# Patient Record
Sex: Male | Born: 1950
Health system: Southern US, Community
[De-identification: ages and names within clinical notes are randomized; demographics above are authoritative.]

## PROBLEM LIST (undated history)

## (undated) DIAGNOSIS — R918 Other nonspecific abnormal finding of lung field: Secondary | ICD-10-CM

## (undated) DIAGNOSIS — I10 Essential (primary) hypertension: Secondary | ICD-10-CM

## (undated) DIAGNOSIS — I5022 Chronic systolic (congestive) heart failure: Secondary | ICD-10-CM

## (undated) DIAGNOSIS — E8881 Metabolic syndrome: Secondary | ICD-10-CM

## (undated) DIAGNOSIS — I480 Paroxysmal atrial fibrillation: Secondary | ICD-10-CM

## (undated) DIAGNOSIS — C61 Malignant neoplasm of prostate: Secondary | ICD-10-CM

## (undated) DIAGNOSIS — Z9989 Dependence on other enabling machines and devices: Secondary | ICD-10-CM

## (undated) DIAGNOSIS — G4733 Obstructive sleep apnea (adult) (pediatric): Secondary | ICD-10-CM

## (undated) DIAGNOSIS — D696 Thrombocytopenia, unspecified: Secondary | ICD-10-CM

## (undated) DIAGNOSIS — I509 Heart failure, unspecified: Secondary | ICD-10-CM

## (undated) HISTORY — DX: Malignant neoplasm of prostate: C61

## (undated) HISTORY — DX: Metabolic syndrome: E88.81

## (undated) HISTORY — DX: Other nonspecific abnormal finding of lung field: R91.8

## (undated) HISTORY — DX: Thrombocytopenia, unspecified: D69.6

## (undated) HISTORY — DX: Chronic systolic (congestive) heart failure: I50.22

## (undated) HISTORY — DX: Paroxysmal atrial fibrillation: I48.0

## (undated) HISTORY — PX: PROSTATE BIOPSY: SHX241

---

## 2000-04-24 ENCOUNTER — Ambulatory Visit (HOSPITAL_BASED_OUTPATIENT_CLINIC_OR_DEPARTMENT_OTHER): Admission: RE | Admit: 2000-04-24 | Discharge: 2000-04-24 | Payer: Self-pay | Admitting: Nephrology

## 2002-06-20 ENCOUNTER — Ambulatory Visit (HOSPITAL_BASED_OUTPATIENT_CLINIC_OR_DEPARTMENT_OTHER): Admission: RE | Admit: 2002-06-20 | Discharge: 2002-06-20 | Payer: Self-pay | Admitting: Plastic Surgery

## 2015-04-15 ENCOUNTER — Ambulatory Visit (INDEPENDENT_AMBULATORY_CARE_PROVIDER_SITE_OTHER): Payer: 59 | Admitting: Interventional Cardiology

## 2015-04-15 ENCOUNTER — Encounter: Payer: Self-pay | Admitting: Interventional Cardiology

## 2015-04-15 VITALS — BP 100/68 | HR 66 | Ht 70.0 in | Wt 241.0 lb

## 2015-04-15 DIAGNOSIS — C61 Malignant neoplasm of prostate: Secondary | ICD-10-CM

## 2015-04-15 DIAGNOSIS — R918 Other nonspecific abnormal finding of lung field: Secondary | ICD-10-CM

## 2015-04-15 DIAGNOSIS — Z8249 Family history of ischemic heart disease and other diseases of the circulatory system: Secondary | ICD-10-CM | POA: Insufficient documentation

## 2015-04-15 DIAGNOSIS — I5042 Chronic combined systolic (congestive) and diastolic (congestive) heart failure: Secondary | ICD-10-CM | POA: Diagnosis not present

## 2015-04-15 DIAGNOSIS — I340 Nonrheumatic mitral (valve) insufficiency: Secondary | ICD-10-CM

## 2015-04-15 DIAGNOSIS — I5022 Chronic systolic (congestive) heart failure: Secondary | ICD-10-CM | POA: Diagnosis not present

## 2015-04-15 DIAGNOSIS — I509 Heart failure, unspecified: Secondary | ICD-10-CM | POA: Insufficient documentation

## 2015-04-15 DIAGNOSIS — I480 Paroxysmal atrial fibrillation: Secondary | ICD-10-CM | POA: Diagnosis not present

## 2015-04-15 DIAGNOSIS — E8881 Metabolic syndrome: Secondary | ICD-10-CM

## 2015-04-15 DIAGNOSIS — D696 Thrombocytopenia, unspecified: Secondary | ICD-10-CM

## 2015-04-15 DIAGNOSIS — G4733 Obstructive sleep apnea (adult) (pediatric): Secondary | ICD-10-CM

## 2015-04-15 HISTORY — DX: Chronic systolic (congestive) heart failure: I50.22

## 2015-04-15 HISTORY — DX: Metabolic syndrome: E88.81

## 2015-04-15 HISTORY — DX: Paroxysmal atrial fibrillation: I48.0

## 2015-04-15 HISTORY — DX: Other nonspecific abnormal finding of lung field: R91.8

## 2015-04-15 HISTORY — DX: Malignant neoplasm of prostate: C61

## 2015-04-15 HISTORY — DX: Thrombocytopenia, unspecified: D69.6

## 2015-04-15 NOTE — Patient Instructions (Addendum)
Medication Instructions:  Your physician recommends that you continue on your current medications as directed. Please refer to the Current Medication list given to you today.   Labwork: Your physician recommends that you return for lab work on 04/20/14 (Bmet, Cbc,Pt/Inr)  Testing/Procedures: Your physician has requested that you have a cardiac catheterization. Cardiac catheterization is used to diagnose and/or treat various heart conditions. Doctors may recommend this procedure for a number of different reasons. The most common reason is to evaluate chest pain. Chest pain can be a symptom of coronary artery disease (CAD), and cardiac catheterization can show whether plaque is narrowing or blocking your heart's arteries. This procedure is also used to evaluate the valves, as well as measure the blood flow and oxygen levels in different parts of your heart. For further information please visit [email or web address]. Please follow instruction sheet, as given.    Follow-Up: You have a follow up appointment scheduled on 05/22/15 @ 10am  Any Other Special Instructions Will Be Listed Below (If Applicable).     If you need a refill on your cardiac medications before your next appointment, please call your pharmacy.

## 2015-04-15 NOTE — Progress Notes (Signed)
Cardiology Office Note   Date:  04/15/2015   ID:  Logan [name], DOB Oct 10, 1950, MRN [id number]  PCP:  [name], MD  Cardiologist:  Sinclair Grooms, MD   Chief Complaint  Patient presents with  . Congestive Heart Failure      History of Present Illness: Logan [name] is a 65 y.o. male who presents for  Longitudinal follow-up of congestive heart failure and switch over from cardiologist at [facility]  In [place].   The patient gives a history of congestive heart failure since 2014 when he had his first episode of abdominal fullness and shortness of breath. Evaluation was done at that time and there was concern about prior unrecognized myocardial infarction. Medical therapy was started in the patient did well for a year and half until November 2016 when he again  Developed abdominal fullness and dyspnea on exertion. He was hospitalized at [facility].  Medical therapy was adjusted and he was placed on a diuretic , furosemide, and now feels back to normal. He is switching his follow-up because he had  Difficulties communicating with his prior cardiologist. He has never had a heart attack that he is aware of. Reviewing records from [place] demonstrated EF 25-30% with evidence by echo of inferior akinesis. A myocardial perfusion study in 2014 suggested that the anterior wall was akinetic an EF 25%.   He denies orthopnea, PND, exertional chest pain, syncope, palpitations, and transient neurological symptoms.   He is on Eliquis therapy and has a history of paroxysmal atrial fibrillation. He never recalls any episodes of tachycardia or palpitations. He has never had a cardioversion.  The available records do not clearly specify when atrial fibrillation recurred.    No past medical history on file.  No past surgical history on file.   Current Outpatient Prescriptions  Medication Sig Dispense Refill  . carvedilol (COREG) 12.5 MG tablet Take 12.5 mg by mouth 2  (two) times daily.    . Cholecalciferol ([employer]S VIT D 5000 HIGH-POTENCY) 5000 units capsule Take 5,000 Units by mouth daily.    . furosemide (LASIX) 40 MG tablet Take 40 mg by mouth daily.    . Multiple Vitamin (MULTIVITAMIN) tablet Take 1 tablet by mouth daily.    . rivaroxaban (XARELTO) 20 MG TABS tablet Take 20 mg by mouth daily.    . sacubitril-valsartan (ENTRESTO) 24-26 MG Take 1 tablet by mouth 2 (two) times daily.    Marland Kitchen spironolactone (ALDACTONE) 25 MG tablet Take 12.5 mg by mouth daily.     No current facility-administered medications for this visit.    Allergies:   Review of patient's allergies indicates no known allergies.    Social History:  The patient  reports that he quit smoking about 41 years ago. His smoking use included Cigarettes, Cigars, and Pipe. He has never used smokeless tobacco.   Family History:  The patient's family history includes Hypertension in his father; Parkinson's disease in his mother.    ROS:  Please see the history of present illness.   Otherwise, review of systems are positive for no major complaints other than distressed of his medical regimen. Frightened her have coronary angiogram performed. Also get off of Xarelto. Does not know the circumstances under which atrial fibrillation was diagnosed..   All other systems are reviewed and negative.    PHYSICAL EXAM: VS:  BP 100/68 mmHg  Pulse 66  Ht '5\' 10"'$  (1.778 m)  Wt 241 lb (109.317 kg)  BMI 34.58 kg/m2 , BMI  Body mass index is 34.58 kg/(m^2). GEN: Well nourished, well developed, in no acute distress HEENT: normal Neck: no JVD, carotid bruits, or masses Cardiac: RRR.  There is a 1-2 of 6 apical systolic murmur and an S4 gallop. There is no edema. Respiratory:  clear to auscultation bilaterally, normal work of breathing. GI: soft, nontender, nondistended, + BS MS: no deformity or atrophy Skin: warm and dry, no rash Neuro:  Strength and sensation are intact Psych: euthymic mood, full  affect   EKG:  EKG is ordered today. The ekg reveals NSR with of and V6. No old tracings are available for review.   Recent Labs: No results found for requested labs within last 365 days.    Lipid Panel No results found for: CHOL, TRIG, HDL, CHOLHDL, VLDL, LDLCALC, LDLDIRECT    Wt Readings from Last 3 Encounters:  04/15/15 241 lb (109.317 kg)      Other studies Reviewed: Additional studies/ records that were reviewed today include[place] The findings include confirmation of chronic systolic heart failure, initial presentation 2015, acute on chronic exacerbation in November 2016. At that time atrial fibrillation was also diagnosed. He did not have cardioversion. Anticoagulation therapy with Xarelto was started. Interest and was also started.. Catheterizations been recommended but always refused by the patient. Strong suggestion from records that he may have an ischemic cardiomyopathy as a nuclear study in 2015 demonstrated anterior wall scar. More recent echo suggested inferior wall akinesis.    ASSESSMENT AND PLAN:  1. Chronic systolic heart failure ([place]) Data from outlying hospital suggests the possibility of large anterolateral apical infarction/scar by nuclear scintigraphy in 2015 and a more recent echocardiogram suggesting inferior akinesis by echo. All studies demonstrate EF between 30 and 35%.  2. Paroxysmal atrial fibrillation ([place]) Diagnosed in November 2016 and anticoagulation therapy started. No strips to confirm.  3. Prostate cancer ([place]) Watchful waiting  4. Thrombocytopenia ([place]) Followed by oncology  5. Mitral regurgitation, myxomatous Diagnosed on echo  6. Lung mass Watchful waiting  7. Obstructive sleep apnea C Pap  8. Metabolic syndrome Being treated with diet and exercise  9. CHF (congestive heart failure), NYHA class I, chronic, combined ([place])  - EKG 64-QIHK - Basic metabolic panel; Future - CBC  w/Diff; Future - INR/PT; Future    Current medicines are reviewed at length with the patient today.  The patient has the following concerns regarding medicines: we discussed the patient medications and what the specific indications are for each cardioactive therapy. Entresto, carvedilol, , Xarelto, spironolactone,and furosemide were discussed.  The following changes/actions have been instituted:    Left and right heart catheterization with coronary angiography was recommended.The patient was counseled to undergo left heart catheterization, coronary angiography, and possible percutaneous coronary intervention with stent implantation. The procedural risks and benefits were discussed in detail. The risks discussed included death, stroke, myocardial infarction, life-threatening bleeding, limb ischemia, kidney injury, allergy, and possible emergency cardiac surgery. The risk of these significant complications were estimated to occur less than 1% of the time. After discussion, the patient has agreed to proceed.  If LVEF less than 35%, consideration of resynchronization therapy/AICD. Functional status is quite good as were probably not yet a candidate for device therapy.  Discuss prognosis after results of Her known.  Labs/ tests ordered today include:  No orders of the defined types were placed in this encounter.     Disposition:   FU with HS in 1 month  Signed, Sinclair Grooms, MD  04/15/2015  10:14 AM    [address], [place], [place]  [address] Phone: [phone]; Fax: [phone]

## 2015-04-16 ENCOUNTER — Other Ambulatory Visit: Payer: Self-pay | Admitting: Interventional Cardiology

## 2015-04-16 DIAGNOSIS — I5022 Chronic systolic (congestive) heart failure: Secondary | ICD-10-CM

## 2015-04-21 ENCOUNTER — Other Ambulatory Visit (INDEPENDENT_AMBULATORY_CARE_PROVIDER_SITE_OTHER): Payer: 59

## 2015-04-21 DIAGNOSIS — I5042 Chronic combined systolic (congestive) and diastolic (congestive) heart failure: Secondary | ICD-10-CM

## 2015-04-21 LAB — BASIC METABOLIC PANEL
BUN: 18 mg/dL (ref 7–25)
CALCIUM: 9.4 mg/dL (ref 8.6–10.3)
CO2: 24 mmol/L (ref 20–31)
CREATININE: 1.04 mg/dL (ref 0.70–1.25)
Chloride: 104 mmol/L (ref 98–110)
Glucose, Bld: 97 mg/dL (ref 65–99)
Potassium: 4.1 mmol/L (ref 3.5–5.3)
Sodium: 139 mmol/L (ref 135–146)

## 2015-04-21 LAB — CBC WITH DIFFERENTIAL/PLATELET
BASOS ABS: 0 10*3/uL (ref 0.0–0.1)
Basophils Relative: 0 % (ref 0–1)
EOS PCT: 0 % (ref 0–5)
Eosinophils Absolute: 0 10*3/uL (ref 0.0–0.7)
HEMATOCRIT: 39 % (ref 39.0–52.0)
Hemoglobin: 13.4 g/dL (ref 13.0–17.0)
Lymphocytes Relative: 55 % — ABNORMAL HIGH (ref 12–46)
Lymphs Abs: 2.9 10*3/uL (ref 0.7–4.0)
MCH: 30.3 pg (ref 26.0–34.0)
MCHC: 34.4 g/dL (ref 30.0–36.0)
MCV: 88.2 fL (ref 78.0–100.0)
MPV: 10.1 fL (ref 8.6–12.4)
Monocytes Absolute: 0.4 10*3/uL (ref 0.1–1.0)
Monocytes Relative: 7 % (ref 3–12)
NEUTROS PCT: 38 % — AB (ref 43–77)
Neutro Abs: 2 10*3/uL (ref 1.7–7.7)
PLATELETS: 137 10*3/uL — AB (ref 150–400)
RBC: 4.42 MIL/uL (ref 4.22–5.81)
RDW: 15.1 % (ref 11.5–15.5)
WBC: 5.3 10*3/uL (ref 4.0–10.5)

## 2015-04-22 LAB — PROTIME-INR
INR: 1.51 — ABNORMAL HIGH (ref <1.50)
Prothrombin Time: 18.4 seconds — ABNORMAL HIGH (ref 11.6–15.2)

## 2015-04-27 ENCOUNTER — Encounter (HOSPITAL_COMMUNITY)
Admission: RE | Disposition: A | Discharge status: Home / Self Care | Payer: Self-pay | Source: Ambulatory Visit | Attending: Interventional Cardiology

## 2015-04-27 ENCOUNTER — Ambulatory Visit (HOSPITAL_COMMUNITY)
Admission: RE | Admit: 2015-04-27 | Discharge: 2015-04-27 | Disposition: A | Discharge status: Home / Self Care | Payer: 59 | Source: Ambulatory Visit | Attending: Interventional Cardiology | Admitting: Interventional Cardiology

## 2015-04-27 ENCOUNTER — Encounter (HOSPITAL_COMMUNITY): Payer: Self-pay | Admitting: Interventional Cardiology

## 2015-04-27 DIAGNOSIS — I5022 Chronic systolic (congestive) heart failure: Secondary | ICD-10-CM | POA: Diagnosis present

## 2015-04-27 DIAGNOSIS — I255 Ischemic cardiomyopathy: Secondary | ICD-10-CM | POA: Insufficient documentation

## 2015-04-27 DIAGNOSIS — Z87891 Personal history of nicotine dependence: Secondary | ICD-10-CM | POA: Insufficient documentation

## 2015-04-27 DIAGNOSIS — R9439 Abnormal result of other cardiovascular function study: Secondary | ICD-10-CM

## 2015-04-27 DIAGNOSIS — I2510 Atherosclerotic heart disease of native coronary artery without angina pectoris: Secondary | ICD-10-CM | POA: Diagnosis not present

## 2015-04-27 DIAGNOSIS — E8881 Metabolic syndrome: Secondary | ICD-10-CM | POA: Diagnosis not present

## 2015-04-27 DIAGNOSIS — C61 Malignant neoplasm of prostate: Secondary | ICD-10-CM | POA: Insufficient documentation

## 2015-04-27 DIAGNOSIS — R918 Other nonspecific abnormal finding of lung field: Secondary | ICD-10-CM | POA: Insufficient documentation

## 2015-04-27 DIAGNOSIS — Z8249 Family history of ischemic heart disease and other diseases of the circulatory system: Secondary | ICD-10-CM | POA: Diagnosis not present

## 2015-04-27 DIAGNOSIS — I480 Paroxysmal atrial fibrillation: Secondary | ICD-10-CM | POA: Diagnosis not present

## 2015-04-27 DIAGNOSIS — Z7901 Long term (current) use of anticoagulants: Secondary | ICD-10-CM | POA: Insufficient documentation

## 2015-04-27 DIAGNOSIS — I252 Old myocardial infarction: Secondary | ICD-10-CM | POA: Diagnosis not present

## 2015-04-27 DIAGNOSIS — D696 Thrombocytopenia, unspecified: Secondary | ICD-10-CM | POA: Insufficient documentation

## 2015-04-27 DIAGNOSIS — I340 Nonrheumatic mitral (valve) insufficiency: Secondary | ICD-10-CM | POA: Diagnosis present

## 2015-04-27 DIAGNOSIS — R931 Abnormal findings on diagnostic imaging of heart and coronary circulation: Secondary | ICD-10-CM | POA: Diagnosis not present

## 2015-04-27 DIAGNOSIS — G4733 Obstructive sleep apnea (adult) (pediatric): Secondary | ICD-10-CM | POA: Insufficient documentation

## 2015-04-27 HISTORY — PX: CARDIAC CATHETERIZATION: SHX172

## 2015-04-27 LAB — POCT I-STAT 3, ART BLOOD GAS (G3+)
Acid-base deficit: 3 mmol/L — ABNORMAL HIGH (ref 0.0–2.0)
BICARBONATE: 22.8 meq/L (ref 20.0–24.0)
O2 Saturation: 98 %
PCO2 ART: 42.9 mmHg (ref 35.0–45.0)
PH ART: 7.334 — AB (ref 7.350–7.450)
TCO2: 24 mmol/L (ref 0–100)
pO2, Arterial: 119 mmHg — ABNORMAL HIGH (ref 80.0–100.0)

## 2015-04-27 LAB — POCT I-STAT 3, VENOUS BLOOD GAS (G3P V)
ACID-BASE DEFICIT: 2 mmol/L (ref 0.0–2.0)
BICARBONATE: 23.7 meq/L (ref 20.0–24.0)
O2 SAT: 74 %
PO2 VEN: 41 mmHg (ref 30.0–45.0)
TCO2: 25 mmol/L (ref 0–100)
pCO2, Ven: 43.3 mmHg — ABNORMAL LOW (ref 45.0–50.0)
pH, Ven: 7.347 — ABNORMAL HIGH (ref 7.250–7.300)

## 2015-04-27 SURGERY — RIGHT/LEFT HEART CATH AND CORONARY ANGIOGRAPHY
Anesthesia: LOCAL

## 2015-04-27 MED ORDER — ASPIRIN 81 MG PO CHEW: 81.0000 mg | CHEWABLE_TABLET | Freq: Every day | ORAL | Status: DC

## 2015-04-27 MED ORDER — FENTANYL CITRATE (PF) 100 MCG/2ML IJ SOLN
INTRAMUSCULAR | Status: AC
Start: 2015-04-27 — End: 2015-04-27
  Filled 2015-04-27: qty 2

## 2015-04-27 MED ORDER — MIDAZOLAM HCL 2 MG/2ML IJ SOLN
INTRAMUSCULAR | Status: AC
  Filled 2015-04-27: qty 2

## 2015-04-27 MED ORDER — OXYCODONE-ACETAMINOPHEN 5-325 MG PO TABS: 1.0000 | ORAL_TABLET | ORAL | Status: DC | PRN

## 2015-04-27 MED ORDER — SODIUM CHLORIDE 0.9 % IV SOLN
INTRAVENOUS | Status: DC
  Administered 2015-04-27: 07:00:00 via INTRAVENOUS

## 2015-04-27 MED ORDER — SODIUM CHLORIDE 0.9 % IV SOLN: 250.0000 mL | INTRAVENOUS | Status: DC | PRN

## 2015-04-27 MED ORDER — ACETAMINOPHEN 325 MG PO TABS: 650.0000 mg | ORAL_TABLET | ORAL | Status: DC | PRN

## 2015-04-27 MED ORDER — FENTANYL CITRATE (PF) 100 MCG/2ML IJ SOLN
INTRAMUSCULAR | Status: DC | PRN
  Administered 2015-04-27 (×2): 25 ug via INTRAVENOUS

## 2015-04-27 MED ORDER — HEPARIN SODIUM (PORCINE) 1000 UNIT/ML IJ SOLN
INTRAMUSCULAR | Status: DC | PRN
  Administered 2015-04-27: 5000 [IU] via INTRAVENOUS

## 2015-04-27 MED ORDER — SODIUM CHLORIDE 0.9% FLUSH: 3.0000 mL | INTRAVENOUS | Status: DC | PRN

## 2015-04-27 MED ORDER — LIDOCAINE HCL (PF) 1 % IJ SOLN
INTRAMUSCULAR | Status: DC | PRN
  Administered 2015-04-27: 08:00:00

## 2015-04-27 MED ORDER — HEPARIN (PORCINE) IN NACL 2-0.9 UNIT/ML-% IJ SOLN
INTRAMUSCULAR | Status: AC
  Filled 2015-04-27: qty 1000

## 2015-04-27 MED ORDER — MIDAZOLAM HCL 2 MG/2ML IJ SOLN
INTRAMUSCULAR | Status: DC | PRN
  Administered 2015-04-27 (×2): 1 mg via INTRAVENOUS

## 2015-04-27 MED ORDER — ONDANSETRON HCL 4 MG/2ML IJ SOLN: 4.0000 mg | Freq: Four times a day (QID) | INTRAMUSCULAR | Status: DC | PRN

## 2015-04-27 MED ORDER — SODIUM CHLORIDE 0.9% FLUSH: 3.0000 mL | Freq: Two times a day (BID) | INTRAVENOUS | Status: DC

## 2015-04-27 MED ORDER — IOHEXOL 350 MG/ML SOLN
INTRAVENOUS | Status: DC | PRN
  Administered 2015-04-27: 80 mL via INTRA_ARTERIAL

## 2015-04-27 MED ORDER — ASPIRIN 81 MG PO CHEW
CHEWABLE_TABLET | ORAL | Status: AC
  Filled 2015-04-27: qty 1

## 2015-04-27 MED ORDER — VERAPAMIL HCL 2.5 MG/ML IV SOLN: INTRAVENOUS | Status: DC | PRN

## 2015-04-27 MED ORDER — VERAPAMIL HCL 2.5 MG/ML IV SOLN
INTRAVENOUS | Status: AC
  Filled 2015-04-27: qty 2

## 2015-04-27 MED ORDER — ASPIRIN 81 MG PO CHEW
81.0000 mg | CHEWABLE_TABLET | ORAL | Status: AC
  Administered 2015-04-27: 81 mg via ORAL

## 2015-04-27 MED ORDER — LIDOCAINE HCL (PF) 1 % IJ SOLN
INTRAMUSCULAR | Status: AC
  Filled 2015-04-27: qty 30

## 2015-04-27 MED ORDER — SODIUM CHLORIDE 0.9 % WEIGHT BASED INFUSION: 1.0000 mL/kg/h | INTRAVENOUS | Status: AC

## 2015-04-27 SURGICAL SUPPLY — 13 items
CATH BALLN WEDGE 5F 110CM (CATHETERS) ×2 IMPLANT
CATH INFINITI 5 FR JL3.5 (CATHETERS) ×2 IMPLANT
CATH INFINITI JR4 5F (CATHETERS) ×2 IMPLANT
DEVICE RAD COMP TR BAND LRG (VASCULAR PRODUCTS) ×2 IMPLANT
GLIDESHEATH SLEND A-KIT 6F 22G (SHEATH) ×2 IMPLANT
GUIDEWIRE .025 260CM (WIRE) ×2 IMPLANT
KIT HEART LEFT (KITS) ×2 IMPLANT
KIT HEART RIGHT NAMIC (KITS) ×2 IMPLANT
PACK CARDIAC CATHETERIZATION (CUSTOM PROCEDURE TRAY) ×2 IMPLANT
SHEATH FAST CATH BRACH 5F 5CM (SHEATH) ×2 IMPLANT
TRANSDUCER W/STOPCOCK (MISCELLANEOUS) ×2 IMPLANT
TUBING CIL FLEX 10 FLL-RA (TUBING) ×2 IMPLANT
WIRE SAFE-T 1.5MM-J .035X260CM (WIRE) ×2 IMPLANT

## 2015-04-27 NOTE — Discharge Instructions (Signed)
Radial Site Care °Refer to this sheet in the next few weeks. These instructions provide you with information about caring for yourself after your procedure. Your health care provider may also give you more specific instructions. Your treatment has been planned according to current medical practices, but problems sometimes occur. Call your health care provider if you have any problems or questions after your procedure. °WHAT TO EXPECT AFTER THE PROCEDURE °After your procedure, it is typical to have the following: °· Bruising at the radial site that usually fades within 1-2 weeks. °· Blood collecting in the tissue (hematoma) that may be painful to the touch. It should usually decrease in size and tenderness within 1-2 weeks. °HOME CARE INSTRUCTIONS °· Take medicines only as directed by your health care provider. °· You may shower 24-48 hours after the procedure or as directed by your health care provider. Remove the bandage (dressing) and gently wash the site with plain soap and water. Pat the area dry with a clean towel. Do not rub the site, because this may cause bleeding. °· Do not take baths, swim, or use a hot tub until your health care provider approves. °· Check your insertion site every day for redness, swelling, or drainage. °· Do not apply powder or lotion to the site. °· Do not flex or bend the affected arm for 24 hours or as directed by your health care provider. °· Do not push or pull heavy objects with the affected arm for 24 hours or as directed by your health care provider. °· Do not lift over 10 lb (4.5 kg) for 5 days after your procedure or as directed by your health care provider. °· Ask your health care provider when it is okay to: °¨ Return to work or school. °¨ Resume usual physical activities or sports. °¨ Resume sexual activity. °· Do not drive home if you are discharged the same day as the procedure. Have someone else drive you. °· You may drive 24 hours after the procedure unless otherwise  instructed by your health care provider. °· Do not operate machinery or power tools for 24 hours after the procedure. °· If your procedure was done as an outpatient procedure, which means that you went home the same day as your procedure, a responsible adult should be with you for the first 24 hours after you arrive home. °· Keep all follow-up visits as directed by your health care provider. This is important. °SEEK MEDICAL CARE IF: °· You have a fever. °· You have chills. °· You have increased bleeding from the radial site. Hold pressure on the site. CALL 911 °SEEK IMMEDIATE MEDICAL CARE IF: °· You have unusual pain at the radial site. °· You have redness, warmth, or swelling at the radial site. °· You have drainage (other than a small amount of blood on the dressing) from the radial site. °· The radial site is bleeding, and the bleeding does not stop after 30 minutes of holding steady pressure on the site. °· Your arm or hand becomes pale, cool, tingly, or numb. °  °This information is not intended to replace advice given to you by your health care provider. Make sure you discuss any questions you have with your health care provider. °  °Document Released: 04/02/2010 Document Revised: 03/21/2014 Document Reviewed: 09/16/2013 °Elsevier Interactive Patient Education ©2016 Elsevier Inc. ° °

## 2015-04-27 NOTE — H&P (View-Only) (Signed)
Cardiology Office Note   Date:  04/15/2015   ID:  Logan [name], DOB [birth year], MRN [id number]  PCP:  [name], MD  Cardiologist:  Sinclair Grooms, MD   Chief Complaint  Patient presents with  . Congestive Heart Failure      History of Present Illness: Logan [name] is a 65 y.o. male who presents for  Longitudinal follow-up of congestive heart failure and switch over from cardiologist at [facility]  In [place].   The patient gives a history of congestive heart failure since 2014 when he had his first episode of abdominal fullness and shortness of breath. Evaluation was done at that time and there was concern about prior unrecognized myocardial infarction. Medical therapy was started in the patient did well for a year and half until November 2016 when he again  Developed abdominal fullness and dyspnea on exertion. He was hospitalized at [facility].  Medical therapy was adjusted and he was placed on a diuretic , furosemide, and now feels back to normal. He is switching his follow-up because he had  Difficulties communicating with his prior cardiologist. He has never had a heart attack that he is aware of. Reviewing records from [place] demonstrated EF 25-30% with evidence by echo of inferior akinesis. A myocardial perfusion study in 2014 suggested that the anterior wall was akinetic an EF 25%.   He denies orthopnea, PND, exertional chest pain, syncope, palpitations, and transient neurological symptoms.   He is on Eliquis therapy and has a history of paroxysmal atrial fibrillation. He never recalls any episodes of tachycardia or palpitations. He has never had a cardioversion.  The available records do not clearly specify when atrial fibrillation recurred.    No past medical history on file.  No past surgical history on file.   Current Outpatient Prescriptions  Medication Sig Dispense Refill  . carvedilol (COREG) 12.5 MG tablet Take 12.5 mg by mouth 2  (two) times daily.    . Cholecalciferol (CVS VIT D 5000 HIGH-POTENCY) 5000 units capsule Take 5,000 Units by mouth daily.    . furosemide (LASIX) 40 MG tablet Take 40 mg by mouth daily.    . Multiple Vitamin (MULTIVITAMIN) tablet Take 1 tablet by mouth daily.    . rivaroxaban (XARELTO) 20 MG TABS tablet Take 20 mg by mouth daily.    . sacubitril-valsartan (ENTRESTO) 24-26 MG Take 1 tablet by mouth 2 (two) times daily.    Marland Kitchen spironolactone (ALDACTONE) 25 MG tablet Take 12.5 mg by mouth daily.     No current facility-administered medications for this visit.    Allergies:   Review of patient's allergies indicates no known allergies.    Social History:  The patient  reports that he quit smoking about 41 years ago. His smoking use included Cigarettes, Cigars, and Pipe. He has never used smokeless tobacco.   Family History:  The patient's family history includes Hypertension in his father; Parkinson's disease in his mother.    ROS:  Please see the history of present illness.   Otherwise, review of systems are positive for no major complaints other than distressed of his medical regimen. Frightened her have coronary angiogram performed. Also get off of Xarelto. Does not know the circumstances under which atrial fibrillation was diagnosed..   All other systems are reviewed and negative.    PHYSICAL EXAM: VS:  BP 100/68 mmHg  Pulse 66  Ht '5\' 10"'$  (1.778 m)  Wt 241 lb (109.317 kg)  BMI 34.58 kg/m2 , BMI  Body mass index is 34.58 kg/(m^2). GEN: Well nourished, well developed, in no acute distress HEENT: normal Neck: no JVD, carotid bruits, or masses Cardiac: RRR.  There is a 1-2 of 6 apical systolic murmur and an S4 gallop. There is no edema. Respiratory:  clear to auscultation bilaterally, normal work of breathing. GI: soft, nontender, nondistended, + BS MS: no deformity or atrophy Skin: warm and dry, no rash Neuro:  Strength and sensation are intact Psych: euthymic mood, full  affect   EKG:  EKG is ordered today. The ekg reveals NSR with of and V6. No old tracings are available for review.   Recent Labs: No results found for requested labs within last 365 days.    Lipid Panel No results found for: CHOL, TRIG, HDL, CHOLHDL, VLDL, LDLCALC, LDLDIRECT    Wt Readings from Last 3 Encounters:  04/15/15 241 lb (109.317 kg)      Other studies Reviewed: Additional studies/ records that were reviewed today include[place] The findings include confirmation of chronic systolic heart failure, initial presentation 2015, acute on chronic exacerbation in November 2016. At that time atrial fibrillation was also diagnosed. He did not have cardioversion. Anticoagulation therapy with Xarelto was started. Interest and was also started.. Catheterizations been recommended but always refused by the patient. Strong suggestion from records that he may have an ischemic cardiomyopathy as a nuclear study in 2015 demonstrated anterior wall scar. More recent echo suggested inferior wall akinesis.    ASSESSMENT AND PLAN:  1. Chronic systolic heart failure ([place]) Data from outlying hospital suggests the possibility of large anterolateral apical infarction/scar by nuclear scintigraphy in 2015 and a more recent echocardiogram suggesting inferior akinesis by echo. All studies demonstrate EF between 30 and 35%.  2. Paroxysmal atrial fibrillation ([place]) Diagnosed in November 2016 and anticoagulation therapy started. No strips to confirm.  3. Prostate cancer ([place]) Watchful waiting  4. Thrombocytopenia ([place]) Followed by oncology  5. Mitral regurgitation, myxomatous Diagnosed on echo  6. Lung mass Watchful waiting  7. Obstructive sleep apnea C Pap  8. Metabolic syndrome Being treated with diet and exercise  9. CHF (congestive heart failure), NYHA class I, chronic, combined ([place])  - EKG 88-CZYS - Basic metabolic panel; Future - CBC  w/Diff; Future - INR/PT; Future    Current medicines are reviewed at length with the patient today.  The patient has the following concerns regarding medicines: we discussed the patient medications and what the specific indications are for each cardioactive therapy. Entresto, carvedilol, , Xarelto, spironolactone,and furosemide were discussed.  The following changes/actions have been instituted:    Left and right heart catheterization with coronary angiography was recommended.The patient was counseled to undergo left heart catheterization, coronary angiography, and possible percutaneous coronary intervention with stent implantation. The procedural risks and benefits were discussed in detail. The risks discussed included death, stroke, myocardial infarction, life-threatening bleeding, limb ischemia, kidney injury, allergy, and possible emergency cardiac surgery. The risk of these significant complications were estimated to occur less than 1% of the time. After discussion, the patient has agreed to proceed.  If LVEF less than 35%, consideration of resynchronization therapy/AICD. Functional status is quite good as were probably not yet a candidate for device therapy.  Discuss prognosis after results of Her known.  Labs/ tests ordered today include:  No orders of the defined types were placed in this encounter.     Disposition:   FU with HS in 1 month  Signed, Sinclair Grooms, MD  04/15/2015  10:14 AM    [address] [place], [place],   [address] Phone: [phone]; Fax: [phone]

## 2015-04-27 NOTE — Interval H&P Note (Signed)
Cath Lab Visit (complete for each Cath Lab visit)  Clinical Evaluation Leading to the Procedure:   ACS: No.  Non-ACS:    Anginal Classification: CCS III  Anti-ischemic medical therapy: Maximal Therapy (2 or more classes of medications)  Non-Invasive Test Results: High-risk stress test findings: cardiac mortality >3%/year  Prior CABG: No previous CABG      History and Physical Interval Note:  04/27/2015 7:16 AM  Logan [name]  has presented today for surgery, with the diagnosis of ischemic cardiomyopathy  The various methods of treatment have been discussed with the patient and family. After consideration of risks, benefits and other options for treatment, the patient has consented to  Procedure(s): Right/Left Heart Cath and Coronary Angiography (N/A) as a surgical intervention .  The patient's history has been reviewed, patient examined, no change in status, stable for surgery.  I have reviewed the patient's chart and labs.  Questions were answered to the patient's satisfaction.     Sinclair Grooms

## 2015-04-28 MED FILL — Verapamil HCl IV Soln 2.5 MG/ML: INTRAVENOUS | Qty: 2 | Status: AC

## 2015-05-04 ENCOUNTER — Telehealth: Payer: Self-pay | Admitting: Interventional Cardiology

## 2015-05-04 MED ORDER — SACUBITRIL-VALSARTAN 24-26 MG PO TABS: 1.0000 | ORAL_TABLET | Freq: Two times a day (BID) | ORAL | Status: DC

## 2015-05-04 NOTE — Telephone Encounter (Signed)
New message     Pt wife calling for RN but she educated to me that she needs samples: he also needs prior Authorization for medication  Patient calling the office for samples of medication:   1.  What medication and dosage are you requesting samples for? Entresto 24-'26mg'$    2.  Are you currently out of this medication? yes

## 2015-05-04 NOTE — Telephone Encounter (Signed)
Rx sent to pt pharmacy for [facility]

## 2015-05-06 ENCOUNTER — Telehealth: Payer: Self-pay

## 2015-05-06 NOTE — Telephone Encounter (Signed)
After speaking with patient's wife, I found out it is not a PA he needs. They are already enrolled in [facility], but can't get the medication. Spoke with a rep at [employer], who told me they were having a problem processing the cards. It is an IT problem, and they are working to resolve it. Provided patient with 2 boxes of samples of Entresto 24-26.

## 2015-05-15 ENCOUNTER — Telehealth: Payer: Self-pay | Admitting: Interventional Cardiology

## 2015-05-15 NOTE — Telephone Encounter (Signed)
New message      Calling to get prior authorization for his enstresto

## 2015-05-18 NOTE — Telephone Encounter (Signed)
Not needed. He is enrolled in [facility].

## 2015-05-22 ENCOUNTER — Encounter: Payer: Self-pay | Admitting: Interventional Cardiology

## 2015-05-22 ENCOUNTER — Ambulatory Visit (INDEPENDENT_AMBULATORY_CARE_PROVIDER_SITE_OTHER): Payer: 59 | Admitting: Interventional Cardiology

## 2015-05-22 VITALS — BP 90/60 | HR 66 | Ht 70.0 in | Wt 240.0 lb

## 2015-05-22 DIAGNOSIS — I5022 Chronic systolic (congestive) heart failure: Secondary | ICD-10-CM | POA: Diagnosis not present

## 2015-05-22 DIAGNOSIS — C61 Malignant neoplasm of prostate: Secondary | ICD-10-CM

## 2015-05-22 DIAGNOSIS — I429 Cardiomyopathy, unspecified: Secondary | ICD-10-CM

## 2015-05-22 DIAGNOSIS — I480 Paroxysmal atrial fibrillation: Secondary | ICD-10-CM | POA: Diagnosis not present

## 2015-05-22 DIAGNOSIS — I428 Other cardiomyopathies: Secondary | ICD-10-CM

## 2015-05-22 DIAGNOSIS — Z7901 Long term (current) use of anticoagulants: Secondary | ICD-10-CM | POA: Insufficient documentation

## 2015-05-22 NOTE — Progress Notes (Signed)
Cardiology Office Note   Date:  05/22/2015   ID:  Logan [name], DOB 07-01-1950, MRN [id number]  PCP:  [name], MD  Cardiologist:  Sinclair Grooms, MD   Chief Complaint  Patient presents with  . Congestive Heart Failure      History of Present Illness: Logan [name] is a 65 y.o. male who presents for Nonischemic cardiomyopathy, LVEF 25-30%, elevated LVEDP, elevated troponin, paroxysmal atrial fibrillation, and chronic anticoagulation therapy.  Rider had coronary angiography and has widely patent vessels. There been the question of whether cardiomyopathy was related to ischemic heart disease but it is not. Since his decompensation in November, he has done well on the current medical regimen. He denies orthopnea. There has been some weight gain. He has no peripheral edema. He denies chest pain. No palpitations or syncope. No bleeding on Eliquis.  Past Medical History  Diagnosis Date  . Chronic systolic heart failure ([place]) 04/15/2015     EF 30-35% by echo 2016 , [facility]   . Lung mass 04/15/2015     Never biopsied. History of PET scan that raised concern.   . Obstructive sleep apnea 04/15/2015  . Paroxysmal atrial fibrillation ([place]) 04/15/2015    On apixaban   . Prostate cancer ([place]) 04/15/2015  . Thrombocytopenia ([place]) 04/15/2015  . Metabolic syndrome 10/12/173    Past Surgical History  Procedure Laterality Date  . Prostate biopsy    . Cardiac catheterization N/A 04/27/2015    Procedure: Right/Left Heart Cath and Coronary Angiography;  Surgeon: [name], MD;  Location: [facility];  Service: Cardiovascular;  Laterality: N/A;     Current Outpatient Prescriptions  Medication Sig Dispense Refill  . aspirin 81 MG tablet Take 81 mg by mouth daily.    . carvedilol (COREG) 12.5 MG tablet Take 12.5 mg by mouth 2 (two) times daily.    . Cholecalciferol (CVS VIT D 5000 HIGH-POTENCY) 5000 units capsule Take 5,000 Units by mouth daily.    . furosemide (LASIX)  40 MG tablet Take 40 mg by mouth daily.    . Multiple Vitamin (MULTIVITAMIN) tablet Take 1 tablet by mouth daily.    . rivaroxaban (XARELTO) 20 MG TABS tablet Take 20 mg by mouth daily.    . sacubitril-valsartan (ENTRESTO) 24-26 MG Take 1 tablet by mouth 2 (two) times daily. 60 tablet 0  . spironolactone (ALDACTONE) 12.5 mg TABS tablet Take 12.5 mg by mouth daily.     No current facility-administered medications for this visit.    Allergies:   Review of patient's allergies indicates no known allergies.    Social History:  The patient  reports that he quit smoking about 41 years ago. His smoking use included Cigarettes, Cigars, and Pipe. He has never used smokeless tobacco.   Family History:  The patient's family history includes Hypertension in his father; Parkinson's disease in his mother.    ROS:  Please see the history of present illness.   Otherwise, review of systems are positive for erectile dysfunction.   All other systems are reviewed and negative.    PHYSICAL EXAM: VS:  BP 90/60 mmHg  Pulse 66  Ht '5\' 10"'$  (1.778 m)  Wt 240 lb (108.863 kg)  BMI 34.44 kg/m2 , BMI Body mass index is 34.44 kg/(m^2). GEN: Well nourished, well developed, in no acute distress HEENT: normal Neck: no JVD, carotid bruits, or masses Cardiac: RRR.  There is no murmur, rub, or gallop. There is no edema. Respiratory:  clear  to auscultation bilaterally, normal work of breathing. GI: soft, nontender, nondistended, + BS MS: no deformity or atrophy Skin: warm and dry, no rash Neuro:  Strength and sensation are intact Psych: euthymic mood, full affect   EKG:  EKG is not ordered today.   Recent Labs: 04/21/2015: BUN 18; Creat 1.04; Hemoglobin 13.4; Platelets 137*; Potassium 4.1; Sodium 139    Lipid Panel No results found for: CHOL, TRIG, HDL, CHOLHDL, VLDL, LDLCALC, LDLDIRECT    Wt Readings from Last 3 Encounters:  05/22/15 240 lb (108.863 kg)  04/27/15 232 lb (105.235 kg)  04/15/15 241 lb  (109.317 kg)      Other studies Reviewed: Additional studies/ records that were reviewed today include: Discussed the implications of recent cardiac catheterization with the patient.. The findings include nonischemic cardiomyopathy and final conclusions noted below:. Left and right heart cath with coronary angiography: Conclusion    1. Mid LAD to Dist LAD lesion, 20% stenosed. 2. Mid RCA to Dist RCA lesion, 20% stenosed. 3. There is moderate to severe left ventricular systolic dysfunction.   Widely patent coronary arteries  Severe global hypokinesis with EF 25%. Elevated left ventricular end-diastolic pressure  Normal pulmonary artery pressures.  RECOMMENDATIONS:   No evidence of coronary artery disease  Aggressive medical therapy for left ventricular systolic dysfunction.      ASSESSMENT AND PLAN:  1. Chronic systolic congestive heart failure (HCC) Stable without evidence of volume excess.  2. Paroxysmal atrial fibrillation (HCC) Asymptomatic.  3. Chronic anticoagulation On therapy with Eliquis  4. Nonischemic cardiomyopathy Recent cath with widely patent coronaries  5. Prostate cancer ([place]) Elevated PSA which he is choosing to follow clinically rather than have active therapy.    Current medicines are reviewed at length with the patient today.  The patient has the following concerns regarding medicines: None other than the desire to decrease the number of agents that he is required to take.  The following changes/actions have been instituted:    EP evaluation to consider AICD  Advanced heart failure clinic evaluation to widen the scope of his management for future considerations.  Advised the patient to limit sodium intake  Advised the patient to protest patent moderate aerobic activity 3-4 times per week for at least 20 minutes perception  Advised the patient to wait daily under the same circumstances and track data with a call if sudden 3-5 pound  increase in weight.  Remove aspirin from medication list.  Labs/ tests ordered today include:  No orders of the defined types were placed in this encounter.     Disposition:   FU with HS in 6 months  Signed, Sinclair Grooms, MD  05/22/2015 10:30 AM    [facility], [place], [place]  [address] Phone: [phone]; Fax: [phone]

## 2015-05-22 NOTE — Patient Instructions (Signed)
Medication Instructions:  Your physician recommends that you continue on your current medications as directed. Please refer to the Current Medication list given to you today.   Labwork: NONE ORDERED  Testing/Procedures: NONE ORDERED  Follow-Up:  Your physician recommends that you schedule a follow-up appointment in: 1-2 MONTHS WITH EP TO DISCUSS POSSIBLE DIFIBILATOR  Your physician recommends that you schedule a follow-up appointment in: 2-3 MONTHS TO Jane Your physician wants you to follow-up in: [place] DR. Gaspar Bidding will receive a reminder letter in the mail two months in advance. If you don't receive a letter, please call our office to schedule the follow-up appointment.     Any Other Special Instructions Will Be Listed Below (If Applicable).     If you need a refill on your cardiac medications before your next appointment, please call your pharmacy.

## 2015-06-08 ENCOUNTER — Telehealth: Payer: Self-pay | Admitting: Interventional Cardiology

## 2015-06-08 NOTE — Telephone Encounter (Signed)
New Message:    She said to let you know he is having problems with the Entresto.

## 2015-06-09 NOTE — Telephone Encounter (Signed)
Patient is still having a problem getting Entresto at local pharmacy through [facility]. 3 bottles of Entresto 24-26 provided to him. I will call [facility] to see why cards are not processing.

## 2015-12-08 ENCOUNTER — Encounter: Payer: Self-pay | Admitting: Interventional Cardiology

## 2015-12-09 ENCOUNTER — Encounter (INDEPENDENT_AMBULATORY_CARE_PROVIDER_SITE_OTHER): Payer: Self-pay

## 2015-12-09 ENCOUNTER — Encounter: Payer: Self-pay | Admitting: Interventional Cardiology

## 2015-12-09 ENCOUNTER — Ambulatory Visit (INDEPENDENT_AMBULATORY_CARE_PROVIDER_SITE_OTHER): Payer: 59 | Admitting: Interventional Cardiology

## 2015-12-09 VITALS — BP 94/56 | HR 78 | Ht 71.0 in | Wt 235.6 lb

## 2015-12-09 DIAGNOSIS — I5022 Chronic systolic (congestive) heart failure: Secondary | ICD-10-CM

## 2015-12-09 DIAGNOSIS — I428 Other cardiomyopathies: Secondary | ICD-10-CM

## 2015-12-09 DIAGNOSIS — G4733 Obstructive sleep apnea (adult) (pediatric): Secondary | ICD-10-CM | POA: Diagnosis not present

## 2015-12-09 DIAGNOSIS — I429 Cardiomyopathy, unspecified: Secondary | ICD-10-CM | POA: Diagnosis not present

## 2015-12-09 DIAGNOSIS — Z7901 Long term (current) use of anticoagulants: Secondary | ICD-10-CM

## 2015-12-09 DIAGNOSIS — I480 Paroxysmal atrial fibrillation: Secondary | ICD-10-CM | POA: Diagnosis not present

## 2015-12-09 MED ORDER — SACUBITRIL-VALSARTAN 24-26 MG PO TABS: 1.0000 | ORAL_TABLET | Freq: Two times a day (BID) | ORAL | 3 refills | Status: DC

## 2015-12-09 MED ORDER — CARVEDILOL 12.5 MG PO TABS: 12.5000 mg | ORAL_TABLET | Freq: Two times a day (BID) | ORAL | 3 refills | Status: DC

## 2015-12-09 MED ORDER — FUROSEMIDE 40 MG PO TABS: 40.0000 mg | ORAL_TABLET | Freq: Every day | ORAL | 3 refills | Status: DC

## 2015-12-09 NOTE — Progress Notes (Signed)
Cardiology Office Note    Date:  12/09/2015   ID:  Logan [name], DOB 1950/10/28, MRN [id number]  PCP:  [name], MD  Cardiologist: Sinclair Grooms, MD   Chief Complaint  Patient presents with  . Congestive Heart Failure    History of Present Illness:  Logan [name] is a 65 y.o. male male who presents for Nonischemic cardiomyopathy, LVEF 25-30%, elevated LVEDP, elevated troponin, paroxysmal atrial fibrillation, chronic anticoagulation therapy, lung mass, and elevated PSA.  Logan [name] seems to be doing well. His appetite is stable. He has not had syncope, back pain, chest pain, orthopnea, or PND. On Xarelto he had blood-tinged sputum and discontinue the medication. He did not inform me of this.  He has history of a lung mass on chest x-ray that he has not allowed workup. He is also known to have an elevated PSA that he has not allowed to be evaluated.    Past Medical History:  Diagnosis Date  . Chronic systolic heart failure ([place]) 04/15/2015    EF 30-35% by echo 2016 , [facility]   . Lung mass 04/15/2015    Never biopsied. History of PET scan that raised concern.   . Metabolic syndrome 10/14/5051  . Obstructive sleep apnea 04/15/2015  . Paroxysmal atrial fibrillation ([place]) 04/15/2015   On apixaban   . Prostate cancer ([place]) 04/15/2015  . Thrombocytopenia ([place]) 04/15/2015    Past Surgical History:  Procedure Laterality Date  . CARDIAC CATHETERIZATION N/A 04/27/2015   Procedure: Right/Left Heart Cath and Coronary Angiography;  Surgeon: [name], MD;  Location: [facility];  Service: Cardiovascular;  Laterality: N/A;  . PROSTATE BIOPSY      Current Medications: Outpatient Medications Prior to Visit  Medication Sig Dispense Refill  . aspirin 81 MG tablet Take 81 mg by mouth daily.    . Cholecalciferol (CVS VIT D 5000 HIGH-POTENCY) 5000 units capsule Take 5,000 Units by mouth daily.    . Multiple Vitamin (MULTIVITAMIN) tablet Take 1 tablet by mouth daily.    .  carvedilol (COREG) 12.5 MG tablet Take 12.5 mg by mouth 2 (two) times daily.    . furosemide (LASIX) 40 MG tablet Take 40 mg by mouth daily.    . rivaroxaban (XARELTO) 20 MG TABS tablet Take 20 mg by mouth daily.    . sacubitril-valsartan (ENTRESTO) 24-26 MG Take 1 tablet by mouth 2 (two) times daily. 60 tablet 0  . spironolactone (ALDACTONE) 12.5 mg TABS tablet Take 12.5 mg by mouth daily.     No facility-administered medications prior to visit.      Allergies:   Review of patient's allergies indicates no known allergies.   Social History   Social History  . Marital status: Married    Spouse name: N/A  . Number of children: N/A  . Years of education: N/A   Social History Main Topics  . Smoking status: Former Smoker    Types: Cigarettes, 65, Pipe    Quit date: 03/14/1974  . Smokeless tobacco: Never Used  . Alcohol use No  . Drug use: No  . Sexual activity: Not Asked   Other Topics Concern  . None   Social History Narrative  . None     Family History:  The patient's family history includes Hypertension in his father; Parkinson's disease in his mother.   ROS:   Please see the history of present illness.    Blood-tinged sputum leading to discontinuation of Xarelto. Known elevated PSA. No actual  complaints I ever. Watchful waiting on both the lung and prostate issues is what he prefers. He also is not in favor of AICD.  All other systems reviewed and are negative.   PHYSICAL EXAM:   VS:  BP (!) 94/56   Pulse 78   Ht '5\' 11"'$  (1.803 m)   Wt 235 lb 9.6 oz (106.9 kg)   BMI 32.86 kg/m    GEN: Well nourished, well developed, in no acute distress  HEENT: normal  Neck: no JVD, carotid bruits, or masses Cardiac: RRR; no murmurs, rubs, or gallops,no edema  Respiratory:  clear to auscultation bilaterally, normal work of breathing GI: soft, nontender, nondistended, + BS MS: no deformity or atrophy  Skin: warm and dry, no rash Neuro:  Alert and Oriented x 3, Strength and  sensation are intact Psych: euthymic mood, full affect  Wt Readings from Last 3 Encounters:  12/09/15 235 lb 9.6 oz (106.9 kg)  05/22/15 240 lb (108.9 kg)  04/27/15 232 lb (105.2 kg)      Studies/Labs Reviewed:   EKG:  EKG  Not repeated  Recent Labs: 04/21/2015: BUN 18; Creat 1.04; Hemoglobin 13.4; Platelets 137; Potassium 4.1; Sodium 139   Lipid Panel No results found for: CHOL, TRIG, HDL, CHOLHDL, VLDL, LDLCALC, LDLDIRECT  Additional studies/ records that were reviewed today include:  No new data    ASSESSMENT:    1. Chronic systolic heart failure (HCC)   2. Paroxysmal atrial fibrillation ([place])   3. Non-ischemic cardiomyopathy ([place])   4. Obstructive sleep apnea   5. Chronic anticoagulation      PLAN:  In order of problems listed above:  1. No evidence of volume overload. EF is known to be less than 35%. He refuses consideration of defibrillator. 2. Paroxysmal atrial fibrillation documented during hospitalizations in the past. Was on Xarelto therapy but discontinued this on his own. He refuses to resume the therapy at this time. 3. Known nonischemic cardiomyopathy. Widely patent coronary arteries when he underwent coronary angiography within the past year. 4. He uses CPAP 5. Discussed the importance of anticoagulation and its superior artery over aspirin for stroke prevention. We spent considerable time comparing in contrasting. He feels he should remain off of Xarelto because of blood-tinged sputum and his desire to keep things simple. He does have an on diagnosed/evaluated lung mass.    Medication Adjustments/Labs and Tests Ordered: Current medicines are reviewed at length with the patient today.  Concerns regarding medicines are outlined above.  Medication changes, Labs and Tests ordered today are listed in the Patient Instructions below. Patient Instructions  Medication Instructions:  Your physician recommends that you continue on your current medications as  directed. Please refer to the Current Medication list given to you today.   Your cardiac medications have been refilled today  Labwork: None ordered  Testing/Procedures: None ordered  Follow-Up: Your physician wants you to follow-up in: 6-8 months with Dr.Tomaz Janis You will receive a reminder letter in the mail two months in advance. If you don't receive a letter, please call our office to schedule the follow-up appointment.   Any Other Special Instructions Will Be Listed Below (If Applicable).     If you need a refill on your cardiac medications before your next appointment, please call your pharmacy.      Signed, Sinclair Grooms, MD  12/09/2015 2:32 PM    [facility] [address], [place], [place]  [address] Phone: [phone]; Fax: [phone]

## 2015-12-09 NOTE — Patient Instructions (Signed)
Medication Instructions:  Your physician recommends that you continue on your current medications as directed. Please refer to the Current Medication list given to you today.   Your cardiac medications have been refilled today  Labwork: None ordered  Testing/Procedures: None ordered  Follow-Up: Your physician wants you to follow-up in: 6-8 months with Dr.Smith You will receive a reminder letter in the mail two months in advance. If you don't receive a letter, please call our office to schedule the follow-up appointment.   Any Other Special Instructions Will Be Listed Below (If Applicable).     If you need a refill on your cardiac medications before your next appointment, please call your pharmacy.

## 2016-03-09 ENCOUNTER — Other Ambulatory Visit: Payer: Self-pay

## 2016-03-09 MED ORDER — CARVEDILOL 12.5 MG PO TABS: 12.5000 mg | ORAL_TABLET | Freq: Two times a day (BID) | ORAL | 3 refills | Status: DC

## 2016-03-09 MED ORDER — FUROSEMIDE 40 MG PO TABS: 40.0000 mg | ORAL_TABLET | Freq: Every day | ORAL | 3 refills | Status: DC

## 2016-03-11 ENCOUNTER — Telehealth: Payer: Self-pay | Admitting: Interventional Cardiology

## 2016-03-11 NOTE — Telephone Encounter (Signed)
Pt's wife calling to check on form faxed to [place] by [facility], also has a few other questions-pls call

## 2016-03-11 NOTE — Telephone Encounter (Signed)
Spoke with patient's wife today and informed her we did have the application. I did let her know it needs Dr. Thompson Caul signature before sending. She also requested some samples. 2 bottles of Entresto 24-26 provided.

## 2016-03-15 ENCOUNTER — Telehealth: Payer: Self-pay

## 2016-03-15 NOTE — Telephone Encounter (Signed)
Re-enrollment for Entresto 24-26 with [facility] faxed to them today.

## 2016-04-08 ENCOUNTER — Encounter: Payer: Self-pay | Admitting: *Deleted

## 2016-04-11 NOTE — Telephone Encounter (Signed)
Entered in error

## 2017-01-10 ENCOUNTER — Other Ambulatory Visit: Payer: Self-pay | Admitting: Interventional Cardiology

## 2017-02-07 ENCOUNTER — Encounter (INDEPENDENT_AMBULATORY_CARE_PROVIDER_SITE_OTHER): Payer: Self-pay

## 2017-02-07 ENCOUNTER — Encounter: Payer: Self-pay | Admitting: Nurse Practitioner

## 2017-02-07 ENCOUNTER — Ambulatory Visit (INDEPENDENT_AMBULATORY_CARE_PROVIDER_SITE_OTHER): Payer: 59 | Admitting: Nurse Practitioner

## 2017-02-07 VITALS — BP 118/60 | HR 70 | Ht 71.0 in | Wt 237.1 lb

## 2017-02-07 DIAGNOSIS — E785 Hyperlipidemia, unspecified: Secondary | ICD-10-CM

## 2017-02-07 DIAGNOSIS — I428 Other cardiomyopathies: Secondary | ICD-10-CM

## 2017-02-07 LAB — LIPID PANEL
Chol/HDL Ratio: 4.3 ratio (ref 0.0–5.0)
Cholesterol, Total: 155 mg/dL (ref 100–199)
HDL: 36 mg/dL — ABNORMAL LOW (ref >39)
LDL Calculated: 94 mg/dL (ref 0–99)
Triglycerides: 125 mg/dL (ref 0–149)
VLDL Cholesterol Cal: 25 mg/dL (ref 5–40)

## 2017-02-07 LAB — BASIC METABOLIC PANEL
BUN/Creatinine Ratio: 16 (ref 10–24)
BUN: 16 mg/dL (ref 8–27)
CO2: 24 mmol/L (ref 20–29)
Calcium: 9.2 mg/dL (ref 8.6–10.2)
Chloride: 103 mmol/L (ref 96–106)
Creatinine, Ser: 1.02 mg/dL (ref 0.76–1.27)
GFR calc Af Amer: 88 mL/min/{1.73_m2} (ref >59)
GFR calc non Af Amer: 76 mL/min/{1.73_m2} (ref >59)
Glucose: 108 mg/dL — ABNORMAL HIGH (ref 65–99)
Potassium: 3.6 mmol/L (ref 3.5–5.2)
Sodium: 138 mmol/L (ref 134–144)

## 2017-02-07 LAB — CBC
Hematocrit: 44.4 % (ref 37.5–51.0)
Hemoglobin: 14.5 g/dL (ref 13.0–17.7)
MCH: 30.1 pg (ref 26.6–33.0)
MCHC: 32.7 g/dL (ref 31.5–35.7)
MCV: 92 fL (ref 79–97)
Platelets: 146 10*3/uL — ABNORMAL LOW (ref 150–379)
RBC: 4.81 x10E6/uL (ref 4.14–5.80)
RDW: 14.8 % (ref 12.3–15.4)
WBC: 5 10*3/uL (ref 3.4–10.8)

## 2017-02-07 LAB — HEPATIC FUNCTION PANEL
ALT: 33 IU/L (ref 0–44)
AST: 27 IU/L (ref 0–40)
Albumin: 4.5 g/dL (ref 3.6–4.8)
Alkaline Phosphatase: 63 IU/L (ref 39–117)
Bilirubin Total: 0.5 mg/dL (ref 0.0–1.2)
Bilirubin, Direct: 0.13 mg/dL (ref 0.00–0.40)
Total Protein: 7.1 g/dL (ref 6.0–8.5)

## 2017-02-07 MED ORDER — SACUBITRIL-VALSARTAN 24-26 MG PO TABS: 1.0000 | ORAL_TABLET | Freq: Two times a day (BID) | ORAL | 3 refills | Status: DC

## 2017-02-07 MED ORDER — CARVEDILOL 12.5 MG PO TABS
12.5000 mg | ORAL_TABLET | Freq: Two times a day (BID) | ORAL | 3 refills | Status: DC
Start: 2017-02-07 — End: 2017-05-12

## 2017-02-07 MED ORDER — FUROSEMIDE 40 MG PO TABS: 40.0000 mg | ORAL_TABLET | Freq: Every day | ORAL | 3 refills | Status: DC

## 2017-02-07 NOTE — Patient Instructions (Addendum)
We will be checking the following labs today - BMET, HPF, lipids and CBC   Medication Instructions:    Continue with your current medicines.   I sent in your medicine refills today    Testing/Procedures To Be Arranged:  N/A  Follow-Up:   See Dr. Tamala Julian in one year     Other Special Instructions:   N/A    If you need a refill on your cardiac medications before your next appointment, please call your pharmacy.   Call the [place] office at [phone] if you have any questions, problems or concerns.

## 2017-02-07 NOTE — Progress Notes (Signed)
CARDIOLOGY OFFICE NOTE  Date:  02/07/2017    Logan [name] Date of Birth: Jul 16, 1950 Medical Record #[id number]  PCP:  [name], MD  Cardiologist:  Jennings Books  Chief Complaint  Patient presents with  . Cardiomyopathy    Follow up visit - seen for Dr. Tamala Julian    History of Present Illness: Logan [name] is a 66 y.o. male who presents today for a follow up visit. Seen for Dr. Tamala Julian.  He has a history of NICM, EF of 25 to 30%, PAF, chronic anticoagulation, known lung mass (declined work up) and elevated PSA (declined workup).   Has not been seen since September of 2017. Noted that he had stopped his Xarelto due to blood tinged sputum. He was otherwise doing ok.   Comes in today. Here with his wife. Basically here to get his medicines refilled. He is on Entresto/beta blocker and lasix. No recent labs. Says he feels good. No chest pain. Not short of breath. No swelling. Not bloated. Still with blood tinged sputum - wondering if it is from [place]. He expresses his desire to NOT have any work up on the lung mass - this is hard for his wife but she understands his wishes.   Past Medical History:  Diagnosis Date  . Chronic systolic heart failure ([place]) 04/15/2015    EF 30-35% by echo 2016 , [facility]   . Lung mass 04/15/2015    Never biopsied. History of PET scan that raised concern.   . Metabolic syndrome 06/14/3293  . Obstructive sleep apnea 04/15/2015  . Paroxysmal atrial fibrillation ([place]) 04/15/2015   On apixaban   . Prostate cancer ([place]) 04/15/2015  . Thrombocytopenia ([place]) 04/15/2015    Medications: Current Meds  Medication Sig  . aspirin 81 MG tablet Take 81 mg by mouth daily.  . carvedilol (COREG) 12.5 MG tablet Take 1 tablet (12.5 mg total) by mouth 2 (two) times daily.  . Cholecalciferol (CVS VIT D 5000 HIGH-POTENCY) 5000 units capsule Take 5,000 Units by mouth daily.  . furosemide (LASIX) 40 MG tablet Take 1 tablet (40 mg total) by mouth daily.  .  Multiple Vitamin (MULTIVITAMIN) tablet Take 1 tablet by mouth daily.  . sacubitril-valsartan (ENTRESTO) 24-26 MG Take 1 tablet by mouth 2 (two) times daily. Please keep 02/08/17 appointment for further refills  . [DISCONTINUED] carvedilol (COREG) 12.5 MG tablet Take 1 tablet (12.5 mg total) by mouth 2 (two) times daily.  . [DISCONTINUED] furosemide (LASIX) 40 MG tablet Take 1 tablet (40 mg total) by mouth daily.  . [DISCONTINUED] sacubitril-valsartan (ENTRESTO) 24-26 MG Take 1 tablet by mouth 2 (two) times daily. Please keep 02/08/17 appointment for further refills     Allergies: No Known Allergies  Social History: The patient  reports that he quit smoking about 42 years ago. His smoking use included cigarettes, cigars, and pipe. he has never used smokeless tobacco. He reports that he does not drink alcohol or use drugs.   Family History: The patient's family history includes Hypertension in his father; Parkinson's disease in his mother.   Review of Systems: Please see the history of present illness.   Otherwise, the review of systems is positive for none.   All other systems are reviewed and negative.   Physical Exam: VS:  BP 118/60   Pulse 70   Ht 5\' 11"  (1.803 m)   Wt 237 lb 1.9 oz (107.6 kg)   BMI 33.07 kg/m  .  BMI Body mass index  is 33.07 kg/m.  Wt Readings from Last 3 Encounters:  02/07/17 237 lb 1.9 oz (107.6 kg)  12/09/15 235 lb 9.6 oz (106.9 kg)  05/22/15 240 lb (108.9 kg)    General: Pleasant. Well developed, well nourished and in no acute distress.   HEENT: Normal.  Neck: Supple, no JVD, carotid bruits, or masses noted.  Cardiac: Heart tones are distant. He has no edema.  Respiratory:  Lungs are fairly clear to auscultation bilaterally with normal work of breathing.  GI: Soft and nontender.  MS: No deformity or atrophy. Gait and ROM intact.  Skin: Warm and dry. Color is normal.  Neuro:  Strength and sensation are intact and no gross focal deficits noted.    Psych: Alert, appropriate and with normal affect.   LABORATORY DATA:  EKG:  EKG is ordered today. This demonstrates NSR - lateral Q's.  Lab Results  Component Value Date   WBC 5.3 04/21/2015   HGB 13.4 04/21/2015   HCT 39.0 04/21/2015   PLT 137 (L) 04/21/2015   GLUCOSE 97 04/21/2015   NA 139 04/21/2015   K 4.1 04/21/2015   CL 104 04/21/2015   CREATININE 1.04 04/21/2015   BUN 18 04/21/2015   CO2 24 04/21/2015   INR 1.51 (H) 04/21/2015     BNP (last 3 results) No results for input(s): BNP in the last 8760 hours.  ProBNP (last 3 results) No results for input(s): PROBNP in the last 8760 hours.   Other Studies Reviewed Today:  Right/Left Heart Cath and Coronary Angiography 04/2015  Conclusion   1. Mid LAD to Dist LAD lesion, 20% stenosed. 2. Mid RCA to Dist RCA lesion, 20% stenosed. 3. There is moderate to severe left ventricular systolic dysfunction.    Widely patent coronary arteries  Severe global hypokinesis with EF 25%. Elevated left ventricular end-diastolic pressure  Normal pulmonary artery pressures.  RECOMMENDATIONS:   No evidence of coronary artery disease  Aggressive medical therapy for left ventricular systolic dysfunction.    Assessment/Plan: 1. NICM - on Entresto, beta blocker and diuretic - looks very well compensated. Needs labs today. Medicines refilled today.   2. PAF - in sinus - no longer on Xarelto due to hemoptysis  3. Lung mass - has declined work up - noted on CT scan from 2016 and even in 2015 - 7.7 cm right infrahilar mass - he has expressed his decline to me today as well. His hemoptysis is most certainly from the lung mass - not the Entresto - I have explained to them both that this will be a persistent issue going forward. I am very surprised at how well he is doing overall.   4. Elevated PSA - has declined work up  Current medicines are reviewed with the patient today.  The patient does not have concerns regarding medicines  other than what has been noted above.  The following changes have been made:  See above.  Labs/ tests ordered today include:    Orders Placed This Encounter  Procedures  . Basic metabolic panel  . CBC  . Hepatic function panel  . Lipid panel  . EKG 12-Lead     Disposition:   FU with Dr. Tamala Julian in one year.   Patient is agreeable to this plan and will call if any problems develop in the interim.   SignedTruitt Merle, NP  02/07/2017 8:52 AM  [facility] [address] [place], [place]  [address] Phone: [phone] Fax: [phone]        

## 2017-02-08 ENCOUNTER — Ambulatory Visit: Payer: 59 | Admitting: Cardiology

## 2017-02-08 ENCOUNTER — Telehealth: Payer: Self-pay | Admitting: Nurse Practitioner

## 2017-02-08 NOTE — Telephone Encounter (Signed)
Patient wife [name]) calling, would like to know if she can have patient lab results in writing.

## 2017-02-08 NOTE — Telephone Encounter (Signed)
Will send to medical records for proper release of labs.

## 2017-02-08 NOTE — Telephone Encounter (Signed)
Spoke with Patient made him aware he will need to come in office sign release and he can pick up copy of his medical records.

## 2017-02-16 ENCOUNTER — Telehealth: Payer: Self-pay | Admitting: Nurse Practitioner

## 2017-02-16 MED ORDER — SACUBITRIL-VALSARTAN 24-26 MG PO TABS: 1.0000 | ORAL_TABLET | Freq: Two times a day (BID) | ORAL | 3 refills | Status: DC

## 2017-02-16 NOTE — Telephone Encounter (Signed)
New message     *STAT* If patient is at the pharmacy, call can be transferred to refill team.   1. Which medications need to be refilled? (please list name of each medication and dose if known) sacubitril-valsartan (ENTRESTO) 24-26 MG  2. Which pharmacy/location (including street and city if local pharmacy) is medication to be sent to? [facility] mail order  3. Do they need a 30 day or 90 day supply? [place]

## 2017-02-16 NOTE — Telephone Encounter (Signed)
Pt's medication sent to pt's pharmacy as requested. Confirmation received.

## 2017-02-16 NOTE — Telephone Encounter (Signed)
New message    Patient calling the office for samples of medication:   1.  What medication and dosage are you requesting samples for?sacubitril-valsartan (ENTRESTO) 24-26 MG  2.  Are you currently out of this medication? yes

## 2017-02-17 ENCOUNTER — Telehealth: Payer: Self-pay | Admitting: *Deleted

## 2017-02-17 NOTE — Telephone Encounter (Signed)
FYI: Patient called and requested a refill on his entresto. I made him aware that the refill was sent to [facility] yesterday as requested via phone call message routed to refills by scheduling. His wife started speaking and he handed the phone to her. She mentioned that the medication was sent in for the wrong dose at his office visit. I verified the dose with her and it was actually sent in for the correct dose. She then stated that it was actually sent to the wrong pharmacy, it should not have been sent to [facility]. She then mentioned that the medication needs a prior authorization. I made her aware that this was submitted today. She went on to say that the patient has been without the medication and she would like for Dr Tamala Julian and Truitt Merle to be made aware of this as they would not be happy about it. She stated that she had also requested samples when she called yesterday to request the refill but the message was routed incorrectly to Juanda Crumble, then Jeanann Lewandowsky took the message and routed it to [place] [facility] who was not in the office yesterday. She requested the name of the person that she spoke with yesterday that sent the msg about the refill, but I informed her that I would not disclose this. We do not have any samples in the office and I have made her aware of this. She wanted to know what they should do. I told her that they could pick up a short term supply at [facility] as the refills were initially sent there, I made her aware that she should be sure that HT is aware that they are waiting for the mail order to be processed so that [facility] can request an insurance override if necessary.

## 2017-02-17 NOTE — Telephone Encounter (Signed)
Received fax from [facility] for prior authorization for ENTRESTO, I have completed paperwork and will fax back to [facility]. Reviewing chart it appears that this patient has been on a program called ENTRESTO CENTRAL which helps him financially with this medications. Will continue prior authorization process for now.

## 2017-02-17 NOTE — Telephone Encounter (Signed)
Thank you for your help on this Logan [name].  I agree with your recommendations.

## 2017-02-22 NOTE — Telephone Encounter (Signed)
**Note De-Identified Azaylea Maves Obfuscation** See phone note from 02/17/17.

## 2017-05-12 ENCOUNTER — Other Ambulatory Visit: Payer: Self-pay | Admitting: Interventional Cardiology

## 2017-05-15 ENCOUNTER — Other Ambulatory Visit: Payer: Self-pay | Admitting: Nurse Practitioner

## 2017-05-15 MED ORDER — FUROSEMIDE 40 MG PO TABS: 40.0000 mg | ORAL_TABLET | Freq: Every day | ORAL | 0 refills | Status: DC

## 2017-05-15 MED ORDER — CARVEDILOL 12.5 MG PO TABS: 12.5000 mg | ORAL_TABLET | Freq: Two times a day (BID) | ORAL | 0 refills | Status: DC

## 2017-05-15 NOTE — Telephone Encounter (Signed)
New message   *STAT* If patient is at the pharmacy, call can be transferred to refill team.   1. Which medications need to be refilled? (please list name of each medication and dose if known) furosemide (LASIX) 40 MG tablet and carvedilol (COREG) 12.5 MG tablet  2. Which pharmacy/location (including street and city if local pharmacy) is medication to be sent to? [place], [place]  3. Do they need a 30 day or 90 day supply? 10 days

## 2017-05-15 NOTE — Telephone Encounter (Signed)
Pt's medication was sent in for a 10 day supply to [facility] until mail order arrives. Confirmation received.

## 2017-09-19 ENCOUNTER — Telehealth: Payer: Self-pay | Admitting: Interventional Cardiology

## 2017-09-19 NOTE — Telephone Encounter (Signed)
Called patient's wife back about message. Patient's wife complaining of patient's feet swelling. Patient's BP 130/79 HR 90. Patient has been traveling and came back last Wednesday. Encouraged patient to eat a low salt diet, which was probably unlikely while patient was traveling. Encouraged patient wife to have patient elevate his feet while sitting. Patient's wife also complained about patient having a cough and not feeling so well. Informed patient's wife to call patient's PCP for his cough and not feeling well. Patient's wife stated patient has been taking Lasix 40 mg daily, Coreg 12.5 mg BID, and Entresto 24/26 mg BID. Will forward to Dr. Tamala Julian for further advisement.

## 2017-09-19 NOTE — Telephone Encounter (Signed)
Pt's wife calling   Pt c/o swelling: STAT is pt has developed SOB within 24 hours  1) How much weight have you gained and in what time span? No weight  2) If swelling, where is the swelling located? feet  3) Are you currently taking a fluid pill? Yes  4) Are you currently SOB? No  5) Do you have a log of your daily weights (if so, list)?No  6) Have you gained 3 pounds in a day or 5 pounds in a week? No  7) Have you traveled recently? Yes went to [place]

## 2017-09-20 NOTE — Telephone Encounter (Signed)
Increase furosemide to 40 mg twice daily for 3 days.  After 3 days go back to usual dose of furosemide.  Needs to weigh every day.  Record weight under similar circumstances each morning.  Report weights to [place].  If swelling does not improve, let us know.  Probably needs an office visit before previously scheduled visit which is sometime later this year.

## 2017-09-21 NOTE — Telephone Encounter (Signed)
Spoke with pt's wife, DPR on file.  Advised her of recommendations per Dr. Tamala Julian.  Scheduled pt to see Dr. Tamala Julian 7/24.  Advised wife to call next week with wts and how pt's swelling it doing.  Wife verbalized understanding and was in agreement with this plan.

## 2017-09-25 ENCOUNTER — Telehealth: Payer: Self-pay | Admitting: *Deleted

## 2017-09-25 ENCOUNTER — Telehealth: Payer: Self-pay | Admitting: Interventional Cardiology

## 2017-09-25 NOTE — Telephone Encounter (Signed)
New Message     Patients wife is calling because she was told to call first thing Monday because they had increase the patients lasix. She states that he did go to the ER Friday as well. Please call

## 2017-09-25 NOTE — Telephone Encounter (Addendum)
Spoke with wife, DPR on file. Pt went to [facility] in [place] on Friday d/t worsening in swelling in lower extremities, lethargy and SOB.  Weight went from 241.4 yesterday to 243 today.  BP today was 124/83.  Pt needed sooner appt for cardiology.  Scheduled pt to see Truitt Merle, NP tomorrow at [facility] we will cancel appt on 7/24 with Dr. Tamala Julian if Logan [name] feels pt does not need to come back so soon.  Advised wife if any changes before tomorrow to take pt back to hospital.  Wife verbalized understanding and was in agreement with this plan.

## 2017-09-25 NOTE — Telephone Encounter (Signed)
Disregard opened in error °

## 2017-09-25 NOTE — Telephone Encounter (Signed)
Spoke with wife and she states she is unsure if pt can wait until tomorrow to be seen.  She's concerned and thinks she may want to go ahead and take him to [facility].  Wife states pt is just sitting in chair, not moving around and he is SOB, gasping at times.  Advised wife if breathing is that bad to go ahead and take him to [place] or bring him to [facility].  Wife will proceed to [facility] with pt.  She will call me with an update and we will cancel appt tomorrow if [facility] plans to keep pt.  Wife appreciative for call.

## 2017-09-25 NOTE — Telephone Encounter (Signed)
Follow up   Spouse calling back to ask additional questions regarding the need to go to ED.

## 2017-09-26 ENCOUNTER — Encounter: Payer: Self-pay | Admitting: Nurse Practitioner

## 2017-09-26 ENCOUNTER — Inpatient Hospital Stay (HOSPITAL_COMMUNITY)
Admission: EM | Admit: 2017-09-26 | Discharge: 2017-10-12 | DRG: 291 | Disposition: A | Discharge status: Home Health | Payer: 59 | Attending: Internal Medicine | Admitting: Internal Medicine

## 2017-09-26 ENCOUNTER — Inpatient Hospital Stay (HOSPITAL_COMMUNITY): Payer: 59

## 2017-09-26 ENCOUNTER — Encounter (INDEPENDENT_AMBULATORY_CARE_PROVIDER_SITE_OTHER): Payer: Self-pay

## 2017-09-26 ENCOUNTER — Other Ambulatory Visit: Payer: Self-pay

## 2017-09-26 ENCOUNTER — Encounter (HOSPITAL_COMMUNITY): Payer: Self-pay | Admitting: Internal Medicine

## 2017-09-26 ENCOUNTER — Ambulatory Visit (INDEPENDENT_AMBULATORY_CARE_PROVIDER_SITE_OTHER): Payer: 59 | Admitting: Nurse Practitioner

## 2017-09-26 VITALS — BP 118/60 | HR 141 | Ht 70.0 in | Wt 245.8 lb

## 2017-09-26 DIAGNOSIS — J9601 Acute respiratory failure with hypoxia: Secondary | ICD-10-CM | POA: Diagnosis present

## 2017-09-26 DIAGNOSIS — I340 Nonrheumatic mitral (valve) insufficiency: Secondary | ICD-10-CM | POA: Diagnosis not present

## 2017-09-26 DIAGNOSIS — E876 Hypokalemia: Secondary | ICD-10-CM | POA: Diagnosis not present

## 2017-09-26 DIAGNOSIS — Z79899 Other long term (current) drug therapy: Secondary | ICD-10-CM

## 2017-09-26 DIAGNOSIS — Z87891 Personal history of nicotine dependence: Secondary | ICD-10-CM | POA: Diagnosis not present

## 2017-09-26 DIAGNOSIS — E1165 Type 2 diabetes mellitus with hyperglycemia: Secondary | ICD-10-CM | POA: Diagnosis present

## 2017-09-26 DIAGNOSIS — R918 Other nonspecific abnormal finding of lung field: Secondary | ICD-10-CM

## 2017-09-26 DIAGNOSIS — I420 Dilated cardiomyopathy: Secondary | ICD-10-CM | POA: Diagnosis present

## 2017-09-26 DIAGNOSIS — I5082 Biventricular heart failure: Secondary | ICD-10-CM | POA: Diagnosis present

## 2017-09-26 DIAGNOSIS — R739 Hyperglycemia, unspecified: Secondary | ICD-10-CM | POA: Diagnosis not present

## 2017-09-26 DIAGNOSIS — J9621 Acute and chronic respiratory failure with hypoxia: Secondary | ICD-10-CM | POA: Diagnosis not present

## 2017-09-26 DIAGNOSIS — J9602 Acute respiratory failure with hypercapnia: Secondary | ICD-10-CM | POA: Diagnosis present

## 2017-09-26 DIAGNOSIS — J9600 Acute respiratory failure, unspecified whether with hypoxia or hypercapnia: Secondary | ICD-10-CM | POA: Diagnosis not present

## 2017-09-26 DIAGNOSIS — D649 Anemia, unspecified: Secondary | ICD-10-CM | POA: Diagnosis present

## 2017-09-26 DIAGNOSIS — I63442 Cerebral infarction due to embolism of left cerebellar artery: Secondary | ICD-10-CM | POA: Diagnosis not present

## 2017-09-26 DIAGNOSIS — J918 Pleural effusion in other conditions classified elsewhere: Secondary | ICD-10-CM | POA: Diagnosis present

## 2017-09-26 DIAGNOSIS — C7970 Secondary malignant neoplasm of unspecified adrenal gland: Secondary | ICD-10-CM | POA: Diagnosis present

## 2017-09-26 DIAGNOSIS — J14 Pneumonia due to Hemophilus influenzae: Secondary | ICD-10-CM | POA: Diagnosis present

## 2017-09-26 DIAGNOSIS — C3491 Malignant neoplasm of unspecified part of right bronchus or lung: Secondary | ICD-10-CM | POA: Diagnosis present

## 2017-09-26 DIAGNOSIS — Z9111 Patient's noncompliance with dietary regimen: Secondary | ICD-10-CM

## 2017-09-26 DIAGNOSIS — I428 Other cardiomyopathies: Secondary | ICD-10-CM | POA: Diagnosis not present

## 2017-09-26 DIAGNOSIS — I252 Old myocardial infarction: Secondary | ICD-10-CM

## 2017-09-26 DIAGNOSIS — C61 Malignant neoplasm of prostate: Secondary | ICD-10-CM | POA: Diagnosis present

## 2017-09-26 DIAGNOSIS — G9349 Other encephalopathy: Secondary | ICD-10-CM | POA: Diagnosis present

## 2017-09-26 DIAGNOSIS — I5043 Acute on chronic combined systolic (congestive) and diastolic (congestive) heart failure: Secondary | ICD-10-CM | POA: Diagnosis not present

## 2017-09-26 DIAGNOSIS — G4733 Obstructive sleep apnea (adult) (pediatric): Secondary | ICD-10-CM | POA: Diagnosis present

## 2017-09-26 DIAGNOSIS — Z978 Presence of other specified devices: Secondary | ICD-10-CM

## 2017-09-26 DIAGNOSIS — I959 Hypotension, unspecified: Secondary | ICD-10-CM | POA: Diagnosis present

## 2017-09-26 DIAGNOSIS — I480 Paroxysmal atrial fibrillation: Secondary | ICD-10-CM | POA: Diagnosis present

## 2017-09-26 DIAGNOSIS — C3490 Malignant neoplasm of unspecified part of unspecified bronchus or lung: Secondary | ICD-10-CM

## 2017-09-26 DIAGNOSIS — I110 Hypertensive heart disease with heart failure: Secondary | ICD-10-CM | POA: Diagnosis present

## 2017-09-26 DIAGNOSIS — R0602 Shortness of breath: Secondary | ICD-10-CM | POA: Diagnosis present

## 2017-09-26 DIAGNOSIS — I6330 Cerebral infarction due to thrombosis of unspecified cerebral artery: Secondary | ICD-10-CM

## 2017-09-26 DIAGNOSIS — I4891 Unspecified atrial fibrillation: Secondary | ICD-10-CM | POA: Diagnosis present

## 2017-09-26 DIAGNOSIS — J810 Acute pulmonary edema: Secondary | ICD-10-CM | POA: Diagnosis not present

## 2017-09-26 DIAGNOSIS — E669 Obesity, unspecified: Secondary | ICD-10-CM | POA: Diagnosis present

## 2017-09-26 DIAGNOSIS — N179 Acute kidney failure, unspecified: Secondary | ICD-10-CM | POA: Diagnosis present

## 2017-09-26 DIAGNOSIS — I313 Pericardial effusion (noninflammatory): Secondary | ICD-10-CM | POA: Diagnosis present

## 2017-09-26 DIAGNOSIS — R042 Hemoptysis: Secondary | ICD-10-CM | POA: Diagnosis present

## 2017-09-26 DIAGNOSIS — K769 Liver disease, unspecified: Secondary | ICD-10-CM | POA: Diagnosis not present

## 2017-09-26 DIAGNOSIS — I6340 Cerebral infarction due to embolism of unspecified cerebral artery: Secondary | ICD-10-CM | POA: Diagnosis not present

## 2017-09-26 DIAGNOSIS — R9389 Abnormal findings on diagnostic imaging of other specified body structures: Secondary | ICD-10-CM | POA: Diagnosis not present

## 2017-09-26 DIAGNOSIS — I484 Atypical atrial flutter: Secondary | ICD-10-CM | POA: Diagnosis present

## 2017-09-26 DIAGNOSIS — I481 Persistent atrial fibrillation: Secondary | ICD-10-CM | POA: Diagnosis present

## 2017-09-26 DIAGNOSIS — E871 Hypo-osmolality and hyponatremia: Secondary | ICD-10-CM | POA: Diagnosis not present

## 2017-09-26 DIAGNOSIS — E785 Hyperlipidemia, unspecified: Secondary | ICD-10-CM

## 2017-09-26 DIAGNOSIS — J90 Pleural effusion, not elsewhere classified: Secondary | ICD-10-CM

## 2017-09-26 DIAGNOSIS — Z8249 Family history of ischemic heart disease and other diseases of the circulatory system: Secondary | ICD-10-CM

## 2017-09-26 DIAGNOSIS — C787 Secondary malignant neoplasm of liver and intrahepatic bile duct: Secondary | ICD-10-CM | POA: Diagnosis present

## 2017-09-26 DIAGNOSIS — R160 Hepatomegaly, not elsewhere classified: Secondary | ICD-10-CM | POA: Diagnosis not present

## 2017-09-26 DIAGNOSIS — Z6837 Body mass index (BMI) 37.0-37.9, adult: Secondary | ICD-10-CM

## 2017-09-26 DIAGNOSIS — E8881 Metabolic syndrome: Secondary | ICD-10-CM | POA: Diagnosis present

## 2017-09-26 DIAGNOSIS — J9622 Acute and chronic respiratory failure with hypercapnia: Secondary | ICD-10-CM | POA: Diagnosis not present

## 2017-09-26 DIAGNOSIS — Z7982 Long term (current) use of aspirin: Secondary | ICD-10-CM

## 2017-09-26 DIAGNOSIS — I5021 Acute systolic (congestive) heart failure: Secondary | ICD-10-CM | POA: Diagnosis not present

## 2017-09-26 DIAGNOSIS — G9340 Encephalopathy, unspecified: Secondary | ICD-10-CM | POA: Diagnosis not present

## 2017-09-26 DIAGNOSIS — I639 Cerebral infarction, unspecified: Secondary | ICD-10-CM | POA: Diagnosis not present

## 2017-09-26 HISTORY — DX: Heart failure, unspecified: I50.9

## 2017-09-26 HISTORY — DX: Dependence on other enabling machines and devices: Z99.89

## 2017-09-26 HISTORY — DX: Essential (primary) hypertension: I10

## 2017-09-26 HISTORY — DX: Obstructive sleep apnea (adult) (pediatric): G47.33

## 2017-09-26 LAB — PROTIME-INR
INR: 1.32
INR: 1.35
PROTHROMBIN TIME: 16.3 s — AB (ref 11.4–15.2)
PROTHROMBIN TIME: 16.5 s — AB (ref 11.4–15.2)

## 2017-09-26 LAB — COMPREHENSIVE METABOLIC PANEL
ALT: 45 U/L — ABNORMAL HIGH (ref 0–44)
ALT: 46 U/L — ABNORMAL HIGH (ref 0–44)
ANION GAP: 10 (ref 5–15)
AST: 35 U/L (ref 15–41)
AST: 32 U/L (ref 15–41)
Albumin: 2.6 g/dL — ABNORMAL LOW (ref 3.5–5.0)
Albumin: 2.7 g/dL — ABNORMAL LOW (ref 3.5–5.0)
Alkaline Phosphatase: 94 U/L (ref 38–126)
Alkaline Phosphatase: 89 U/L (ref 38–126)
Anion gap: 8 (ref 5–15)
BILIRUBIN TOTAL: 0.8 mg/dL (ref 0.3–1.2)
BUN: 17 mg/dL (ref 8–23)
BUN: 16 mg/dL (ref 8–23)
CHLORIDE: 98 mmol/L (ref 98–111)
CO2: 32 mmol/L (ref 22–32)
CO2: 30 mmol/L (ref 22–32)
Calcium: 8.8 mg/dL — ABNORMAL LOW (ref 8.9–10.3)
Calcium: 8.5 mg/dL — ABNORMAL LOW (ref 8.9–10.3)
Chloride: 98 mmol/L (ref 98–111)
Creatinine, Ser: 1.22 mg/dL (ref 0.61–1.24)
Creatinine, Ser: 1.1 mg/dL (ref 0.61–1.24)
GFR, EST NON AFRICAN AMERICAN: 60 mL/min — AB (ref >60)
Glucose, Bld: 115 mg/dL — ABNORMAL HIGH (ref 70–99)
Glucose, Bld: 117 mg/dL — ABNORMAL HIGH (ref 70–99)
POTASSIUM: 4.8 mmol/L (ref 3.5–5.1)
Potassium: 4.3 mmol/L (ref 3.5–5.1)
SODIUM: 138 mmol/L (ref 135–145)
Sodium: 138 mmol/L (ref 135–145)
TOTAL PROTEIN: 7.2 g/dL (ref 6.5–8.1)
Total Bilirubin: 0.8 mg/dL (ref 0.3–1.2)
Total Protein: 7.3 g/dL (ref 6.5–8.1)

## 2017-09-26 LAB — CBC WITH DIFFERENTIAL/PLATELET
Abs Immature Granulocytes: 0.1 10*3/uL (ref 0.0–0.1)
Basophils Absolute: 0 10*3/uL (ref 0.0–0.1)
Basophils Relative: 0 %
Eosinophils Absolute: 0 10*3/uL (ref 0.0–0.7)
Eosinophils Relative: 0 %
HCT: 34 % — ABNORMAL LOW (ref 39.0–52.0)
Hemoglobin: 11.4 g/dL — ABNORMAL LOW (ref 13.0–17.0)
Immature Granulocytes: 1 %
Lymphocytes Relative: 22 %
Lymphs Abs: 1.8 10*3/uL (ref 0.7–4.0)
MCH: 30 pg (ref 26.0–34.0)
MCHC: 33.5 g/dL (ref 30.0–36.0)
MCV: 89.5 fL (ref 78.0–100.0)
Monocytes Absolute: 0.7 10*3/uL (ref 0.1–1.0)
Monocytes Relative: 8 %
Neutro Abs: 5.9 10*3/uL (ref 1.7–7.7)
Neutrophils Relative %: 69 %
Platelets: 336 10*3/uL (ref 150–400)
RBC: 3.8 MIL/uL — ABNORMAL LOW (ref 4.22–5.81)
RDW: 13.7 % (ref 11.5–15.5)
WBC: 8.4 10*3/uL (ref 4.0–10.5)

## 2017-09-26 LAB — APTT
APTT: 31 s (ref 24–36)
APTT: 71 s — AB (ref 24–36)

## 2017-09-26 LAB — PSA: Prostatic Specific Antigen: 84.83 ng/mL — ABNORMAL HIGH (ref 0.00–4.00)

## 2017-09-26 LAB — ECHOCARDIOGRAM COMPLETE
Height: 70 in
Weight: 3932.8 oz

## 2017-09-26 LAB — TROPONIN I
TROPONIN I: 0.04 ng/mL — AB (ref <0.03)
TROPONIN I: 0.03 ng/mL — AB (ref <0.03)
Troponin I: <0.03 ng/mL (ref <0.03)

## 2017-09-26 LAB — MAGNESIUM
MAGNESIUM: 2.3 mg/dL (ref 1.7–2.4)
Magnesium: 2.1 mg/dL (ref 1.7–2.4)

## 2017-09-26 LAB — TSH: TSH: 2.168 u[IU]/mL (ref 0.350–4.500)

## 2017-09-26 LAB — BRAIN NATRIURETIC PEPTIDE: B Natriuretic Peptide: 537.4 pg/mL — ABNORMAL HIGH (ref 0.0–100.0)

## 2017-09-26 LAB — HEPARIN LEVEL (UNFRACTIONATED): HEPARIN UNFRACTIONATED: 0.2 [IU]/mL — AB (ref 0.30–0.70)

## 2017-09-26 MED ORDER — DILTIAZEM LOAD VIA INFUSION
20.0000 mg | Freq: Once | INTRAVENOUS | Status: AC
  Administered 2017-09-26: 20 mg via INTRAVENOUS
  Filled 2017-09-26: qty 20

## 2017-09-26 MED ORDER — CARVEDILOL 12.5 MG PO TABS
12.5000 mg | ORAL_TABLET | Freq: Two times a day (BID) | ORAL | Status: DC
  Administered 2017-09-26 – 2017-09-27 (×2): 12.5 mg via ORAL
  Filled 2017-09-26 (×2): qty 1

## 2017-09-26 MED ORDER — HEPARIN (PORCINE) IN NACL 100-0.45 UNIT/ML-% IJ SOLN
1400.0000 [IU]/h | INTRAMUSCULAR | Status: DC
  Administered 2017-09-26: 1400 [IU]/h via INTRAVENOUS
  Filled 2017-09-26: qty 250

## 2017-09-26 MED ORDER — SODIUM CHLORIDE 0.9% FLUSH
3.0000 mL | Freq: Two times a day (BID) | INTRAVENOUS | Status: DC
  Administered 2017-09-27 – 2017-10-06 (×18): 3 mL via INTRAVENOUS
  Administered 2017-10-07: 10 mL via INTRAVENOUS
  Administered 2017-10-07 – 2017-10-10 (×5): 3 mL via INTRAVENOUS

## 2017-09-26 MED ORDER — FUROSEMIDE 10 MG/ML IJ SOLN
60.0000 mg | Freq: Two times a day (BID) | INTRAMUSCULAR | Status: DC
  Administered 2017-09-26 – 2017-09-27 (×3): 60 mg via INTRAVENOUS
  Filled 2017-09-26 (×4): qty 6

## 2017-09-26 MED ORDER — ACETAMINOPHEN 325 MG PO TABS
650.0000 mg | ORAL_TABLET | ORAL | Status: DC | PRN
  Filled 2017-09-26: qty 2

## 2017-09-26 MED ORDER — HEPARIN BOLUS VIA INFUSION
5000.0000 [IU] | Freq: Once | INTRAVENOUS | Status: AC
  Administered 2017-09-26: 5000 [IU] via INTRAVENOUS
  Filled 2017-09-26: qty 5000

## 2017-09-26 MED ORDER — ONDANSETRON HCL 4 MG/2ML IJ SOLN: 4.0000 mg | Freq: Four times a day (QID) | INTRAMUSCULAR | Status: DC | PRN

## 2017-09-26 MED ORDER — SACUBITRIL-VALSARTAN 24-26 MG PO TABS
1.0000 | ORAL_TABLET | Freq: Two times a day (BID) | ORAL | Status: DC
  Administered 2017-09-26 – 2017-10-02 (×11): 1 via ORAL
  Filled 2017-09-26 (×12): qty 1

## 2017-09-26 MED ORDER — SODIUM CHLORIDE 0.9 % IV SOLN
250.0000 mL | INTRAVENOUS | Status: DC | PRN
  Administered 2017-09-29: 11:00:00 via INTRAVENOUS

## 2017-09-26 MED ORDER — ASPIRIN EC 81 MG PO TBEC
81.0000 mg | DELAYED_RELEASE_TABLET | Freq: Every day | ORAL | Status: DC
  Administered 2017-09-27 – 2017-10-03 (×6): 81 mg via ORAL
  Filled 2017-09-26 (×8): qty 1

## 2017-09-26 MED ORDER — SODIUM CHLORIDE 0.9% FLUSH: 3.0000 mL | INTRAVENOUS | Status: DC | PRN

## 2017-09-26 MED ORDER — DILTIAZEM HCL-DEXTROSE 100-5 MG/100ML-% IV SOLN (PREMIX)
5.0000 mg/h | INTRAVENOUS | Status: DC
  Administered 2017-09-26: 7.5 mg/h via INTRAVENOUS
  Administered 2017-09-26: 5 mg/h via INTRAVENOUS
  Administered 2017-09-27: 7.5 mg/h via INTRAVENOUS
  Filled 2017-09-26 (×3): qty 100

## 2017-09-26 MED ORDER — ACETAMINOPHEN 325 MG PO TABS: 650.0000 mg | ORAL_TABLET | ORAL | Status: DC | PRN

## 2017-09-26 MED ORDER — HEPARIN (PORCINE) IN NACL 100-0.45 UNIT/ML-% IJ SOLN
1850.0000 [IU]/h | INTRAMUSCULAR | Status: DC
  Administered 2017-09-27: 1650 [IU]/h via INTRAVENOUS
  Administered 2017-09-27 – 2017-10-01 (×8): 1850 [IU]/h via INTRAVENOUS
  Filled 2017-09-26 (×9): qty 250

## 2017-09-26 NOTE — Progress Notes (Signed)
  Echocardiogram 2D Echocardiogram has been performed.  Cyenna Rebello L Androw 09/26/2017, 3:23 PM

## 2017-09-26 NOTE — Progress Notes (Addendum)
CARDIOLOGY OFFICE NOTE  Date:  09/26/2017    Logan [name] Date of Birth: [birth year] Medical Record #[id number]  PCP:  [name], MD  Cardiologist:  Jennings Books    Chief Complaint  Patient presents with  . Congestive Heart Failure  . Shortness of Breath    Work in visit - seen for Dr. Tamala Julian    History of Present Illness: Logan [name] is a 67 y.o. male who presents today for a work in visit. Seen for Dr. Tamala Julian.  He has a history of NICM, EF of 25 to 30%, PAF, past use of anticoagulation, known lung mass (repeatedly declined work up) and elevated PSA (declined workup).   Last seen here in November of 2018 - basically to get meds refilled. He had stopped his Xarelto previously due to hemoptysis. He again expressed desire to NOT have his lung mass worked up.   Notes in Care Everywhere from 03/2015 also a lesion in the adrenal gland. 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 The mass in the right lung was hypermetabolic.   Multiple phone calls recently - for swelling and shortness of breath. He has had recent travel. He has had his lasix increased. He was in the ER with [facility] this past Friday. He refused admission. CXR with bilateral effusions - worse on the right. He is apparently to be seen at [place]. He signed out AMA.    Phone call yesterday - "Spoke with wife and she states she is unsure if pt can wait until tomorrow to be seen.  She's concerned and thinks she may want to go ahead and take him to [facility].  Wife states pt is just sitting in chair, not moving around and he is SOB, gasping at times.  Advised wife if breathing is that bad to go ahead and take him to [place] or bring him to [facility].  Wife will proceed to [facility] with pt.  She will call me with an update and we will cancel appt tomorrow if [facility] plans to keep pt.  Wife appreciative for call".    Comes in today. Here with his wife. He has not done well here recently. He has been traveling and has had extra salt. He has had recent "flu" like symptoms with cough - coughing up blood about 2 weeks ago. Remains short of breath with activity. No real pain anywhere. Has had some palpitation but not today. He apparently had an appointment at [facility] for this week - sounds like they cancelled "to come here" instead. Still with lots of swelling. Taking total of 80 mg of Lasix daily for the past week - minimal improvement. He remains only on baby aspirin. Notes that he had more hemoptysis when on Xarelto. He has worsening fatigue. He has more cough with lying down and more shortness of breath. He cannot walk more than a few feet without being short of breath.   Past Medical History:  Diagnosis Date  . Chronic systolic heart failure ([place]) 04/15/2015    EF 30-35% by echo 2016 , [facility]   . Lung mass 04/15/2015    Never biopsied. History of PET scan that raised concern.   . Metabolic syndrome 03/22/6220  . Obstructive sleep apnea 04/15/2015  . Paroxysmal atrial fibrillation ([place]) 04/15/2015   On apixaban   . Prostate cancer ([place]) 04/15/2015  . Thrombocytopenia ([place]) 04/15/2015  Past Surgical History:  Procedure Laterality Date  . CARDIAC CATHETERIZATION N/A 04/27/2015   Procedure: Right/Left Heart Cath and Coronary Angiography;  Surgeon: [name], MD;  Location: [place] [facility];  Service: Cardiovascular;  Laterality: N/A;  . PROSTATE BIOPSY       Medications: Current Meds  Medication Sig  . aspirin 81 MG tablet Take 81 mg by mouth daily.  . carvedilol (COREG) 12.5 MG tablet Take 1 tablet (12.5 mg total) by mouth 2 (two) times daily.  . Cholecalciferol ([employer] VIT D 5000 HIGH-POTENCY) 5000 units capsule Take 5,000 Units by mouth daily.  . furosemide (LASIX) 40 MG tablet Take 1 tablet (40 mg total) by mouth daily.  . Multiple Vitamin (MULTIVITAMIN) tablet Take 1 tablet by mouth daily.  .  sacubitril-valsartan (ENTRESTO) 24-26 MG Take 1 tablet by mouth 2 (two) times daily.  . [DISCONTINUED] sacubitril-valsartan (ENTRESTO) 24-26 MG Take 1 tablet by mouth 2 (two) times daily. Please keep 02/08/17 appointment for further refills (Patient taking differently: Take 1 tablet by mouth 2 (two) times daily. )     Allergies: No Known Allergies  Social History: The patient  reports that he quit smoking about 43 years ago. His smoking use included cigarettes, cigars, and pipe. He has never used smokeless tobacco. He reports that he does not drink alcohol or use drugs.   Family History: The patient's family history includes Hypertension in his father; Parkinson's disease in his mother.   Review of Systems: Please see the history of present illness.   Otherwise, the review of systems is positive for none.   All other systems are reviewed and negative.   Physical Exam: VS:  BP 118/60 (BP Location: Left Arm, Patient Position: Sitting, Cuff Size: Normal)   Pulse (!) 141   Ht 5\' 10"  (1.778 m)   Wt 245 lb 12.8 oz (111.5 kg)   BMI 35.27 kg/m  .  BMI Body mass index is 35.27 kg/m.  Wt Readings from Last 3 Encounters:  09/26/17 245 lb 12.8 oz (111.5 kg)  02/07/17 237 lb 1.9 oz (107.6 kg)  12/09/15 235 lb 9.6 oz (106.9 kg)    General: Pleasant. Short of breath with movement. Alert and in no acute distress.  Weight is up 7 pounds.  HEENT: Normal.  Neck: Supple, no JVD, carotid bruits, or masses noted.  Cardiac: Irregular irregular rhythm. Rate is elevated. Heart tones are distant. Over 1+ edema. Looks generally swollen.  Respiratory:  Decreased breath sounds noted.   GI: Obese - distended.   MS: No deformity or atrophy. Gait not tested. Skin: Warm and dry. Color is normal.  Neuro:  Strength and sensation are intact and no gross focal deficits noted.  Psych: Alert, appropriate and with normal affect.   LABORATORY DATA:  EKG:  EKG is ordered today. This demonstrates AF with RVR -  reviewed with Dr. Acie Fredrickson (DOD).  Lab Results  Component Value Date   WBC 5.0 02/07/2017   HGB 14.5 02/07/2017   HCT 44.4 02/07/2017   PLT 146 (L) 02/07/2017   GLUCOSE 108 (H) 02/07/2017   CHOL 155 02/07/2017   TRIG 125 02/07/2017   HDL 36 (L) 02/07/2017   LDLCALC 94 02/07/2017   ALT 33 02/07/2017   AST 27 02/07/2017   NA 138 02/07/2017   K 3.6 02/07/2017   CL 103 02/07/2017   CREATININE 1.02 02/07/2017   BUN 16 02/07/2017   CO2 24 02/07/2017   INR 1.51 (H) 04/21/2015  BNP (last 3 results) No results for input(s): BNP in the last 8760 hours.  ProBNP (last 3 results) No results for input(s): PROBNP in the last 8760 hours.   Other Studies Reviewed Today:  XR CHEST PA AND LATERAL September 22, 2017 Narrative:  INDICATION: Lower extremity edema. COMPARISON: Chest x-ray 08/21/2015. TECHNIQUE: Frontal and lateral chest radiographs performed.   FINDINGS:  Moderate right pleural effusion with extensive consolidation in right lower/right middle lobe.  Patchy opacities in left lower lobe. Cardiomegaly. No pneumothorax.   Impression:  IMPRESSION: 1. Moderate right pleural effusion with right lower/middle lobe consolidation. Minimal patchy opacities in left lower lobe. 2. Cardiomegaly.  Electronically Signed by: Leslie Dales   US VENOUS LOWER EXTREMITY BILATERAL September 23, 2017  TECHNIQUE: The veins of both lower extremities were interrogated from the visible common femoral vein to the distal popliteal vein. The junction of the greater saphenous with the common femoral vein as well as the posterior tibial veins were evaluated.  Gray scale, color, spectral, and doppler sonography was utilized. INDICATION: painful and swollen  COMPARISON: None.   FINDINGS:  - Deep vessels: Flow is present in all interrogated vessels and there are no internal echoes. Normal venous compressibility is demonstrated and there is augmentation of flow with appropriate maneuvers.  - Superficial  vessels: No thrombophlebitis. - Misc: None  Impression:  IMPRESSION:  1. No evidence of deep venous thrombosis. 2. Right popliteal fossa Baker's cyst measuring 3.9 x 1.5 x 3.1 cm.  Electronically Signed by: Leslie Dales    Right/Left Heart Cath and Coronary Angiography 04/2015  Conclusion   1. Mid LAD to Dist LAD lesion, 20% stenosed. 2. Mid RCA to Dist RCA lesion, 20% stenosed. 3. There is moderate to severe left ventricular systolic dysfunction.   Widely patent coronary arteries  Severe global hypokinesis with EF 25%. Elevated left ventricular end-diastolic pressure  Normal pulmonary artery pressures.  RECOMMENDATIONS:   No evidence of coronary artery disease  Aggressive medical therapy for left ventricular systolic dysfunction.    Assessment/Plan: 1. Acute on chronic systolic HF - has known NICM - now with worsening symptoms - NYHA II/III. Has associated AF with RVR as well. He will need echo. Will diurese with IV lasix. He has been seen along with Dr. Acie Fredrickson who is in agreement with this plan.   2. 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 Unfortunately, options now may be limited. This has been noted on CT scan from 2016 and even in 2015 - 7.7 cm right infrahilar mass.  3. Pleural effusion - may need thoracentesis  4. AF with RVR - no longer on Xarelto due to hemoptysis -  Discussed with Dr. Acie Fredrickson - will place on IV heparin - will need to follow closely. CHADSVASC is elevated - he is more than likely hypercoagulable due to oncologic issue - will need to watch for bleeding.   5. Elevated PSA - has declined prior work up  6. Obesity  Current medicines are reviewed with the patient today.  The patient does not have concerns regarding medicines other than what has been noted above.  The following changes have been made:   See above.  Labs/ tests ordered today include:    Orders Placed This Encounter  Procedures  . EKG 12-Lead     Disposition:   Further disposition pending. Transported by EMS  to the [facility] ER to begin plan of care - charge nurse "Mali" made aware.   Patient is agreeable to this plan and will call if any problems develop in the interim.   SignedTruitt Merle, NP  09/26/2017 8:43 AM  [facility] [address] [place], [place]  [address] Phone: [phone] Fax: [phone]

## 2017-09-26 NOTE — Patient Instructions (Addendum)
We are admitting you to the hospital today.   Call the [place] office at [phone] if you have any questions, problems or concerns.

## 2017-09-26 NOTE — Progress Notes (Signed)
RT gave patient sterile water. Wife said she could place pr on CPAP. RT will continue to monitor as needed.

## 2017-09-26 NOTE — Progress Notes (Signed)
Patient arrived to unit from [facility]. He is alert and oriented. Patient has been placed on telemetry and CCMD notified. Patient oriented to room and resting comfortably in bed. Will continue to monitor.

## 2017-09-26 NOTE — ED Notes (Signed)
Patient transported to CT 

## 2017-09-26 NOTE — H&P (Addendum)
Office Visit   09/26/2017 [facility]    [place], Marlane Hatcher, NP    Cardiology   NICM (nonischemic cardiomyopathy) [facility]) +1 more    Dx   Congestive Heart Failure , Shortness of Breath   ; Referred by [name], MD    Reason for Visit     Additional Documentation   Vitals: .  BP 118/60 (BP Location: Left Arm, Patient Position: Sitting, Cuff Size: Normal)    Pulse 141     Ht 5\' 10"  (1.778 m)    Wt 245 lb 12.8 oz (111.5 kg)    BMI 35.27 kg/m    BSA 2.35 m        More Vitals    Flowsheets:   Anthropometrics,    MEWS Score      Encounter Info:   Billing Info,    History,    Allergies,    Detailed Report       All Notes    Progress Notes by Burtis Junes, NP at 09/26/2017 8:00 AM   Author: Burtis Junes, NP Author Type: Nurse Practitioner Filed: 09/26/2017 8:48 AM  Note Status: Signed Cosign: Cosign Not Required Encounter Date: 09/26/2017  Editor: Burtis Junes, NP (Nurse Practitioner)  Expand All Collapse All       CARDIOLOGY OFFICE NOTE  Date:  09/26/2017    Logan [name] Date of Birth: [birth year] Medical Record #[id number]  PCP:  [name], MD     Cardiologist:  Jennings Books        Chief Complaint  Patient presents with  . Congestive Heart Failure  . Shortness of Breath    Work in visit - seen for Dr. Tamala Julian    History of Present Illness: Logan [name] is a 67 y.o. male who presents today for a work in visit. Seen for Dr. Tamala Julian.  He has a history of NICM, EF of 25 to 30%, PAF, past use of anticoagulation, known lung mass (repeatedly declinedwork up) and elevated PSA (declinedworkup).   Last seen here in November of 2018 - basically to get meds refilled. He had stopped his Xarelto previously due to hemoptysis. He again expressed desire to NOT have his lung mass worked up.   Notes in Care Everywhere from 03/2015 also a lesion in the adrenal gland. 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 The mass in the right lung was hypermetabolic.   Multiple phone calls recently - for swelling and shortness of breath. He has had recent travel. He has had his lasix increased. He was in the ER with [facility] this past Friday. He refused admission. CXR with bilateral effusions - worse on the right. He is apparently to be seen at [place]. He signed out AMA.    Phone call yesterday - "Spoke with wife and she states she is unsure if pt can wait until tomorrow to be seen. She's concerned and thinks she may want to go ahead and take him to [facility]. Wife states pt is just sitting in chair, not moving around and he is SOB, gasping at times. Advised wife if breathing is that bad to go ahead and take him to [place] or bring him to [facility]. Wife will proceed to [facility] with pt. She will call me with an update and we will cancel appt tomorrow if [facility] plans to keep pt. Wife  appreciative for call".   Comes in today. Here with his wife. He has not done well here recently. He has been traveling and has had extra salt. He has had recent "flu" like symptoms with cough - coughing up blood about 2 weeks ago. Remains short of breath with activity. No real pain anywhere. Has had some palpitation but not today. He apparently had an appointment at [facility] for this week - sounds like they cancelled "to come here" instead. Still with lots of swelling. Taking total of 80 mg of Lasix daily for the past week - minimal improvement. He remains only on baby aspirin. Notes that he had more hemoptysis when on Xarelto. He has worsening fatigue. He has more cough with lying down and more shortness of breath. He cannot walk more than a few feet without being short of breath.       Past Medical History:  Diagnosis Date  . Chronic systolic heart failure ([place]) 04/15/2015    EF 30-35% by echo 2016 ,  [facility]   . Lung mass 04/15/2015    Never biopsied. History of PET scan that raised concern.   . Metabolic syndrome 0/04/5850  . Obstructive sleep apnea 04/15/2015  . Paroxysmal atrial fibrillation ([place]) 04/15/2015   On apixaban   . Prostate cancer ([place]) 04/15/2015  . Thrombocytopenia ([place]) 04/15/2015         Past Surgical History:  Procedure Laterality Date  . CARDIAC CATHETERIZATION N/A 04/27/2015   Procedure: Right/Left Heart Cath and Coronary Angiography;  Surgeon: [name], MD;  Location: [facility];  Service: Cardiovascular;  Laterality: N/A;  . PROSTATE BIOPSY       Medications: ActiveMedications      Current Meds  Medication Sig  . aspirin 81 MG tablet Take 81 mg by mouth daily.  . carvedilol (COREG) 12.5 MG tablet Take 1 tablet (12.5 mg total) by mouth 2 (two) times daily.  . Cholecalciferol ([employer] VIT D 5000 HIGH-POTENCY) 5000 units capsule Take 5,000 Units by mouth daily.  . furosemide (LASIX) 40 MG tablet Take 1 tablet (40 mg total) by mouth daily.  . Multiple Vitamin (MULTIVITAMIN) tablet Take 1 tablet by mouth daily.  . sacubitril-valsartan (ENTRESTO) 24-26 MG Take 1 tablet by mouth 2 (two) times daily.  . [DISCONTINUED] sacubitril-valsartan (ENTRESTO) 24-26 MG Take 1 tablet by mouth 2 (two) times daily. Please keep 02/08/17 appointment for further refills (Patient taking differently: Take 1 tablet by mouth 2 (two) times daily. )       Allergies: No Known Allergies  Social History: The patient  reports that he quit smoking about 43 years ago. His smoking use included cigarettes, cigars, and pipe. He has never used smokeless tobacco. He reports that he does not drink alcohol or use drugs.   Family History: The patient's family history includes Hypertension in his father; Parkinson's disease in his mother.   Review of Systems: Please see the history of present illness.   Otherwise, the review of systems is positive for none.   All other systems  are reviewed and negative.   Physical Exam: VS:  BP 118/60 (BP Location: Left Arm, Patient Position: Sitting, Cuff Size: Normal)   Pulse (!) 141   Ht 5\' 10"  (1.778 m)   Wt 245 lb 12.8 oz (111.5 kg)   BMI 35.27 kg/m  .  BMI Body mass index is 35.27 kg/m.     Wt Readings from Last 3 Encounters:  09/26/17 245 lb 12.8 oz (111.5 kg)  02/07/17 237 lb 1.9 oz (107.6 kg)  12/09/15 235 lb 9.6 oz (106.9 kg)    General: Pleasant. Short of breath with movement. Alert and in no acute distress.  Weight is up 7 pounds.  HEENT: Normal.  Neck: Supple, no JVD, carotid bruits, or masses noted.  Cardiac: Irregular irregular rhythm. Rate is elevated. Heart tones are distant. Over 1+ edema. Looks generally swollen.  Respiratory:  Decreased breath sounds noted.   GI: Obese - distended.   MS: No deformity or atrophy. Gait not tested. Skin: Warm and dry. Color is normal.  Neuro:  Strength and sensation are intact and no gross focal deficits noted.  Psych: Alert, appropriate and with normal affect.   LABORATORY DATA:  EKG:  EKG is ordered today. This demonstrates AF with RVR - reviewed with Dr. Acie Fredrickson (DOD).  RecentLabs       Lab Results  Component Value Date   WBC 5.0 02/07/2017   HGB 14.5 02/07/2017   HCT 44.4 02/07/2017   PLT 146 (L) 02/07/2017   GLUCOSE 108 (H) 02/07/2017   CHOL 155 02/07/2017   TRIG 125 02/07/2017   HDL 36 (L) 02/07/2017   LDLCALC 94 02/07/2017   ALT 33 02/07/2017   AST 27 02/07/2017   NA 138 02/07/2017   K 3.6 02/07/2017   CL 103 02/07/2017   CREATININE 1.02 02/07/2017   BUN 16 02/07/2017   CO2 24 02/07/2017   INR 1.51 (H) 04/21/2015       BNP (last 3 results) RecentLabs(withinlast365days)  No results for input(s): BNP in the last 8760 hours.    ProBNP (last 3 results) RecentLabs(withinlast365days)  No results for input(s): PROBNP in the last 8760 hours.     Other Studies Reviewed Today:  XR CHEST PA AND  LATERAL September 22, 2017 Narrative:  INDICATION: Lower extremity edema. COMPARISON: Chest x-ray 08/21/2015. TECHNIQUE: Frontal and lateral chest radiographs performed.   FINDINGS:  Moderate right pleural effusion with extensive consolidation in right lower/right middle lobe.  Patchy opacities in left lower lobe. Cardiomegaly. No pneumothorax.   Impression:  IMPRESSION: 1. Moderate right pleural effusion with right lower/middle lobe consolidation. Minimal patchy opacities in left lower lobe. 2. Cardiomegaly.  Electronically Signed by: Leslie Dales   US VENOUS LOWER EXTREMITY BILATERAL September 23, 2017  TECHNIQUE: The veins of both lower extremities were interrogated from the visible common femoral vein to the distal popliteal vein. The junction of the greater saphenous with the common femoral vein as well as the posterior tibial veins were evaluated.  Gray scale, color, spectral, and doppler sonography was utilized. INDICATION: painful and swollen  COMPARISON: None.   FINDINGS:  - Deep vessels: Flow is present in all interrogated vessels and there are no internal echoes. Normal venous compressibility is demonstrated and there is augmentation of flow with appropriate maneuvers.  - Superficial vessels: No thrombophlebitis. - Misc: None  Impression:  IMPRESSION:  1. No evidence of deep venous thrombosis. 2. Right popliteal fossa Baker's cyst measuring 3.9 x 1.5 x 3.1 cm.  Electronically Signed by: Leslie Dales    Right/Left Heart Cath and Coronary Angiography2/2017  Conclusion   1. Mid LAD to Dist LAD lesion, 20% stenosed. 2. Mid RCA to Dist RCA lesion, 20% stenosed. 3. There is moderate to severe left ventricular systolic dysfunction.   Widely patent coronary arteries  Severe global hypokinesis with EF 25%. Elevated left ventricular end-diastolic pressure  Normal pulmonary artery pressures.  RECOMMENDATIONS:   No evidence of coronary artery disease  Aggressive  medical therapy for left ventricular systolic dysfunction.    Assessment/Plan: 1. Acute on chronic systolic HF - has known NICM - now with worsening symptoms - NYHA II/III. Has associated AF with RVR as well. He will need echo.   2. 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 Unfortunately, options now may be limited. This has been noted on CT scan from 2016and even in 2015- 7.7 cm right infrahilar mass.  3. Pleural effusion - may need thoracentesis  4. AF with RVR - no longer on Xarelto due to hemoptysis -  Discussed with Dr. Acie Fredrickson - will place on IV heparin - will need to follow closely.   5. Elevated PSA- has declined prior work up  6. Obesity  Current medicines are reviewed with the patient today.  The patient does not have concerns regarding medicines other than what has been noted above.  The following changes have been made:  See above.  Labs/ tests ordered today include:       Orders Placed This Encounter  Procedures  . EKG 12-Lead     Disposition:   Further disposition pending. Transported by EMS to the [facility] ER to begin plan of care - charge nurse "Mali" made aware.   Patient is agreeable to this plan and will call if any problems develop in the interim.   SignedTruitt Merle, NP  09/26/2017 8:43 AM  [facility] [address] [place],   [address] Phone: [phone] Fax: [phone]             Attending Note:   The patient was seen and examined.  Agree with assessment and plan as noted above.  Changes made to the above note as needed.  Patient seen and independently examined with  Truitt Merle, NP .   We discussed all aspects of the encounter. I agree with the assessment and plan as stated above.  1.  Acute on chronic combined systolic and  diastolic congestive heart failure: The patient presents with worsening symptoms of heart failure, leg edema, severe shortness of breath with exertion.  He has had symptoms that have been progressive for a while and now agrees to be hospitalized.  I agree that he needs to go in for IV Lasix.  He needs to be started on standard heart failure medications.  2.  Atrial fibrillation with rapid ventricular response: The patient has rapid atrial fibrillation.   Was previously on Xarelto but stopped it due to hemoptysis.  He has had lung cancer for the past several years of this is going to be a very difficult situation.  He is at high risk for stroke since he has cancer.  CHADS2VAC is 2   ( age , CHF )   Also has prostate cancer .    Agree with IV dilt - short term to quicky get better control of her HR .  Will transition to metaprolol  Po   3.  Lung cancer: He has a history of hemoptysis in the past.  We have him on heparin at present but will ask pulmonary to go by and evaluate him.  If he is at extremely bleeding, then will likely need to DC the heparin.   I have spent a total of 40 minutes with patient reviewing hospital  notes , telemetry, EKGs,  labs and examining patient as well as establishing an assessment and plan that was discussed with the patient. > 50% of time was spent in direct patient care.   Thayer Headings, Brooke Bonito., MD, [facility] 09/26/2017, 1:37 PM [address]  [place] - [phone] Pager [phone]        Procedures by Burtis Junes, NP at 09/26/2017 8:00 AM   Author: Burtis Junes, NP Author Type: Nurse Practitioner Filed: 09/26/2017 8:45 AM  Note Status: Signed Cosign: Cosign Not Required Encounter Date: 09/26/2017  Editor: Demaris Callander C  Procedure Orders:  1. EKG 12-Lead [[id number]] ordered by Burtis Junes, NP at 09/26/17 0810           Scan on 09/26/2017 8:45 AM by Demaris Callander C: [facility] on 09/26/2017  8:45 AM by Elberta Fortis [facility]EKG    Patient Instructions by Burtis Junes, NP at 09/26/2017 8:00 AM   Author: Burtis Junes, NP Author Type: Nurse Practitioner Filed: 09/26/2017 8:43 AM  Note Status: Addendum Cosign: Cosign Not Required Encounter Date: 09/26/2017  Editor: Burtis Junes, NP (Nurse Practitioner)  Prior Versions: 1. Burtis Junes, NP (Nurse Practitioner) at 09/26/2017 8:04 AM - Signed    We are admitting you to the hospital today.   Call the [place] office at [phone] if you have any questions, problems or concerns.            Instructions   We are admitting you to the hospital today.   Call the [place] office at [phone] if you have any questions, problems or concerns.           Communications      [facility] Provider CC Chart Rep sent to [name], MD      Chart Routed to Javious Hallisey, Wonda Cheng, MD     Media   Electronic signature on 09/26/2017 8:07 AM - Signed    Communication Routing History   Recipient Method Sent by Date Sent  [name], MD Fax Burtis Junes, NP 09/26/2017  Fax: [phone] Phone: [phone]     Orders Placed      EKG 12-Lead     Medication Changes      Sacubitril-Valsartan          (Change in therapy)  Patient taking differently:  Take 1 tablet by mouth 2 (two) times daily.       24-26 MG Tabs, 1 tablet Oral 2 times daily       Medication List    Visit Diagnoses      NICM (nonischemic cardiomyopathy) ([place])      Lung mass     Problem List    Level of Service   Level of Service  LOS - NO CHARGE [NC1]   All Charges for This Encounter   Code  NC1  Description: LOS - NO CHARGE  Service Date: 09/26/2017  Service Provider: Burtis Junes, NP  Qty: 1  [id number]  Description: PR ELECTROCARDIOGRAM, COMPLETE  Service Date: 09/26/2017  Service Provider: Burtis Junes,  NP  Qty: 1

## 2017-09-26 NOTE — Consult Note (Addendum)
Name: Logan [name] MRN: [id number] DOB: 1950-12-15    ADMISSION DATE:  09/26/2017 CONSULTATION DATE:  09/26/2017  REFERRING MD :  Dr. Tamala Julian  CHIEF COMPLAINT:  SOB/ leg swelling  HISTORY OF PRESENT ILLNESS:   67 year old male with PMH significant for NICM- EF 25-30%, PAF on low-dose aspirin, and OSA who presented for ongoing SOB and leg swelling.  Additionally he states quit smoking cigarettes in 1796 (smoked for ~8 years), then smoked cigars- quit 2 years, currently vaps CBD oil.  He reports compliance with CPAP at night and cleans often.  He works currently as a Management consultant.  He denies any prior known exposures of bird, mold, chemicals, or hot tubs.  Has no pets.  He has known history of a hypermetabolic right lower lung mass and left adrenal nodule noted on PET 03/2015 and elevated PSA (28.9 on 03/25/2015) however, has previously denied further workup for such.  Wife states that they have just been in denial but are now ready for further workup.    He denies recent weight loss, if any it has been weight gain.  Reports chronic hemoptysis for last 2.5 years.  Stated when he was taking Xarelto, he would cough up frank blood, therefore quit taking after 3 months.  He can not recall when he quit taking Xarelto.  He most recent hemoptysis was around 2.5 weeks ago and at that time would be streaked with bright red blood, mostly worse in the morning.  He started taking herbal supplement, Emphaplex, and since has no further hemoptysis.  Patient reports recent travel to [place] 7/15-25, to visit his children.  During visit, had URI but stated everyone else was sick as well.  Returned home and noticed progressive lower leg swelling.  States he has not been compliant with his sodium restriction.  Was seen at [facility] in ER on 7/12 for this and refused admission at that time and diuretics increased.  He was seen at cardiologist office this morning with progressive dyspnea and swelling and found to be  in Afib with RVR thus sent for admission to [facility].  He has been placed on Cardizem gtt for rate control and heparin gtt.   PCCM asked to consult for further evaluation of lung mass and ongoing hemoptysis.   PAST MEDICAL HISTORY :   has a past medical history of Chronic systolic heart failure ([place]) (04/15/2015), Lung mass (09/18/4126), Metabolic syndrome (09/18/6765), Obstructive sleep apnea (04/15/2015), Paroxysmal atrial fibrillation ([place]) (04/15/2015), Prostate cancer ([place]) (04/15/2015), and Thrombocytopenia ([place]) (04/15/2015).  has a past surgical history that includes Prostate biopsy and Cardiac catheterization (N/A, 04/27/2015).   Prior to Admission medications   Medication Sig Start Date End Date Taking? Authorizing Provider  aspirin 81 MG tablet Take 81 mg by mouth daily.   Yes [provider]  carvedilol (COREG) 12.5 MG tablet Take 1 tablet (12.5 mg total) by mouth 2 (two) times daily. 05/15/17  Yes Burtis Junes, NP  Cholecalciferol ([employer] VIT D 5000 HIGH-POTENCY) 5000 units capsule Take 5,000 Units by mouth daily.   Yes [provider]  furosemide (LASIX) 40 MG tablet Take 1 tablet (40 mg total) by mouth daily. 05/15/17  Yes Burtis Junes, NP  Multiple Vitamin (MULTIVITAMIN) tablet Take 1 tablet by mouth daily.   Yes [provider]  sacubitril-valsartan (ENTRESTO) 24-26 MG Take 1 tablet by mouth 2 (two) times daily.   Yes [provider]   No Known Allergies  FAMILY HISTORY:  family history includes  Hypertension in his father; Parkinson's disease in his mother. SOCIAL HISTORY:  reports that he quit smoking about 43 years ago. His smoking use included cigarettes, cigars, and pipe. He has never used smokeless tobacco. He reports that he does not drink alcohol or use drugs.  REVIEW OF SYSTEMS:  POSITIVES IN BOLD  Gen: Denies fever, chills, weight change/ gain, fatigue, night sweats HEENT: Denies blurred vision, double vision, hearing loss, tinnitus, sinus  congestion, rhinorrhea, sore throat, neck stiffness, dysphagia PULM: Denies shortness of breath, cough, sputum production, hemoptysis, wheezing CV: Denies chest pain, edema, orthopnea, paroxysmal nocturnal dyspnea, palpitations GI: Denies abdominal pain, nausea, vomiting, diarrhea, change in bowel habits GU: Denies dysuria, hematuria, polyuria, oliguria, urethral discharge Endocrine: Denies hot or cold intolerance, polyuria, polyphagia or appetite change Derm: Denies rash, dry skin or skin change Heme: Denies easy bruising Neuro: Denies headache, numbness, weakness, slurred speech, loss of memory or consciousness  SUBJECTIVE:   VITAL SIGNS: Temp:  [98.5 F (36.9 C)] 98.5 F (36.9 C) (07/16 0941) Pulse Rate:  [85-141] 108 (07/16 1400) Resp:  [23-35] 32 (07/16 1400) BP: (84-123)/(60-90) 108/78 (07/16 1400) SpO2:  [88 %-100 %] 94 % (07/16 1400) Weight:  [245 lb 12.8 oz (111.5 kg)] 245 lb 12.8 oz (111.5 kg) (07/16 0810)  PHYSICAL EXAMINATION: General:  Well nourished adult male sitting upright in bed in NAD HEENT: MM pink/moist, Neuro: Alert, oriented, non-focal CV: IRIR, afib rate 110, distant heart sounds PULM: even/non-labored, lungs bilaterally clear anteriorly, bibasilar rales, diminished in right base GI: obese, soft, non-tender, bs active  Extremities: warm/dry, +2 BLE edema  Skin: no rashes   Recent Labs  Lab 09/26/17 1102  NA 138  K 4.8  CL 98  CO2 32  BUN 17  CREATININE 1.22  GLUCOSE 115*   Recent Labs  Lab 09/26/17 0955  HGB 11.4*  HCT 34.0*  WBC 8.4  PLT 336     SIGNIFICANT EVENTS  7/16 Admit to cardiology   STUDIES:  CTA PE 03/2014 >>  neg for PE; A large right infrahilar soft tissue mass is identified measuring 7.7 x 6.1 cm in transaxial dimensions by 5.8 cm in craniocaudal height. This demonstrates heterogeneous enhancement. Mild diffuse groundglass densities are identified throughout the lungs;  mildly enlarged precarinal lymph node measuring 1.4  cm in short axis.  PET 04/07/2015  >> 1.  Hypermetabolic right lower lobe mass max SUV 13.2 measuring 6.2 x 7.9 cm (image      128). The mass previously measured 6.1 x 7.7 cm 2.  12 mm hypermetabolic left adrenal nodule (image 155). 3.  No other worrisome hypermetabolic activity is identified. 4.  No hypermetabolic 4 mm subpleural right upper lobe nodule (image 24) 2 small            accurately characterize by PET.  09/26/2017 CT chest without contrast >>  1. Large RIGHT lower lobe mass with central and peripheral calcifications occupies the near entirety of the RIGHT lower lobe and concerning for neoplasm. 2. Moderate RIGHT effusion. 3. Mild mediastinal and RIGHT hilar adenopathy. 4. Recommend FDG PET scan for biopsy planning/staging   ASSESSMENT / PLAN:  Right Lower lobe mass Hemoptysis Pleural effusion, right moderate, left mild OSA P:  - Continue supplemental O2 for sats > 92% - CPAP q HS per home settings - Consult IR for R thoracentesis- please send pleural fluid for studies including cytology, first to evaluate for malignancy as this would be the least invasive testing. - consider CT abd/ pelvis with  and without contrast for further evaluation left adrenal mass for possible biopsy as well, as this is likely a secondary site.  Will defer this at this time, given need for contrast will hold to maintain renal function for ongoing diuresis.  Dr. Lamonte Sakai spoke with radiologist, Dr. Toma Aran who states needs with/without contrast to look at adrenal glands. -If thoracentesis does not show any abnormality,  would then proceed with probable bronch for further evaluation and possible biopsy. - Will need out patient PET scan  Remainder per primary.  PCCM will continue to follow.   Kennieth Rad, AGACNP-BC [facility] Pgr: [phone] or if no answer [phone] 09/26/2017, 3:42 PM     Attending Note:  I have examined patient, reviewed labs, studies and notes. I  have discussed the case with B Simpson, and I agree with the data and plans as amended above.   67 year old man with OSA, atrial fibrillation and a nonischemic cardiomyopathy (EF 25 to 30%), elevated PSA (most recently 28.9 in January 2017)..  He has a known right lower lobe mass that was present dating back to 04/01/2013 CT chest (4 cm).  It had increased in size to 6.2 x 7.9 cm on PET scan 04/07/2015, was hypermetabolic (SUV 73.4) as was a 12 mm left adrenal nodule.  He has deferred any diagnostic testing or treatment.  He has formally been on Xarelto and is seen hemoptysis.  Most recent was about 2.5 weeks ago.  He has been admitted for atrial fibrillation, some cardiogenic edema and associated dyspnea.  He has been started on diuretics, rate control with diltiazem and started on a heparin drip for anticoagulation.  Part of his evaluation has included repeat CT chest without contrast done today which I have reviewed.  This confirms his right lower lobe consolidative mass that now measures 8.5 x 10 x 9.0 cm and feels most of the right lower lobe.  Interestingly there are areas of calcification.  Mildly enlarged mediastinal and right hilar lymph nodes present.  There is also a moderate right pleural effusion that appears to be free-flowing.   Vitals:   09/26/17 1430 09/26/17 1500 09/26/17 1515 09/26/17 1601  BP: (!) 125/93 (!) 117/92 119/90 118/85  Pulse: (!) 117 (!) 107 (!) 118   Resp: (!) 26 (!) 24 (!) 23 (!) 24  Temp:    97.8 F (36.6 C)  TempSrc:    Oral  SpO2: 95% 94% 95% 94%  Weight:    111.4 kg (245 lb 9.5 oz)  Height:    5\' 10"  (1.778 m)  well appearing man, NAD. Awake and alert, well-oriented. Irregularly irregular, no M. Decreased BS on R, clear on L. abd benign with + BS. 1+ LE edema.   He tells [place] that he is now willing to undertake an evaluation of his right lower lobe mass.  Recommend: -Diagnostic and therapeutic right thoracentesis under IR guidance when okay to hold his heparin.   Needs to be sent for cytology. -CT scan of his abdomen WITH AND WITHOUT contrast (IV and enteral, discussed with Dr Romelle Starcher) when it is okay for him to have contrast.  This may need to wait until after he has been aggressively diuresed to address his cardiology issues.  CT should give [place] some insight and to whether his left adrenal adenoma has evolved, whether he has a focal prostate abnormality.  Either of these may become targets for potential diagnostic testing. -Check PSA now -Depending on the work-up above, whether there  are any biopsy targets that would potentially establish stage IV disease, he may need to go for bronchoscopy for airway inspection and right lower lobe transbronchial biopsies, brushings, etc.  I discussed these plans and their rationale with him and with his wife.  They understand and agree.  If the thoracentesis is unrevealing then we will determine whether there is another distant biopsy site that would be easy to approach.  If not then I will arrange for bronchoscopy.  Baltazar Apo, MD, PhD 09/26/2017, 3:58 PM [facility] [phone] or if no answer [phone]

## 2017-09-26 NOTE — ED Notes (Signed)
2 grams sodium restriction meal tray ordered.

## 2017-09-26 NOTE — Progress Notes (Signed)
[place] for heparin Indication: atrial fibrillation  No Known Allergies  Patient Measurements: Height: 5\' 10"  (177.8 cm) Weight: 245 lb 9.5 oz (111.4 kg) IBW/kg (Calculated) : 73 Heparin Dosing Weight: 97kg  Vital Signs: Temp: 97.8 F (36.6 C) (07/16 1601) Temp Source: Oral (07/16 1601) BP: 118/85 (07/16 1601) Pulse Rate: 118 (07/16 1515)  Labs: Recent Labs    09/26/17 0955 09/26/17 1102 09/26/17 1559  HGB 11.4*  --   --   HCT 34.0*  --   --   PLT 336  --   --   APTT  --  31 71*  LABPROT  --  16.3* 16.5*  INR  --  1.32 1.35  HEPARINUNFRC  --   --  0.20*  CREATININE  --  1.22 1.10  TROPONINI  --  0.04* 0.03*    Estimated Creatinine Clearance: 81.5 mL/min (by C-G formula based on SCr of 1.1 mg/dL).   Medical History: Past Medical History:  Diagnosis Date  . CHF (congestive heart failure) ([place])   . Chronic systolic heart failure ([place]) 04/15/2015    EF 30-35% by echo 2016 , [facility]   . Hypertension   . Lung mass 04/15/2015    Never biopsied. History of PET scan that raised concern.   . Metabolic syndrome 11/16/1738  . OSA on CPAP   . Paroxysmal atrial fibrillation ([place]) 04/15/2015   On apixaban   . Prostate cancer ([place])   . Thrombocytopenia ([place]) 04/15/2015    Medications:  Infusions:  . sodium chloride    . diltiazem (CARDIZEM) infusion 7.5 mg/hr (09/26/17 1355)  . heparin      Assessment: 84 yom presented to the ED with afib with RVR from cardiologist office. To start IV heparin for anticoagulation. He has been on xarelto in the past but this was stopped. Labs are pending. Pt with mild thrombocytopenia in the past.   Heparin level this evening slightly below goal.  No overt bleeding or complications noted.  Goal of Therapy:  Heparin level 0.3-0.7 units/ml Monitor platelets by anticoagulation protocol: Yes   Plan:  Increase IV Heparin to 1650 units/hr Check heparin level in 8 hrs. Daily heparin level and  CBC.  Marguerite Olea, [facility] Clinical Pharmacist Phone [phone]  09/26/2017 6:15 PM

## 2017-09-26 NOTE — ED Triage Notes (Addendum)
Pt here from cardiologist office with a-fib RVR, dyspnea, ascites, and peripheral edema. Symptoms worsening over last week. Pt does not wear oxygen at home. 88% on RA for EMS. Reports dyspnea with "a few steps". Pt states "the fluid has built up and I can't take a full breath." Hx CHF. Given metoprolol by EMS 5mg .

## 2017-09-26 NOTE — ED Provider Notes (Addendum)
[facility] EMERGENCY DEPARTMENT Provider Note   CSN: [id number] Arrival date & time: 09/26/17  0086     History   Chief Complaint Chief Complaint  Patient presents with  . Atrial Fibrillation  . Ascites  . Shortness of Breath    HPI Logan [name] is a 67 y.o. male.  HPI 67 year old male with history of CHF, paroxysmal A. fib, lung mass that has not been evaluated comes in with chief complaint of worsening shortness of breath and leg swelling.  According to patient's wife, patient has been feeling unwell for the last several days.  They had recently made a trip to [place], and were around people who are sick and attributing his symptoms to flulike illness.  However over time he has had worsening of the leg swelling with pitting edema that has not responded to Lasix.  In addition patient is also been having more weakness and episodes of feeling short of breath.  Patient has been taking his medications as prescribed.  Cardiology service advised patient to come to the ER for admission and further evaluation.  Past Medical History:  Diagnosis Date  . Chronic systolic heart failure ([place]) 04/15/2015    EF 30-35% by echo 2016 , [facility]   . Lung mass 04/15/2015    Never biopsied. History of PET scan that raised concern.   . Metabolic syndrome 09/16/1949  . Obstructive sleep apnea 04/15/2015  . Paroxysmal atrial fibrillation ([place]) 04/15/2015   On apixaban   . Prostate cancer ([place]) 04/15/2015  . Thrombocytopenia ([place]) 04/15/2015    Patient Active Problem List   Diagnosis Date Noted  . Acute systolic heart failure ([place]) 09/26/2017  . Atrial fibrillation with RVR ([place]) 09/26/2017  . Chronic anticoagulation 05/22/2015  . Non-ischemic cardiomyopathy ([place]) 05/22/2015  . Abnormal nuclear stress test 04/27/2015  . Chronic systolic heart failure ([place]) 04/15/2015  . Paroxysmal atrial fibrillation ([place]) 04/15/2015  . Family history of heart disease 04/15/2015  . Prostate  cancer ([place]) 04/15/2015  . Thrombocytopenia ([place]) 04/15/2015  . 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 Lung mass 04/15/2015  . Obstructive sleep apnea 04/15/2015  . Metabolic syndrome 93/26/7124    Past Surgical History:  Procedure Laterality Date  . CARDIAC CATHETERIZATION N/A 04/27/2015   Procedure: Right/Left Heart Cath and Coronary Angiography;  Surgeon: [name], MD;  Location: [facility];  Service: Cardiovascular;  Laterality: N/A;  . PROSTATE BIOPSY          Home Medications    Prior to Admission medications   Medication Sig Start Date End Date Taking? Authorizing Provider  aspirin 81 MG tablet Take 81 mg by mouth daily.   Yes [provider]  carvedilol (COREG) 12.5 MG tablet Take 1 tablet (12.5 mg total) by mouth 2 (two) times daily. 05/15/17  Yes Burtis Junes, NP  Cholecalciferol ([employer] VIT D 5000 HIGH-POTENCY) 5000 units capsule Take 5,000 Units by mouth daily.   Yes [provider]  furosemide (LASIX) 40 MG tablet Take 1 tablet (40 mg total) by mouth daily. 05/15/17  Yes Burtis Junes, NP  Multiple Vitamin (MULTIVITAMIN) tablet Take 1 tablet by mouth daily.   Yes [provider]  sacubitril-valsartan (ENTRESTO) 24-26 MG Take 1 tablet by mouth 2 (two) times daily.   Yes [provider]    Family History Family History  Problem Relation Age of Onset  . Parkinson's disease Mother   . Hypertension Father     Social History Social History  Tobacco Use  . Smoking status: Former Smoker    Types: Cigarettes, Cigars, Pipe    Last attempt to quit: 03/14/1974    Years since quitting: 43.5  . Smokeless tobacco: Never Used  Substance Use Topics  . Alcohol use: No    Alcohol/week: 0.0 oz  . Drug use: No     Allergies   Patient has no known allergies.   Review of Systems Review of Systems  Constitutional: Positive for activity change and fatigue.  Respiratory: Positive for shortness of breath.     Cardiovascular: Negative for chest pain.  Gastrointestinal: Negative for abdominal pain.  Neurological: Positive for weakness.  All other systems reviewed and are negative.    Physical Exam Updated Vital Signs BP 100/63   Pulse (!) 112   Temp 98.5 F (36.9 C) (Oral)   Resp (!) 26   SpO2 97%   Physical Exam  Constitutional: He is oriented to person, place, and time. He appears well-developed.  HENT:  Head: Atraumatic.  Eyes: Pupils are equal, round, and reactive to light.  Neck: Neck supple.  Cardiovascular:  Tachycardia, irregularly irregular  Pulmonary/Chest: Effort normal. He has no wheezes. He has no rales.  Abdominal: Soft.  Musculoskeletal:       Right lower leg: He exhibits edema.       Left lower leg: He exhibits edema.  Neurological: He is alert and oriented to person, place, and time.  Skin: Skin is warm.  Nursing note and vitals reviewed.    ED Treatments / Results  Labs (all labs ordered are listed, but only abnormal results are displayed) Labs Reviewed  CBC WITH DIFFERENTIAL/PLATELET - Abnormal; Notable for the following components:      Result Value   RBC 3.80 (*)    Hemoglobin 11.4 (*)    HCT 34.0 (*)    All other components within normal limits  BRAIN NATRIURETIC PEPTIDE  TSH  TROPONIN I  TROPONIN I  HEPARIN LEVEL (UNFRACTIONATED)    EKG EKG Interpretation  Date/Time:  Tuesday September 26 2017 09:38:49 EDT Ventricular Rate:  133 PR Interval:    QRS Duration: 80 QT Interval:  335 QTC Calculation: 489 R Axis:   153 Text Interpretation:  Atrial fibrillation Ventricular premature complex Right axis deviation Borderline repolarization abnormality Borderline prolonged QT interval No old tracing to compare Confirmed by Varney Biles [phone]) on 09/26/2017 10:16:54 AM   Radiology Ct Chest Wo Contrast  Result Date: 09/26/2017 CLINICAL DATA:  Dyspnea, peripheral edema, symptoms worsening over last week, lung nodule, lung mass EXAM: CT CHEST  WITHOUT CONTRAST TECHNIQUE: Multidetector CT imaging of the chest was performed following the standard protocol without IV contrast. COMPARISON:  None FINDINGS: Cardiovascular: No significant vascular findings. Normal heart size. No pericardial effusion. Mediastinum/Nodes: Prominent mediastinal lymph nodes are mildly enlarged. For example RIGHT lower paratracheal lymph node measuring 13 mm short axis. RIGHT hilar lymph node measuring 12 mm short axis. Lungs/Pleura: Consolidative mass in the RIGHT lower lobe with central and peripheral calcifications is poorly marginated. Mass measures approximately 8.5 by 10 by 9.0 cm and occupies the majority of the RIGHT lower lobe. There is there is airspace disease in the medial RIGHT lower lobe with air bronchograms. Small effusion. Within the RIGHT upper lobe 6 mm peripheral nodule image 62/4. Small LEFT effusion. Upper Abdomen: Limited view of the liver, kidneys, pancreas are unremarkable. Normal adrenal glands. Musculoskeletal: Bulky osteophytes of the spine. No aggressive osseous lesion. IMPRESSION: 1. Large RIGHT lower lobe mass with central  and peripheral calcifications occupies the near entirety of the RIGHT lower lobe and concerning for neoplasm. 2. Moderate RIGHT effusion. 3. Mild mediastinal and RIGHT hilar adenopathy. 4. Recommend FDG PET scan for biopsy planning/staging These results will be called to the ordering clinician or representative by the Radiologist Assistant, and communication documented in the PACS or zVision Dashboard. Electronically Signed   By: Suzy Bouchard M.D.   On: 09/26/2017 10:43    Procedures Procedures (including critical care time)  CRITICAL CARE Performed by: Kylyn Mcdade   Total critical care time: 41 minutes - afib with RVR  Critical care time was exclusive of separately billable procedures and treating other patients.  Critical care was necessary to treat or prevent imminent or life-threatening  deterioration.  Critical care was time spent personally by me on the following activities: development of treatment plan with patient and/or surrogate as well as nursing, discussions with consultants, evaluation of patient's response to treatment, examination of patient, obtaining history from patient or surrogate, ordering and performing treatments and interventions, ordering and review of laboratory studies, ordering and review of radiographic studies, pulse oximetry and re-evaluation of patient's condition.   Medications Ordered in ED Medications  sodium chloride flush (NS) 0.9 % injection 3 mL (has no administration in time range)  sodium chloride flush (NS) 0.9 % injection 3 mL (has no administration in time range)  0.9 %  sodium chloride infusion (has no administration in time range)  acetaminophen (TYLENOL) tablet 650 mg (has no administration in time range)  ondansetron (ZOFRAN) injection 4 mg (has no administration in time range)  furosemide (LASIX) injection 60 mg (has no administration in time range)  sacubitril-valsartan (ENTRESTO) 24-26 mg per tablet (has no administration in time range)  carvedilol (COREG) tablet 12.5 mg (has no administration in time range)  aspirin EC tablet 81 mg (has no administration in time range)  diltiazem (CARDIZEM) 1 mg/mL load via infusion 20 mg (has no administration in time range)  diltiazem (CARDIZEM) 100 mg in dextrose 5% 142mL (1 mg/mL) infusion (has no administration in time range)  heparin bolus via infusion 5,000 Units (has no administration in time range)  heparin ADULT infusion 100 units/mL (25000 units/280mL sodium chloride 0.45%) (has no administration in time range)     Initial Impression / Assessment and Plan / ED Course  I have reviewed the triage vital signs and the nursing notes.  Pertinent labs & imaging results that were available during my care of the patient were reviewed by me and considered in my medical decision making (see  chart for details).    67 year old male comes in with chief complaint of weakness and worsening pitting edema.  He also has had intermittent shortness of breath.  Patient has paroxysmal A. fib, and is noted to be in A. fib with RVR.  He informs me that he has been taking his medications as prescribed.  Patient is not anticoagulated.  Differential diagnosis for paroxysmal A. fib that is now in RVR and decompensated includes PE, especially given his mass.  Cardiology team has seen the patient.  We will start him on IV dilt and heparin.  They will admit the patient. Someone from cardiology team had already ordered CT chest without contrast.  I will ask him if we need to get a CT angios contrast to eval for PE.  This patients CHA2DS2-VASc Score and unadjusted Ischemic Stroke Rate (% per year) is equal to 2.2 % stroke rate/year from a score of 2  Above  score calculated as 1 point each if present [CHF, HTN, DM, Vascular=MI/PAD/Aortic Plaque, Age if 65-74, or Male] Above score calculated as 2 points each if present [Age > 75, or Stroke/TIA/TE]    Final Clinical Impressions(s) / ED Diagnoses   Final diagnoses:  Atrial fibrillation with RVR ([place])  Lung mass  Acute on chronic combined systolic and diastolic congestive heart failure [facility])    ED Discharge Orders    None       Varney Biles, MD 09/26/17 [place], Mavrick Mcquigg, MD 10/22/17 1926

## 2017-09-26 NOTE — ED Notes (Signed)
ED Provider at bedside. 

## 2017-09-26 NOTE — ED Notes (Signed)
Pt transported by RN and tech to 4E.

## 2017-09-26 NOTE — Progress Notes (Signed)
ANTICOAGULATION CONSULT NOTE - Initial Consult  Pharmacy Consult for heparin Indication: atrial fibrillation  No Known Allergies  Patient Measurements:   Heparin Dosing Weight: 97kg  Vital Signs: Temp: 98.5 F (36.9 C) (07/16 0941) Temp Source: Oral (07/16 0941) BP: 100/63 (07/16 0946) Pulse Rate: 112 (07/16 0946)  Labs: No results for input(s): HGB, HCT, PLT, APTT, LABPROT, INR, HEPARINUNFRC, HEPRLOWMOCWT, CREATININE, CKTOTAL, CKMB, TROPONINI in the last 72 hours.  CrCl cannot be calculated (Patient's most recent lab result is older than the maximum 21 days allowed.).   Medical History: Past Medical History:  Diagnosis Date  . Chronic systolic heart failure ([place]) 04/15/2015    EF 30-35% by echo 2016 , [facility]   . Lung mass 04/15/2015    Never biopsied. History of PET scan that raised concern.   . Metabolic syndrome 11/16/3274  . Obstructive sleep apnea 04/15/2015  . Paroxysmal atrial fibrillation ([place]) 04/15/2015   On apixaban   . Prostate cancer ([place]) 04/15/2015  . Thrombocytopenia ([place]) 04/15/2015    Medications:  Infusions:  . sodium chloride    . diltiazem (CARDIZEM) infusion    . heparin      Assessment: 67 yom presented to the ED with afib with RVR from cardiologist office. To start IV heparin for anticoagulation. He has been on xarelto in the past but this was stopped. Labs are pending. Pt with mild thrombocytopenia in the past.   Goal of Therapy:  Heparin level 0.3-0.7 units/ml Monitor platelets by anticoagulation protocol: Yes   Plan:  Heparin bolus 5000 units IV x 1 Heparin gtt 1400 units/hr Check a 6 hr heparin level Daily heparin level and CBC Will follow-up pending baseline labs  Jeran Hiltz, Rande Lawman 09/26/2017,10:04 AM

## 2017-09-27 ENCOUNTER — Inpatient Hospital Stay (HOSPITAL_COMMUNITY): Payer: 59

## 2017-09-27 ENCOUNTER — Encounter (HOSPITAL_COMMUNITY): Payer: Self-pay | Admitting: Student

## 2017-09-27 DIAGNOSIS — R9389 Abnormal findings on diagnostic imaging of other specified body structures: Secondary | ICD-10-CM

## 2017-09-27 HISTORY — PX: IR THORACENTESIS ASP PLEURAL SPACE W/IMG GUIDE: IMG5380

## 2017-09-27 LAB — BASIC METABOLIC PANEL
Anion gap: 10 (ref 5–15)
BUN: 17 mg/dL (ref 8–23)
CO2: 27 mmol/L (ref 22–32)
Calcium: 8.3 mg/dL — ABNORMAL LOW (ref 8.9–10.3)
Chloride: 100 mmol/L (ref 98–111)
Creatinine, Ser: 0.95 mg/dL (ref 0.61–1.24)
GFR calc Af Amer: >60 mL/min (ref >60)
GFR calc non Af Amer: >60 mL/min (ref >60)
Glucose, Bld: 109 mg/dL — ABNORMAL HIGH (ref 70–99)
Potassium: 4.7 mmol/L (ref 3.5–5.1)
Sodium: 137 mmol/L (ref 135–145)

## 2017-09-27 LAB — CBC
HCT: 37.6 % — ABNORMAL LOW (ref 39.0–52.0)
Hemoglobin: 12 g/dL — ABNORMAL LOW (ref 13.0–17.0)
MCH: 29.8 pg (ref 26.0–34.0)
MCHC: 31.9 g/dL (ref 30.0–36.0)
MCV: 93.3 fL (ref 78.0–100.0)
Platelets: 256 10*3/uL (ref 150–400)
RBC: 4.03 MIL/uL — ABNORMAL LOW (ref 4.22–5.81)
RDW: 14.4 % (ref 11.5–15.5)
WBC: 7.3 10*3/uL (ref 4.0–10.5)

## 2017-09-27 LAB — HEPARIN LEVEL (UNFRACTIONATED)
HEPARIN UNFRACTIONATED: 0.26 [IU]/mL — AB (ref 0.30–0.70)
HEPARIN UNFRACTIONATED: 0.38 [IU]/mL (ref 0.30–0.70)

## 2017-09-27 MED ORDER — AMIODARONE HCL IN DEXTROSE 360-4.14 MG/200ML-% IV SOLN
30.0000 mg/h | INTRAVENOUS | Status: DC
  Administered 2017-09-27 – 2017-09-29 (×2): 30 mg/h via INTRAVENOUS
  Filled 2017-09-27 (×3): qty 200

## 2017-09-27 MED ORDER — AMIODARONE LOAD VIA INFUSION
150.0000 mg | Freq: Once | INTRAVENOUS | Status: AC
  Administered 2017-09-27: 150 mg via INTRAVENOUS
  Filled 2017-09-27: qty 83.34

## 2017-09-27 MED ORDER — LIDOCAINE HCL (PF) 2 % IJ SOLN
INTRAMUSCULAR | Status: AC | PRN
  Administered 2017-09-27: 10 mL

## 2017-09-27 MED ORDER — CARVEDILOL 6.25 MG PO TABS
6.2500 mg | ORAL_TABLET | Freq: Two times a day (BID) | ORAL | Status: DC
  Administered 2017-09-27 – 2017-09-28 (×2): 6.25 mg via ORAL
  Filled 2017-09-27 (×3): qty 1

## 2017-09-27 MED ORDER — AMIODARONE HCL IN DEXTROSE 360-4.14 MG/200ML-% IV SOLN
60.0000 mg/h | INTRAVENOUS | Status: AC
  Administered 2017-09-27 (×2): 60 mg/h via INTRAVENOUS
  Filled 2017-09-27 (×2): qty 200

## 2017-09-27 MED ORDER — FUROSEMIDE 10 MG/ML IJ SOLN
60.0000 mg | Freq: Three times a day (TID) | INTRAMUSCULAR | Status: DC
  Administered 2017-09-27 – 2017-10-02 (×13): 60 mg via INTRAVENOUS
  Filled 2017-09-27 (×13): qty 6

## 2017-09-27 MED ORDER — IOHEXOL 300 MG/ML  SOLN
100.0000 mL | Freq: Once | INTRAMUSCULAR | Status: AC | PRN
  Administered 2017-09-27: 100 mL via INTRAVENOUS

## 2017-09-27 MED ORDER — LIDOCAINE HCL (PF) 2 % IJ SOLN
INTRAMUSCULAR | Status: AC
  Filled 2017-09-27: qty 20

## 2017-09-27 NOTE — Progress Notes (Signed)
ANTICOAGULATION CONSULT NOTE  Pharmacy Consult for Heparin Indication: atrial fibrillation  No Known Allergies  Patient Measurements: Height: 5\' 10"  (177.8 cm) Weight: 240 lb 15.4 oz (109.3 kg) IBW/kg (Calculated) : 73 Heparin Dosing Weight: 97kg  Vital Signs: Temp: 97.7 F (36.5 C) (07/17 0825) Temp Source: Oral (07/17 0542) BP: 98/68 (07/17 0542)  Labs: Recent Labs    09/26/17 0955 09/26/17 1102 09/26/17 1559 09/26/17 2131 09/27/17 0242 09/27/17 1206  HGB 11.4*  --   --   --  12.0*  --   HCT 34.0*  --   --   --  37.6*  --   PLT 336  --   --   --  256  --   APTT  --  31 71*  --   --   --   LABPROT  --  16.3* 16.5*  --   --   --   INR  --  1.32 1.35  --   --   --   HEPARINUNFRC  --   --  0.20*  --  0.26* 0.38  CREATININE  --  1.22 1.10  --  0.95  --   TROPONINI  --  0.04* 0.03* <0.03  --   --     Estimated Creatinine Clearance: 93.4 mL/min (by C-G formula based on SCr of 0.95 mg/dL).   Medical History: Past Medical History:  Diagnosis Date  . CHF (congestive heart failure) ([place])   . Chronic systolic heart failure ([place]) 04/15/2015    EF 30-35% by echo 2016 , [facility]   . Hypertension   . Lung mass 04/15/2015    Never biopsied. History of PET scan that raised concern.   . Metabolic syndrome 03/19/1094  . OSA on CPAP   . Paroxysmal atrial fibrillation ([place]) 04/15/2015   On apixaban   . Prostate cancer ([place])   . Thrombocytopenia ([place]) 04/15/2015    Medications:  Infusions:  . sodium chloride    . diltiazem (CARDIZEM) infusion 7.5 mg/hr (09/27/17 0732)  . heparin 1,850 Units/hr (09/27/17 0524)    Assessment: 83 yom presented to the ED with afib with RVR from cardiologist office. To start IV heparin for anticoagulation. He has been on xarelto in the past but this was stopped. Hgb stable.   Heparin level is therapeutic on 1850 units/hr.   Goal of Therapy:  Heparin level 0.3-0.7 units/ml Monitor platelets by anticoagulation protocol: Yes   Plan:  Continue IV  Heparin to 1850 units/hr Daily heparin level and CBC  Sloan Leiter, PharmD, BCPS, BCCCP Clinical Pharmacist Clinical phone 09/27/2017 until 3:30PM - #[phone] Please refer to [facility] and [facility] for clinical pharmacy numbers.  09/27/2017, 12:47 PM

## 2017-09-27 NOTE — Progress Notes (Signed)
DAILY PROGRESS NOTE   Patient Name: Logan [name] Date of Encounter: 09/27/2017  Chief Complaint   Shortness of breath  Patient Profile   67 yo male with history of nonischemic cardiomyopathy, EF 25 to 30%, PAF and known lung mass, presents with acute decompensated systolic congestive heart failure and pleural effusion, admitted for diuresis and thoracentesis.  Subjective   Logan [name] underwent successful right thoracentesis yesterday yielding 700 mL of dark yellow fluid.  He reports some improvement in shortness of breath today.  He is recorded diuresis of about 1 L negative, weight however is down 5 pounds.  Echo yesterday shows EF is 10 to 15% with severe global hypokinesis and a small pericardial effusion.  There is moderate biatrial enlargement.  The RV was severely dilated with severe RV dysfunction.  He was also noted to be in rapid A. fib in the office. He is now desiring "all medical therapy".   Objective   Vitals:   09/26/17 1601 09/26/17 2033 09/27/17 0542 09/27/17 0825  BP: 118/85 91/66 98/68   Pulse:  (!) 119    Resp: (!) 24 (!) 29    Temp: 97.8 F (36.6 C) 98.6 F (37 C) 98 F (36.7 C) 97.7 F (36.5 C)  TempSrc: Oral Oral Oral   SpO2: 94% 91% 90%   Weight: 245 lb 9.5 oz (111.4 kg)  240 lb 15.4 oz (109.3 kg)   Height: 5' 10" (1.778 m)       Intake/Output Summary (Last 24 hours) at 09/27/2017 1315 Last data filed at 09/27/2017 0900 Gross per 24 hour  Intake 529.18 ml  Output 1650 ml  Net -1120.82 ml   Filed Weights   09/26/17 1601 09/27/17 0542  Weight: 245 lb 9.5 oz (111.4 kg) 240 lb 15.4 oz (109.3 kg)    Physical Exam   General appearance: alert and no distress Neck: JVD - a few cm above sternal notch, no carotid bruit, supple, symmetrical, trachea midline and thyroid not enlarged, symmetric, no tenderness/mass/nodules Lungs: diminished breath sounds RLL Heart: irregularly irregular rhythm and tachycardic Abdomen: soft, non-tender; bowel  sounds normal; no masses,  no organomegaly and mildly protuberant Extremities: edema 1+ LE pitting edema bilterally Pulses: 2+ and symmetric Skin: Skin color, texture, turgor normal. No rashes or lesions Neurologic: Grossly normal Psych: Pleasant  Inpatient Medications    Scheduled Meds: . aspirin EC  81 mg Oral Daily  . carvedilol  12.5 mg Oral BID WC  . furosemide  60 mg Intravenous BID  . lidocaine      . sacubitril-valsartan  1 tablet Oral BID  . sodium chloride flush  3 mL Intravenous Q12H    Continuous Infusions: . sodium chloride    . diltiazem (CARDIZEM) infusion 7.5 mg/hr (09/27/17 0732)  . heparin 1,850 Units/hr (09/27/17 1253)    PRN Meds: sodium chloride, acetaminophen, ondansetron (ZOFRAN) IV, sodium chloride flush   Labs   Results for orders placed or performed during the hospital encounter of 09/26/17 (from the past 48 hour(s))  CBC WITH DIFFERENTIAL     Status: Abnormal   Collection Time: 09/26/17  9:55 AM  Result Value Ref Range   WBC 8.4 4.0 - 10.5 K/uL   RBC 3.80 (L) 4.22 - 5.81 MIL/uL   Hemoglobin 11.4 (L) 13.0 - 17.0 g/dL   HCT 34.0 (L) 39.0 - 52.0 %   MCV 89.5 78.0 - 100.0 fL   MCH 30.0 26.0 - 34.0 pg   MCHC 33.5 30.0 - 36.0 g/dL  RDW 13.7 11.5 - 15.5 %   Platelets 336 150 - 400 K/uL   Neutrophils Relative % 69 %   Neutro Abs 5.9 1.7 - 7.7 K/uL   Lymphocytes Relative 22 %   Lymphs Abs 1.8 0.7 - 4.0 K/uL   Monocytes Relative 8 %   Monocytes Absolute 0.7 0.1 - 1.0 K/uL   Eosinophils Relative 0 %   Eosinophils Absolute 0.0 0.0 - 0.7 K/uL   Basophils Relative 0 %   Basophils Absolute 0.0 0.0 - 0.1 K/uL   Immature Granulocytes 1 %   Abs Immature Granulocytes 0.1 0.0 - 0.1 K/uL    Comment: Performed at [facility] [address], [place], [place] [address]  Brain natriuretic peptide     Status: Abnormal   Collection Time: 09/26/17  9:55 AM  Result Value Ref Range   B Natriuretic Peptide 537.4 (H) 0.0 - 100.0 pg/mL    Comment:  Performed at [facility] [address], [place], [place] [address]  TSH     Status: None   Collection Time: 09/26/17  9:55 AM  Result Value Ref Range   TSH 2.168 0.350 - 4.500 uIU/mL    Comment: Performed by a 3rd Generation assay with a functional sensitivity of <=0.01 uIU/mL. Performed at [facility], [place] [address] [place], [place] [address]   Comprehensive metabolic panel     Status: Abnormal   Collection Time: 09/26/17 11:02 AM  Result Value Ref Range   Sodium 138 135 - 145 mmol/L   Potassium 4.8 3.5 - 5.1 mmol/L   Chloride 98 98 - 111 mmol/L    Comment: Please note change in reference range.   CO2 32 22 - 32 mmol/L   Glucose, Bld 115 (H) 70 - 99 mg/dL    Comment: Please note change in reference range.   BUN 17 8 - 23 mg/dL    Comment: Please note change in reference range.   Creatinine, Ser 1.22 0.61 - 1.24 mg/dL   Calcium 8.8 (L) 8.9 - 10.3 mg/dL   Total Protein 7.3 6.5 - 8.1 g/dL   Albumin 2.6 (L) 3.5 - 5.0 g/dL   AST 35 15 - 41 U/L   ALT 45 (H) 0 - 44 U/L    Comment: Please note change in reference range.   Alkaline Phosphatase 94 38 - 126 U/L   Total Bilirubin 0.8 0.3 - 1.2 mg/dL   GFR calc non Af Amer 60 (L) >60 mL/min   GFR calc Af Amer >60 >60 mL/min    Comment: (NOTE) The eGFR has been calculated using the CKD EPI equation. This calculation has not been validated in all clinical situations. eGFR's persistently <60 mL/min signify possible Chronic Kidney Disease.    Anion gap 8 5 - 15    Comment: Performed at [facility] [address], [place], [place] [address]  Magnesium     Status: None   Collection Time: 09/26/17 11:02 AM  Result Value Ref Range   Magnesium 2.3 1.7 - 2.4 mg/dL    Comment: Performed at [facility] [address] [place], [place] [address]  Protime-INR     Status: Abnormal   Collection Time: 09/26/17 11:02 AM  Result Value Ref Range   Prothrombin Time 16.3 (H) 11.4 - 15.2 seconds   INR 1.32     Comment:  Performed at [facility] [address], [place], [place] [address]  APTT  Status: None   Collection Time: 09/26/17 11:02 AM  Result Value Ref Range   aPTT 31 24 - 36 seconds    Comment: Performed at [facility] [address], [place], [place] [address]  Troponin I     Status: Abnormal   Collection Time: 09/26/17 11:02 AM  Result Value Ref Range   Troponin I 0.04 (HH) <0.03 ng/mL    Comment: CRITICAL RESULT CALLED TO, READ BACK BY AND VERIFIED WITH: Patterson Hammersmith AT 1206 09/26/17 BY L BENFIELD Performed at [facility], [place] [address] [place], [place] [address]   Heparin level (unfractionated)     Status: Abnormal   Collection Time: 09/26/17  3:59 PM  Result Value Ref Range   Heparin Unfractionated 0.20 (L) 0.30 - 0.70 IU/mL    Comment: (NOTE) If heparin results are below expected values, and patient dosage has  been confirmed, suggest follow up testing of antithrombin III levels. Performed at [facility], [place] [address] [place], [place] [address]   APTT     Status: Abnormal   Collection Time: 09/26/17  3:59 PM  Result Value Ref Range   aPTT 71 (H) 24 - 36 seconds    Comment:        IF BASELINE aPTT IS ELEVATED, SUGGEST PATIENT RISK ASSESSMENT BE USED TO DETERMINE APPROPRIATE ANTICOAGULANT THERAPY. Performed at [facility], [place] [address] [place], [place] [address]   Magnesium     Status: None   Collection Time: 09/26/17  3:59 PM  Result Value Ref Range   Magnesium 2.1 1.7 - 2.4 mg/dL    Comment: Performed at [facility] [address], [place], [place] [address]  Comprehensive metabolic panel     Status: Abnormal   Collection Time: 09/26/17  3:59 PM  Result Value Ref Range   Sodium 138 135 - 145 mmol/L   Potassium 4.3 3.5 - 5.1 mmol/L   Chloride 98 98 - 111 mmol/L    Comment: Please note change in reference range.   CO2 30 22 - 32 mmol/L   Glucose, Bld 117 (H) 70 - 99 mg/dL    Comment: Please note change in reference  range.   BUN 16 8 - 23 mg/dL    Comment: Please note change in reference range.   Creatinine, Ser 1.10 0.61 - 1.24 mg/dL   Calcium 8.5 (L) 8.9 - 10.3 mg/dL   Total Protein 7.2 6.5 - 8.1 g/dL   Albumin 2.7 (L) 3.5 - 5.0 g/dL   AST 32 15 - 41 U/L   ALT 46 (H) 0 - 44 U/L    Comment: Please note change in reference range.   Alkaline Phosphatase 89 38 - 126 U/L   Total Bilirubin 0.8 0.3 - 1.2 mg/dL   GFR calc non Af Amer >60 >60 mL/min   GFR calc Af Amer >60 >60 mL/min    Comment: (NOTE) The eGFR has been calculated using the CKD EPI equation. This calculation has not been validated in all clinical situations. eGFR's persistently <60 mL/min signify possible Chronic Kidney Disease.    Anion gap 10 5 - 15    Comment: Performed at [facility] [address], [place], [place] [address]  Troponin I     Status: Abnormal   Collection Time: 09/26/17  3:59 PM  Result Value Ref Range   Troponin I 0.03 (HH) <0.03 ng/mL    Comment: CRITICAL VALUE NOTED.  VALUE IS CONSISTENT  WITH PREVIOUSLY REPORTED AND CALLED VALUE. Performed at [facility], [place] [address] [place], [place] [address]   Protime-INR     Status: Abnormal   Collection Time: 09/26/17  3:59 PM  Result Value Ref Range   Prothrombin Time 16.5 (H) 11.4 - 15.2 seconds   INR 1.35     Comment: Performed at [facility] [address], [place], [place] [address]  Troponin I     Status: None   Collection Time: 09/26/17  9:31 PM  Result Value Ref Range   Troponin I <0.03 <0.03 ng/mL    Comment: Performed at [facility] [address], [place], [place] [address]  PSA     Status: Abnormal   Collection Time: 09/26/17  9:31 PM  Result Value Ref Range   Prostatic Specific Antigen 84.83 (H) 0.00 - 4.00 ng/mL    Comment: (NOTE) While PSA levels of <=4.0 ng/ml are reported as reference range, some men with levels below 4.0 ng/ml can have prostate cancer and many men with PSA above 4.0 ng/ml do not have prostate  cancer.  Other tests such as free PSA, age specific reference ranges, PSA velocity and PSA doubling time may be helpful especially in men less than 6 years old. Performed at [facility], [place] [address] [place], [place] [address]   Basic metabolic panel     Status: Abnormal   Collection Time: 09/27/17  2:42 AM  Result Value Ref Range   Sodium 137 135 - 145 mmol/L   Potassium 4.7 3.5 - 5.1 mmol/L   Chloride 100 98 - 111 mmol/L    Comment: Please note change in reference range.   CO2 27 22 - 32 mmol/L   Glucose, Bld 109 (H) 70 - 99 mg/dL    Comment: Please note change in reference range.   BUN 17 8 - 23 mg/dL    Comment: Please note change in reference range.   Creatinine, Ser 0.95 0.61 - 1.24 mg/dL   Calcium 8.3 (L) 8.9 - 10.3 mg/dL   GFR calc non Af Amer >60 >60 mL/min   GFR calc Af Amer >60 >60 mL/min    Comment: (NOTE) The eGFR has been calculated using the CKD EPI equation. This calculation has not been validated in all clinical situations. eGFR's persistently <60 mL/min signify possible Chronic Kidney Disease.    Anion gap 10 5 - 15    Comment: Performed at [facility] [address], [place], [place] [address]  CBC     Status: Abnormal   Collection Time: 09/27/17  2:42 AM  Result Value Ref Range   WBC 7.3 4.0 - 10.5 K/uL   RBC 4.03 (L) 4.22 - 5.81 MIL/uL   Hemoglobin 12.0 (L) 13.0 - 17.0 g/dL   HCT 37.6 (L) 39.0 - 52.0 %   MCV 93.3 78.0 - 100.0 fL   MCH 29.8 26.0 - 34.0 pg   MCHC 31.9 30.0 - 36.0 g/dL   RDW 14.4 11.5 - 15.5 %   Platelets 256 150 - 400 K/uL    Comment: Performed at [facility], [place] [address] [place], [place] [address]  Heparin level (unfractionated)     Status: Abnormal   Collection Time: 09/27/17  2:42 AM  Result Value Ref Range   Heparin Unfractionated 0.26 (L) 0.30 - 0.70 IU/mL    Comment: (NOTE) If heparin results are below expected values, and patient dosage has  been confirmed, suggest  follow up testing of  antithrombin III levels. Performed at [facility], [place] [address], [place], [place] [address]   Heparin level (unfractionated)     Status: None   Collection Time: 09/27/17 12:06 PM  Result Value Ref Range   Heparin Unfractionated 0.38 0.30 - 0.70 IU/mL    Comment: (NOTE) If heparin results are below expected values, and patient dosage has  been confirmed, suggest follow up testing of antithrombin III levels. Performed at [facility], [place] [address], [place], [place] [address]     ECG   A-fib with RVR at 109 - Personally Reviewed  Telemetry   A-fib with RVR, PVC's - Personally Reviewed  Radiology    Dg Chest 1 View  Result Date: 09/27/2017 CLINICAL DATA:  Status post right thoracentesis today. EXAM: CHEST  1 VIEW COMPARISON:  Single-view of the chest earlier today. CT chest 09/26/2017. FINDINGS: Right pleural effusion is decreased after thoracentesis. No pneumothorax. No other change. IMPRESSION: Decreased right pleural effusion after thoracentesis. Negative for pneumothorax. Persistent right basilar opacity consistent with residual pleural fluid and pulmonary mass. Electronically Signed   By: Inge Rise M.D.   On: 09/27/2017 12:03   Dg Chest 2 View  Result Date: 09/27/2017 CLINICAL DATA:  Worsening shortness of breath and bilateral lower extremity swelling over the past several days. EXAM: CHEST - 2 VIEW COMPARISON:  CT chest 09/26/2017. FINDINGS: Right pleural effusion and dense opacity in the right lower chest consistent with mass seen on the prior CT is unchanged. A very small left pleural effusion and mild basilar atelectasis noted. Heart size is enlarged. No pneumothorax. No acute bony abnormality. IMPRESSION: No change in a right pleural effusion and right lower lobe opacity consistent with the mass lesion seen on prior CT. Cardiomegaly without edema. Tiny left pleural effusion and mild basilar atelectasis. Electronically Signed   By: Inge Rise M.D.    On: 09/27/2017 10:58   Ct Chest Wo Contrast  Result Date: 09/26/2017 CLINICAL DATA:  Dyspnea, peripheral edema, symptoms worsening over last week, lung nodule, lung mass EXAM: CT CHEST WITHOUT CONTRAST TECHNIQUE: Multidetector CT imaging of the chest was performed following the standard protocol without IV contrast. COMPARISON:  None FINDINGS: Cardiovascular: No significant vascular findings. Normal heart size. No pericardial effusion. Mediastinum/Nodes: Prominent mediastinal lymph nodes are mildly enlarged. For example RIGHT lower paratracheal lymph node measuring 13 mm short axis. RIGHT hilar lymph node measuring 12 mm short axis. Lungs/Pleura: Consolidative mass in the RIGHT lower lobe with central and peripheral calcifications is poorly marginated. Mass measures approximately 8.5 by 10 by 9.0 cm and occupies the majority of the RIGHT lower lobe. There is there is airspace disease in the medial RIGHT lower lobe with air bronchograms. Small effusion. Within the RIGHT upper lobe 6 mm peripheral nodule image 62/4. Small LEFT effusion. Upper Abdomen: Limited view of the liver, kidneys, pancreas are unremarkable. Normal adrenal glands. Musculoskeletal: Bulky osteophytes of the spine. No aggressive osseous lesion. IMPRESSION: 1. Large RIGHT lower lobe mass with central and peripheral calcifications occupies the near entirety of the RIGHT lower lobe and concerning for neoplasm. 2. Moderate RIGHT effusion. 3. Mild mediastinal and RIGHT hilar adenopathy. 4. Recommend FDG PET scan for biopsy planning/staging These results will be called to the ordering clinician or representative by the Radiologist Assistant, and communication documented in the PACS or zVision Dashboard. Electronically Signed   By: Suzy Bouchard M.D.   On: 09/26/2017 10:43   Ir Thoracentesis Asp Pleural  Space W/img Guide  Result Date: 09/27/2017 INDICATION: Symptomatic right pleural effusion - request for diagnostic thoracentesis EXAM:  ULTRASOUND GUIDED RIGHT THORACENTESIS MEDICATIONS: 10 mL 2 % Lidocaine. COMPLICATIONS: None immediate. PROCEDURE: An ultrasound guided thoracentesis was thoroughly discussed with the patient and questions answered. The benefits, risks, alternatives and complications were also discussed. The patient understands and wishes to proceed with the procedure. Written consent was obtained. Ultrasound was performed to localize and mark an adequate pocket of fluid in the right chest. The area was then prepped and draped in the normal sterile fashion. 2% Lidocaine was used for local anesthesia. Under ultrasound guidance a 6 Fr Safe-T-Centesis catheter was introduced. Thoracentesis was performed. The catheter was removed and a dressing applied. FINDINGS: A total of approximately 700 mL of golden yellow fluid was removed. Samples were sent to the laboratory as requested by the clinical team. IMPRESSION: Successful ultrasound guided right thoracentesis yielding 700 mL of pleural fluid. Read by Candiss Norse, PA-C Electronically Signed   By: Jacqulynn Cadet M.D.   On: 09/27/2017 11:27    Cardiac Studies   LV EF: 10% -   15%  ------------------------------------------------------------------- Indications:      CHF - 428.0.  ------------------------------------------------------------------- History:   PMH:   Non-ischemic cardiomyopathy.  Congestive heart failure.  ------------------------------------------------------------------- Study Conclusions  - Left ventricle: The cavity size was moderately dilated. Wall   thickness was normal. There was adverse spherical remodeling. The   estimated ejection fraction was in the range of 10% to 15%.   Severe diffuse hypokinesis with no identifiable regional   variations. - Mitral valve: There was mild to moderate regurgitation directed   posteriorly. - Left atrium: The atrium was moderately dilated. - Right ventricle: The cavity size was severely dilated.  Systolic   function was severely reduced. - Right atrium: The atrium was moderately dilated. - Pericardium, extracardiac: A small pericardial effusion was   identified along the right atrial free wall. There was no   evidence of hemodynamic compromise  Assessment   1. Active Problems: 2.   Acute systolic heart failure ([place]) 3.   Atrial fibrillation with RVR ([place]) 4.   Acute on chronic combined systolic and diastolic congestive heart failure ([place]) 5.   Plan   1. Logan [name] presents with acute decompensated systolic congestive heart failure.  Echo shows newly reduced LVEF to 10 to 15% with biventricular failure.  There is high suspicion for possible metastatic cancer including a high PSA.  He has previously declined additional work-up however is now more interested in his health care.  He has poorly controlled A. fib with RVR.  I recommend stopping diltiazem and will switch him to amiodarone.  Will need to increase diuresis to 60 mg IV 3 times daily.Marland Kitchen  He does report improved breathing after thoracentesis.  Okay to go ahead and obtain CT scan of the abdomen with contrast per Dr. Agustina Caroli recommendations.  Discussed with advanced heart failure service - they agree with these management recommendations and do not see a role at this time given potentially a poor prognosis if there is metastatic cancer. May ultimately need palliative care evaluation.  Time Spent Directly with Patient:  I have spent a total of 35 minutes with the patient reviewing hospital notes, telemetry, EKGs, labs and examining the patient as well as establishing an assessment and plan that was discussed personally with the patient.  > 50% of time was spent in direct patient care.  Length of Stay:  LOS: 1 day  Pixie Casino, MD, [facility], [place] Director of the Advanced Lipid Disorders &  Cardiovascular Risk Reduction Clinic Diplomate of the American Board of Clinical  Lipidology Attending Cardiologist  Direct Dial: [phone]  Fax: [phone]  Website:  [email or web address] 09/27/2017, 1:15 PM

## 2017-09-27 NOTE — Progress Notes (Signed)
Pt not in room when RT went to check to see if they wanted to wear CPAP for the night. Wife stated they have pt's home unit and can place it on him when ready for bed. RT will continue to monitor as needed.

## 2017-09-27 NOTE — Progress Notes (Signed)
PCCM Interval Note  Appreciate IR assistance with thoracentesis - dark yellow fluid 700cc. Cytology sent and pending.  Will follow results with you.   Would still recommend CT abdomen with and without contrast whenever it is OK to give contrast (based on the diuretics).   Baltazar Apo, MD, PhD 09/27/2017, 12:12 PM [facility] [phone] or if no answer [phone]

## 2017-09-27 NOTE — Procedures (Signed)
PROCEDURE SUMMARY:  Successful image-guided right thoracentesis. Yielded 700 milliliters of dark yellow fluid. Patient tolerated procedure well. No immediate complications.  Specimen was sent for labs. CXR ordered.  Joaquim Nam PA-C 09/27/2017 11:23 AM

## 2017-09-27 NOTE — Progress Notes (Signed)
ANTICOAGULATION CONSULT NOTE  Pharmacy Consult for Heparin Indication: atrial fibrillation  No Known Allergies  Patient Measurements: Height: 5\' 10"  (177.8 cm) Weight: 245 lb 9.5 oz (111.4 kg) IBW/kg (Calculated) : 73 Heparin Dosing Weight: 97kg  Vital Signs: Temp: 98.6 F (37 C) (07/16 2033) Temp Source: Oral (07/16 2033) BP: 91/66 (07/16 2033) Pulse Rate: 119 (07/16 2033)  Labs: Recent Labs    09/26/17 0955 09/26/17 1102 09/26/17 1559 09/26/17 2131 09/27/17 0242  HGB 11.4*  --   --   --  12.0*  HCT 34.0*  --   --   --  37.6*  PLT 336  --   --   --  256  APTT  --  31 71*  --   --   LABPROT  --  16.3* 16.5*  --   --   INR  --  1.32 1.35  --   --   HEPARINUNFRC  --   --  0.20*  --  0.26*  CREATININE  --  1.22 1.10  --  0.95  TROPONINI  --  0.04* 0.03* <0.03  --     Estimated Creatinine Clearance: 94.3 mL/min (by C-G formula based on SCr of 0.95 mg/dL).   Medical History: Past Medical History:  Diagnosis Date  . CHF (congestive heart failure) ([place])   . Chronic systolic heart failure ([place]) 04/15/2015    EF 30-35% by echo 2016 , [facility]   . Hypertension   . Lung mass 04/15/2015    Never biopsied. History of PET scan that raised concern.   . Metabolic syndrome 7/0/6237  . OSA on CPAP   . Paroxysmal atrial fibrillation ([place]) 04/15/2015   On apixaban   . Prostate cancer ([place])   . Thrombocytopenia ([place]) 04/15/2015    Medications:  Infusions:  . sodium chloride    . diltiazem (CARDIZEM) infusion 7.5 mg/hr (09/26/17 2126)  . heparin 1,650 Units/hr (09/27/17 0019)    Assessment: 51 yom presented to the ED with afib with RVR from cardiologist office. To start IV heparin for anticoagulation. He has been on xarelto in the past but this was stopped. Hgb stable.   Heparin level sub-therapeutic this AM  Goal of Therapy:  Heparin level 0.3-0.7 units/ml Monitor platelets by anticoagulation protocol: Yes   Plan:  Increase IV Heparin to 1850 units/hr Check heparin  level in 8 hrs Daily heparin level and CBC  Narda Bonds, PharmD, BCPS Clinical Pharmacist Phone: [phone]

## 2017-09-28 DIAGNOSIS — Z87891 Personal history of nicotine dependence: Secondary | ICD-10-CM

## 2017-09-28 DIAGNOSIS — K769 Liver disease, unspecified: Secondary | ICD-10-CM

## 2017-09-28 DIAGNOSIS — R918 Other nonspecific abnormal finding of lung field: Secondary | ICD-10-CM

## 2017-09-28 DIAGNOSIS — R160 Hepatomegaly, not elsewhere classified: Secondary | ICD-10-CM

## 2017-09-28 DIAGNOSIS — C61 Malignant neoplasm of prostate: Secondary | ICD-10-CM

## 2017-09-28 LAB — CBC
HEMATOCRIT: 33.6 % — AB (ref 39.0–52.0)
HEMOGLOBIN: 11 g/dL — AB (ref 13.0–17.0)
MCH: 29.7 pg (ref 26.0–34.0)
MCHC: 32.7 g/dL (ref 30.0–36.0)
MCV: 90.8 fL (ref 78.0–100.0)
Platelets: 250 10*3/uL (ref 150–400)
RBC: 3.7 MIL/uL — ABNORMAL LOW (ref 4.22–5.81)
RDW: 13.8 % (ref 11.5–15.5)
WBC: 7.7 10*3/uL (ref 4.0–10.5)

## 2017-09-28 LAB — HEPARIN LEVEL (UNFRACTIONATED): HEPARIN UNFRACTIONATED: 0.52 [IU]/mL (ref 0.30–0.70)

## 2017-09-28 MED ORDER — CARVEDILOL 3.125 MG PO TABS
3.1250 mg | ORAL_TABLET | Freq: Two times a day (BID) | ORAL | Status: DC
  Administered 2017-09-28 – 2017-10-03 (×10): 3.125 mg via ORAL
  Filled 2017-09-28 (×12): qty 1

## 2017-09-28 NOTE — Progress Notes (Signed)
Pt has his home unit NIV machine. States that he will placed self on once he is ready for bed.

## 2017-09-28 NOTE — Progress Notes (Signed)
Patient ID: Logan [name], male   DOB: [birth year], 67 y.o.   MRN: [id number]   IR aware of liver lesion biopsy Will see pt in am See orders for npo and DC Hep

## 2017-09-28 NOTE — Consult Note (Signed)
Logan [name]  [phone]   [place]  DOB: March 28, 1950  MR#: [id number]  CSN#: [id number]    Requesting Physician: [facility]  Attending Physician: Dr. Daneen Schick  Patient Care Team: [name], MD as PCP - General (Family Medicine)  Reason for consult: Liver and lung masses  History of present illness:   09/28/17 Logan [name] 67 y.o. male presented to the ED for worsening SOB due to CHF. Workup shows multiple masses concerning for cancer. He presents with his wife and 2 family friends.   He had prostate cancer in 2012 found by biopsy. He was under observation as he declined treatment at the time. He notes his lung mass was found in 2015 which was incidentally discovered while in hospital for CHF and has increased in size along with chest lymph nodes. He has declined biopsies, and scans for these in the past.   He notes other than heart failure he feels fine. His wife notes him coughing up blood for the past 6 months. He notes this occurs when he is on Xarelto as he has Afib. He stopped Xarelto and went back to baby aspirin and blood decreased. Now he sees no blood. Has leg swelling which makes it harder for him to walk and he has abdominal bloating as well with no pain. He notes his appetite has been down for the past few weeks due to bloating. He increased diuretics with overall no weight loss.   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 His [facility]ologist advised him to go to [facility] ED.   He has HTN which is controlled. He had a minor heart attack several years ago which may contribute to his current CHF. He use to smoke cigarettes for 8 years in the 1970s 1ppd. He grew up in the house with a heavy smoker. He now only smokes CBD. He drinks socially.      REVIEW OF SYSTEMS:   Constitutional: Denies fevers, chills or  abnormal night sweats (+) low appetite Eyes: Denies blurriness of vision, double vision or watery eyes Ears, nose, mouth, throat, and face: Denies mucositis or sore throat Respiratory: Denies cough, dyspnea or wheezes (+) SOB Cardiovascular: Denies palpitation, chest discomfort (+) lower extremity swelling Gastrointestinal:  Denies nausea, heartburn or change in bowel habits (+) abdominal bloating Skin: Denies abnormal skin rashes Lymphatics: Denies new lymphadenopathy or easy bruising Neurological:Denies numbness, tingling or new weaknesses Behavioral/Psych: Mood is stable, no new changes  All other systems were reviewed with the patient and are negative.  MEDICAL HISTORY:  Past Medical History:  Diagnosis Date  . CHF (congestive heart failure) ([place])   . Chronic systolic heart failure ([place]) 04/15/2015    EF 30-35% by echo 2016 , [facility]   . Hypertension   . Lung mass 04/15/2015    Never biopsied. History of PET scan that raised concern.   . Metabolic syndrome 7/0/2637  . OSA on CPAP   . Paroxysmal atrial fibrillation ([place]) 04/15/2015   On apixaban   . Prostate cancer ([place])   . Thrombocytopenia ([place]) 04/15/2015    SURGICAL HISTORY: Past Surgical History:  Procedure Laterality Date  . CARDIAC CATHETERIZATION N/A 04/27/2015   Procedure: Right/Left Heart Cath and Coronary Angiography;  Surgeon: [name], MD;  Location: [place] [facility];  Service: Cardiovascular;  Laterality: N/A;  . IR THORACENTESIS ASP PLEURAL SPACE  W/IMG GUIDE  09/27/2017  . PROSTATE BIOPSY      SOCIAL HISTORY: Social History   Socioeconomic History  . Marital status: Married    Spouse name: Not on file  . Number of children: Not on file  . Years of education: Not on file  . Highest education level: Not on file  Occupational History  . Not on file  Social Needs  . Financial resource strain: Not on file  . Food insecurity:    Worry: Not on file    Inability: Not on file  . Transportation needs:     Medical: Not on file    Non-medical: Not on file  Tobacco Use  . Smoking status: Former Smoker    Years: 6.50    Types: Cigarettes, Cigars, Pipe    Last attempt to quit: 03/14/1974    Years since quitting: 43.5  . Smokeless tobacco: Never Used  Substance and Sexual Activity  . Alcohol use: Yes    Alcohol/week: 0.0 oz    Comment: 09/26/2017 "holidays only"  . Drug use: Never  . Sexual activity: Yes  Lifestyle  . Physical activity:    Days per week: Not on file    Minutes per session: Not on file  . Stress: Not on file  Relationships  . Social connections:    Talks on phone: Not on file    Gets together: Not on file    Attends religious service: Not on file    Active member of club or organization: Not on file    Attends meetings of clubs or organizations: Not on file    Relationship status: Not on file  . Intimate partner violence:    Fear of current or ex partner: Not on file    Emotionally abused: Not on file    Physically abused: Not on file    Forced sexual activity: Not on file  Other Topics Concern  . Not on file  Social History Narrative  . Not on file    FAMILY HISTORY: Family History  Problem Relation Age of Onset  . Parkinson's disease Mother   . Hypertension Father     ALLERGIES:  has No Known Allergies.  MEDICATIONS:  Current Facility-Administered Medications  Medication Dose Route Frequency Provider Last Rate Last Dose  . 0.9 %  sodium chloride infusion  250 mL Intravenous PRN Burtis Junes, NP      . acetaminophen (TYLENOL) tablet 650 mg  650 mg Oral Q4H PRN Burtis Junes, NP      . amiodarone (NEXTERONE PREMIX) 360-4.14 MG/200ML-% (1.8 mg/mL) IV infusion  30 mg/hr Intravenous Continuous Pixie Casino, MD 16.7 mL/hr at 09/27/17 2259 30 mg/hr at 09/27/17 2259  . aspirin EC tablet 81 mg  81 mg Oral Daily Truitt Merle C, NP   81 mg at 09/28/17 0951  . carvedilol (COREG) tablet 3.125 mg  3.125 mg Oral BID WC Hilty, Nadean Corwin, MD   3.125 mg at  09/28/17 1741  . furosemide (LASIX) injection 60 mg  60 mg Intravenous TID Pixie Casino, MD   60 mg at 09/28/17 1741  . heparin ADULT infusion 100 units/mL (25000 units/270mL sodium chloride 0.45%)  1,850 Units/hr Intravenous Continuous Erenest Blank, RPH 18.5 mL/hr at 09/28/17 0533 1,850 Units/hr at 09/28/17 0533  . lidocaine (XYLOCAINE) 2 % injection   Infiltration PRN Jacqulynn Cadet, MD   10 mL at 09/27/17 0930  . ondansetron (ZOFRAN) injection 4 mg  4 mg Intravenous Q6H PRN  Burtis Junes, NP      . sacubitril-valsartan (ENTRESTO) 24-26 mg per tablet  1 tablet Oral BID Burtis Junes, NP   1 tablet at 09/28/17 0950  . sodium chloride flush (NS) 0.9 % injection 3 mL  3 mL Intravenous Q12H Truitt Merle C, NP   3 mL at 09/28/17 0951  . sodium chloride flush (NS) 0.9 % injection 3 mL  3 mL Intravenous PRN Burtis Junes, NP        PHYSICAL EXAMINATION: ECOG PERFORMANCE STATUS: 2 - Symptomatic, <50% confined to bed  Vitals:   09/28/17 0553 09/28/17 1311  BP: (!) 86/76 93/75  Pulse: (!) 107 (!) 103  Resp: (!) 21 20  Temp: 97.9 F (36.6 C) 98.4 F (36.9 C)  SpO2: 95% 95%   Filed Weights   09/26/17 1601 09/27/17 0542 09/28/17 0553  Weight: 245 lb 9.5 oz (111.4 kg) 240 lb 15.4 oz (109.3 kg) 251 lb 12.3 oz (114.2 kg)   GENERAL:alert, no distress and comfortable SKIN: skin color, texture, turgor are normal, no rashes or significant lesions EYES: normal, conjunctiva are pink and non-injected, sclera clear OROPHARYNX:no exudate, no erythema and lips, buccal mucosa, and tongue normal  NECK: supple, thyroid normal size, non-tender, without nodularity LYMPH:  no palpable lymphadenopathy in the cervical, axillary or inguinal LUNGS: clear to auscultation and percussion with normal breathing effort HEART: regular rate & rhythm and no murmurs and no lower extremity edema ABDOMEN:abdomen soft, non-tender and normal bowel sounds Musculoskeletal:no cyanosis of digits and no  clubbing  PSYCH: alert & oriented x 3 with fluent speech NEURO: no focal motor/sensory deficits  LABORATORY DATA:  I have reviewed the data as listed Lab Results  Component Value Date   WBC 7.7 09/28/2017   HGB 11.0 (L) 09/28/2017   HCT 33.6 (L) 09/28/2017   MCV 90.8 09/28/2017   PLT 250 09/28/2017   Recent Labs    02/07/17 0904 09/26/17 1102 09/26/17 1559 09/27/17 0242  NA 138 138 138 137  K 3.6 4.8 4.3 4.7  CL 103 98 98 100  CO2 24 32 30 27  GLUCOSE 108* 115* 117* 109*  BUN 16 17 16 17   CREATININE 1.02 1.22 1.10 0.95  CALCIUM 9.2 8.8* 8.5* 8.3*  GFRNONAA 76 60* >60 >60  GFRAA 88 >60 >60 >60  PROT 7.1 7.3 7.2  --   ALBUMIN 4.5 2.6* 2.7*  --   AST 27 35 32  --   ALT 33 45* 46*  --   ALKPHOS 63 94 89  --   BILITOT 0.5 0.8 0.8  --   BILIDIR 0.13  --   --   --     RADIOGRAPHIC STUDIES: I have personally reviewed the radiological images as listed and agreed with the findings in the report. Ct Abdomen Pelvis W Wo Contrast  Result Date: 09/27/2017 CLINICAL DATA:  Prostate cancer.  Lung mass.  Staging assessment EXAM: CT ABDOMEN AND PELVIS WITHOUT AND WITH CONTRAST TECHNIQUE: Multidetector CT imaging of the abdomen and pelvis was performed following the standard protocol before and following the bolus administration of intravenous contrast. CONTRAST:  158mL OMNIPAQUE IOHEXOL 300 MG/ML  SOLN COMPARISON:  CT abdomen report from 03/26/2012. CT chest from 09/26/2017 FINDINGS: Lower chest: Approximately 10.2 by 9.0 cm right lower lobe mass with some central calcifications and internal heterogeneity, and surrounding consolidation in the right lower lobe as shown on recent chest CT. Trace bilateral pleural effusions. Suspected right lower paraesophageal adenopathy is only partially  included on today's exam on image 1/8. There is a moderate degree of atelectasis in the right middle lobe mild atelectasis in the left lower lobe. Moderate cardiomegaly. Mild prominence of inferior pericardial  fluid. Hepatobiliary: Heterogeneous 6.1 by 5.2 cm right hepatic lobe mass on image 35/8 has a targetoid appearance. Rim enhancing 2.6 by 2.2 cm lesion in segment 4b of the liver on image 42/8 likewise favoring malignancy. Equivocal accentuated density in segment 4a of the liver on image 22/8 measuring 1.1 cm in diameter, possibly a third lesion. Pancreas: Unremarkable Spleen: Nonspecific hypodense 2.0 by 1.6 cm lesion anteriorly in the spleen on image 22/8. Adrenals/Urinary Tract: The adrenal glands appear normal. Indistinct somewhat triangular 1.6 by 1.2 cm hypodensity in the right mid kidney laterally on image 22/13 is nonspecific. 0.8 cm hypodensity in the left kidney upper pole parenchyma on image 11/13, nonspecific. Indistinct 1.2 by 1.1 cm hypodensity in the left mid kidney anteriorly on image 20/13. These do not have the typical sharply defined appearance of cysts. Urinary bladder unremarkable. No urinary tract calculi are identified. Stomach/Bowel: Unremarkable Vascular/Lymphatic: Minimal atherosclerotic calcification the left common iliac artery. No pathologic adenopathy. Reproductive: The prostate gland measures 4.9 by 4.6 by 6.1 cm (volume = 72 cm^3). Other: Small amount of localized perihepatic ascites adjacent to the right hepatic lobe and near the right hepatic lobe lesion. Small amount of upper pelvic ascites and presacral edema. Subcutaneous edema along the flanks and lower anterior abdominal wall. Musculoskeletal: Degenerative facet arthropathy at L4-5 and L5-S1 with grade 1 degenerative anterolisthesis at L4-5. IMPRESSION: 1. Large complex masses in the right lower lobe of the lung and in the right hepatic lobe. Smaller liver lesions in the left hepatic lobe. Nonspecific lesions in the spleen and kidneys. No significant pathologic adenopathy. This would not be a characteristic metastatic pattern for prostate cancer and is not a specific metastatic and imaging pattern to suggest an individual  etiology. Possibilities might include metastatic lung cancer, melanoma, metastatic hepatocellular carcinoma, or a variety of other possibilities. Tissue diagnosis recommended. 2. Moderate prostatomegaly. 3. Scattered mild ascites. 4. Moderate cardiomegaly.  Small inferior pericardial effusion. 5. Trace bilateral pleural effusions. Electronically Signed   By: Van Clines M.D.   On: 09/27/2017 23:00   Dg Chest 1 View  Result Date: 09/27/2017 CLINICAL DATA:  Status post right thoracentesis today. EXAM: CHEST  1 VIEW COMPARISON:  Single-view of the chest earlier today. CT chest 09/26/2017. FINDINGS: Right pleural effusion is decreased after thoracentesis. No pneumothorax. No other change. IMPRESSION: Decreased right pleural effusion after thoracentesis. Negative for pneumothorax. Persistent right basilar opacity consistent with residual pleural fluid and pulmonary mass. Electronically Signed   By: Inge Rise M.D.   On: 09/27/2017 12:03   Dg Chest 2 View  Result Date: 09/27/2017 CLINICAL DATA:  Worsening shortness of breath and bilateral lower extremity swelling over the past several days. EXAM: CHEST - 2 VIEW COMPARISON:  CT chest 09/26/2017. FINDINGS: Right pleural effusion and dense opacity in the right lower chest consistent with mass seen on the prior CT is unchanged. A very small left pleural effusion and mild basilar atelectasis noted. Heart size is enlarged. No pneumothorax. No acute bony abnormality. IMPRESSION: No change in a right pleural effusion and right lower lobe opacity consistent with the mass lesion seen on prior CT. Cardiomegaly without edema. Tiny left pleural effusion and mild basilar atelectasis. Electronically Signed   By: Inge Rise M.D.   On: 09/27/2017 10:58   Ct Chest Wo Contrast  Result Date: 09/26/2017 CLINICAL DATA:  Dyspnea, peripheral edema, symptoms worsening over last week, lung nodule, lung mass EXAM: CT CHEST WITHOUT CONTRAST TECHNIQUE: Multidetector CT  imaging of the chest was performed following the standard protocol without IV contrast. COMPARISON:  None FINDINGS: Cardiovascular: No significant vascular findings. Normal heart size. No pericardial effusion. Mediastinum/Nodes: Prominent mediastinal lymph nodes are mildly enlarged. For example RIGHT lower paratracheal lymph node measuring 13 mm short axis. RIGHT hilar lymph node measuring 12 mm short axis. Lungs/Pleura: Consolidative mass in the RIGHT lower lobe with central and peripheral calcifications is poorly marginated. Mass measures approximately 8.5 by 10 by 9.0 cm and occupies the majority of the RIGHT lower lobe. There is there is airspace disease in the medial RIGHT lower lobe with air bronchograms. Small effusion. Within the RIGHT upper lobe 6 mm peripheral nodule image 62/4. Small LEFT effusion. Upper Abdomen: Limited view of the liver, kidneys, pancreas are unremarkable. Normal adrenal glands. Musculoskeletal: Bulky osteophytes of the spine. No aggressive osseous lesion. IMPRESSION: 1. Large RIGHT lower lobe mass with central and peripheral calcifications occupies the near entirety of the RIGHT lower lobe and concerning for neoplasm. 2. Moderate RIGHT effusion. 3. Mild mediastinal and RIGHT hilar adenopathy. 4. Recommend FDG PET scan for biopsy planning/staging These results will be called to the ordering clinician or representative by the Radiologist Assistant, and communication documented in the PACS or zVision Dashboard. Electronically Signed   By: Suzy Bouchard M.D.   On: 09/26/2017 10:43   Ir Thoracentesis Asp Pleural Space W/img Guide  Result Date: 09/27/2017 INDICATION: Symptomatic right pleural effusion - request for diagnostic thoracentesis EXAM: ULTRASOUND GUIDED RIGHT THORACENTESIS MEDICATIONS: 10 mL 2 % Lidocaine. COMPLICATIONS: None immediate. PROCEDURE: An ultrasound guided thoracentesis was thoroughly discussed with the patient and questions answered. The benefits, risks,  alternatives and complications were also discussed. The patient understands and wishes to proceed with the procedure. Written consent was obtained. Ultrasound was performed to localize and mark an adequate pocket of fluid in the right chest. The area was then prepped and draped in the normal sterile fashion. 2% Lidocaine was used for local anesthesia. Under ultrasound guidance a 6 Fr Safe-T-Centesis catheter was introduced. Thoracentesis was performed. The catheter was removed and a dressing applied. FINDINGS: A total of approximately 700 mL of golden yellow fluid was removed. Samples were sent to the laboratory as requested by the clinical team. IMPRESSION: Successful ultrasound guided right thoracentesis yielding 700 mL of pleural fluid. Read by Candiss Norse, PA-C Electronically Signed   By: Jacqulynn Cadet M.D.   On: 09/27/2017 11:27    ASSESSMENT & PLAN:  Logan [name] is a 67 y.o. male who presented to the ED on 09/26/17 for worsening SOB and leg swelling. He has a h/o a lung mass since 2015 that has not been evaluated and H/o prostate cancer on observation.    1. Liver mass concerning for metastatic cancer 2. RLL lung mass with hilar and mediastinal adenopathy, suspicious for primary lung cancer 3.  History of untreated prostate cancer, with rising PSA 4.  Bilateral pleural effusion,R>L 5.  Atrial fibrillation, cardiomyopathy with EF 10 to 15%  I have reviewed his chart, labs and CT scan images myself and discussed with patient and his wife.  He apparently had untreated prostate cancer in 2012, RLL lung mass was initially found in 2015, was seen by oncologist at [facility] in 2017 but declined further biopsy and treatment.  His liver was negative at that time.  His  history and imaging findings are highly suspicious for metastatic right lower lobe lung cancer, however the indolent course is not typical for lung cancer.  Also metastatic breast cancer is still quite possible, but the  disease distribution, especially with predominant right lower lobe lung mass, is uncommon for metastatic prostate cancer.  Regardless, will need a tissue diagnosis, to guide treatment.  Patient is agreeable with biopsy now.  I have called pathology, his cytology from thoracentesis a few days ago was negative for malignant cells.   Recommendations: -Proceed with IR biopsy tomorrow if possible, likely liver biopsy first -if liver biopsy confirms metastatic prostate cancer, I would still favor to biopsy of his lung lesion, to rule out primary lung cancer  -if it's primary lung cancer, he would need brain MRI.  -I will f/u after his biopsy returns    All questions were answered. The patient knows to call the clinic with any problems, questions or concerns.    Truitt Merle, MD 09/28/2017 6:17 PM  I, Joslyn Devon, am acting as scribe for Truitt Merle, MD.   I have reviewed the above documentation for accuracy and completeness, and I agree with the above.

## 2017-09-28 NOTE — Progress Notes (Addendum)
Name: Logan [name] MRN: [id number] DOB: 28-Dec-1950    ADMISSION DATE:  09/26/2017 CONSULTATION DATE:  09/26/2017  REFERRING MD :  Dr. Tamala Julian  CHIEF COMPLAINT:  SOB/ leg swelling  HISTORY OF PRESENT ILLNESS:   67 year old male with PMH significant for NICM- EF 25-30%, PAF on low-dose aspirin, and OSA who presented for ongoing SOB and leg swelling.  Additionally he states quit smoking cigarettes in 1796 (smoked for ~8 years), then smoked cigars- quit 2 years, currently vaps CBD oil.  He reports compliance with CPAP at night and cleans often.  He works currently as a Management consultant.  He denies any prior known exposures of bird, mold, chemicals, or hot tubs.  Has no pets.  He has known history of a hypermetabolic right lower lung mass and left adrenal nodule noted on PET 03/2015 and elevated PSA (28.9 on 03/25/2015) however, has previously denied further workup for such.  Wife states that they have just been in denial but are now ready for further workup.    He denies recent weight loss, if any it has been weight gain.  Reports chronic hemoptysis for last 2.5 years.  Stated when he was taking Xarelto, he would cough up frank blood, therefore quit taking after 3 months.  He can not recall when he quit taking Xarelto.  He most recent hemoptysis was around 2.5 weeks ago and at that time would be streaked with bright red blood, mostly worse in the morning.  He started taking herbal supplement, Emphaplex, and since has no further hemoptysis.  Patient reports recent travel to [place] 7/15-25, to visit his children.  During visit, had URI but stated everyone else was sick as well.  Returned home and noticed progressive lower leg swelling.  States he has not been compliant with his sodium restriction.  Was seen at [facility] in ER on 7/12 for this and refused admission at that time and diuretics increased.  He was seen at cardiologist office this morning with progressive dyspnea and swelling and found to be  in Afib with RVR thus sent for admission to [facility].  He has been placed on Cardizem gtt for rate control and heparin gtt.   PCCM asked to consult for further evaluation of lung mass and ongoing hemoptysis.   SUBJECTIVE:   VITAL SIGNS: Temp:  [97.9 F (36.6 C)-98.4 F (36.9 C)] 98.4 F (36.9 C) (07/18 1311) Pulse Rate:  [103-111] 103 (07/18 1311) Resp:  [20-31] 20 (07/18 1311) BP: (86-93)/(71-76) 93/75 (07/18 1311) SpO2:  [92 %-96 %] 95 % (07/18 1311) Weight:  [114.2 kg (251 lb 12.3 oz)] 114.2 kg (251 lb 12.3 oz) (07/18 0553)  PHYSICAL EXAMINATION: General:  Well nourished adult male sitting upright in bed in NAD HEENT: MM pink/moist, Neuro: Alert, oriented, non-focal CV: IRIR, afib rate 110, distant heart sounds PULM: even/non-labored, lungs bilaterally clear anteriorly, bibasilar rales, diminished in right base GI: obese, soft, non-tender, bs active  Extremities: warm/dry, +2 BLE edema  Skin: no rashes   Recent Labs  Lab 09/26/17 1102 09/26/17 1559 09/27/17 0242  NA 138 138 137  K 4.8 4.3 4.7  CL 98 98 100  CO2 32 30 27  BUN 17 16 17   CREATININE 1.22 1.10 0.95  GLUCOSE 115* 117* 109*   Recent Labs  Lab 09/26/17 0955 09/27/17 0242 09/28/17 0217  HGB 11.4* 12.0* 11.0*  HCT 34.0* 37.6* 33.6*  WBC 8.4 7.3 7.7  PLT 336 256 250     SIGNIFICANT EVENTS  7/16  Admit to cardiology   STUDIES:  CTA PE 03/2014 >>  neg for PE; A large right infrahilar soft tissue mass is identified measuring 7.7 x 6.1 cm in transaxial dimensions by 5.8 cm in craniocaudal height. This demonstrates heterogeneous enhancement. Mild diffuse groundglass densities are identified throughout the lungs;  mildly enlarged precarinal lymph node measuring 1.4 cm in short axis.  PET 04/07/2015  >> 1.  Hypermetabolic right lower lobe mass max SUV 13.2 measuring 6.2 x 7.9 cm (image      128). The mass previously measured 6.1 x 7.7 cm 2.  12 mm hypermetabolic left adrenal nodule (image 155). 3.  No other  worrisome hypermetabolic activity is identified. 4.  No hypermetabolic 4 mm subpleural right upper lobe nodule (image 24) 2 small            accurately characterize by PET.  09/26/2017 CT chest without contrast >>  1. Large RIGHT lower lobe mass with central and peripheral calcifications occupies the near entirety of the RIGHT lower lobe and concerning for neoplasm. 2. Moderate RIGHT effusion. 3. Mild mediastinal and RIGHT hilar adenopathy. 4. Recommend FDG PET scan for biopsy planning/staging  7/17 CT abd and pelvis >>  1. Large complex masses in the right lower lobe of the lung and in the right hepatic lobe. Smaller liver lesions in the left hepatic lobe. Nonspecific lesions in the spleen and kidneys. No significant pathologic adenopathy. This would not be a characteristic metastatic pattern for prostate cancer and is not a specific metastatic and imaging pattern to suggest an individual etiology. Possibilities might include metastatic lung cancer, melanoma, metastatic hepatocellular carcinoma, or a variety of other possibilities. Tissue diagnosis recommended. 2. Moderate prostatomegaly. 3. Scattered mild ascites. 4. Moderate cardiomegaly.  Small inferior pericardial effusion. 5. Trace bilateral pleural effusions   ASSESSMENT / PLAN:  Right Lower lobe mass Hemoptysis Pleural effusion, right moderate, left mild OSA Newly discovered hepatic mass on CT abdomen 7/17.  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  It would be very unusual for prostate cancer to go to the lung and the liver in this fashion and his prior imaging dating back several years shows that the lung lesion has preceded the other abnormalities.  Difficult to incorporate his significant elevation in PSA, question whether he may have 2 separate malignancies.   Recommend: -Await pleural fluid  cytology results, suspect this will be available 7/19. -He will need a tissue diagnosis on the hepatic mass and possibly also the lung mass depending on the results of the pleural fluid cytology.  I would approach the liver mass first as I am most suspicious that this is metastatic lung cancer and that biopsy would potentially give a unifying diagnosis. -If there is still question about the etiology of the right lower lobe mass after pleural fluid analysis and a liver biopsy then he may need either bronchoscopy or transthoracic needle biopsy of his lung to define the lung mass itself. -Timing of a liver biopsy will depend on when it safe to stop his heparin.  He is possibly going to have a DCCV on 7/19.  I discussed these plans with the patient at bedside.  He understands.  We will plan next steps in the diagnostic work-up when his pleural fluid is resulted and when it is safe for him to have a break from heparin.   Baltazar Apo, MD, PhD 09/28/2017, 4:36 PM  [facility] [phone] or if no answer [phone]

## 2017-09-28 NOTE — Progress Notes (Addendum)
DAILY PROGRESS NOTE   Patient Name: Logan [name] Date of Encounter: 09/28/2017  Chief Complaint   Dyspnea improved  Patient Profile   67 yo male with history of nonischemic cardiomyopathy, EF 25 to 30%, PAF and known lung mass, presents with acute decompensated systolic congestive heart failure and pleural effusion, admitted for diuresis and thoracentesis.  Subjective   Switched to amiodarone yesterday for better rate control. BP soft overnight. Diuresis increased, now 1.5L negative yesterday, however, weight recorded up? Suspect error. CT scan yesterday, per Dr. Lamonte Sakai, mentions significant mass in the right lower lobe of the lung and right hepatic lobe - states that is is "not characteristic metastatic pattern for prostate cancer". Possible metastatic hepatocellular carcinoma, lung CA, or multiple tumors. + ascites, small pleural and pericardial effusions.   Objective   Vitals:   09/27/17 1332 09/27/17 2137 09/28/17 0149 09/28/17 0553  BP: 103/65 (!) 88/71  (!) 86/76  Pulse: 97 (!) 111  (!) 107  Resp: 18 (!) 31 (!) 24 (!) 21  Temp: 98.8 F (37.1 C) 97.9 F (36.6 C)  97.9 F (36.6 C)  TempSrc: Oral Oral  Oral  SpO2: 94% 96% 92% 95%  Weight:    251 lb 12.3 oz (114.2 kg)  Height:        Intake/Output Summary (Last 24 hours) at 09/28/2017 1144 Last data filed at 09/28/2017 0259 Gross per 24 hour  Intake 794.46 ml  Output 1250 ml  Net -455.54 ml   Filed Weights   09/26/17 1601 09/27/17 0542 09/28/17 0553  Weight: 245 lb 9.5 oz (111.4 kg) 240 lb 15.4 oz (109.3 kg) 251 lb 12.3 oz (114.2 kg)    Physical Exam   General appearance: alert and no distress Neck: JVD - 3 cm above sternal notch, no carotid bruit, supple, symmetrical, trachea midline and thyroid not enlarged, symmetric, no tenderness/mass/nodules Lungs: diminished breath sounds RLL Heart: irregularly irregular rhythm and tachycardic Abdomen: soft, non-tender; bowel sounds normal; no masses,  no  organomegaly and mildly protuberant Extremities: edema trade LE pitting edema bilterally Pulses: 2+ and symmetric Skin: Skin color, texture, turgor normal. No rashes or lesions Neurologic: Grossly normal Psych: Pleasant  Inpatient Medications    Scheduled Meds: . aspirin EC  81 mg Oral Daily  . carvedilol  6.25 mg Oral BID WC  . furosemide  60 mg Intravenous TID  . sacubitril-valsartan  1 tablet Oral BID  . sodium chloride flush  3 mL Intravenous Q12H    Continuous Infusions: . sodium chloride    . amiodarone 30 mg/hr (09/27/17 2259)  . heparin 1,850 Units/hr (09/28/17 0533)    PRN Meds: sodium chloride, acetaminophen, lidocaine, ondansetron (ZOFRAN) IV, sodium chloride flush   Labs   Results for orders placed or performed during the hospital encounter of 09/26/17 (from the past 48 hour(s))  Heparin level (unfractionated)     Status: Abnormal   Collection Time: 09/26/17  3:59 PM  Result Value Ref Range   Heparin Unfractionated 0.20 (L) 0.30 - 0.70 IU/mL    Comment: (NOTE) If heparin results are below expected values, and patient dosage has  been confirmed, suggest follow up testing of antithrombin III levels. Performed at [facility], [place] [address], [place], [place] [address]   APTT     Status: Abnormal   Collection Time: 09/26/17  3:59 PM  Result Value Ref Range   aPTT 71 (H) 24 - 36 seconds    Comment:  IF BASELINE aPTT IS ELEVATED, SUGGEST PATIENT RISK ASSESSMENT BE USED TO DETERMINE APPROPRIATE ANTICOAGULANT THERAPY. Performed at [facility], [place] [address] [place], [place] [address]   Magnesium     Status: None   Collection Time: 09/26/17  3:59 PM  Result Value Ref Range   Magnesium 2.1 1.7 - 2.4 mg/dL    Comment: Performed at [facility] [address], [place], [place] [address]  Comprehensive metabolic panel     Status: Abnormal   Collection Time: 09/26/17  3:59 PM  Result Value Ref Range   Sodium 138 135 - 145 mmol/L    Potassium 4.3 3.5 - 5.1 mmol/L   Chloride 98 98 - 111 mmol/L    Comment: Please note change in reference range.   CO2 30 22 - 32 mmol/L   Glucose, Bld 117 (H) 70 - 99 mg/dL    Comment: Please note change in reference range.   BUN 16 8 - 23 mg/dL    Comment: Please note change in reference range.   Creatinine, Ser 1.10 0.61 - 1.24 mg/dL   Calcium 8.5 (L) 8.9 - 10.3 mg/dL   Total Protein 7.2 6.5 - 8.1 g/dL   Albumin 2.7 (L) 3.5 - 5.0 g/dL   AST 32 15 - 41 U/L   ALT 46 (H) 0 - 44 U/L    Comment: Please note change in reference range.   Alkaline Phosphatase 89 38 - 126 U/L   Total Bilirubin 0.8 0.3 - 1.2 mg/dL   GFR calc non Af Amer >60 >60 mL/min   GFR calc Af Amer >60 >60 mL/min    Comment: (NOTE) The eGFR has been calculated using the CKD EPI equation. This calculation has not been validated in all clinical situations. eGFR's persistently <60 mL/min signify possible Chronic Kidney Disease.    Anion gap 10 5 - 15    Comment: Performed at [facility] [address] [place], [place] [address]  Troponin I     Status: Abnormal   Collection Time: 09/26/17  3:59 PM  Result Value Ref Range   Troponin I 0.03 (HH) <0.03 ng/mL    Comment: CRITICAL VALUE NOTED.  VALUE IS CONSISTENT WITH PREVIOUSLY REPORTED AND CALLED VALUE. Performed at [facility], [place] [address] [place], [place] [address]   Protime-INR     Status: Abnormal   Collection Time: 09/26/17  3:59 PM  Result Value Ref Range   Prothrombin Time 16.5 (H) 11.4 - 15.2 seconds   INR 1.35     Comment: Performed at [facility] [address] [place], [place] [address]  Troponin I     Status: None   Collection Time: 09/26/17  9:31 PM  Result Value Ref Range   Troponin I <0.03 <0.03 ng/mL    Comment: Performed at [facility] [address] [place], [place] [address]  PSA     Status: Abnormal   Collection Time: 09/26/17  9:31 PM  Result Value Ref Range   Prostatic Specific Antigen 84.83 (H) 0.00 -  4.00 ng/mL    Comment: (NOTE) While PSA levels of <=4.0 ng/ml are reported as reference range, some men with levels below 4.0 ng/ml can have prostate cancer and many men with PSA above 4.0 ng/ml do not have prostate cancer.  Other tests such as free PSA, age specific reference ranges, PSA velocity and PSA doubling time may be helpful especially in men less than 60 years  old. Performed at [facility], [place] [address] [place], [place] [address]   Basic metabolic panel     Status: Abnormal   Collection Time: 09/27/17  2:42 AM  Result Value Ref Range   Sodium 137 135 - 145 mmol/L   Potassium 4.7 3.5 - 5.1 mmol/L   Chloride 100 98 - 111 mmol/L    Comment: Please note change in reference range.   CO2 27 22 - 32 mmol/L   Glucose, Bld 109 (H) 70 - 99 mg/dL    Comment: Please note change in reference range.   BUN 17 8 - 23 mg/dL    Comment: Please note change in reference range.   Creatinine, Ser 0.95 0.61 - 1.24 mg/dL   Calcium 8.3 (L) 8.9 - 10.3 mg/dL   GFR calc non Af Amer >60 >60 mL/min   GFR calc Af Amer >60 >60 mL/min    Comment: (NOTE) The eGFR has been calculated using the CKD EPI equation. This calculation has not been validated in all clinical situations. eGFR's persistently <60 mL/min signify possible Chronic Kidney Disease.    Anion gap 10 5 - 15    Comment: Performed at [facility] [address], [place], [place] [address]  CBC     Status: Abnormal   Collection Time: 09/27/17  2:42 AM  Result Value Ref Range   WBC 7.3 4.0 - 10.5 K/uL   RBC 4.03 (L) 4.22 - 5.81 MIL/uL   Hemoglobin 12.0 (L) 13.0 - 17.0 g/dL   HCT 37.6 (L) 39.0 - 52.0 %   MCV 93.3 78.0 - 100.0 fL   MCH 29.8 26.0 - 34.0 pg   MCHC 31.9 30.0 - 36.0 g/dL   RDW 14.4 11.5 - 15.5 %   Platelets 256 150 - 400 K/uL    Comment: Performed at [facility], [place] [address] [place], [place] [address]  Heparin level (unfractionated)     Status: Abnormal   Collection Time: 09/27/17  2:42 AM    Result Value Ref Range   Heparin Unfractionated 0.26 (L) 0.30 - 0.70 IU/mL    Comment: (NOTE) If heparin results are below expected values, and patient dosage has  been confirmed, suggest follow up testing of antithrombin III levels. Performed at [facility], [place] [address], [place], [place] [address]   Heparin level (unfractionated)     Status: None   Collection Time: 09/27/17 12:06 PM  Result Value Ref Range   Heparin Unfractionated 0.38 0.30 - 0.70 IU/mL    Comment: (NOTE) If heparin results are below expected values, and patient dosage has  been confirmed, suggest follow up testing of antithrombin III levels. Performed at [facility], [place] [address] [place], [place] [address]   Heparin level (unfractionated)     Status: None   Collection Time: 09/28/17  2:17 AM  Result Value Ref Range   Heparin Unfractionated 0.52 0.30 - 0.70 IU/mL    Comment: (NOTE) If heparin results are below expected values, and patient dosage has  been confirmed, suggest follow up testing of antithrombin III levels. Performed at [facility], [place] [address], [place] [address]   CBC     Status: Abnormal   Collection Time: 09/28/17  2:17 AM  Result Value Ref Range   WBC 7.7 4.0 - 10.5 K/uL   RBC 3.70 (L) 4.22 - 5.81 MIL/uL   Hemoglobin 11.0 (L) 13.0 - 17.0 g/dL   HCT 33.6 (  L) 39.0 - 52.0 %   MCV 90.8 78.0 - 100.0 fL   MCH 29.7 26.0 - 34.0 pg   MCHC 32.7 30.0 - 36.0 g/dL   RDW 13.8 11.5 - 15.5 %   Platelets 250 150 - 400 K/uL    Comment: Performed at [facility], [place] [address] [place],  [address]    ECG   N/A  Telemetry   A-fib with RVR, PVC's - Personally Reviewed  Radiology    Ct Abdomen Pelvis W Wo Contrast  Result Date: 09/27/2017 CLINICAL DATA:  Prostate cancer.  Lung mass.  Staging assessment EXAM: CT ABDOMEN AND PELVIS WITHOUT AND WITH CONTRAST TECHNIQUE: Multidetector CT imaging of the abdomen and pelvis was performed following the  standard protocol before and following the bolus administration of intravenous contrast. CONTRAST:  135m OMNIPAQUE IOHEXOL 300 MG/ML  SOLN COMPARISON:  CT abdomen report from 03/26/2012. CT chest from 09/26/2017 FINDINGS: Lower chest: Approximately 10.2 by 9.0 cm right lower lobe mass with some central calcifications and internal heterogeneity, and surrounding consolidation in the right lower lobe as shown on recent chest CT. Trace bilateral pleural effusions. Suspected right lower paraesophageal adenopathy is only partially included on today's exam on image 1/8. There is a moderate degree of atelectasis in the right middle lobe mild atelectasis in the left lower lobe. Moderate cardiomegaly. Mild prominence of inferior pericardial fluid. Hepatobiliary: Heterogeneous 6.1 by 5.2 cm right hepatic lobe mass on image 35/8 has a targetoid appearance. Rim enhancing 2.6 by 2.2 cm lesion in segment 4b of the liver on image 42/8 likewise favoring malignancy. Equivocal accentuated density in segment 4a of the liver on image 22/8 measuring 1.1 cm in diameter, possibly a third lesion. Pancreas: Unremarkable Spleen: Nonspecific hypodense 2.0 by 1.6 cm lesion anteriorly in the spleen on image 22/8. Adrenals/Urinary Tract: The adrenal glands appear normal. Indistinct somewhat triangular 1.6 by 1.2 cm hypodensity in the right mid kidney laterally on image 22/13 is nonspecific. 0.8 cm hypodensity in the left kidney upper pole parenchyma on image 11/13, nonspecific. Indistinct 1.2 by 1.1 cm hypodensity in the left mid kidney anteriorly on image 20/13. These do not have the typical sharply defined appearance of cysts. Urinary bladder unremarkable. No urinary tract calculi are identified. Stomach/Bowel: Unremarkable Vascular/Lymphatic: Minimal atherosclerotic calcification the left common iliac artery. No pathologic adenopathy. Reproductive: The prostate gland measures 4.9 by 4.6 by 6.1 cm (volume = 72 cm^3). Other: Small amount of  localized perihepatic ascites adjacent to the right hepatic lobe and near the right hepatic lobe lesion. Small amount of upper pelvic ascites and presacral edema. Subcutaneous edema along the flanks and lower anterior abdominal wall. Musculoskeletal: Degenerative facet arthropathy at L4-5 and L5-S1 with grade 1 degenerative anterolisthesis at L4-5. IMPRESSION: 1. Large complex masses in the right lower lobe of the lung and in the right hepatic lobe. Smaller liver lesions in the left hepatic lobe. Nonspecific lesions in the spleen and kidneys. No significant pathologic adenopathy. This would not be a characteristic metastatic pattern for prostate cancer and is not a specific metastatic and imaging pattern to suggest an individual etiology. Possibilities might include metastatic lung cancer, melanoma, metastatic hepatocellular carcinoma, or a variety of other possibilities. Tissue diagnosis recommended. 2. Moderate prostatomegaly. 3. Scattered mild ascites. 4. Moderate cardiomegaly.  Small inferior pericardial effusion. 5. Trace bilateral pleural effusions. Electronically Signed   By: WVan ClinesM.D.   On: 09/27/2017 23:00   Dg Chest 1 View  Result Date: 09/27/2017 CLINICAL DATA:  Status post right thoracentesis today. EXAM: CHEST  1 VIEW COMPARISON:  Single-view of the chest earlier today. CT chest 09/26/2017. FINDINGS: Right pleural effusion is decreased after thoracentesis. No pneumothorax. No other change. IMPRESSION: Decreased right pleural effusion after thoracentesis. Negative for pneumothorax. Persistent right basilar opacity consistent with residual pleural fluid and pulmonary mass. Electronically Signed   By: Inge Rise M.D.   On: 09/27/2017 12:03   Dg Chest 2 View  Result Date: 09/27/2017 CLINICAL DATA:  Worsening shortness of breath and bilateral lower extremity swelling over the past several days. EXAM: CHEST - 2 VIEW COMPARISON:  CT chest 09/26/2017. FINDINGS: Right pleural effusion  and dense opacity in the right lower chest consistent with mass seen on the prior CT is unchanged. A very small left pleural effusion and mild basilar atelectasis noted. Heart size is enlarged. No pneumothorax. No acute bony abnormality. IMPRESSION: No change in a right pleural effusion and right lower lobe opacity consistent with the mass lesion seen on prior CT. Cardiomegaly without edema. Tiny left pleural effusion and mild basilar atelectasis. Electronically Signed   By: Inge Rise M.D.   On: 09/27/2017 10:58   Ir Thoracentesis Asp Pleural Space W/img Guide  Result Date: 09/27/2017 INDICATION: Symptomatic right pleural effusion - request for diagnostic thoracentesis EXAM: ULTRASOUND GUIDED RIGHT THORACENTESIS MEDICATIONS: 10 mL 2 % Lidocaine. COMPLICATIONS: None immediate. PROCEDURE: An ultrasound guided thoracentesis was thoroughly discussed with the patient and questions answered. The benefits, risks, alternatives and complications were also discussed. The patient understands and wishes to proceed with the procedure. Written consent was obtained. Ultrasound was performed to localize and mark an adequate pocket of fluid in the right chest. The area was then prepped and draped in the normal sterile fashion. 2% Lidocaine was used for local anesthesia. Under ultrasound guidance a 6 Fr Safe-T-Centesis catheter was introduced. Thoracentesis was performed. The catheter was removed and a dressing applied. FINDINGS: A total of approximately 700 mL of golden yellow fluid was removed. Samples were sent to the laboratory as requested by the clinical team. IMPRESSION: Successful ultrasound guided right thoracentesis yielding 700 mL of pleural fluid. Read by Candiss Norse, PA-C Electronically Signed   By: Jacqulynn Cadet M.D.   On: 09/27/2017 11:27    Cardiac Studies   LV EF: 10% -   15%  ------------------------------------------------------------------- Indications:      CHF -  428.0.  ------------------------------------------------------------------- History:   PMH:   Non-ischemic cardiomyopathy.  Congestive heart failure.  ------------------------------------------------------------------- Study Conclusions  - Left ventricle: The cavity size was moderately dilated. Wall   thickness was normal. There was adverse spherical remodeling. The   estimated ejection fraction was in the range of 10% to 15%.   Severe diffuse hypokinesis with no identifiable regional   variations. - Mitral valve: There was mild to moderate regurgitation directed   posteriorly. - Left atrium: The atrium was moderately dilated. - Right ventricle: The cavity size was severely dilated. Systolic   function was severely reduced. - Right atrium: The atrium was moderately dilated. - Pericardium, extracardiac: A small pericardial effusion was   identified along the right atrial free wall. There was no   evidence of hemodynamic compromise  Assessment   Active Problems:   Acute systolic heart failure (HCC)   Atrial fibrillation with RVR (HCC)   Acute on chronic combined systolic and diastolic congestive heart failure ([place])   Plan   1. Mr. Sem is breathing better today - good diuresis overnight. CT scan very concerning for  metastatic lung CA to liver. Will contact oncology for evaluation per patient request - will need to pursue liver biopsy. BP soft - remains in RVR. Continue amiodarone. Decrease coreg to 3.125 mg BID tonight. Labs in am tomorrow. WIll try to pursue TEE/DCCV tomorrow. Keep NPO p MN, except sips with meds.  Time Spent Directly with Patient:  I have spent a total of 25 minutes with the patient reviewing hospital notes, telemetry, EKGs, labs and examining the patient as well as establishing an assessment and plan that was discussed personally with the patient.  > 50% of time was spent in direct patient care.  Length of Stay:  LOS: 2 days   Pixie Casino,  MD, [facility], [place] Director of the Advanced Lipid Disorders &  Cardiovascular Risk Reduction Clinic Diplomate of the American Board of Clinical Lipidology Attending Cardiologist  Direct Dial: [phone]  Fax: [phone]  Website:  [email or web address] 09/28/2017, 11:44 AM

## 2017-09-28 NOTE — Plan of Care (Signed)
  Problem: Education: Goal: Knowledge of General Education information will improve Outcome: Progressing   Problem: Education: Goal: Knowledge of General Education information will improve Outcome: Progressing   Problem: Clinical Measurements: Goal: Respiratory complications will improve Outcome: Progressing Goal: Cardiovascular complication will be avoided Outcome: Progressing   Problem: Pain Managment: Goal: General experience of comfort will improve Outcome: Progressing   Problem: Skin Integrity: Goal: Risk for impaired skin integrity will decrease Outcome: Progressing

## 2017-09-28 NOTE — Progress Notes (Signed)
ANTICOAGULATION CONSULT NOTE  Pharmacy Consult for Heparin Indication: atrial fibrillation  No Known Allergies  Patient Measurements: Height: 5\' 10"  (177.8 cm) Weight: 251 lb 12.3 oz (114.2 kg) IBW/kg (Calculated) : 73 Heparin Dosing Weight: 97kg  Vital Signs: Temp: 97.9 F (36.6 C) (07/18 0553) Temp Source: Oral (07/18 0553) BP: 86/76 (07/18 0553) Pulse Rate: 107 (07/18 0553)  Labs: Recent Labs    09/26/17 0955 09/26/17 1102  09/26/17 1559 09/26/17 2131 09/27/17 0242 09/27/17 1206 09/28/17 0217  HGB 11.4*  --   --   --   --  12.0*  --  11.0*  HCT 34.0*  --   --   --   --  37.6*  --  33.6*  PLT 336  --   --   --   --  256  --  250  APTT  --  31  --  71*  --   --   --   --   LABPROT  --  16.3*  --  16.5*  --   --   --   --   INR  --  1.32  --  1.35  --   --   --   --   HEPARINUNFRC  --   --    < > 0.20*  --  0.26* 0.38 0.52  CREATININE  --  1.22  --  1.10  --  0.95  --   --   TROPONINI  --  0.04*  --  0.03* <0.03  --   --   --    < > = values in this interval not displayed.    Estimated Creatinine Clearance: 95.5 mL/min (by C-G formula based on SCr of 0.95 mg/dL).   Medical History: Past Medical History:  Diagnosis Date  . CHF (congestive heart failure) ([place])   . Chronic systolic heart failure ([place]) 04/15/2015    EF 30-35% by echo 2016 , [facility]   . Hypertension   . Lung mass 04/15/2015    Never biopsied. History of PET scan that raised concern.   . Metabolic syndrome 10/13/8001  . OSA on CPAP   . Paroxysmal atrial fibrillation ([place]) 04/15/2015   On apixaban   . Prostate cancer ([place])   . Thrombocytopenia ([place]) 04/15/2015    Medications:  Infusions:  . sodium chloride    . amiodarone 30 mg/hr (09/27/17 2259)  . heparin 1,850 Units/hr (09/28/17 0533)    Assessment: 67 yom presented to the ED with afib with RVR from cardiologist office. To start IV heparin for anticoagulation. He has been on xarelto in the past but this was stopped. Hgb stable.   Heparin  level is therapeutic x 2 on 1850 units/hr.   Goal of Therapy:  Heparin level 0.3-0.7 units/ml Monitor platelets by anticoagulation protocol: Yes   Plan:  Continue IV Heparin to 1850 units/hr Daily heparin level and CBC  Isaias Sakai, Pharm D PGY1 Pharmacy Resident  Phone [phone] 09/28/2017      9:44 AM

## 2017-09-29 ENCOUNTER — Encounter (HOSPITAL_COMMUNITY): Payer: Self-pay | Admitting: *Deleted

## 2017-09-29 ENCOUNTER — Inpatient Hospital Stay (HOSPITAL_COMMUNITY): Payer: 59 | Admitting: Anesthesiology

## 2017-09-29 ENCOUNTER — Inpatient Hospital Stay (HOSPITAL_COMMUNITY): Payer: 59

## 2017-09-29 ENCOUNTER — Encounter (HOSPITAL_COMMUNITY)
Admission: EM | Disposition: A | Discharge status: Home Health | Payer: Self-pay | Source: Home / Self Care | Attending: Interventional Cardiology

## 2017-09-29 DIAGNOSIS — I4891 Unspecified atrial fibrillation: Secondary | ICD-10-CM

## 2017-09-29 DIAGNOSIS — I340 Nonrheumatic mitral (valve) insufficiency: Secondary | ICD-10-CM

## 2017-09-29 HISTORY — PX: TEE WITHOUT CARDIOVERSION: SHX5443

## 2017-09-29 HISTORY — PX: CARDIOVERSION: SHX1299

## 2017-09-29 LAB — BASIC METABOLIC PANEL
Anion gap: 10 (ref 5–15)
BUN: 14 mg/dL (ref 8–23)
CALCIUM: 8.3 mg/dL — AB (ref 8.9–10.3)
CO2: 32 mmol/L (ref 22–32)
Chloride: 96 mmol/L — ABNORMAL LOW (ref 98–111)
Creatinine, Ser: 1.07 mg/dL (ref 0.61–1.24)
GFR calc non Af Amer: >60 mL/min (ref >60)
Glucose, Bld: 95 mg/dL (ref 70–99)
Potassium: 3.8 mmol/L (ref 3.5–5.1)
Sodium: 138 mmol/L (ref 135–145)

## 2017-09-29 LAB — CBC
HEMATOCRIT: 34.1 % — AB (ref 39.0–52.0)
Hemoglobin: 11.1 g/dL — ABNORMAL LOW (ref 13.0–17.0)
MCH: 29.4 pg (ref 26.0–34.0)
MCHC: 32.6 g/dL (ref 30.0–36.0)
MCV: 90.5 fL (ref 78.0–100.0)
Platelets: 262 10*3/uL (ref 150–400)
RBC: 3.77 MIL/uL — ABNORMAL LOW (ref 4.22–5.81)
RDW: 14.1 % (ref 11.5–15.5)
WBC: 6.7 10*3/uL (ref 4.0–10.5)

## 2017-09-29 LAB — PROTIME-INR
INR: 1.28
PROTHROMBIN TIME: 15.9 s — AB (ref 11.4–15.2)

## 2017-09-29 LAB — HEPARIN LEVEL (UNFRACTIONATED): Heparin Unfractionated: 0.44 IU/mL (ref 0.30–0.70)

## 2017-09-29 SURGERY — ECHOCARDIOGRAM, TRANSESOPHAGEAL
Anesthesia: General

## 2017-09-29 MED ORDER — LIDOCAINE HCL (CARDIAC) PF 100 MG/5ML IV SOSY
PREFILLED_SYRINGE | INTRAVENOUS | Status: DC | PRN
  Administered 2017-09-29: 30 mg via INTRAVENOUS

## 2017-09-29 MED ORDER — PROPOFOL 10 MG/ML IV BOLUS
INTRAVENOUS | Status: DC | PRN
  Administered 2017-09-29: 50 mg via INTRAVENOUS
  Administered 2017-09-29: 30 mg via INTRAVENOUS

## 2017-09-29 MED ORDER — AMIODARONE HCL 200 MG PO TABS
400.0000 mg | ORAL_TABLET | Freq: Two times a day (BID) | ORAL | Status: DC
  Administered 2017-09-29 – 2017-10-03 (×10): 400 mg via ORAL
  Filled 2017-09-29 (×10): qty 2

## 2017-09-29 NOTE — Transfer of Care (Signed)
Immediate Anesthesia Transfer of Care Note  Patient: Logan [name]  Procedure(s) Performed: TRANSESOPHAGEAL ECHOCARDIOGRAM (TEE) (N/A ) CARDIOVERSION (N/A )  Patient Location: Endoscopy Unit  Anesthesia Type:General  Level of Consciousness: awake, alert  and oriented  Airway & Oxygen Therapy: Patient Spontanous Breathing and Patient connected to nasal cannula oxygen  Post-op Assessment: Report given to RN, Post -op Vital signs reviewed and stable and Patient moving all extremities X 4  Post vital signs: Reviewed and stable  Last Vitals:  Vitals Value Taken Time  BP 117/50 09/29/2017 12:58 PM  Temp    Pulse 47 09/29/2017 12:58 PM  Resp 22 09/29/2017 12:58 PM  SpO2 93 % 09/29/2017 12:58 PM  Vitals shown include unvalidated device data.  Last Pain:  Vitals:   09/29/17 1258  TempSrc: Oral  PainSc:       Patients Stated Pain Goal: 0 (44/01/02 7253)  Complications: No apparent anesthesia complications

## 2017-09-29 NOTE — Progress Notes (Signed)
  Echocardiogram Echocardiogram Transesophageal has been performed.  Logan [name] 09/29/2017, 12:51 PM

## 2017-09-29 NOTE — Progress Notes (Signed)
Call return see orders

## 2017-09-29 NOTE — Anesthesia Postprocedure Evaluation (Signed)
Anesthesia Post Note  Patient: Logan [name]  Procedure(s) Performed: TRANSESOPHAGEAL ECHOCARDIOGRAM (TEE) (N/A ) CARDIOVERSION (N/A )     Patient location during evaluation: Endoscopy Anesthesia Type: General Level of consciousness: awake and alert Pain management: pain level controlled Vital Signs Assessment: post-procedure vital signs reviewed and stable Respiratory status: spontaneous breathing, nonlabored ventilation, respiratory function stable and patient connected to nasal cannula oxygen Cardiovascular status: blood pressure returned to baseline and stable Postop Assessment: no apparent nausea or vomiting Anesthetic complications: no    Last Vitals:  Vitals:   09/29/17 1310 09/29/17 1330  BP: 97/68 90/75  Pulse: 68   Resp: 20 (!) 25  Temp:    SpO2: 100%     Last Pain:  Vitals:   09/29/17 1310  TempSrc:   PainSc: 0-No pain                 Acsa Estey

## 2017-09-29 NOTE — Progress Notes (Addendum)
Patient has home CPAP and stated he would apply it when he is ready if he decides to wear it.

## 2017-09-29 NOTE — Anesthesia Preprocedure Evaluation (Addendum)
Anesthesia Evaluation  Patient identified by MRN, date of birth, ID band Patient awake    Reviewed: Allergy & Precautions, NPO status , Patient's Chart, lab work & pertinent test results  History of Anesthesia Complications Negative for: history of anesthetic complications  Airway Mallampati: III  TM Distance: >3 FB Neck ROM: Full    Dental  (+) Teeth Intact   Pulmonary shortness of breath, sleep apnea and Continuous Positive Airway Pressure Ventilation , former smoker,  Lung mass   breath sounds clear to auscultation       Cardiovascular hypertension, Pt. on medications +CHF  + dysrhythmias Atrial Fibrillation  Rhythm:Irregular Rate:Tachycardia     Neuro/Psych negative neurological ROS  negative psych ROS   GI/Hepatic negative GI ROS, Neg liver ROS,   Endo/Other  Morbid obesity  Renal/GU negative Renal ROS     Musculoskeletal   Abdominal   Peds  Hematology  (+) anemia ,   Anesthesia Other Findings afib with ef 30% in 2016  Reproductive/Obstetrics                             Anesthesia Physical Anesthesia Plan  ASA: III  Anesthesia Plan: General   Post-op Pain Management:    Induction:   PONV Risk Score and Plan: 2 and Treatment may vary due to age or medical condition  Airway Management Planned: Nasal Cannula  Additional Equipment:   Intra-op Plan:   Post-operative Plan:   Informed Consent: I have reviewed the patients History and Physical, chart, labs and discussed the procedure including the risks, benefits and alternatives for the proposed anesthesia with the patient or authorized representative who has indicated his/her understanding and acceptance.   Dental advisory given  Plan Discussed with: CRNA and Surgeon  Anesthesia Plan Comments:        Anesthesia Quick Evaluation

## 2017-09-29 NOTE — Progress Notes (Signed)
Patient B.P. Left arm 73/35 and right arm 74/56 H.R. At. Fib 106 skin warm and dry no complaints voiced. Tim R.N. Aware and Text page Dr. Morrie Sheldon

## 2017-09-29 NOTE — Progress Notes (Signed)
ANTICOAGULATION CONSULT NOTE  Pharmacy Consult for Heparin Indication: atrial fibrillation  No Known Allergies  Patient Measurements: Height: 5\' 10"  (177.8 cm) Weight: 259 lb 14.8 oz (117.9 kg) IBW/kg (Calculated) : 73 Heparin Dosing Weight: 97kg  Vital Signs: Temp: 98.7 F (37.1 C) (07/19 0504) Temp Source: Oral (07/19 0504) BP: 96/84 (07/19 0909) Pulse Rate: 120 (07/19 0909)  Labs: Recent Labs    09/26/17 1102  09/26/17 1559 09/26/17 2131 09/27/17 0242 09/27/17 1206 09/28/17 0217 09/29/17 0434  HGB  --   --   --   --  12.0*  --  11.0* 11.1*  HCT  --   --   --   --  37.6*  --  33.6* 34.1*  PLT  --   --   --   --  256  --  250 262  APTT 31  --  71*  --   --   --   --   --   LABPROT 16.3*  --  16.5*  --   --   --   --  15.9*  INR 1.32  --  1.35  --   --   --   --  1.28  HEPARINUNFRC  --    < > 0.20*  --  0.26* 0.38 0.52 0.44  CREATININE 1.22  --  1.10  --  0.95  --   --  1.07  TROPONINI 0.04*  --  0.03* <0.03  --   --   --   --    < > = values in this interval not displayed.    Estimated Creatinine Clearance: 86.2 mL/min (by C-G formula based on SCr of 1.07 mg/dL).   Medical History: Past Medical History:  Diagnosis Date  . CHF (congestive heart failure) ([place])   . Chronic systolic heart failure ([place]) 04/15/2015    EF 30-35% by echo 2016 , [facility]   . Hypertension   . Lung mass 04/15/2015    Never biopsied. History of PET scan that raised concern.   . Metabolic syndrome 0/11/3265  . OSA on CPAP   . Paroxysmal atrial fibrillation ([place]) 04/15/2015   On apixaban   . Prostate cancer ([place])   . Thrombocytopenia ([place]) 04/15/2015    Medications:  Infusions:  . sodium chloride    . heparin 1,850 Units/hr (09/28/17 2127)    Assessment: 34 yom presented to the ED with afib with RVR from cardiologist office. Pharmacy consulted to dose heparin. He has been on xarelto in the past but stopped taking with ongoing hemoptysis. CBC stable. No bleed documented.  Heparin level  is therapeutic.  Goal of Therapy:  Heparin level 0.3-0.7 units/ml Monitor platelets by anticoagulation protocol: Yes   Plan:  Continue IV Heparin to 1850 units/hr Monitor daily heparin level and CBC, s/sx bleeding Planning liver bx 7/22 TEE/DCCV 7/19  Elicia Lamp, PharmD, BCPS Clinical Pharmacist Clinical phone [phone] Please check AMION for all [place] contact numbers 09/29/2017 9:42 AM

## 2017-09-29 NOTE — Progress Notes (Signed)
Text page Dr. Paticia Stack about low B.P. See orders to hold I.V. lasix tonight and give P.O. Amiodarone.

## 2017-09-29 NOTE — Progress Notes (Signed)
PCCM Interval Note  Planning for hepatic bx in IR - appreciate assistance.  Will follow results with you.   Baltazar Apo, MD, PhD 09/29/2017, 3:34 PM [facility] [phone] or if no answer [phone]

## 2017-09-29 NOTE — Progress Notes (Signed)
DAILY PROGRESS NOTE   Patient Name: Logan [name] Date of Encounter: 09/29/2017  Chief Complaint   Feels better today  Patient Profile   67 yo male with history of nonischemic cardiomyopathy, EF 25 to 30%, PAF and known lung mass, presents with acute decompensated systolic congestive heart failure and pleural effusion, admitted for diuresis and thoracentesis.  Subjective   BP remains soft - rate-controlled a-fib. Diuresed another 1L overnight- now 2.5L negative. Weight recorded as going up - ?accuracy. Appreciate IR recs for biopsy on Monday. Plan for TEE/DCCV today.  Objective   Vitals:   09/29/17 0040 09/29/17 0257 09/29/17 0504 09/29/17 0909  BP: (!) 74/56 (!) '89/60 91/64 96/84 '$  Pulse:   (!) 127 (!) 120  Resp: 20 (!) 47 (!) 26   Temp:   98.7 F (37.1 C)   TempSrc:   Oral   SpO2: 95% 95% 90%   Weight:   259 lb 14.8 oz (117.9 kg)   Height:        Intake/Output Summary (Last 24 hours) at 09/29/2017 1055 Last data filed at 09/29/2017 1017 Gross per 24 hour  Intake 546.13 ml  Output 1725 ml  Net -1178.87 ml   Filed Weights   09/27/17 0542 09/28/17 0553 09/29/17 0504  Weight: 240 lb 15.4 oz (109.3 kg) 251 lb 12.3 oz (114.2 kg) 259 lb 14.8 oz (117.9 kg)    Physical Exam   General appearance: alert and no distress Neck: JVD - 1 cm above sternal notch, no carotid bruit, supple, symmetrical, trachea midline and thyroid not enlarged, symmetric, no tenderness/mass/nodules Lungs: diminished breath sounds RLL Heart: irregularly irregular rhythm and tachycardic Abdomen: soft, non-tender; bowel sounds normal; no masses,  no organomegaly Extremities: edema trade LE pitting edema Pulses: 2+ and symmetric Skin: Skin color, texture, turgor normal. No rashes or lesions Neurologic: Grossly normal Psych: Pleasant  Inpatient Medications    Scheduled Meds: . amiodarone  400 mg Oral BID  . aspirin EC  81 mg Oral Daily  . carvedilol  3.125 mg Oral BID WC  . furosemide   60 mg Intravenous TID  . sacubitril-valsartan  1 tablet Oral BID  . sodium chloride flush  3 mL Intravenous Q12H    Continuous Infusions: . sodium chloride    . heparin 1,850 Units/hr (09/28/17 2127)    PRN Meds: sodium chloride, acetaminophen, lidocaine, ondansetron (ZOFRAN) IV, sodium chloride flush   Labs   Results for orders placed or performed during the hospital encounter of 09/26/17 (from the past 48 hour(s))  Heparin level (unfractionated)     Status: None   Collection Time: 09/27/17 12:06 PM  Result Value Ref Range   Heparin Unfractionated 0.38 0.30 - 0.70 IU/mL    Comment: (NOTE) If heparin results are below expected values, and patient dosage has  been confirmed, suggest follow up testing of antithrombin III levels. Performed at [facility], [place] [address], [place], [place] [address]   Heparin level (unfractionated)     Status: None   Collection Time: 09/28/17  2:17 AM  Result Value Ref Range   Heparin Unfractionated 0.52 0.30 - 0.70 IU/mL    Comment: (NOTE) If heparin results are below expected values, and patient dosage has  been confirmed, suggest follow up testing of antithrombin III levels. Performed at [facility], [place] [address], [place], [place] [address]   CBC     Status: Abnormal   Collection Time: 09/28/17  2:17 AM  Result Value Ref Range  WBC 7.7 4.0 - 10.5 K/uL   RBC 3.70 (L) 4.22 - 5.81 MIL/uL   Hemoglobin 11.0 (L) 13.0 - 17.0 g/dL   HCT 33.6 (L) 39.0 - 52.0 %   MCV 90.8 78.0 - 100.0 fL   MCH 29.7 26.0 - 34.0 pg   MCHC 32.7 30.0 - 36.0 g/dL   RDW 13.8 11.5 - 15.5 %   Platelets 250 150 - 400 K/uL    Comment: Performed at [facility] [address] [place], [place] [address]  Heparin level (unfractionated)     Status: None   Collection Time: 09/29/17  4:34 AM  Result Value Ref Range   Heparin Unfractionated 0.44 0.30 - 0.70 IU/mL    Comment: (NOTE) If heparin results are below expected values, and patient dosage has    been confirmed, suggest follow up testing of antithrombin III levels. Performed at [facility], [place] [address], [place], [place] [address]   CBC     Status: Abnormal   Collection Time: 09/29/17  4:34 AM  Result Value Ref Range   WBC 6.7 4.0 - 10.5 K/uL   RBC 3.77 (L) 4.22 - 5.81 MIL/uL   Hemoglobin 11.1 (L) 13.0 - 17.0 g/dL   HCT 34.1 (L) 39.0 - 52.0 %   MCV 90.5 78.0 - 100.0 fL   MCH 29.4 26.0 - 34.0 pg   MCHC 32.6 30.0 - 36.0 g/dL   RDW 14.1 11.5 - 15.5 %   Platelets 262 150 - 400 K/uL    Comment: Performed at [facility], [place] [address] [place], [place] [address]  Basic metabolic panel     Status: Abnormal   Collection Time: 09/29/17  4:34 AM  Result Value Ref Range   Sodium 138 135 - 145 mmol/L   Potassium 3.8 3.5 - 5.1 mmol/L   Chloride 96 (L) 98 - 111 mmol/L    Comment: Please note change in reference range.   CO2 32 22 - 32 mmol/L   Glucose, Bld 95 70 - 99 mg/dL    Comment: Please note change in reference range.   BUN 14 8 - 23 mg/dL    Comment: Please note change in reference range.   Creatinine, Ser 1.07 0.61 - 1.24 mg/dL   Calcium 8.3 (L) 8.9 - 10.3 mg/dL   GFR calc non Af Amer >60 >60 mL/min   GFR calc Af Amer >60 >60 mL/min    Comment: (NOTE) The eGFR has been calculated using the CKD EPI equation. This calculation has not been validated in all clinical situations. eGFR's persistently <60 mL/min signify possible Chronic Kidney Disease.    Anion gap 10 5 - 15    Comment: Performed at [facility] [address] [place], [place] [address]  Protime-INR     Status: Abnormal   Collection Time: 09/29/17  4:34 AM  Result Value Ref Range   Prothrombin Time 15.9 (H) 11.4 - 15.2 seconds   INR 1.28     Comment: Performed at [facility] [address] [place], [place] [address]    ECG   N/A  Telemetry   A-fib with RVR, PVC's - Personally Reviewed  Radiology    Ct Abdomen Pelvis W Wo Contrast  Result Date:  09/27/2017 CLINICAL DATA:  Prostate cancer.  Lung mass.  Staging assessment EXAM: CT ABDOMEN AND PELVIS WITHOUT AND WITH CONTRAST TECHNIQUE: Multidetector CT imaging of the abdomen and pelvis was performed following the standard protocol  before and following the bolus administration of intravenous contrast. CONTRAST:  159m OMNIPAQUE IOHEXOL 300 MG/ML  SOLN COMPARISON:  CT abdomen report from 03/26/2012. CT chest from 09/26/2017 FINDINGS: Lower chest: Approximately 10.2 by 9.0 cm right lower lobe mass with some central calcifications and internal heterogeneity, and surrounding consolidation in the right lower lobe as shown on recent chest CT. Trace bilateral pleural effusions. Suspected right lower paraesophageal adenopathy is only partially included on today's exam on image 1/8. There is a moderate degree of atelectasis in the right middle lobe mild atelectasis in the left lower lobe. Moderate cardiomegaly. Mild prominence of inferior pericardial fluid. Hepatobiliary: Heterogeneous 6.1 by 5.2 cm right hepatic lobe mass on image 35/8 has a targetoid appearance. Rim enhancing 2.6 by 2.2 cm lesion in segment 4b of the liver on image 42/8 likewise favoring malignancy. Equivocal accentuated density in segment 4a of the liver on image 22/8 measuring 1.1 cm in diameter, possibly a third lesion. Pancreas: Unremarkable Spleen: Nonspecific hypodense 2.0 by 1.6 cm lesion anteriorly in the spleen on image 22/8. Adrenals/Urinary Tract: The adrenal glands appear normal. Indistinct somewhat triangular 1.6 by 1.2 cm hypodensity in the right mid kidney laterally on image 22/13 is nonspecific. 0.8 cm hypodensity in the left kidney upper pole parenchyma on image 11/13, nonspecific. Indistinct 1.2 by 1.1 cm hypodensity in the left mid kidney anteriorly on image 20/13. These do not have the typical sharply defined appearance of cysts. Urinary bladder unremarkable. No urinary tract calculi are identified. Stomach/Bowel: Unremarkable  Vascular/Lymphatic: Minimal atherosclerotic calcification the left common iliac artery. No pathologic adenopathy. Reproductive: The prostate gland measures 4.9 by 4.6 by 6.1 cm (volume = 72 cm^3). Other: Small amount of localized perihepatic ascites adjacent to the right hepatic lobe and near the right hepatic lobe lesion. Small amount of upper pelvic ascites and presacral edema. Subcutaneous edema along the flanks and lower anterior abdominal wall. Musculoskeletal: Degenerative facet arthropathy at L4-5 and L5-S1 with grade 1 degenerative anterolisthesis at L4-5. IMPRESSION: 1. Large complex masses in the right lower lobe of the lung and in the right hepatic lobe. Smaller liver lesions in the left hepatic lobe. Nonspecific lesions in the spleen and kidneys. No significant pathologic adenopathy. This would not be a characteristic metastatic pattern for prostate cancer and is not a specific metastatic and imaging pattern to suggest an individual etiology. Possibilities might include metastatic lung cancer, melanoma, metastatic hepatocellular carcinoma, or a variety of other possibilities. Tissue diagnosis recommended. 2. Moderate prostatomegaly. 3. Scattered mild ascites. 4. Moderate cardiomegaly.  Small inferior pericardial effusion. 5. Trace bilateral pleural effusions. Electronically Signed   By: WVan ClinesM.D.   On: 09/27/2017 23:00   Dg Chest 1 View  Result Date: 09/27/2017 CLINICAL DATA:  Status post right thoracentesis today. EXAM: CHEST  1 VIEW COMPARISON:  Single-view of the chest earlier today. CT chest 09/26/2017. FINDINGS: Right pleural effusion is decreased after thoracentesis. No pneumothorax. No other change. IMPRESSION: Decreased right pleural effusion after thoracentesis. Negative for pneumothorax. Persistent right basilar opacity consistent with residual pleural fluid and pulmonary mass. Electronically Signed   By: TInge RiseM.D.   On: 09/27/2017 12:03   Ir Thoracentesis Asp  Pleural Space W/img Guide  Result Date: 09/27/2017 INDICATION: Symptomatic right pleural effusion - request for diagnostic thoracentesis EXAM: ULTRASOUND GUIDED RIGHT THORACENTESIS MEDICATIONS: 10 mL 2 % Lidocaine. COMPLICATIONS: None immediate. PROCEDURE: An ultrasound guided thoracentesis was thoroughly discussed with the patient and questions answered. The benefits, risks, alternatives and complications were also  discussed. The patient understands and wishes to proceed with the procedure. Written consent was obtained. Ultrasound was performed to localize and mark an adequate pocket of fluid in the right chest. The area was then prepped and draped in the normal sterile fashion. 2% Lidocaine was used for local anesthesia. Under ultrasound guidance a 6 Fr Safe-T-Centesis catheter was introduced. Thoracentesis was performed. The catheter was removed and a dressing applied. FINDINGS: A total of approximately 700 mL of golden yellow fluid was removed. Samples were sent to the laboratory as requested by the clinical team. IMPRESSION: Successful ultrasound guided right thoracentesis yielding 700 mL of pleural fluid. Read by Candiss Norse, PA-C Electronically Signed   By: Jacqulynn Cadet M.D.   On: 09/27/2017 11:27    Cardiac Studies   LV EF: 10% -   15%  ------------------------------------------------------------------- Indications:      CHF - 428.0.  ------------------------------------------------------------------- History:   PMH:   Non-ischemic cardiomyopathy.  Congestive heart failure.  ------------------------------------------------------------------- Study Conclusions  - Left ventricle: The cavity size was moderately dilated. Wall   thickness was normal. There was adverse spherical remodeling. The   estimated ejection fraction was in the range of 10% to 15%.   Severe diffuse hypokinesis with no identifiable regional   variations. - Mitral valve: There was mild to moderate  regurgitation directed   posteriorly. - Left atrium: The atrium was moderately dilated. - Right ventricle: The cavity size was severely dilated. Systolic   function was severely reduced. - Right atrium: The atrium was moderately dilated. - Pericardium, extracardiac: A small pericardial effusion was   identified along the right atrial free wall. There was no   evidence of hemodynamic compromise  Assessment   Active Problems:   Acute systolic heart failure (HCC)   Atrial fibrillation with RVR (HCC)   Acute on chronic combined systolic and diastolic congestive heart failure (HCC)   Hepatic lesion   Plan   Mr. Gunderman is feeling better today - plan for TEE/DCCV today at 12:30. Will remain on IV heparin with plan for biopsy by IR on Monday. Creatinine stable with diuresis.   Time Spent Directly with Patient:  I have spent a total of 15 minutes with the patient reviewing hospital notes, telemetry, EKGs, labs and examining the patient as well as establishing an assessment and plan that was discussed personally with the patient.  > 50% of time was spent in direct patient care.  Length of Stay:  LOS: 3 days   Pixie Casino, MD, [facility], [place] Director of the Advanced Lipid Disorders &  Cardiovascular Risk Reduction Clinic Diplomate of the American Board of Clinical Lipidology Attending Cardiologist  Direct Dial: [phone]  Fax: [phone]  Website:  [email or web address] 09/29/2017, 10:55 AM

## 2017-09-29 NOTE — H&P (Signed)
   INTERVAL PROCEDURE H&P  History and Physical Interval Note:  09/29/2017 11:02 AM  Logan [name] has presented today for their planned procedure. The various methods of treatment have been discussed with the patient and family. After consideration of risks, benefits and other options for treatment, the patient has consented to the procedure.  The patients' outpatient history has been reviewed, patient examined, and no change in status from most recent office note within the past 30 days. I have reviewed the patients' chart and labs and will proceed as planned. Questions were answered to the patient's satisfaction.   Pixie Casino, MD, [facility], [place] Director of the Advanced Lipid Disorders &  Cardiovascular Risk Reduction Clinic Diplomate of the American Board of Clinical Lipidology Attending Cardiologist  Direct Dial: [phone]  Fax: [phone]  Website:  [email or web address] 09/29/2017, 11:02 AM

## 2017-09-29 NOTE — Consult Note (Signed)
Chief Complaint: Patient was seen in consultation today for liver lesion biopsy Chief Complaint  Patient presents with  . Atrial Fibrillation  . Ascites  . Shortness of Breath   at the request of Dr Franco Collet  Supervising Physician: Daryll Brod  Patient Status: [facility] - In-pt  History of Present Illness: Logan [name] is a 67 y.o. male   Admitted from ED with SOB and CHF Imaging reveals masses concerning for cancer per Dr Burr Medico Hx Prostate cancer   CT:  IMPRESSION: 1. Large complex masses in the right lower lobe of the lung and in the right hepatic lobe. Smaller liver lesions in the left hepatic lobe. Nonspecific lesions in the spleen and kidneys. No significant pathologic adenopathy. This would not be a characteristic metastatic pattern for prostate cancer and is not a specific metastatic and imaging pattern to suggest an individual etiology. Possibilities might include metastatic lung cancer, melanoma, metastatic hepatocellular carcinoma, or a variety of other possibilities. Tissue diagnosis recommended. 2. Moderate prostatomegaly. 3. Scattered mild ascites. 4. Moderate cardiomegaly.  Small inferior pericardial effusion. 5. Trace bilateral pleural effusions  Request for liver lesion biopsy Imaging reviewed with Dr Earleen Newport He approves liver lesion biopsy  Pt is scheduled for TEE today Will plan for liver lesion biopsy Mon 7/22--- pt aware  Past Medical History:  Diagnosis Date  . CHF (congestive heart failure) ([place])   . Chronic systolic heart failure ([place]) 04/15/2015    EF 30-35% by echo 2016 , [facility]   . Hypertension   . Lung mass 04/15/2015    Never biopsied. History of PET scan that raised concern.   . Metabolic syndrome 06/15/9673  . OSA on CPAP   . Paroxysmal atrial fibrillation ([place]) 04/15/2015   On apixaban   . Prostate cancer ([place])   . Thrombocytopenia ([place]) 04/15/2015    Past Surgical History:  Procedure Laterality Date  . CARDIAC CATHETERIZATION  N/A 04/27/2015   Procedure: Right/Left Heart Cath and Coronary Angiography;  Surgeon: [name], MD;  Location: [place] [facility];  Service: Cardiovascular;  Laterality: N/A;  . IR THORACENTESIS ASP PLEURAL SPACE W/IMG GUIDE  09/27/2017  . PROSTATE BIOPSY      Allergies: Patient has no known allergies.  Medications: Prior to Admission medications   Medication Sig Start Date End Date Taking? Authorizing Provider  aspirin 81 MG tablet Take 81 mg by mouth daily.   Yes [provider]  carvedilol (COREG) 12.5 MG tablet Take 1 tablet (12.5 mg total) by mouth 2 (two) times daily. 05/15/17  Yes Burtis Junes, NP  Cholecalciferol ([employer] VIT D 5000 HIGH-POTENCY) 5000 units capsule Take 5,000 Units by mouth daily.   Yes [provider]  furosemide (LASIX) 40 MG tablet Take 1 tablet (40 mg total) by mouth daily. 05/15/17  Yes Burtis Junes, NP  Multiple Vitamin (MULTIVITAMIN) tablet Take 1 tablet by mouth daily.   Yes [provider]  sacubitril-valsartan (ENTRESTO) 24-26 MG Take 1 tablet by mouth 2 (two) times daily.   Yes [provider]     Family History  Problem Relation Age of Onset  . Parkinson's disease Mother   . Hypertension Father     Social History   Socioeconomic History  . Marital status: Married    Spouse name: Not on file  . Number of children: Not on file  . Years of education: Not on file  . Highest education level: Not on file  Occupational History  . Not on  file  Social Needs  . Financial resource strain: Not on file  . Food insecurity:    Worry: Not on file    Inability: Not on file  . Transportation needs:    Medical: Not on file    Non-medical: Not on file  Tobacco Use  . Smoking status: Former Smoker    Years: 6.50    Types: Cigarettes, Cigars, Pipe    Last attempt to quit: 03/14/1974    Years since quitting: 43.5  . Smokeless tobacco: Never Used  Substance and Sexual Activity  . Alcohol use: Yes    Alcohol/week:  0.0 oz    Comment: 09/26/2017 "holidays only"  . Drug use: Never  . Sexual activity: Yes  Lifestyle  . Physical activity:    Days per week: Not on file    Minutes per session: Not on file  . Stress: Not on file  Relationships  . Social connections:    Talks on phone: Not on file    Gets together: Not on file    Attends religious service: Not on file    Active member of club or organization: Not on file    Attends meetings of clubs or organizations: Not on file    Relationship status: Not on file  Other Topics Concern  . Not on file  Social History Narrative  . Not on file    Review of Systems: A 12 point ROS discussed and pertinent positives are indicated in the HPI above.  All other systems are negative.  Review of Systems  Constitutional: Positive for fatigue. Negative for activity change and appetite change.  Respiratory: Positive for shortness of breath. Negative for cough.   Gastrointestinal: Positive for abdominal pain.  Neurological: Positive for weakness.  Psychiatric/Behavioral: Negative for behavioral problems and confusion.    Vital Signs: BP 91/64 (BP Location: Right Arm)   Pulse (!) 127   Temp 98.7 F (37.1 C) (Oral)   Resp (!) 26   Ht 5\' 10"  (1.778 m)   Wt 259 lb 14.8 oz (117.9 kg)   SpO2 90%   BMI 37.29 kg/m   Physical Exam  Constitutional: He is oriented to person, place, and time.  Cardiovascular: Normal rate and regular rhythm.  Pulmonary/Chest: Effort normal and breath sounds normal.  Abdominal: Soft. He exhibits distension.  Musculoskeletal: Normal range of motion.       Right lower leg: He exhibits edema.       Left lower leg: He exhibits edema.  Neurological: He is alert and oriented to person, place, and time.  Psychiatric: He has a normal mood and affect. His behavior is normal.  Nursing note and vitals reviewed.   Imaging: Ct Abdomen Pelvis W Wo Contrast  Result Date: 09/27/2017 CLINICAL DATA:  Prostate cancer.  Lung mass.  Staging  assessment EXAM: CT ABDOMEN AND PELVIS WITHOUT AND WITH CONTRAST TECHNIQUE: Multidetector CT imaging of the abdomen and pelvis was performed following the standard protocol before and following the bolus administration of intravenous contrast. CONTRAST:  11mL OMNIPAQUE IOHEXOL 300 MG/ML  SOLN COMPARISON:  CT abdomen report from 03/26/2012. CT chest from 09/26/2017 FINDINGS: Lower chest: Approximately 10.2 by 9.0 cm right lower lobe mass with some central calcifications and internal heterogeneity, and surrounding consolidation in the right lower lobe as shown on recent chest CT. Trace bilateral pleural effusions. Suspected right lower paraesophageal adenopathy is only partially included on today's exam on image 1/8. There is a moderate degree of atelectasis in the right middle lobe  mild atelectasis in the left lower lobe. Moderate cardiomegaly. Mild prominence of inferior pericardial fluid. Hepatobiliary: Heterogeneous 6.1 by 5.2 cm right hepatic lobe mass on image 35/8 has a targetoid appearance. Rim enhancing 2.6 by 2.2 cm lesion in segment 4b of the liver on image 42/8 likewise favoring malignancy. Equivocal accentuated density in segment 4a of the liver on image 22/8 measuring 1.1 cm in diameter, possibly a third lesion. Pancreas: Unremarkable Spleen: Nonspecific hypodense 2.0 by 1.6 cm lesion anteriorly in the spleen on image 22/8. Adrenals/Urinary Tract: The adrenal glands appear normal. Indistinct somewhat triangular 1.6 by 1.2 cm hypodensity in the right mid kidney laterally on image 22/13 is nonspecific. 0.8 cm hypodensity in the left kidney upper pole parenchyma on image 11/13, nonspecific. Indistinct 1.2 by 1.1 cm hypodensity in the left mid kidney anteriorly on image 20/13. These do not have the typical sharply defined appearance of cysts. Urinary bladder unremarkable. No urinary tract calculi are identified. Stomach/Bowel: Unremarkable Vascular/Lymphatic: Minimal atherosclerotic calcification the left  common iliac artery. No pathologic adenopathy. Reproductive: The prostate gland measures 4.9 by 4.6 by 6.1 cm (volume = 72 cm^3). Other: Small amount of localized perihepatic ascites adjacent to the right hepatic lobe and near the right hepatic lobe lesion. Small amount of upper pelvic ascites and presacral edema. Subcutaneous edema along the flanks and lower anterior abdominal wall. Musculoskeletal: Degenerative facet arthropathy at L4-5 and L5-S1 with grade 1 degenerative anterolisthesis at L4-5. IMPRESSION: 1. Large complex masses in the right lower lobe of the lung and in the right hepatic lobe. Smaller liver lesions in the left hepatic lobe. Nonspecific lesions in the spleen and kidneys. No significant pathologic adenopathy. This would not be a characteristic metastatic pattern for prostate cancer and is not a specific metastatic and imaging pattern to suggest an individual etiology. Possibilities might include metastatic lung cancer, melanoma, metastatic hepatocellular carcinoma, or a variety of other possibilities. Tissue diagnosis recommended. 2. Moderate prostatomegaly. 3. Scattered mild ascites. 4. Moderate cardiomegaly.  Small inferior pericardial effusion. 5. Trace bilateral pleural effusions. Electronically Signed   By: Van Clines M.D.   On: 09/27/2017 23:00   Dg Chest 1 View  Result Date: 09/27/2017 CLINICAL DATA:  Status post right thoracentesis today. EXAM: CHEST  1 VIEW COMPARISON:  Single-view of the chest earlier today. CT chest 09/26/2017. FINDINGS: Right pleural effusion is decreased after thoracentesis. No pneumothorax. No other change. IMPRESSION: Decreased right pleural effusion after thoracentesis. Negative for pneumothorax. Persistent right basilar opacity consistent with residual pleural fluid and pulmonary mass. Electronically Signed   By: Inge Rise M.D.   On: 09/27/2017 12:03   Dg Chest 2 View  Result Date: 09/27/2017 CLINICAL DATA:  Worsening shortness of breath  and bilateral lower extremity swelling over the past several days. EXAM: CHEST - 2 VIEW COMPARISON:  CT chest 09/26/2017. FINDINGS: Right pleural effusion and dense opacity in the right lower chest consistent with mass seen on the prior CT is unchanged. A very small left pleural effusion and mild basilar atelectasis noted. Heart size is enlarged. No pneumothorax. No acute bony abnormality. IMPRESSION: No change in a right pleural effusion and right lower lobe opacity consistent with the mass lesion seen on prior CT. Cardiomegaly without edema. Tiny left pleural effusion and mild basilar atelectasis. Electronically Signed   By: Inge Rise M.D.   On: 09/27/2017 10:58   Ct Chest Wo Contrast  Result Date: 09/26/2017 CLINICAL DATA:  Dyspnea, peripheral edema, symptoms worsening over last week, lung nodule, lung mass  EXAM: CT CHEST WITHOUT CONTRAST TECHNIQUE: Multidetector CT imaging of the chest was performed following the standard protocol without IV contrast. COMPARISON:  None FINDINGS: Cardiovascular: No significant vascular findings. Normal heart size. No pericardial effusion. Mediastinum/Nodes: Prominent mediastinal lymph nodes are mildly enlarged. For example RIGHT lower paratracheal lymph node measuring 13 mm short axis. RIGHT hilar lymph node measuring 12 mm short axis. Lungs/Pleura: Consolidative mass in the RIGHT lower lobe with central and peripheral calcifications is poorly marginated. Mass measures approximately 8.5 by 10 by 9.0 cm and occupies the majority of the RIGHT lower lobe. There is there is airspace disease in the medial RIGHT lower lobe with air bronchograms. Small effusion. Within the RIGHT upper lobe 6 mm peripheral nodule image 62/4. Small LEFT effusion. Upper Abdomen: Limited view of the liver, kidneys, pancreas are unremarkable. Normal adrenal glands. Musculoskeletal: Bulky osteophytes of the spine. No aggressive osseous lesion. IMPRESSION: 1. Large RIGHT lower lobe mass with central  and peripheral calcifications occupies the near entirety of the RIGHT lower lobe and concerning for neoplasm. 2. Moderate RIGHT effusion. 3. Mild mediastinal and RIGHT hilar adenopathy. 4. Recommend FDG PET scan for biopsy planning/staging These results will be called to the ordering clinician or representative by the Radiologist Assistant, and communication documented in the PACS or zVision Dashboard. Electronically Signed   By: Suzy Bouchard M.D.   On: 09/26/2017 10:43   Ir Thoracentesis Asp Pleural Space W/img Guide  Result Date: 09/27/2017 INDICATION: Symptomatic right pleural effusion - request for diagnostic thoracentesis EXAM: ULTRASOUND GUIDED RIGHT THORACENTESIS MEDICATIONS: 10 mL 2 % Lidocaine. COMPLICATIONS: None immediate. PROCEDURE: An ultrasound guided thoracentesis was thoroughly discussed with the patient and questions answered. The benefits, risks, alternatives and complications were also discussed. The patient understands and wishes to proceed with the procedure. Written consent was obtained. Ultrasound was performed to localize and mark an adequate pocket of fluid in the right chest. The area was then prepped and draped in the normal sterile fashion. 2% Lidocaine was used for local anesthesia. Under ultrasound guidance a 6 Fr Safe-T-Centesis catheter was introduced. Thoracentesis was performed. The catheter was removed and a dressing applied. FINDINGS: A total of approximately 700 mL of golden yellow fluid was removed. Samples were sent to the laboratory as requested by the clinical team. IMPRESSION: Successful ultrasound guided right thoracentesis yielding 700 mL of pleural fluid. Read by Candiss Norse, PA-C Electronically Signed   By: Jacqulynn Cadet M.D.   On: 09/27/2017 11:27    Labs:  CBC: Recent Labs    09/26/17 0955 09/27/17 0242 09/28/17 0217 09/29/17 0434  WBC 8.4 7.3 7.7 6.7  HGB 11.4* 12.0* 11.0* 11.1*  HCT 34.0* 37.6* 33.6* 34.1*  PLT 336 256 250 262     COAGS: Recent Labs    09/26/17 1102 09/26/17 1559 09/29/17 0434  INR 1.32 1.35 1.28  APTT 31 71*  --     BMP: Recent Labs    09/26/17 1102 09/26/17 1559 09/27/17 0242 09/29/17 0434  NA 138 138 137 138  K 4.8 4.3 4.7 3.8  CL 98 98 100 96*  CO2 32 30 27 32  GLUCOSE 115* 117* 109* 95  BUN 17 16 17 14   CALCIUM 8.8* 8.5* 8.3* 8.3*  CREATININE 1.22 1.10 0.95 1.07  GFRNONAA 60* >60 >60 >60  GFRAA >60 >60 >60 >60    LIVER FUNCTION TESTS: Recent Labs    02/07/17 0904 09/26/17 1102 09/26/17 1559  BILITOT 0.5 0.8 0.8  AST 27 35 32  ALT  33 45* 46*  ALKPHOS 63 94 89  PROT 7.1 7.3 7.2  ALBUMIN 4.5 2.6* 2.7*    TUMOR MARKERS: No results for input(s): AFPTM, CEA, CA199, CHROMGRNA in the last 8760 hours.  Assessment and Plan:  Hx prostate cancer New SOB and cough Work up reveals multiple masses in lungs and abd Request for liver lesion biopsy For TEE today Will schedule liver lesion biopsy for 7/22 Mon Pt is aware Risks and benefits discussed with the patient including, but not limited to bleeding, infection, damage to adjacent structures or low yield requiring additional tests.  All of the patient's questions were answered, patient is agreeable to proceed. Consent signed and in chart.  Thank you for this interesting consult.  I greatly enjoyed meeting Logan [name] and look forward to participating in their care.  A copy of this report was sent to the requesting provider on this date.  Electronically Signed: Lavonia Drafts, PA-C 09/29/2017, 7:55 AM   I spent a total of 40 Minutes    in face to face in clinical consultation, greater than 50% of which was counseling/coordinating care for liver lesion biopsy

## 2017-09-29 NOTE — CV Procedure (Signed)
TEE/CARDIOVERSION NOTE  TRANSESOPHAGEAL ECHOCARDIOGRAM (TEE):  Indictation: Atrial Fibrillation  Consent:   Informed consent was obtained prior to the procedure. The risks, benefits and alternatives for the procedure were discussed and the patient comprehended these risks.  Risks include, but are not limited to, cough, sore throat, vomiting, nausea, somnolence, esophageal and stomach trauma or perforation, bleeding, low blood pressure, aspiration, pneumonia, infection, trauma to the teeth and death.    Time Out: Verified patient identification, verified procedure, site/side was marked, verified correct patient position, special equipment/implants available, medications/allergies/relevent history reviewed, required imaging and test results available. Performed  Procedure:  After a procedural time-out, the patient was given propofol per anesthesia for moderate sedation. The patient's heart rate, blood pressure, and oxygen saturation are monitored continuously during the procedure. The oropharynx was anesthetized 10 cc of topical 1% viscous lidocaine and 2 cetacaine sprays.  The transesophageal probe was inserted in the esophagus and stomach without difficulty and multiple views were obtained. Agitated microbubble saline contrast was not administered.  Complications:    Complications: None Patient did tolerate procedure well.  Findings:  1. LEFT VENTRICLE: The left ventricular wall thickness is mildly increased.  The left ventricular cavity is dilated in size. Wall motion is severely hypokinetic.  LVEF is 15%.  2. RIGHT VENTRICLE:  The right ventricle is dilated with moderate to severe hypokinesis.  3. LEFT ATRIUM:  The left atrium is severely dilated in size without any thrombus or masses.  There is spontaneous echo contrast ("smoke") in the left atrium consistent with a low flow state.  4. LEFT ATRIAL APPENDAGE:  The left atrial appendage is free of any thrombus or masses. The  appendage has single lobes. Pulse doppler indicates moderate flow in the appendage.  5. ATRIAL SEPTUM:  The atrial septum appears intact and is free of thrombus and/or masses.  There is no evidence for interatrial shunting by color doppler and saline microbubble.  6. RIGHT ATRIUM:  The right atrium is normal is dilated without any thrombus or masses.  7. MITRAL VALVE:  The mitral valve is normal in structure and function with Moderate regurgitation.  There were no vegetations or stenosis.  8. AORTIC VALVE:  The aortic valve is trileaflet, normal in structure and function with no regurgitation.  There were no vegetations or stenosis  9. TRICUSPID VALVE:  The tricuspid valve is normal in structure and function with Mild regurgitation.  There were no vegetations or stenosis  10.  PULMONIC VALVE:  The pulmonic valve is normal in structure and function with Mild regurgitation.  There were no vegetations or stenosis.   11. AORTIC ARCH, ASCENDING AND DESCENDING AORTA:  There was no Ron Parker et. Al, 1992) atherosclerosis of the ascending aorta, aortic arch, or proximal descending aorta.  12. PULMONARY VEINS: Anomalous pulmonary venous return was not noted.  13. PERICARDIUM: The pericardium appeared normal and non-thickened.  There is no pericardial effusion.  CARDIOVERSION:     Second Time Out: Verified patient identification, verified procedure, site/side was marked, verified correct patient position, special equipment/implants available, medications/allergies/relevent history reviewed, required imaging and test results available.  Performed  Procedure:  1. Patient placed on cardiac monitor, pulse oximetry, supplemental oxygen as necessary.  2. Sedation administered per anesthesia 3. Pacer pads placed anterior and posterior chest. 4. Cardioverted 3 time(s).  5. Cardioverted at 200J x 3 biphasic. NSR was noted for a few beats and rapidly degenerated back to  a-fib.  Complications:  Complications: None Patient did tolerate procedure well.  Impression:  1. No LAA thrombus 2. Dilated cardiomyopathy with LVEF 15% 3. Ultimately unsuccessful DCCV with brief conversion to sinus for a few beats, then rhythm degenerated back to a-fib  Recommendations:  1. Continue amiodarone loading - will transition to oral tomorrow. Keep on IV heparin until after IR biopsy on Monday and then transition to Eliquis. Monitor for ongoing hemoptysis.  Time Spent Directly with the Patient:  45 minutes   Pixie Casino, MD, [facility], [place] Director of the [facility] Lipid Disorders &  Cardiovascular Risk Reduction Clinic Diplomate of the American Board of Clinical Lipidology Attending Cardiologist  Direct Dial: [phone]  Fax: [phone]  Website:  [name] 09/29/2017, 12:52 PM

## 2017-09-30 LAB — BASIC METABOLIC PANEL
Anion gap: 8 (ref 5–15)
BUN: 16 mg/dL (ref 8–23)
CHLORIDE: 97 mmol/L — AB (ref 98–111)
CO2: 32 mmol/L (ref 22–32)
CREATININE: 1.08 mg/dL (ref 0.61–1.24)
Calcium: 8.3 mg/dL — ABNORMAL LOW (ref 8.9–10.3)
GFR calc non Af Amer: >60 mL/min (ref >60)
Glucose, Bld: 105 mg/dL — ABNORMAL HIGH (ref 70–99)
Potassium: 3.9 mmol/L (ref 3.5–5.1)
Sodium: 137 mmol/L (ref 135–145)

## 2017-09-30 LAB — CBC
HEMATOCRIT: 33.4 % — AB (ref 39.0–52.0)
HEMOGLOBIN: 10.9 g/dL — AB (ref 13.0–17.0)
MCH: 29.5 pg (ref 26.0–34.0)
MCHC: 32.6 g/dL (ref 30.0–36.0)
MCV: 90.5 fL (ref 78.0–100.0)
Platelets: 233 10*3/uL (ref 150–400)
RBC: 3.69 MIL/uL — ABNORMAL LOW (ref 4.22–5.81)
RDW: 13.9 % (ref 11.5–15.5)
WBC: 6.7 10*3/uL (ref 4.0–10.5)

## 2017-09-30 LAB — HEPARIN LEVEL (UNFRACTIONATED): HEPARIN UNFRACTIONATED: 0.62 [IU]/mL (ref 0.30–0.70)

## 2017-09-30 NOTE — Progress Notes (Signed)
Subjective:  He had an unsuccessful TEE cardioversion yesterday.  No complaints of shortness of breath today.  Awaiting biopsy on Monday.  Pleural fluid cytology reviewed with wife  Objective:  Vital Signs in the last 24 hours: BP 90/64   Pulse (!) 107   Temp 98.2 F (36.8 C) (Oral)   Resp 18   Ht 5\' 10"  (1.778 m)   Wt 108.1 kg (238 lb 4.8 oz)   SpO2 95%   BMI 34.19 kg/m   Physical Exam: Pleasant black male in no acute distress Lungs:  Clear  Cardiac:  irregular rhythm, normal S1 and S2, no S3 Abdomen:  Soft, nontender, no masses Extremities:  No edema present  Intake/Output from previous day: 07/19 0701 - 07/20 0700 In: 1014.5 [P.O.:600; I.V.:414.5] Out: 2252 [Urine:2250; Blood:2] Weight Filed Weights   09/28/17 0553 09/29/17 0504 09/30/17 0535  Weight: 114.2 kg (251 lb 12.3 oz) 117.9 kg (259 lb 14.8 oz) 108.1 kg (238 lb 4.8 oz)    Lab Results: Basic Metabolic Panel: Recent Labs    09/29/17 0434 09/30/17 0341  NA 138 137  K 3.8 3.9  CL 96* 97*  CO2 32 32  GLUCOSE 95 105*  BUN 14 16  CREATININE 1.07 1.08    CBC: Recent Labs    09/29/17 0434 09/30/17 0341  WBC 6.7 6.7  HGB 11.1* 10.9*  HCT 34.1* 33.4*  MCV 90.5 90.5  PLT 262 233    BNP    Component Value Date/Time   BNP 537.4 (H) 09/26/2017 0955   Telemetry: atrial fibrillation controlled response this morning  Assessment/Plan:  1.  Chronic systolic heart failure 2.Cardiomyopathy 3.  Persistent atrial fibrillation with failure to maintain sinus rhythm following cardioversion 4.  Lung Mass.  Recommendations:   to have a biopsy on Monday.  Blood pressure has been borderline and Will continue amiodarone at this time.  Continue heparin.       Kerry Hough  MD [facility] Card[facility]  09/30/2017, 9:42 AM

## 2017-09-30 NOTE — Progress Notes (Signed)
[facility] for Heparin Indication: atrial fibrillation  No Known Allergies  Patient Measurements: Height: 5\' 10"  (177.8 cm) Weight: 238 lb 4.8 oz (108.1 kg) IBW/kg (Calculated) : 73 Heparin Dosing Weight: 97kg  Vital Signs: Temp: 98.2 F (36.8 C) (07/20 0535) Temp Source: Oral (07/20 0535) BP: 90/64 (07/20 0836) Pulse Rate: 107 (07/20 0836)  Labs: Recent Labs    09/28/17 0217 09/29/17 0434 09/30/17 0341  HGB 11.0* 11.1* 10.9*  HCT 33.6* 34.1* 33.4*  PLT 250 262 233  LABPROT  --  15.9*  --   INR  --  1.28  --   HEPARINUNFRC 0.52 0.44 0.62  CREATININE  --  1.07 1.08    Estimated Creatinine Clearance: 81.7 mL/min (by C-G formula based on SCr of 1.08 mg/dL).   Medical History: Past Medical History:  Diagnosis Date  . CHF (congestive heart failure) ([place])   . Chronic systolic heart failure ([place]) 04/15/2015    EF 30-35% by echo 2016 , [facility]   . Hypertension   . Lung mass 04/15/2015    Never biopsied. History of PET scan that raised concern.   . Metabolic syndrome 11/18/5298  . OSA on CPAP   . Paroxysmal atrial fibrillation ([place]) 04/15/2015   On apixaban   . Prostate cancer ([place])   . Thrombocytopenia ([place]) 04/15/2015    Medications:  Infusions:  . sodium chloride    . heparin 1,850 Units/hr (09/30/17 0120)    Assessment: 4 yom presented to the ED with afib with RVR from cardiologist office. Pharmacy consulted to dose heparin. He has been on xarelto in the past but stopped taking with ongoing hemoptysis.  Cardioversion 7/19 unsuccessful, pt remains in a-fib. Patient to remain on heparin gtt until after planned liver biopsy on 7/22.  CBC stable. No bleed documented.  Heparin level today therapeutic at 0.62  Goal of Therapy:  Heparin level 0.3-0.7 units/ml Monitor platelets by anticoagulation protocol: Yes   Plan:  Continue IV Heparin 1850 units/hr Monitor daily heparin level and CBC, s/sx bleeding Planning liver bx  7/22  Janae Bridgeman, PharmD PGY1 Pharmacy Resident Phone: [phone] 09/30/2017 10:50 AM

## 2017-10-01 LAB — BASIC METABOLIC PANEL
ANION GAP: 9 (ref 5–15)
BUN: 17 mg/dL (ref 8–23)
CALCIUM: 8.6 mg/dL — AB (ref 8.9–10.3)
CO2: 33 mmol/L — ABNORMAL HIGH (ref 22–32)
Chloride: 97 mmol/L — ABNORMAL LOW (ref 98–111)
Creatinine, Ser: 1.21 mg/dL (ref 0.61–1.24)
GFR calc Af Amer: >60 mL/min (ref >60)
Glucose, Bld: 102 mg/dL — ABNORMAL HIGH (ref 70–99)
POTASSIUM: 3.9 mmol/L (ref 3.5–5.1)
SODIUM: 139 mmol/L (ref 135–145)

## 2017-10-01 LAB — CBC
HCT: 32.3 % — ABNORMAL LOW (ref 39.0–52.0)
Hemoglobin: 10.7 g/dL — ABNORMAL LOW (ref 13.0–17.0)
MCH: 29.9 pg (ref 26.0–34.0)
MCHC: 33.1 g/dL (ref 30.0–36.0)
MCV: 90.2 fL (ref 78.0–100.0)
Platelets: 237 10*3/uL (ref 150–400)
RBC: 3.58 MIL/uL — AB (ref 4.22–5.81)
RDW: 14 % (ref 11.5–15.5)
WBC: 7.4 10*3/uL (ref 4.0–10.5)

## 2017-10-01 LAB — HEPARIN LEVEL (UNFRACTIONATED): HEPARIN UNFRACTIONATED: 0.32 [IU]/mL (ref 0.30–0.70)

## 2017-10-01 NOTE — Progress Notes (Signed)
Patient has home cpap and places on himself without assistance. RT will monitor as needed.

## 2017-10-01 NOTE — Progress Notes (Signed)
ANTICOAGULATION CONSULT NOTE  Pharmacy Consult for Heparin Indication: atrial fibrillation  No Known Allergies  Patient Measurements: Height: 5\' 10"  (177.8 cm) Weight: 237 lb 8 oz (107.7 kg) IBW/kg (Calculated) : 73 Heparin Dosing Weight: 97kg  Vital Signs: Temp: 98.4 F (36.9 C) (07/21 0508) Temp Source: Oral (07/21 0508) BP: 122/64 (07/21 0508) Pulse Rate: 120 (07/21 0508)  Labs: Recent Labs    09/29/17 0434 09/30/17 0341 10/01/17 0307  HGB 11.1* 10.9* 10.7*  HCT 34.1* 33.4* 32.3*  PLT 262 233 237  LABPROT 15.9*  --   --   INR 1.28  --   --   HEPARINUNFRC 0.44 0.62 0.32  CREATININE 1.07 1.08 1.21    Estimated Creatinine Clearance: 72.8 mL/min (by C-G formula based on SCr of 1.21 mg/dL).   Medical History: Past Medical History:  Diagnosis Date  . CHF (congestive heart failure) ([place])   . Chronic systolic heart failure ([place]) 04/15/2015    EF 30-35% by echo 2016 , [facility]   . Hypertension   . Lung mass 04/15/2015    Never biopsied. History of PET scan that raised concern.   . Metabolic syndrome 0/05/7541  . OSA on CPAP   . Paroxysmal atrial fibrillation ([place]) 04/15/2015   On apixaban   . Prostate cancer ([place])   . Thrombocytopenia ([place]) 04/15/2015    Medications:  Infusions:  . sodium chloride    . heparin 1,850 Units/hr (10/01/17 6067)    Assessment: Logan [name] presented to the ED with afib with RVR from cardiologist office. Pharmacy consulted to dose heparin. He has been on xarelto in the past but stopped taking with ongoing hemoptysis.  Cardioversion 7/19 unsuccessful, pt remains in a-fib. Patient to remain on heparin gtt until after planned liver biopsy on 7/22.  CBC stable. No bleed documented.  Heparin level today therapeutic at 0.32  Goal of Therapy:  Heparin level 0.3-0.7 units/ml Monitor platelets by anticoagulation protocol: Yes   Plan:  Continue IV Heparin 1850 units/hr Monitor daily heparin level and CBC, s/sx bleeding  Thank you for involving  pharmacy in this patient's care.  Janae Bridgeman, PharmD PGY1 Pharmacy Resident Phone: [phone] 10/01/2017 11:30 AM

## 2017-10-01 NOTE — Progress Notes (Signed)
Patient c/o congested, productive cough.  Secretions have become less as night went on.

## 2017-10-01 NOTE — Progress Notes (Signed)
Subjective:  Says that he feels better today.  Currently not short of breath.  Atrial fibrillation rate mildly elevated.  Says his breathing is better since he had his thoracentesis.  Awaiting biopsy on Monday.  Objective:  Vital Signs in the last 24 hours: BP 122/64 (BP Location: Right Arm)   Pulse (!) 120   Temp 98.4 F (36.9 C) (Oral)   Resp (!) 24   Ht 5\' 10"  (1.778 m)   Wt 107.7 kg (237 lb 8 oz)   SpO2 92%   BMI 34.08 kg/m   Physical Exam: Pleasant black male in no acute distress Lungs:  Clear  Cardiac:  irregular rhythm, normal S1 and S2, no S3 Extremities:  No edema present  Intake/Output from previous day: 07/20 0701 - 07/21 0700 In: 814 [P.O.:420; I.V.:394] Out: 1475 [Urine:1475] Weight Filed Weights   09/29/17 0504 09/30/17 0535 10/01/17 0508  Weight: 117.9 kg (259 lb 14.8 oz) 108.1 kg (238 lb 4.8 oz) 107.7 kg (237 lb 8 oz)    Lab Results: Basic Metabolic Panel: Recent Labs    09/30/17 0341 10/01/17 0307  NA 137 139  K 3.9 3.9  CL 97* 97*  CO2 32 33*  GLUCOSE 105* 102*  BUN 16 17  CREATININE 1.08 1.21    CBC: Recent Labs    09/30/17 0341 10/01/17 0307  WBC 6.7 7.4  HGB 10.9* 10.7*  HCT 33.4* 32.3*  MCV 90.5 90.2  PLT 233 237    BNP    Component Value Date/Time   BNP 537.4 (H) 09/26/2017 0955   Telemetry: atrial fibrillation mildly elevated rate this morning around 110  Assessment/Plan:  1.  Chronic systolic heart failure 2. Cardiomyopathy 3.  Persistent atrial fibrillation with failure to maintain sinus rhythm following cardioversion 4.  Lung Mass.  Recommendations:  Atrial fibrillation rate mildly increased this morning.  Continue amiodarone.  Awaiting results of biopsy in the morning.      Kerry Hough  MD [facility]  10/01/2017, 10:29 AM

## 2017-10-02 ENCOUNTER — Encounter (HOSPITAL_COMMUNITY): Payer: Self-pay | Admitting: Internal Medicine

## 2017-10-02 ENCOUNTER — Inpatient Hospital Stay (HOSPITAL_COMMUNITY): Payer: 59

## 2017-10-02 DIAGNOSIS — K769 Liver disease, unspecified: Secondary | ICD-10-CM

## 2017-10-02 LAB — CBC
HCT: 32.4 % — ABNORMAL LOW (ref 39.0–52.0)
Hemoglobin: 10.7 g/dL — ABNORMAL LOW (ref 13.0–17.0)
MCH: 29.8 pg (ref 26.0–34.0)
MCHC: 33 g/dL (ref 30.0–36.0)
MCV: 90.3 fL (ref 78.0–100.0)
PLATELETS: 234 10*3/uL (ref 150–400)
RBC: 3.59 MIL/uL — ABNORMAL LOW (ref 4.22–5.81)
RDW: 14.1 % (ref 11.5–15.5)
WBC: 6.6 10*3/uL (ref 4.0–10.5)

## 2017-10-02 LAB — HEPARIN LEVEL (UNFRACTIONATED): Heparin Unfractionated: 0.41 IU/mL (ref 0.30–0.70)

## 2017-10-02 LAB — BASIC METABOLIC PANEL
Anion gap: 10 (ref 5–15)
BUN: 13 mg/dL (ref 8–23)
CALCIUM: 8.8 mg/dL — AB (ref 8.9–10.3)
CO2: 34 mmol/L — ABNORMAL HIGH (ref 22–32)
Chloride: 97 mmol/L — ABNORMAL LOW (ref 98–111)
Creatinine, Ser: 1.07 mg/dL (ref 0.61–1.24)
GFR calc Af Amer: >60 mL/min (ref >60)
GLUCOSE: 106 mg/dL — AB (ref 70–99)
Potassium: 3.5 mmol/L (ref 3.5–5.1)
Sodium: 141 mmol/L (ref 135–145)

## 2017-10-02 MED ORDER — HEPARIN (PORCINE) IN NACL 100-0.45 UNIT/ML-% IJ SOLN
1850.0000 [IU]/h | INTRAMUSCULAR | Status: DC
  Administered 2017-10-02 – 2017-10-05 (×6): 1850 [IU]/h via INTRAVENOUS
  Filled 2017-10-02 (×6): qty 250

## 2017-10-02 MED ORDER — MIDAZOLAM HCL 2 MG/2ML IJ SOLN
INTRAMUSCULAR | Status: AC
  Administered 2017-10-03: 2 mg via INTRAVENOUS
  Filled 2017-10-02: qty 2

## 2017-10-02 MED ORDER — FENTANYL CITRATE (PF) 100 MCG/2ML IJ SOLN
INTRAMUSCULAR | Status: AC
  Filled 2017-10-02: qty 2

## 2017-10-02 MED ORDER — FENTANYL CITRATE (PF) 100 MCG/2ML IJ SOLN
INTRAMUSCULAR | Status: AC | PRN
  Administered 2017-10-02: 50 ug via INTRAVENOUS

## 2017-10-02 MED ORDER — LIDOCAINE HCL (PF) 1 % IJ SOLN
INTRAMUSCULAR | Status: AC
  Filled 2017-10-02: qty 30

## 2017-10-02 MED ORDER — MIDAZOLAM HCL 2 MG/2ML IJ SOLN
INTRAMUSCULAR | Status: AC | PRN
  Administered 2017-10-02: 1 mg via INTRAVENOUS

## 2017-10-02 NOTE — Progress Notes (Signed)
[place] for Heparin Indication: atrial fibrillation  No Known Allergies  Patient Measurements: Height: 5\' 10"  (177.8 cm) Weight: 233 lb 11.2 oz (106 kg) IBW/kg (Calculated) : 73 Heparin Dosing Weight: 97kg  Vital Signs: Temp: 97.8 F (36.6 C) (07/22 0402) Temp Source: Oral (07/22 0402) BP: 95/79 (07/22 0402) Pulse Rate: 116 (07/22 0402)  Labs: Recent Labs    09/30/17 0341 10/01/17 0307 10/02/17 0453  HGB 10.9* 10.7* 10.7*  HCT 33.4* 32.3* 32.4*  PLT 233 237 234  HEPARINUNFRC 0.62 0.32 0.41  CREATININE 1.08 1.21 1.07    Estimated Creatinine Clearance: 81.7 mL/min (by C-G formula based on SCr of 1.07 mg/dL).   Medical History: Past Medical History:  Diagnosis Date  . CHF (congestive heart failure) ([place])   . Chronic systolic heart failure ([place]) 04/15/2015    EF 30-35% by echo 2016 , [facility]   . Hypertension   . Lung mass 04/15/2015    Never biopsied. History of PET scan that raised concern.   . Metabolic syndrome 04/20/1290  . OSA on CPAP   . Paroxysmal atrial fibrillation ([place]) 04/15/2015   On apixaban   . Prostate cancer ([place])   . Thrombocytopenia ([place]) 04/15/2015    Medications:  Infusions:  . sodium chloride    . heparin 1,850 Units/hr (10/01/17 2140)    Assessment: 19 yom presented to the ED with afib with RVR from cardiologist office. Pharmacy consulted to dose heparin. He has been on xarelto in the past but stopped taking with ongoing hemoptysis.  Cardioversion 7/19 unsuccessful, pt remains in a-fib. Patient to remain on heparin gtt until after planned liver biopsy on 7/22.  CBC low stable. No bleeding per RN.  Heparin level today therapeutic at 0.41. Lung biopsy planned today.   Goal of Therapy:  Heparin level 0.3-0.7 units/ml Monitor platelets by anticoagulation protocol: Yes   Plan:  Heparin drip held this AM for Lung biopsy.  Monitor daily heparin level and CBC, s/sx bleeding F/u [facility] after biopsy today    Thank you for involving pharmacy in this patient's care.  Albertina Parr, PharmD., BCPS Clinical Pharmacist Clinical phone for 10/02/17 until 3:30pm: [phone] If after 3:30pm, please refer to [facility] for unit-specific pharmacist

## 2017-10-02 NOTE — Progress Notes (Signed)
[facility] PCCM  Plan for IR guided liver biopsy.  Will follow up results.  Roselie Awkward, MD [place] P[facility]M Pager: [phone] Cell: [phone] After 3pm or if no response, call [phone]

## 2017-10-02 NOTE — Progress Notes (Signed)
Patient states that he will wear his home unit CPAP tonight once he is ready for bed. No help or assistance is needed. Wife is at the bedside.

## 2017-10-02 NOTE — Progress Notes (Signed)
ANTICOAGULATION CONSULT NOTE - Follow-Up  Pharmacy Consult for Heparin Indication: atrial fibrillation  No Known Allergies  Patient Measurements: Height: 5\' 10"  (177.8 cm) Weight: 233 lb 11.2 oz (106 kg) IBW/kg (Calculated) : 73 Heparin Dosing Weight: 97kg  Vital Signs: Temp: 97.8 F (36.6 C) (07/22 0402) Temp Source: Oral (07/22 0402) BP: 107/80 (07/22 1400) Pulse Rate: 111 (07/22 1400)  Labs: Recent Labs    09/30/17 0341 10/01/17 0307 10/02/17 0453  HGB 10.9* 10.7* 10.7*  HCT 33.4* 32.3* 32.4*  PLT 233 237 234  HEPARINUNFRC 0.62 0.32 0.41  CREATININE 1.08 1.21 1.07    Estimated Creatinine Clearance: 81.7 mL/min (by C-G formula based on SCr of 1.07 mg/dL).   Medical History: Past Medical History:  Diagnosis Date  . CHF (congestive heart failure) ([place])   . Chronic systolic heart failure ([place]) 04/15/2015    EF 30-35% by echo 2016 , [facility]   . Hypertension   . Lung mass 04/15/2015    Never biopsied. History of PET scan that raised concern.   . Metabolic syndrome 0/05/7541  . OSA on CPAP   . Paroxysmal atrial fibrillation ([place]) 04/15/2015   On apixaban   . Prostate cancer ([place])   . Thrombocytopenia ([place]) 04/15/2015    Medications:  Infusions:  . sodium chloride    . heparin Stopped (10/02/17 6067)    Assessment: 54 yom presented to the ED with afib with RVR from cardiologist office. Pharmacy consulted to dose heparin. He has been on xarelto in the past but stopped taking with ongoing hemoptysis.  Cardioversion 7/19 unsuccessful, pt remains in a-fib. Patient s/p liver biopsy with orders to restart heparin 3 hours after procedure.  Will start at previously therapeutic rate.    CBC low stable. No bleeding per RN.  Heparin level today therapeutic at 0.41 on 1850 units/hr.   Goal of Therapy:  Heparin level 0.3-0.7 units/ml Monitor platelets by anticoagulation protocol: Yes   Plan:  Restart heparin at 5pm.  Start at 1850 units/hr. Next heparin level with AM  labs. Monitor daily heparin level and CBC, s/sx bleeding  Thank you for involving pharmacy in this patient's care.  [employer], Pharm.D., BCPS Clinical Pharmacist Pager: [phone] Clinical phone for 10/02/2017 is [phone].  **Pharmacist phone directory can now be found on [email or web address] ([email or web address]).  Listed under [place].  10/02/2017 2:47 PM

## 2017-10-02 NOTE — Procedures (Signed)
US guided core biopsy (4 cores) of lesion in medial left hepatic lobe.  Minimal blood loss.  No immediate complication.  Biopsy tract was embolized with Gelfoam slurry.  Bedrest 3 hours and may restart heparin in 3 hours.

## 2017-10-02 NOTE — Progress Notes (Addendum)
Progress Note  Patient Name: Logan [name] Date of Encounter: 10/02/2017  Primary Cardiologist: Sinclair Grooms, MD  Subjective   Feeling fine. No CP, SOB, palpitations. HR remains 100-120s. They just received word that liver bx scheduled for noon. Wife very engaged in care, taking notes.  Inpatient Medications    Scheduled Meds: . amiodarone  400 mg Oral BID  . aspirin EC  81 mg Oral Daily  . carvedilol  3.125 mg Oral BID WC  . furosemide  60 mg Intravenous TID  . sodium chloride flush  3 mL Intravenous Q12H   Continuous Infusions: . sodium chloride    . heparin Stopped (10/02/17 2536)   PRN Meds: sodium chloride, acetaminophen, lidocaine, ondansetron (ZOFRAN) IV, sodium chloride flush   Vital Signs    Vitals:   10/01/17 2110 10/01/17 2235 10/02/17 0402 10/02/17 0903  BP: (!) 85/74 (!) 88/65 95/79 97/74   Pulse: (!) 118  (!) 116 (!) 104  Resp: 20 20 (!) 25   Temp: 98.1 F (36.7 C)  97.8 F (36.6 C)   TempSrc: Oral  Oral   SpO2:  91% 96%   Weight:   233 lb 11.2 oz (106 kg)   Height:        Intake/Output Summary (Last 24 hours) at 10/02/2017 0951 Last data filed at 10/02/2017 0850 Gross per 24 hour  Intake 533.01 ml  Output 2200 ml  Net -1666.99 ml   Filed Weights   09/30/17 0535 10/01/17 0508 10/02/17 0402  Weight: 238 lb 4.8 oz (108.1 kg) 237 lb 8 oz (107.7 kg) 233 lb 11.2 oz (106 kg)    Telemetry    Atrial fib HR 100-120s - Personally Reviewed  Physical Exam   GEN: No acute distress.  HEENT: Normocephalic, atraumatic, sclera non-icteric. Neck: No JVD or bruits. Cardiac: Irregularly irregular, tachycardic, no murmurs, rubs, or gallops.  Radials/DP/PT 1+ and equal bilaterally.  Respiratory: No BS R base, clear L base. Breathing is unlabored. GI: Soft, nontender, non-distended, BS +x 4. MS: no deformity. Extremities: No clubbing or cyanosis. Trace pedal edema with compression hose.  Neuro:  AAOx3. Follows commands. Psych:  Responds to  questions appropriately with a normal affect.  Labs    Chemistry Recent Labs  Lab 09/26/17 1102 09/26/17 1559  09/30/17 0341 10/01/17 0307 10/02/17 0453  NA 138 138   < > 137 139 141  K 4.8 4.3   < > 3.9 3.9 3.5  CL 98 98   < > 97* 97* 97*  CO2 32 30   < > 32 33* 34*  GLUCOSE 115* 117*   < > 105* 102* 106*  BUN 17 16   < > 16 17 13   CREATININE 1.22 1.10   < > 1.08 1.21 1.07  CALCIUM 8.8* 8.5*   < > 8.3* 8.6* 8.8*  PROT 7.3 7.2  --   --   --   --   ALBUMIN 2.6* 2.7*  --   --   --   --   AST 35 32  --   --   --   --   ALT 45* 46*  --   --   --   --   ALKPHOS 94 89  --   --   --   --   BILITOT 0.8 0.8  --   --   --   --   GFRNONAA 60* >60   < > >60 >60 >60  GFRAA >60 >60   < > >  60 >60 >60  ANIONGAP 8 10   < > 8 9 10    < > = values in this interval not displayed.     Hematology Recent Labs  Lab 09/30/17 0341 10/01/17 0307 10/02/17 0453  WBC 6.7 7.4 6.6  RBC 3.69* 3.58* 3.59*  HGB 10.9* 10.7* 10.7*  HCT 33.4* 32.3* 32.4*  MCV 90.5 90.2 90.3  MCH 29.5 29.9 29.8  MCHC 32.6 33.1 33.0  RDW 13.9 14.0 14.1  PLT 233 237 234    Cardiac Enzymes Recent Labs  Lab 09/26/17 1102 09/26/17 1559 09/26/17 2131  TROPONINI 0.04* 0.03* <0.03   No results for input(s): TROPIPOC in the last 168 hours.   BNP Recent Labs  Lab 09/26/17 0955  BNP 537.4*     DDimer No results for input(s): DDIMER in the last 168 hours.   Radiology    No results found.  Cardiac Studies   2D echo 09/26/17 Study Conclusions - Left ventricle: The cavity size was moderately dilated. Wall   thickness was normal. There was adverse spherical remodeling. The   estimated ejection fraction was in the range of 10% to 15%.   Severe diffuse hypokinesis with no identifiable regional   variations. - Mitral valve: There was mild to moderate regurgitation directed   posteriorly. - Left atrium: The atrium was moderately dilated. - Right ventricle: The cavity size was severely dilated. Systolic    function was severely reduced. - Right atrium: The atrium was moderately dilated. - Pericardium, extracardiac: A small pericardial effusion was   identified along the right atrial free wall. There was no   evidence of hemodynamic compromise.  TEE 09/29/17 Study Conclusions - Left ventricle: Moderately dilated with mild LVH. The estimated   ejection fraction was 15%. Diffuse hypokinesis. - Mitral valve: Dilated annulus. Moderate regurgitation. - Left atrium: The atrium was severely dilated. Smoke was noted. No   evidence of thrombus in the atrial cavity or appendage. - Right atrium: The atrium was dilated. - Atrial septum: No defect or patent foramen ovale was identified. - Pericardium, extracardiac: A trivial pericardial effusion was   identified. Impressions: - No LAA thrombus. Unsuccessful DCCV.  Patient Profile     67 y.o. male with NICM EF 25% with normal cors 04/2015, PAF, prostate CA, hypermetabolic RLL mass and L adrenal nodule on PET 03/2015 (pt declined further workup), OSA, thrombocytopenia. Recently seen at [facility] with bilateral effusions, signed out AMA. Seen in the office 7/16 with worsening SOB, edema, found to be in atrial fib RVR. Previously on Xarelto but stopped it due to hemoptysis (has had for 2.5 yrs). Found to have large RLL mass occupying near entirety of RLL, with mild mediastinal and R hilar lymphadenopathy. Underwent IR thoracentesis 7/17 (reactive mesothelial cells). CT abd/pelvis with large complex masses in RLL and R hepatic liver lobe, nonspecific lesion in spleen/liver - not characteristic for prostate CA (possibly r/t metastatic lung cancer, melanoma, metastatic hepatocellular carcinoma, or a variety of other possibilities), also with moderate prostatomegaly, small inferior pericardial effusion, moderate prostatomegaly. Found to have severe biventricular HF, underwent TEE/DCCV on 7/19 but did not hold sinus rhythm.  Assessment & Plan    1. Recurrent atrial  fibrillation with RVR - unsuccessful TEE/DCCV 7/19, being loaded with amiodarone (on this since 7/19) - will discuss dosing with MD. Need to hold off on retrial of DCCV until all procedures complete - for liver bx today. I suspect atrial fib will remain systemically difficult to control with his underlying oncologic issues.  Continue carvedilol as tolerated.  2. Acute on chronic systolic CHF with severe biventricular failure EF 10-15% -normal cors 2017, EF 25% at that time so suspect this may be worsening failure in the setting of AF RVR. He was hypotensive to the 80s last night. I will hold Entresto 24/26mg  and Lasix 60mg  IV TID pending discussion with MD. Clinically we could probably scale back IV Lasix; weight is now 4lb below his prior baseline. Continue carvedilol as BP allows.  3. Large RLL mass with complex liver mass - wife states they were told IR bx is planned for noon. This seems to be the bigger problem of all of the things he is here for. Pulm, oncology and IR are all on board.  4. Hypotension - as above.  5. Anemia - normocytic. No bleeding. Follow.  For questions or updates, please contact [place] Please consult [email or web address] for contact info under Cardiology/STEMI.  Signed, Charlie Pitter, PA-C 10/02/2017, 9:51 AM    Agree with findings by Melina Copa PA-C  Complex 67 year old moderately overweight African-American male patient of Dr. 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  He was admitted on 09/26/2017 with heart failure.  He underwent thoracentesis of 700 cc of fluid as well as diuresis now -5 L.  He had unsuccessful TEE guided DC cardioversion on Friday.  He is on optimal pharmacologic therapy for his heart failure some of which has been held because of relative hypotension.  The main issue is of a large mass in his liver and lung.  He is scheduled for biopsy today.  He is on IV heparin.  He is not on oral and coagulation because of  prior history of hemoptysis.  We will base further recommendations on results of biopsy  Lorretta Harp, M.D., [place], [facility], [place], [place] [address] [place], [place]  [address]  [phone] 10/02/2017 10:54 AM

## 2017-10-03 ENCOUNTER — Inpatient Hospital Stay (HOSPITAL_COMMUNITY): Payer: 59

## 2017-10-03 ENCOUNTER — Inpatient Hospital Stay (HOSPITAL_COMMUNITY): Payer: 59 | Admitting: Certified Registered"

## 2017-10-03 DIAGNOSIS — I428 Other cardiomyopathies: Secondary | ICD-10-CM

## 2017-10-03 DIAGNOSIS — I5021 Acute systolic (congestive) heart failure: Secondary | ICD-10-CM

## 2017-10-03 DIAGNOSIS — J9622 Acute and chronic respiratory failure with hypercapnia: Secondary | ICD-10-CM

## 2017-10-03 DIAGNOSIS — G9340 Encephalopathy, unspecified: Secondary | ICD-10-CM

## 2017-10-03 DIAGNOSIS — J810 Acute pulmonary edema: Secondary | ICD-10-CM

## 2017-10-03 DIAGNOSIS — N179 Acute kidney failure, unspecified: Secondary | ICD-10-CM

## 2017-10-03 DIAGNOSIS — J9621 Acute and chronic respiratory failure with hypoxia: Secondary | ICD-10-CM

## 2017-10-03 LAB — CBC
HCT: 33.8 % — ABNORMAL LOW (ref 39.0–52.0)
Hemoglobin: 11 g/dL — ABNORMAL LOW (ref 13.0–17.0)
MCH: 29.6 pg (ref 26.0–34.0)
MCHC: 32.5 g/dL (ref 30.0–36.0)
MCV: 91.1 fL (ref 78.0–100.0)
PLATELETS: 234 10*3/uL (ref 150–400)
RBC: 3.71 MIL/uL — ABNORMAL LOW (ref 4.22–5.81)
RDW: 14.3 % (ref 11.5–15.5)
WBC: 8.5 10*3/uL (ref 4.0–10.5)

## 2017-10-03 LAB — BASIC METABOLIC PANEL
ANION GAP: 9 (ref 5–15)
BUN: 12 mg/dL (ref 8–23)
CALCIUM: 8.8 mg/dL — AB (ref 8.9–10.3)
CO2: 31 mmol/L (ref 22–32)
CREATININE: 1.04 mg/dL (ref 0.61–1.24)
Chloride: 99 mmol/L (ref 98–111)
GFR calc Af Amer: >60 mL/min (ref >60)
GFR calc non Af Amer: >60 mL/min (ref >60)
GLUCOSE: 128 mg/dL — AB (ref 70–99)
Potassium: 3.8 mmol/L (ref 3.5–5.1)
Sodium: 139 mmol/L (ref 135–145)

## 2017-10-03 LAB — BLOOD GAS, ARTERIAL
Acid-Base Excess: 1.7 mmol/L (ref 0.0–2.0)
Bicarbonate: 30.8 mmol/L — ABNORMAL HIGH (ref 20.0–28.0)
DRAWN BY: 511471
FIO2: 100
MECHVT: 600 mL
O2 Saturation: 94.1 %
PCO2 ART: 103 mmHg — AB (ref 32.0–48.0)
PEEP: 8 cmH2O
PH ART: 7.103 — AB (ref 7.350–7.450)
PO2 ART: 117 mmHg — AB (ref 83.0–108.0)
Patient temperature: 98.6
RATE: 24 resp/min

## 2017-10-03 LAB — HEPARIN LEVEL (UNFRACTIONATED): Heparin Unfractionated: 0.42 IU/mL (ref 0.30–0.70)

## 2017-10-03 MED ORDER — FUROSEMIDE 10 MG/ML IJ SOLN
INTRAMUSCULAR | Status: AC
  Filled 2017-10-03: qty 4

## 2017-10-03 MED ORDER — MIDAZOLAM HCL 2 MG/2ML IJ SOLN
1.0000 mg | INTRAMUSCULAR | Status: DC | PRN
  Administered 2017-10-04 – 2017-10-05 (×2): 1 mg via INTRAVENOUS
  Filled 2017-10-03 (×4): qty 2

## 2017-10-03 MED ORDER — MIDAZOLAM HCL 2 MG/2ML IJ SOLN
1.0000 mg | INTRAMUSCULAR | Status: AC | PRN
  Administered 2017-10-03: 2 mg via INTRAVENOUS
  Administered 2017-10-04 (×2): 1 mg via INTRAVENOUS
  Filled 2017-10-03: qty 2

## 2017-10-03 MED ORDER — AMIODARONE HCL IN DEXTROSE 360-4.14 MG/200ML-% IV SOLN
30.0000 mg/h | INTRAVENOUS | Status: DC
  Administered 2017-10-04 (×2): 30 mg/h via INTRAVENOUS
  Administered 2017-10-05 (×2): 60 mg/h via INTRAVENOUS
  Administered 2017-10-05: 30 mg/h via INTRAVENOUS
  Administered 2017-10-05: 36 mg/h via INTRAVENOUS
  Administered 2017-10-06: 30 mg/h via INTRAVENOUS
  Administered 2017-10-06: 60 mg/h via INTRAVENOUS
  Administered 2017-10-06 – 2017-10-08 (×5): 30 mg/h via INTRAVENOUS
  Filled 2017-10-03 (×14): qty 200

## 2017-10-03 MED ORDER — METOPROLOL TARTRATE 5 MG/5ML IV SOLN
5.0000 mg | Freq: Once | INTRAVENOUS | Status: AC
  Administered 2017-10-03: 5 mg via INTRAVENOUS
  Filled 2017-10-03: qty 5

## 2017-10-03 MED ORDER — FENTANYL CITRATE (PF) 100 MCG/2ML IJ SOLN: 50.0000 ug | INTRAMUSCULAR | Status: DC | PRN

## 2017-10-03 MED ORDER — CHLORHEXIDINE GLUCONATE 0.12% ORAL RINSE (MEDLINE KIT)
15.0000 mL | Freq: Two times a day (BID) | OROMUCOSAL | Status: DC
  Administered 2017-10-03 – 2017-10-06 (×7): 15 mL via OROMUCOSAL

## 2017-10-03 MED ORDER — AMIODARONE HCL IN DEXTROSE 360-4.14 MG/200ML-% IV SOLN
60.0000 mg/h | INTRAVENOUS | Status: AC
  Administered 2017-10-03: 60 mg/h via INTRAVENOUS

## 2017-10-03 MED ORDER — ETOMIDATE 2 MG/ML IV SOLN
INTRAVENOUS | Status: DC | PRN
  Administered 2017-10-03: 12 mg via INTRAVENOUS

## 2017-10-03 MED ORDER — SUCCINYLCHOLINE CHLORIDE 20 MG/ML IJ SOLN
INTRAMUSCULAR | Status: DC | PRN
  Administered 2017-10-03: 120 mg via INTRAVENOUS

## 2017-10-03 MED ORDER — PROPOFOL 1000 MG/100ML IV EMUL
0.0000 ug/kg/min | INTRAVENOUS | Status: DC
  Administered 2017-10-03: 50 ug/kg/min via INTRAVENOUS
  Administered 2017-10-04: 30 ug/kg/min via INTRAVENOUS
  Filled 2017-10-03: qty 100

## 2017-10-03 MED ORDER — FENTANYL CITRATE (PF) 100 MCG/2ML IJ SOLN
50.0000 ug | INTRAMUSCULAR | Status: DC | PRN
  Administered 2017-10-03: 50 ug via INTRAVENOUS
  Filled 2017-10-03: qty 2

## 2017-10-03 MED ORDER — METOPROLOL TARTRATE 25 MG PO TABS
25.0000 mg | ORAL_TABLET | Freq: Once | ORAL | Status: AC
  Administered 2017-10-03: 25 mg via ORAL
  Filled 2017-10-03: qty 1

## 2017-10-03 MED ORDER — INSULIN ASPART 100 UNIT/ML ~~LOC~~ SOLN
1.0000 [IU] | SUBCUTANEOUS | Status: DC
  Administered 2017-10-04: 2 [IU] via SUBCUTANEOUS
  Administered 2017-10-04 – 2017-10-06 (×9): 1 [IU] via SUBCUTANEOUS

## 2017-10-03 MED ORDER — FUROSEMIDE 10 MG/ML IJ SOLN
10.0000 mg/h | INTRAVENOUS | Status: DC
  Administered 2017-10-04: 8 mg/h via INTRAVENOUS
  Administered 2017-10-05 (×2): 10 mg/h via INTRAVENOUS
  Filled 2017-10-03: qty 25
  Filled 2017-10-03: qty 21
  Filled 2017-10-03 (×3): qty 25

## 2017-10-03 MED ORDER — SODIUM CHLORIDE 0.9 % IV SOLN
250.0000 mL | INTRAVENOUS | Status: DC | PRN
  Administered 2017-10-06: 08:00:00 via INTRAVENOUS
  Administered 2017-10-07: 35 mL via INTRAVENOUS

## 2017-10-03 MED ORDER — ORAL CARE MOUTH RINSE
15.0000 mL | OROMUCOSAL | Status: DC
  Administered 2017-10-04 (×2): 15 mL via OROMUCOSAL

## 2017-10-03 MED ORDER — DOCUSATE SODIUM 50 MG/5ML PO LIQD: 100.0000 mg | Freq: Two times a day (BID) | ORAL | Status: DC | PRN

## 2017-10-03 MED ORDER — FAMOTIDINE 40 MG/5ML PO SUSR
20.0000 mg | Freq: Two times a day (BID) | ORAL | Status: DC
  Administered 2017-10-04 – 2017-10-06 (×7): 20 mg
  Filled 2017-10-03 (×7): qty 2.5

## 2017-10-03 NOTE — Progress Notes (Signed)
ANTICOAGULATION CONSULT NOTE - Follow-Up  Pharmacy Consult for Heparin Indication: atrial fibrillation  No Known Allergies  Patient Measurements: Height: 5\' 10"  (177.8 cm) Weight: 233 lb 4.8 oz (105.8 kg) IBW/kg (Calculated) : 73 Heparin Dosing Weight: 97kg  Vital Signs: Temp: 98.5 F (36.9 C) (07/23 0452) Temp Source: Oral (07/23 0452) BP: 100/60 (07/23 0830) Pulse Rate: 115 (07/23 0830)  Labs: Recent Labs    10/01/17 0307 10/02/17 0453 10/03/17 0647  HGB 10.7* 10.7* 11.0*  HCT 32.3* 32.4* 33.8*  PLT 237 234 234  HEPARINUNFRC 0.32 0.41 0.42  CREATININE 1.21 1.07 1.04    Estimated Creatinine Clearance: 83.9 mL/min (by C-G formula based on SCr of 1.04 mg/dL).   Medical History: Past Medical History:  Diagnosis Date  . CHF (congestive heart failure) ([place])   . Chronic systolic heart failure ([place]) 04/15/2015    EF 30-35% by echo 2016 , [facility]   . Hypertension   . Lung mass 04/15/2015    Never biopsied. History of PET scan that raised concern.   . Metabolic syndrome 08/16/8470  . OSA on CPAP   . Paroxysmal atrial fibrillation ([place]) 04/15/2015   On apixaban   . Prostate cancer ([place])   . Thrombocytopenia ([place]) 04/15/2015    Medications:  Infusions:  . sodium chloride    . heparin 1,850 Units/hr (10/03/17 0827)    Assessment: 7 yom presented to the ED with afib with RVR from cardiologist office. Pharmacy consulted to dose heparin. He has been on xarelto in the past but stopped taking with ongoing hemoptysis.  Cardioversion 7/19 unsuccessful, pt remains in a-fib. CBC stable.  Heparin level today therapeutic at 0.42 on 1850 units/hr.   Goal of Therapy:  Heparin level 0.3-0.7 units/ml Monitor platelets by anticoagulation protocol: Yes   Plan:  Continue heparin 1850 units/hr. Next heparin level with AM labs. Monitor daily heparin level and CBC, s/sx bleeding  Thank you for involving pharmacy in this patient's care.  Isaias Sakai, Sherian Rein D PGY1 Pharmacy Resident   Phone [phone] 10/03/2017      10:45 AM  **Pharmacist phone directory can now be found on [email or web address] ([email or web address]).  Listed under [place].

## 2017-10-03 NOTE — Progress Notes (Signed)
Patient c/o tightness on left side. S/p liver bx 7/22. Abdomen taut. + bowel sounds. Vitals stable. Notified on call. Awaiting reply.

## 2017-10-03 NOTE — Progress Notes (Signed)
Follow up to previous note, patient's abd is now soft. Patient was able to release some gas and stated that he felt better. Will continue to monitor

## 2017-10-03 NOTE — Progress Notes (Signed)
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 Since then he has undergone thoracentesis 7/17 and liver biopsy 7/22. Patient has been hypotensive which has limited ability to diurese him. Today his amiodarone infusion was stopped as there was concern it was causing hypotension. Then this evening patient went into Afib with RVR and Flash pulmonary edema. He then became unresponsive due to acute hypercapnea. Cardiology and Anesthesiology at bedside bagging patient. [facility]'s code pager was activated (although patient never lost a pulse) and [facility] asked PCCM ground team to report emergently to bedside. Cardiology gave history as listed above. Following intubation, patient transferred to ICU under PCCM service.    PAST MEDICAL HISTORY :  He  has a past medical history of CHF (congestive heart failure) ([place]), Chronic systolic heart failure ([place]) (04/15/2015), Hypertension, Lung mass (71/69/6789), Metabolic syndrome (05/20/1015), OSA on CPAP, Paroxysmal atrial fibrillation ([place]) (04/15/2015), Prostate cancer ([place]), and Thrombocytopenia ([place]) (04/15/2015).  PAST SURGICAL HISTORY: He  has a past surgical history that includes Prostate biopsy; Cardiac catheterization (N/A, 04/27/2015); IR THORACENTESIS ASP PLEURAL SPACE W/IMG GUIDE (09/27/2017); TEE without cardioversion (N/A, 09/29/2017); and Cardioversion (N/A, 09/29/2017).  No Known Allergies  No current facility-administered medications on file prior to encounter.    Current Outpatient Medications on File Prior to Encounter  Medication Sig  . aspirin 81 MG tablet Take 81 mg by mouth daily.  .  carvedilol (COREG) 12.5 MG tablet Take 1 tablet (12.5 mg total) by mouth 2 (two) times daily.  . Cholecalciferol ([employer] VIT D 5000 HIGH-POTENCY) 5000 units capsule Take 5,000 Units by mouth daily.  . furosemide (LASIX) 40 MG tablet Take 1 tablet (40 mg total) by mouth daily.  . Multiple Vitamin (MULTIVITAMIN) tablet Take 1 tablet by mouth daily.  . sacubitril-valsartan (ENTRESTO) 24-26 MG Take 1 tablet by mouth 2 (two) times daily.    FAMILY HISTORY:  His family history includes Hypertension in his father; Parkinson's disease in his mother.  SOCIAL HISTORY: He  reports that he quit smoking about 43 years ago. His smoking use included cigarettes, cigars, and pipe. He quit after 6.50 years of use. He has never used smokeless tobacco. He reports that he drinks alcohol. He reports that he does not use drugs.  REVIEW OF SYSTEMS:   Review of Systems  Unable to perform ROS: Critical illness   SUBJECTIVE:  Intubated, Sedated   VITAL SIGNS: BP 122/88 (BP Location: Right Arm)   Pulse (!) 121   Temp 98.6 F (37 C) (Oral)   Resp (!) 26   Ht _0  (1.803 m)   Wt 105.8 kg (233 lb 4.8 oz)   SpO2 100%   BMI 32.54 kg/m   HEMODYNAMICS:  Normotensive   VENTILATOR SETTINGS: Vent Mode: PRVC FiO2 (%):  [100 %] 100 % Set Rate:  [20 bmp] 20 bmp Vt Set:  [600 mL] 600 mL PEEP:  [8 cmH20] 8 cmH20 Plateau Pressure:  [37 cmH20] 37 cmH20  INTAKE / OUTPUT: I/O last 3 completed shifts: In: 976.3 [I.V.:976.3] Out: 1780 [PZWCH:8527]  PHYSICAL EXAMINATION: General: WDWN Adult male, Intubated, Unresponsive, Critically ill Neuro: PERRL, Unresponsive to sternal rub, Cough  to ETT suction HEENT: Orally intubated, OP clear, MM moist  Cardiovascular: Irreg irreg with HR in 120's Lungs: Coarse rhonchi bilaterally; Bloody appearing pulmonary edema in ETT Abdomen: Obese soft NTND Musculoskeletal: 1+ BLE edema  Skin: no rashes   LABS:  BMET Recent Labs  Lab 10/01/17 0307 10/02/17 0453 10/03/17 0647   NA 139 141 139  K 3.9 3.5 3.8  CL 97* 97* 99  CO2 33* 34* 31  BUN _0 CREATININE 1.21 1.07 1.04  GLUCOSE 102* 106* 128*   Electrolytes Recent Labs  Lab 10/01/17 0307 10/02/17 0453 10/03/17 0647  CALCIUM 8.6* 8.8* 8.8*   CBC Recent Labs  Lab 10/01/17 0307 10/02/17 0453 10/03/17 0647  WBC 7.4 6.6 8.5  HGB 10.7* 10.7* 11.0*  HCT 32.3* 32.4* 33.8*  PLT 237 234 234   Coag's Recent Labs  Lab 09/29/17 0434  INR 1.28   Sepsis Markers No results for input(s): LATICACIDVEN, PROCALCITON, O2SATVEN in the last 168 hours.  ABG No results for input(s): PHART, PCO2ART, PO2ART in the last 168 hours.  Liver Enzymes No results for input(s): AST, ALT, ALKPHOS, BILITOT, ALBUMIN in the last 168 hours.  Cardiac Enzymes No results for input(s): TROPONINI, PROBNP in the last 168 hours.  Glucose No results for input(s): GLUCAP in the last 168 hours.  Imaging No results found.  STUDIES:  CTA PE 03/2014 >>  neg for PE; A large right infrahilar soft tissue mass is identified measuring 7.7 x 6.1 cm in transaxial dimensions by 5.8 cm in craniocaudal height. This demonstrates heterogeneous enhancement. Mild diffuse groundglass densities are identified throughout the lungs;  mildly enlarged precarinal lymph node measuring 1.4 cm in short axis.  PET 04/07/2015  >> 1.  Hypermetabolic right lower lobe mass max SUV 13.2 measuring 6.2 x 7.9 cm (image128). The mass previously measured 6.1 x 7.7 cm 2.  12 mm hypermetabolic left adrenal nodule (image 155). 3.  No other worrisome hypermetabolic activity is identified. 4.  No hypermetabolic 4 mm subpleural right upper lobe nodule (image 24) 2 small            accurately characterize by PET.  09/26/2017 CT chest without contrast >>  1. Large RIGHT lower lobe mass with central and peripheral calcifications occupies the near entirety of the RIGHT lower lobe and concerning for neoplasm. 2. Moderate RIGHT effusion. 3. Mild mediastinal and  RIGHT hilar adenopathy. 4. Recommend FDG PET scan for biopsy planning/staging  7/17 CT abd and pelvis >>  1. Large complex masses in the right lower lobe of the lung and in the right hepatic lobe. Smaller liver lesions in the left hepatic lobe. Nonspecific lesions in the spleen and kidneys. No significant pathologic adenopathy. This would not be a characteristic metastatic pattern for prostate cancer and is not a specific metastatic and imaging pattern to suggest an individual etiology. Possibilities might include metastatic lung cancer, melanoma, metastatic hepatocellular carcinoma, or a variety of other possibilities. Tissue diagnosis recommended. 2. Moderate prostatomegaly. 3. Scattered mild ascites. 4. Moderate cardiomegaly. Small inferior pericardial effusion. 5. Trace bilateral pleural effusions  CULTURES: Sputum culture: ordered   ANTIBIOTICS: None   SIGNIFICANT EVENTS: 7/16 Admit to cardiology  7/23: Afib with RVR >> Flash pulm edema >> Respiratory failure requiring intubation  LINES/TUBES: PIV's Foley catheter 7/23>> ETT 7/23>> OG tube 7/23>>  DISCUSSION: 66yoM with hx CHF (EF 25-30%), HTN, Afib (on ASA as outpatient, had hemoptysis when on Xarelto), OSA on CPAP, Prostate cancer, and hypermetabolic RLL mass with  left adrenal nodule (diagnosed on PET scan 03/2015), admitted with CHF exacerbation and Afib with RVR. Developed Afib with RVR and Flash pulmonary edema on 7/23 PM requiring intubation.   ASSESSMENT / PLAN:  PULMONARY 1. Acute Hypoxic and Hypercapneic Respiratory failure; Flash pulmonary edema - reviewed ABG showing severe acute-on-chronic hypercapnea (7.10/103/117/30); increased RR from 20 to 24 on vent. Repeat ABG in 1 hour - received 43m Lasix IV during intubation; will start lasix infusion - CXR on my review shows diffuse bilateral pulmonary edema 2. RLL Mass: hypermetabolic on PET scan, likely malignant in nature. Biopsy of liver met pending.  3. Right  pleural effusion: s/p thoracentesis 7/17 with cytology showing reactive mesothelial cells  CARDIOVASCULAR 1. Afib with RVR; Acute systolic CHF exacerbation: - unsuccessful cardioversion on 7/19, returning to Afib following procedure.   - continue amiodarone infusion - Echo now shows EF 10-15% from baseline of 25%, likely worsening EF due to the Afib RVR.  - has remained fluid overloaded but difficult to diurese given hypotension. - currently normotensive following intubation; continue diuresis as BP tolerates. Hold the [place] we have room in his BP to diurese.  - continue heparin infusion - check BMP and Mg now  - check cortisol level given that he has adrenal mets and has had soft BP's this admission; may be adrenally insufficient  RENAL 1. AKI: - creatinine 1.3 (on ISTAT) up from baseline of 1.04, likely cardiorenal from volume overload - place foley catheter; continue diuresis as BP and renal function tolerate. Monitor UOP closely. Avoid nephrotoxic agents   GASTROINTESTINAL 1. Anemia: - Hgb 11.0 which is at his baseline of 10-11; continue monitoring.   HEMATOLOGIC 1. 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 Initial concern was for a malignant pleural effusion as well, although cytology was negative. Liver biopsy on 7/22 is still pending. Although he has a history of prostate cancer, it would be unusual for it to metastasize to the lung in this fashion.   INFECTIOUS No active issues   ENDOCRINE 1. Hyperglycemia: no hx of DM - start SSI q4hrs  NEUROLOGIC 1. Acute Encephalopathy due to Hypercapnea: - see plan above for plan for treating the hypercapnea - fentanyl and versed PRN; propofol infusion for vent tolerance    FAMILY  - Updated patient's wife at bedside  - Inter-disciplinary family meet or Palliative Care meeting due by: 10/09/17   60 minutes  nonprocedural critical care time  KVernie Murders MD  Pulmonary and CRealitosPager: [phone] 10/03/2017, 11:17 PM

## 2017-10-03 NOTE — Progress Notes (Signed)
Patient's HR reached 156 around 0418. HR has been fluctuating around 110's -130's. Patient inquired about if his heart has reached that high since he has been here. Informed patient that I will pass along his concern about his heart rate to the oncoming staff. Will continue to monitor

## 2017-10-03 NOTE — Progress Notes (Signed)
Patient ambulated in hallway x1 independently. Patient heart rate up to 135 on monitor. Will monitor patient. Jeanenne Licea, Bettina Gavia RN

## 2017-10-03 NOTE — Anesthesia Procedure Notes (Signed)
Procedure Name: Intubation Date/Time: 10/03/2017 10:43 PM Performed by: Babs Bertin, CRNA Pre-anesthesia Checklist: Patient identified, Emergency Drugs available, Suction available and Patient being monitored Patient Re-evaluated:Patient Re-evaluated prior to induction Oxygen Delivery Method: Ambu bag Preoxygenation: Pre-oxygenation with 100% oxygen Induction Type: IV induction and Rapid sequence Laryngoscope Size: Mac and 4 Grade View: Grade I Tube type: Subglottic suction tube Tube size: 7.5 mm Number of attempts: 1 Airway Equipment and Method: Stylet and Oral airway Placement Confirmation: ETT inserted through vocal cords under direct vision,  positive ETCO2 and breath sounds checked- equal and bilateral Secured at: 23 cm Tube secured with: Tape Dental Injury: Teeth and Oropharynx as per pre-operative assessment

## 2017-10-03 NOTE — Plan of Care (Signed)
  Problem: Education: Goal: Knowledge of General Education information will improve Outcome: Progressing   Problem: Health Behavior/Discharge Planning: Goal: Ability to manage health-related needs will improve Outcome: Progressing   Problem: Clinical Measurements: Goal: Respiratory complications will improve Outcome: Progressing Goal: Cardiovascular complication will be avoided Outcome: Progressing   Problem: Pain Managment: Goal: General experience of comfort will improve Outcome: Progressing   Problem: Activity: Goal: Ability to tolerate increased activity will improve Outcome: Progressing

## 2017-10-03 NOTE — Progress Notes (Signed)
Progress Note  Patient Name: Logan [name] Date of Encounter: 10/03/2017  Primary Cardiologist: Sinclair Grooms, MD  Subjective   Feeling fine. No CP, SOB, palpitations. HR remains 100-120s.  He had ultrasound-guided liver biopsy performed by Dr. Anselm Pancoast yesterday obtaining 4 core samples.  Pathology and cytology are pending.  Inpatient Medications    Scheduled Meds: . amiodarone  400 mg Oral BID  . aspirin EC  81 mg Oral Daily  . carvedilol  3.125 mg Oral BID WC  . sodium chloride flush  3 mL Intravenous Q12H   Continuous Infusions: . sodium chloride    . heparin 1,850 Units/hr (10/03/17 0827)   PRN Meds: sodium chloride, acetaminophen, ondansetron (ZOFRAN) IV, sodium chloride flush   Vital Signs    Vitals:   10/02/17 1400 10/02/17 2230 10/03/17 0452 10/03/17 0830  BP: 107/80 92/68 105/76 100/60  Pulse: (!) 111 93 (!) 126 (!) 115  Resp: (!) 22 (!) 29 (!) 22   Temp:  98.3 F (36.8 C) 98.5 F (36.9 C)   TempSrc:  Oral Oral   SpO2: 97% 96% 97%   Weight:   233 lb 4.8 oz (105.8 kg)   Height:        Intake/Output Summary (Last 24 hours) at 10/03/2017 0922 Last data filed at 10/03/2017 0827 Gross per 24 hour  Intake 285.26 ml  Output 1380 ml  Net -1094.74 ml   Filed Weights   10/01/17 0508 10/02/17 0402 10/03/17 0452  Weight: 237 lb 8 oz (107.7 kg) 233 lb 11.2 oz (106 kg) 233 lb 4.8 oz (105.8 kg)    Telemetry    Atrial fib HR 100-120s - Personally Reviewed  Physical Exam   GEN: No acute distress.  HEENT: Normocephalic, atraumatic, sclera non-icteric. Neck: No JVD or bruits. Cardiac: Irregularly irregular, tachycardic, no murmurs, rubs, or gallops.  Radials/DP/PT 1+ and equal bilaterally.  Respiratory: No BS R base, clear L base. Breathing is unlabored. GI: Soft, nontender, non-distended, BS +x 4. MS: no deformity. Extremities: No clubbing or cyanosis. Trace pedal edema with compression hose.  Neuro:  AAOx3. Follows commands. Psych:  Responds to  questions appropriately with a normal affect.  Labs    Chemistry Recent Labs  Lab 09/26/17 1102 09/26/17 1559  10/01/17 0307 10/02/17 0453 10/03/17 0647  NA 138 138   < > 139 141 139  K 4.8 4.3   < > 3.9 3.5 3.8  CL 98 98   < > 97* 97* 99  CO2 32 30   < > 33* 34* 31  GLUCOSE 115* 117*   < > 102* 106* 128*  BUN 17 16   < > 17 13 12   CREATININE 1.22 1.10   < > 1.21 1.07 1.04  CALCIUM 8.8* 8.5*   < > 8.6* 8.8* 8.8*  PROT 7.3 7.2  --   --   --   --   ALBUMIN 2.6* 2.7*  --   --   --   --   AST 35 32  --   --   --   --   ALT 45* 46*  --   --   --   --   ALKPHOS 94 89  --   --   --   --   BILITOT 0.8 0.8  --   --   --   --   GFRNONAA 60* >60   < > >60 >60 >60  GFRAA >60 >60   < > >60 >60 >  60  ANIONGAP 8 10   < > 9 10 9    < > = values in this interval not displayed.     Hematology Recent Labs  Lab 10/01/17 0307 10/02/17 0453 10/03/17 0647  WBC 7.4 6.6 8.5  RBC 3.58* 3.59* 3.71*  HGB 10.7* 10.7* 11.0*  HCT 32.3* 32.4* 33.8*  MCV 90.2 90.3 91.1  MCH 29.9 29.8 29.6  MCHC 33.1 33.0 32.5  RDW 14.0 14.1 14.3  PLT 237 234 234    Cardiac Enzymes Recent Labs  Lab 09/26/17 1102 09/26/17 1559 09/26/17 2131  TROPONINI 0.04* 0.03* <0.03   No results for input(s): TROPIPOC in the last 168 hours.   BNP Recent Labs  Lab 09/26/17 0955  BNP 537.4*     DDimer No results for input(s): DDIMER in the last 168 hours.   Radiology    US Biopsy (liver)  Result Date: 10/02/2017 INDICATION: 67 year old with a suspicious right lung mass and indeterminate liver lesions. Tissue diagnosis is needed. EXAM: ULTRASOUND-GUIDED CORE BIOPSY OF LIVER LESION MEDICATIONS: None. ANESTHESIA/SEDATION: Moderate (conscious) sedation was employed during this procedure. A total of Versed 1 mg and Fentanyl 50 mcg was administered intravenously. Moderate Sedation Time: 14 minutes. The patient's level of consciousness and vital signs were monitored continuously by radiology nursing throughout the  procedure under my direct supervision. FLUOROSCOPY TIME:  None COMPLICATIONS: None immediate. PROCEDURE: Informed written consent was obtained from the patient after a thorough discussion of the procedural risks, benefits and alternatives. All questions were addressed. A timeout was performed prior to the initiation of the procedure. The liver was evaluated with ultrasound. The lesion in the medial left hepatic lobe, segment 4B,was targeted for biopsy. The anterior abdomen was prepped chlorhexidine and sterile field was created. Skin and soft tissues were anesthetized with 1% lidocaine. 17 gauge coaxial needle was directed into the lesion with ultrasound guidance. A total of 4 core biopsies were obtained with an 18 gauge core device. Specimens placed in formalin. Gel-Foam slurry was injected through the 17 gauge coaxial needle as it was removed. Bandage placed over the puncture site. FINDINGS: 2.5 cm lesion in the medial left hepatic lobe with a peripheral hypoechoic rim. Patient has a larger lesion in the right hepatic lobe but this is poorly marginated and more difficult to accurately biopsy. Medial left hepatic lobe lesion was successfully targeted and biopsied. No significant bleeding or hematoma formation following the core biopsies. IMPRESSION: Successful ultrasound-guided liver lesion biopsy. Electronically Signed   By: Markus Daft M.D.   On: 10/02/2017 16:05    Cardiac Studies   2D echo 09/26/17 Study Conclusions - Left ventricle: The cavity size was moderately dilated. Wall   thickness was normal. There was adverse spherical remodeling. The   estimated ejection fraction was in the range of 10% to 15%.   Severe diffuse hypokinesis with no identifiable regional   variations. - Mitral valve: There was mild to moderate regurgitation directed   posteriorly. - Left atrium: The atrium was moderately dilated. - Right ventricle: The cavity size was severely dilated. Systolic   function was severely  reduced. - Right atrium: The atrium was moderately dilated. - Pericardium, extracardiac: A small pericardial effusion was   identified along the right atrial free wall. There was no   evidence of hemodynamic compromise.  TEE 09/29/17 Study Conclusions - Left ventricle: Moderately dilated with mild LVH. The estimated   ejection fraction was 15%. Diffuse hypokinesis. - Mitral valve: Dilated annulus. Moderate regurgitation. - Left atrium: The  atrium was severely dilated. Smoke was noted. No   evidence of thrombus in the atrial cavity or appendage. - Right atrium: The atrium was dilated. - Atrial septum: No defect or patent foramen ovale was identified. - Pericardium, extracardiac: A trivial pericardial effusion was   identified. Impressions: - No LAA thrombus. Unsuccessful DCCV.  Patient Profile     67 y.o. male with NICM EF 25% with normal cors 04/2015, PAF, prostate CA, hypermetabolic RLL mass and L adrenal nodule on PET 03/2015 (pt declined further workup), OSA, thrombocytopenia. Recently seen at [facility] with bilateral effusions, signed out AMA. Seen in the office 7/16 with worsening SOB, edema, found to be in atrial fib RVR. Previously on Xarelto but stopped it due to hemoptysis (has had for 2.5 yrs). Found to have large RLL mass occupying near entirety of RLL, with mild mediastinal and R hilar lymphadenopathy. Underwent IR thoracentesis 7/17 (reactive mesothelial cells). CT abd/pelvis with large complex masses in RLL and R hepatic liver lobe, nonspecific lesion in spleen/liver - not characteristic for prostate CA (possibly r/t metastatic lung cancer, melanoma, metastatic hepatocellular carcinoma, or a variety of other possibilities), also with moderate prostatomegaly, small inferior pericardial effusion, moderate prostatomegaly. Found to have severe biventricular HF, underwent TEE/DCCV on 7/19 but did not hold sinus rhythm.  He underwent ultrasound-guided liver biopsy removing 4 core samples  by Dr. Anselm Pancoast yesterday.  Assessment & Plan    1. Recurrent atrial fibrillation with RVR - unsuccessful TEE/DCCV 7/19, being loaded with amiodarone (on this since 7/19) - will discuss dosing with MD. Need to hold off on retrial of DCCV until all procedures complete.  I suspect atrial fib will remain systemically difficult to control with his underlying oncologic issues. Continue carvedilol as tolerated.  2. Acute on chronic systolic CHF with severe biventricular failure EF 10-15% -normal cors 2017, EF 25% at that time so suspect this may be worsening failure in the setting of AF RVR. He was hypotensive to the 80s last night. I will hold Entresto 24/26mg  and Lasix 60mg  IV TID on hold for now given hypotension.  Clinically we could probably scale back IV Lasix; weight is now 4lb below his prior baseline and I/Os  are negative 6.7 L.  Continue carvedilol as BP allows.  3. Large RLL mass with complex liver mass - wife states they were told IR bx is planned for noon. This seems to be the bigger problem of all of the things he is here for. Pulm, oncology and IR are all on board.  4. Hypotension - as above.  5. Anemia - normocytic. No bleeding. Follow.  For questions or updates, please contact [place] Please consult [email or web address] for contact info under Cardiology/STEMI.  Lorretta Harp, M.D., [place], [facility], [place] [address] [place], [place]  [id number]  [phone] 10/03/2017 9:22 AM

## 2017-10-03 NOTE — Progress Notes (Signed)
RT Note:  Patient has home CPAP at bedside. States he is able to place himself on/off as needed. Encouraged patient to call for assistance if needed.

## 2017-10-03 NOTE — Progress Notes (Signed)
Notified cardiology concerning patient's heart rate 130-160's at rest. Patient asymptomatic as this time. Denies SOB/CP. Instructed to give patient scheduled amio to see if that will help with the heart rate. Will notify on call cardiology if heart rate remains 140-150's. Will continue to monitor.

## 2017-10-03 NOTE — Progress Notes (Signed)
[facility] PCCM  Will follow up liver biopsy results with you.  Pulmonary following  Roselie Awkward, MD [place] PCCM Pager: [phone] Cell: [phone] After 3pm or if no response, call [phone]

## 2017-10-04 ENCOUNTER — Inpatient Hospital Stay: Payer: Self-pay

## 2017-10-04 ENCOUNTER — Ambulatory Visit: Payer: 59 | Admitting: Interventional Cardiology

## 2017-10-04 ENCOUNTER — Inpatient Hospital Stay (HOSPITAL_COMMUNITY): Payer: 59

## 2017-10-04 DIAGNOSIS — C3491 Malignant neoplasm of unspecified part of right bronchus or lung: Secondary | ICD-10-CM

## 2017-10-04 DIAGNOSIS — J9600 Acute respiratory failure, unspecified whether with hypoxia or hypercapnia: Secondary | ICD-10-CM

## 2017-10-04 DIAGNOSIS — C787 Secondary malignant neoplasm of liver and intrahepatic bile duct: Secondary | ICD-10-CM

## 2017-10-04 DIAGNOSIS — C3490 Malignant neoplasm of unspecified part of unspecified bronchus or lung: Secondary | ICD-10-CM

## 2017-10-04 DIAGNOSIS — J810 Acute pulmonary edema: Secondary | ICD-10-CM

## 2017-10-04 LAB — GLUCOSE, CAPILLARY
GLUCOSE-CAPILLARY: 180 mg/dL — AB (ref 70–99)
GLUCOSE-CAPILLARY: 125 mg/dL — AB (ref 70–99)
GLUCOSE-CAPILLARY: 142 mg/dL — AB (ref 70–99)
GLUCOSE-CAPILLARY: 108 mg/dL — AB (ref 70–99)
Glucose-Capillary: 128 mg/dL — ABNORMAL HIGH (ref 70–99)
Glucose-Capillary: 125 mg/dL — ABNORMAL HIGH (ref 70–99)
Glucose-Capillary: 116 mg/dL — ABNORMAL HIGH (ref 70–99)

## 2017-10-04 LAB — CBC WITH DIFFERENTIAL/PLATELET
Abs Immature Granulocytes: 0.1 10*3/uL (ref 0.0–0.1)
Basophils Absolute: 0.1 10*3/uL (ref 0.0–0.1)
Basophils Relative: 1 %
EOS ABS: 0.1 10*3/uL (ref 0.0–0.7)
Eosinophils Relative: 0 %
HEMATOCRIT: 37.2 % — AB (ref 39.0–52.0)
Hemoglobin: 11.5 g/dL — ABNORMAL LOW (ref 13.0–17.0)
Immature Granulocytes: 1 %
LYMPHS ABS: 1.6 10*3/uL (ref 0.7–4.0)
Lymphocytes Relative: 14 %
MCH: 29.3 pg (ref 26.0–34.0)
MCHC: 30.9 g/dL (ref 30.0–36.0)
MCV: 94.7 fL (ref 78.0–100.0)
MONO ABS: 0.4 10*3/uL (ref 0.1–1.0)
Monocytes Relative: 4 %
Neutro Abs: 9.2 10*3/uL — ABNORMAL HIGH (ref 1.7–7.7)
Neutrophils Relative %: 80 %
Platelets: 295 10*3/uL (ref 150–400)
RBC: 3.93 MIL/uL — ABNORMAL LOW (ref 4.22–5.81)
RDW: 14.6 % (ref 11.5–15.5)
WBC: 11.5 10*3/uL — AB (ref 4.0–10.5)

## 2017-10-04 LAB — BASIC METABOLIC PANEL
Anion gap: 11 (ref 5–15)
Anion gap: 13 (ref 5–15)
BUN: 15 mg/dL (ref 8–23)
BUN: 16 mg/dL (ref 8–23)
CHLORIDE: 101 mmol/L (ref 98–111)
CHLORIDE: 97 mmol/L — AB (ref 98–111)
CO2: 29 mmol/L (ref 22–32)
CO2: 30 mmol/L (ref 22–32)
Calcium: 8.5 mg/dL — ABNORMAL LOW (ref 8.9–10.3)
Calcium: 8.8 mg/dL — ABNORMAL LOW (ref 8.9–10.3)
Creatinine, Ser: 1.55 mg/dL — ABNORMAL HIGH (ref 0.61–1.24)
Creatinine, Ser: 1.56 mg/dL — ABNORMAL HIGH (ref 0.61–1.24)
GFR calc Af Amer: 51 mL/min — ABNORMAL LOW (ref >60)
GFR calc Af Amer: 52 mL/min — ABNORMAL LOW (ref >60)
GFR calc non Af Amer: 44 mL/min — ABNORMAL LOW (ref >60)
GFR calc non Af Amer: 45 mL/min — ABNORMAL LOW (ref >60)
GLUCOSE: 124 mg/dL — AB (ref 70–99)
GLUCOSE: 238 mg/dL — AB (ref 70–99)
POTASSIUM: 3.9 mmol/L (ref 3.5–5.1)
POTASSIUM: 4.9 mmol/L (ref 3.5–5.1)
Sodium: 140 mmol/L (ref 135–145)
Sodium: 141 mmol/L (ref 135–145)

## 2017-10-04 LAB — CBC
HEMATOCRIT: 32 % — AB (ref 39.0–52.0)
Hemoglobin: 10.3 g/dL — ABNORMAL LOW (ref 13.0–17.0)
MCH: 29.7 pg (ref 26.0–34.0)
MCHC: 32.2 g/dL (ref 30.0–36.0)
MCV: 92.2 fL (ref 78.0–100.0)
PLATELETS: 227 10*3/uL (ref 150–400)
RBC: 3.47 MIL/uL — ABNORMAL LOW (ref 4.22–5.81)
RDW: 14.6 % (ref 11.5–15.5)
WBC: 10 10*3/uL (ref 4.0–10.5)

## 2017-10-04 LAB — BLOOD GAS, ARTERIAL
Acid-Base Excess: 3.9 mmol/L — ABNORMAL HIGH (ref 0.0–2.0)
Acid-Base Excess: 5.5 mmol/L — ABNORMAL HIGH (ref 0.0–2.0)
BICARBONATE: 29 mmol/L — AB (ref 20.0–28.0)
Bicarbonate: 29.9 mmol/L — ABNORMAL HIGH (ref 20.0–28.0)
DRAWN BY: 511471
Drawn by: 511471
FIO2: 0.6
FIO2: 100
LHR: 24 {breaths}/min
MECHVT: 600 mL
MECHVT: 600 mL
O2 SAT: 97.7 %
O2 Saturation: 97.3 %
PATIENT TEMPERATURE: 98.6
PEEP/CPAP: 8 cmH2O
PEEP: 8 cmH2O
PH ART: 7.422 (ref 7.350–7.450)
PO2 ART: 111 mmHg — AB (ref 83.0–108.0)
PO2 ART: 117 mmHg — AB (ref 83.0–108.0)
Patient temperature: 98.6
RATE: 24 resp/min
pCO2 arterial: 46.7 mmHg (ref 32.0–48.0)
pCO2 arterial: 52.1 mmHg — ABNORMAL HIGH (ref 32.0–48.0)
pH, Arterial: 7.364 (ref 7.350–7.450)

## 2017-10-04 LAB — URINALYSIS, ROUTINE W REFLEX MICROSCOPIC
BILIRUBIN URINE: NEGATIVE
Bacteria, UA: NONE SEEN
Glucose, UA: 50 mg/dL — AB
Ketones, ur: NEGATIVE mg/dL
LEUKOCYTES UA: NEGATIVE
Nitrite: NEGATIVE
PH: 6 (ref 5.0–8.0)
Protein, ur: 100 mg/dL — AB
SPECIFIC GRAVITY, URINE: 1.016 (ref 1.005–1.030)

## 2017-10-04 LAB — LACTIC ACID, PLASMA
LACTIC ACID, VENOUS: 3.1 mmol/L — AB (ref 0.5–1.9)
Lactic Acid, Venous: 3.9 mmol/L (ref 0.5–1.9)

## 2017-10-04 LAB — PHOSPHORUS
Phosphorus: 4.3 mg/dL (ref 2.5–4.6)
Phosphorus: 6.8 mg/dL — ABNORMAL HIGH (ref 2.5–4.6)

## 2017-10-04 LAB — PROCALCITONIN

## 2017-10-04 LAB — BRAIN NATRIURETIC PEPTIDE: B Natriuretic Peptide: 362.5 pg/mL — ABNORMAL HIGH (ref 0.0–100.0)

## 2017-10-04 LAB — MAGNESIUM
MAGNESIUM: 2 mg/dL (ref 1.7–2.4)
Magnesium: 2.2 mg/dL (ref 1.7–2.4)

## 2017-10-04 LAB — HIV ANTIBODY (ROUTINE TESTING W REFLEX): HIV SCREEN 4TH GENERATION: NONREACTIVE

## 2017-10-04 LAB — TROPONIN I
TROPONIN I: 0.04 ng/mL — AB (ref <0.03)
TROPONIN I: 0.04 ng/mL — AB (ref <0.03)
Troponin I: 0.04 ng/mL (ref <0.03)

## 2017-10-04 LAB — CORTISOL: Cortisol, Plasma: 29.4 ug/dL

## 2017-10-04 LAB — MRSA PCR SCREENING: MRSA BY PCR: NEGATIVE

## 2017-10-04 LAB — HEPARIN LEVEL (UNFRACTIONATED): Heparin Unfractionated: 0.37 IU/mL (ref 0.30–0.70)

## 2017-10-04 LAB — TRIGLYCERIDES: Triglycerides: 104 mg/dL (ref <150)

## 2017-10-04 MED ORDER — CHLORHEXIDINE GLUCONATE CLOTH 2 % EX PADS
6.0000 | MEDICATED_PAD | Freq: Every day | CUTANEOUS | Status: DC
  Administered 2017-10-04 – 2017-10-12 (×7): 6 via TOPICAL

## 2017-10-04 MED ORDER — PHENYLEPHRINE HCL-NACL 10-0.9 MG/250ML-% IV SOLN
0.0000 ug/min | INTRAVENOUS | Status: DC
  Administered 2017-10-04: 20 ug/min via INTRAVENOUS
  Filled 2017-10-04 (×3): qty 250

## 2017-10-04 MED ORDER — SODIUM CHLORIDE 0.9% FLUSH: 10.0000 mL | INTRAVENOUS | Status: DC | PRN

## 2017-10-04 MED ORDER — ACETAMINOPHEN 160 MG/5ML PO SOLN
650.0000 mg | Freq: Four times a day (QID) | ORAL | Status: DC | PRN
  Administered 2017-10-05: 650 mg
  Filled 2017-10-04: qty 20.3

## 2017-10-04 MED ORDER — ORAL CARE MOUTH RINSE
15.0000 mL | OROMUCOSAL | Status: DC
  Administered 2017-10-04 – 2017-10-06 (×21): 15 mL via OROMUCOSAL

## 2017-10-04 MED ORDER — ACETAMINOPHEN 650 MG RE SUPP: 650.0000 mg | Freq: Four times a day (QID) | RECTAL | Status: DC | PRN

## 2017-10-04 MED ORDER — SODIUM CHLORIDE 0.9% FLUSH
10.0000 mL | Freq: Two times a day (BID) | INTRAVENOUS | Status: DC
  Administered 2017-10-04 – 2017-10-05 (×2): 10 mL
  Administered 2017-10-06: 30 mL
  Administered 2017-10-07 – 2017-10-10 (×5): 10 mL
  Administered 2017-10-10: 30 mL
  Administered 2017-10-11 – 2017-10-12 (×2): 10 mL

## 2017-10-04 MED ORDER — ASPIRIN 81 MG PO CHEW
81.0000 mg | CHEWABLE_TABLET | Freq: Every day | ORAL | Status: DC
  Administered 2017-10-04: 81 mg via ORAL
  Filled 2017-10-04: qty 1

## 2017-10-04 MED ORDER — NOREPINEPHRINE 4 MG/250ML-% IV SOLN
0.0000 ug/min | INTRAVENOUS | Status: DC
  Administered 2017-10-04 – 2017-10-05 (×3): 5 ug/min via INTRAVENOUS
  Administered 2017-10-06 – 2017-10-08 (×3): 3 ug/min via INTRAVENOUS
  Filled 2017-10-04 (×7): qty 250

## 2017-10-04 MED ORDER — FENTANYL 2500MCG IN NS 250ML (10MCG/ML) PREMIX INFUSION
0.0000 ug/h | INTRAVENOUS | Status: DC
  Administered 2017-10-04: 25 ug/h via INTRAVENOUS
  Administered 2017-10-04 – 2017-10-06 (×3): 150 ug/h via INTRAVENOUS
  Filled 2017-10-04 (×4): qty 250

## 2017-10-04 MED ORDER — CHLORHEXIDINE GLUCONATE 0.12% ORAL RINSE (MEDLINE KIT): 15.0000 mL | Freq: Two times a day (BID) | OROMUCOSAL | Status: DC

## 2017-10-04 NOTE — Significant Event (Addendum)
Rapid Response Event Note  Overview: AFIB RVR/ Respiratory Distress  Was called by RNs about patient being in AFIB RVR and now having an acute onset of shortness of breath and respiratory distress. I was with another patient with a medical emergency, I instructed the RN to call the primary service provider and RT. I arrived at 2225, patient was hypoxic and unresponsive but still had a pulse. Prior to my arrival, decision was made to trial patient on BIPAP but when I arrived, it was apparent that the patient was in respiratory failure and would need to be emergently intubated. BVM were initiated by RT. I called a Code Blue for respiratory failure and impending cardiopulmonary arrest.  Code Team arrived, Cards MD was at the bedside when I arrived.  PCCM Night MD also came to bedside. Anesthesia responded to the Code Blue and patient was intubated by Anesthesia.   Prior to my arrival, patient was given Lasix and Lopressor. During the RSI by anesthesia, patient was given 14mg  of Etomidate and 120mg  of Succinylcholine and patient was successful intubated at 2243.  I initiated a Propofol infusion at 28mcg/kg/min, patient was bolused a total of 20mg  IV and drip was titrated to 30mcg/kg/min upon arrival to CVICU. Prior to transfer to ICU, after intubation, patient had copious amounts of bloody-pink tinged sputum from the ETT and was frequently suctioned by RT. I placed an OG and RNs placed a foley and patient was transferred to 2H04. Patient was given 73mcg Fentanyl IV and 1mg  Versed IV at 2310 per PCCM MD for analgesia/sedation  Event Summary:   at    Call Time 2202 Arrival Time 2225 Code [place] at 2226 Intubated at 2243 Transferred to [place]  Raphaella Larkin R

## 2017-10-04 NOTE — Progress Notes (Signed)
Patient's heart rate increased to 180. Order given for 5 mg IV metoprolol once and 25 mg metoprolol oral once. Patient's heart decreased to 110's. Patient became SOB, O2 sats 80's. Increased oxygen to 6L [place]. Cardiology paged, rapid response and respiratory called. Patient became more dyspneic, diaphoretic and became unconscious. Patient did not lose pulse. Code blue called. Patient intubated, transferred to [facility].

## 2017-10-04 NOTE — Progress Notes (Signed)
ANTICOAGULATION CONSULT NOTE - Follow-Up  Pharmacy Consult for Heparin Indication: atrial fibrillation  No Known Allergies  Patient Measurements: Height: 5\' 11"  (180.3 cm) Weight: 237 lb 10.5 oz (107.8 kg) IBW/kg (Calculated) : 75.3 Heparin Dosing Weight: 97kg  Vital Signs: Temp: 98.5 F (36.9 C) (07/24 0742) Temp Source: Oral (07/24 0742) BP: 96/75 (07/24 0742) Pulse Rate: 73 (07/24 0742)  Labs: Recent Labs    10/02/17 0453 10/03/17 6812 10/03/17 2347 10/04/17 0512 10/04/17 0741  HGB 10.7* 11.0* 11.5* 10.3*  --   HCT 32.4* 33.8* 37.2* 32.0*  --   PLT 234 234 295 227  --   HEPARINUNFRC 0.41 0.42  --   --  0.37  CREATININE 1.07 1.04 1.55* 1.56*  --   TROPONINI  --   --  0.04* 0.04*  --     Estimated Creatinine Clearance: 57.4 mL/min (A) (by C-G formula based on SCr of 1.56 mg/dL (H)).   Medical History: Past Medical History:  Diagnosis Date  . CHF (congestive heart failure) ([place])   . Chronic systolic heart failure ([place]) 04/15/2015    EF 30-35% by echo 2016 , [facility]   . Hypertension   . Lung mass 04/15/2015    Never biopsied. History of PET scan that raised concern.   . Metabolic syndrome 09/15/1698  . OSA on CPAP   . Paroxysmal atrial fibrillation ([place]) 04/15/2015   On apixaban   . Prostate cancer ([place])   . Thrombocytopenia ([place]) 04/15/2015    Medications:  Infusions:  . sodium chloride    . sodium chloride    . amiodarone 30 mg/hr (10/04/17 1749)  . fentaNYL infusion INTRAVENOUS 100 mcg/hr (10/04/17 0600)  . furosemide (LASIX) infusion 8 mg/hr (10/04/17 0600)  . heparin 1,850 Units/hr (10/04/17 0600)  . phenylephrine (NEO-SYNEPHRINE) Adult infusion 10 mcg/min (10/04/17 0600)    Assessment: 70 yom presented to the ED with afib with RVR from cardiologist office. Pharmacy consulted to dose heparin. He has been on xarelto in the past but stopped taking with ongoing hemoptysis. Pt failed DCCV 7/19 and went into rapid AFib RVR 7/23 pm requiring intubation.  Heparin held during rapid response but resumed ~midnight, heparin level therapeutic this morning and CBC stable.   Goal of Therapy:  Heparin level 0.3-0.7 units/ml Monitor platelets by anticoagulation protocol: Yes   Plan:  -Continue heparin 1850 units/hr. -Next heparin level with AM labs. -Monitor daily heparin level and CBC, s/sx bleeding  Arrie Senate, PharmD, BCPS Clinical Pharmacist [phone] Please check AMION for all [place] numbers 10/04/2017

## 2017-10-04 NOTE — Progress Notes (Signed)
CRITICAL VALUE ALERT  Critical Value:  Troponin 0.04  Date & Time Notied:  10/04/2017 0030  Provider Notified: E link MD  Orders Received/Actions taken: No new orders given.

## 2017-10-04 NOTE — Consult Note (Addendum)
Advanced Heart Failure Team Consult Note   Primary Physician: [name], MD PCP-Cardiologist:  Sinclair Grooms, MD  Reason for Consultation: Heart Failure   HPI:    Logan [name] is seen today for evaluation of heart failure at the request of Dr Gwenlyn Found.   [name] is 67 year old with history of HTN, A Fib, OSA, untreated prostate cancer 2012 , RLL mass 2015 declined treatment, hemoptysis for the last 6 months (he stopped xarelto), and chronic systolic heart failure.   Earlier this month he was evaluated at [facility] for increased dyspnea. Refused admit. On 7/16 he went to see his cardiologist and he was in A fib RVR. He sent to [facility] for admit.   Admitted with A/C systolic heart failure and A fib RVR.  Placed on amio drip and heparin drip. CCM consulted for lung mass and hemoptysis. Underwent thoracentesis on 09/27/17.  ECHO completed on admit and showed biventricular HF. Cardiology consulted. Underwent TEE on 7/19 with EF 15% and smoke noted in left atrium so DC-CV was no pursued. On 7/22 he underwent liver biopsy. Over the last 24 hours he has been hypotensive so diuresis was limited and amiodarone was stopped. He then developed recurrent A fib RVR with flash pulmonary edema. Amiodarone was restarted. He became unresponsive and required intubation last night. He was started on Neosynephrine and lasix drip at 8 mg per hour.    Blood CX - pending.   Procedures S/P thoracentesis 7/17 with 700 cc removed and liver biopsy 10/02/17.  S/P TEE LVEF 15% RV dilated moderate HK and smoke in left atrium.  Intubation 10/03/2017   RHC/LHC  1. Mid LAD to Dist LAD lesion, 20% stenosed. 2. Mid RCA to Dist RCA lesion, 20% stenosed. 3. There is moderate to severe left ventricular systolic dysfunction.   Widely patent coronary arteries  Severe global hypokinesis with EF 25%. Elevated left ventricular end-diastolic pressure  Normal pulmonary artery pressures.  09/26/2017 CT  chest without contrast >>  1. Large RIGHT lower lobe mass with central and peripheral calcifications occupies the near entirety of the RIGHT lower lobe and concerning for neoplasm. 2. Moderate RIGHT effusion. 3. Mild mediastinal and RIGHT hilar adenopathy. 4. Recommend FDG PET scan for biopsy planning/staging  Echo 09/26/2017  Left ventricle: The cavity size was moderately dilated. Wall   thickness was normal. There was adverse spherical remodeling. The   estimated ejection fraction was in the range of 10% to 15%.   Severe diffuse hypokinesis with no identifiable regional   variations. - Mitral valve: There was mild to moderate regurgitation directed   posteriorly. - Left atrium: The atrium was moderately dilated. - Right ventricle: The cavity size was severely dilated. Systolic   function was severely reduced. - Right atrium: The atrium was moderately dilated. - Pericardium, extracardiac: A small pericardial effusion was   identified along the right atrial free wall. There was no   evidence of hemodynamic compromise.  Review of Systems: [y] = yes, '[ ]'$  = no Patient is encephalopathic and or intubated. Therefore history has been obtained from chart review.   General: Weight gain '[ ]'$ ; Weight loss '[ ]'$ ; Anorexia '[ ]'$ ; Fatigue '[ ]'$ ; Fever '[ ]'$ ; Chills '[ ]'$ ; Weakness '[ ]'$   Cardiac: Chest pain/pressure '[ ]'$ ; Resting SOB '[ ]'$ ; Exertional SOB '[ ]'$ ; Orthopnea '[ ]'$ ; Pedal Edema '[ ]'$ ; Palpitations '[ ]'$ ; Syncope '[ ]'$ ; Presyncope '[ ]'$ ; Paroxysmal nocturnal dyspnea'[ ]'$   Pulmonary: Cough '[ ]'$ ;  Wheezing'[ ]'$ ; Hemoptysis'[ ]'$ ; Sputum '[ ]'$ ; Snoring '[ ]'$   GI: Vomiting'[ ]'$ ; Dysphagia'[ ]'$ ; Melena'[ ]'$ ; Hematochezia '[ ]'$ ; Heartburn'[ ]'$ ; Abdominal pain '[ ]'$ ; Constipation '[ ]'$ ; Diarrhea '[ ]'$ ; BRBPR '[ ]'$   GU: Hematuria'[ ]'$ ; Dysuria '[ ]'$ ; Nocturia'[ ]'$   Vascular: Pain in legs with walking '[ ]'$ ; Pain in feet with lying flat '[ ]'$ ; Non-healing sores '[ ]'$ ; Stroke '[ ]'$ ; TIA '[ ]'$ ; Slurred speech '[ ]'$ ;  Neuro: Headaches'[ ]'$ ; Vertigo'[ ]'$ ; Seizures'[ ]'$ ;  Paresthesias'[ ]'$ ;Blurred vision '[ ]'$ ; Diplopia '[ ]'$ ; Vision changes '[ ]'$   Ortho/Skin: Arthritis '[ ]'$ ; Joint pain '[ ]'$ ; Muscle pain '[ ]'$ ; Joint swelling '[ ]'$ ; Back Pain '[ ]'$ ; Rash '[ ]'$   Psych: Depression'[ ]'$ ; Anxiety'[ ]'$   Heme: Bleeding problems '[ ]'$ ; Clotting disorders '[ ]'$ ; Anemia '[ ]'$   Endocrine: Diabetes '[ ]'$ ; Thyroid dysfunction'[ ]'$   Home Medications Prior to Admission medications   Medication Sig Start Date End Date Taking? Authorizing Provider  aspirin 81 MG tablet Take 81 mg by mouth daily.   Yes [provider]  carvedilol (COREG) 12.5 MG tablet Take 1 tablet (12.5 mg total) by mouth 2 (two) times daily. 05/15/17  Yes Burtis Junes, NP  Cholecalciferol ([employer] VIT D 5000 HIGH-POTENCY) 5000 units capsule Take 5,000 Units by mouth daily.   Yes [provider]  furosemide (LASIX) 40 MG tablet Take 1 tablet (40 mg total) by mouth daily. 05/15/17  Yes Burtis Junes, NP  Multiple Vitamin (MULTIVITAMIN) tablet Take 1 tablet by mouth daily.   Yes [provider]  sacubitril-valsartan (ENTRESTO) 24-26 MG Take 1 tablet by mouth 2 (two) times daily.   Yes [provider]    Past Medical History: Past Medical History:  Diagnosis Date  . CHF (congestive heart failure) ([place])   . Chronic systolic heart failure ([place]) 04/15/2015    EF 30-35% by echo 2016 , [facility]   . Hypertension   . Lung mass 04/15/2015    Never biopsied. History of PET scan that raised concern.   . Metabolic syndrome 06/14/3293  . OSA on CPAP   . Paroxysmal atrial fibrillation ([place]) 04/15/2015   On apixaban   . Prostate cancer ([place])   . Thrombocytopenia ([place]) 04/15/2015    Past Surgical History: Past Surgical History:  Procedure Laterality Date  . CARDIAC CATHETERIZATION N/A 04/27/2015   Procedure: Right/Left Heart Cath and Coronary Angiography;  Surgeon: [name], MD;  Location: [facility];  Service: Cardiovascular;  Laterality: N/A;  . CARDIOVERSION N/A 09/29/2017   Procedure: CARDIOVERSION;   Surgeon: Pixie Casino, MD;  Location: [facility];  Service: Cardiovascular;  Laterality: N/A;  . IR THORACENTESIS ASP PLEURAL SPACE W/IMG GUIDE  09/27/2017  . PROSTATE BIOPSY    . TEE WITHOUT CARDIOVERSION N/A 09/29/2017   Procedure: TRANSESOPHAGEAL ECHOCARDIOGRAM (TEE);  Surgeon: Pixie Casino, MD;  Location: [facility] ENDOSCOPY;  Service: Cardiovascular;  Laterality: N/A;    Family History: Family History  Problem Relation Age of Onset  . Parkinson's disease Mother   . Hypertension Father     Social History: Social History   Socioeconomic History  . Marital status: Married    Spouse name: Not on file  . Number of children: Not on file  . Years of education: Not on file  . Highest education level: Not on file  Occupational History  . Not on file  Social Needs  . Financial resource strain: Not on file  . Food insecurity:  Worry: Not on file    Inability: Not on file  . Transportation needs:    Medical: Not on file    Non-medical: Not on file  Tobacco Use  . Smoking status: Former Smoker    Years: 6.50    Types: Cigarettes, Cigars, Pipe    Last attempt to quit: 03/14/1974    Years since quitting: 43.5  . Smokeless tobacco: Never Used  Substance and Sexual Activity  . Alcohol use: Yes    Alcohol/week: 0.0 oz    Comment: 09/26/2017 "holidays only"  . Drug use: Never  . Sexual activity: Yes  Lifestyle  . Physical activity:    Days per week: Not on file    Minutes per session: Not on file  . Stress: Not on file  Relationships  . Social connections:    Talks on phone: Not on file    Gets together: Not on file    Attends religious service: Not on file    Active member of club or organization: Not on file    Attends meetings of clubs or organizations: Not on file    Relationship status: Not on file  Other Topics Concern  . Not on file  Social History Narrative  . Not on file    Allergies:  No Known Allergies  Objective:    Vital Signs:   Temp:  [98.2 F  (36.8 C)-98.6 F (37 C)] 98.5 F (36.9 C) (07/24 0742) Pulse Rate:  [71-130] 73 (07/24 0742) Resp:  [22-34] 24 (07/24 0742) BP: (71-134)/(53-89) 96/75 (07/24 0742) SpO2:  [81 %-100 %] 100 % (07/24 0742) FiO2 (%):  [50 %-100 %] 50 % (07/24 0742) Weight:  [237 lb 10.5 oz (107.8 kg)] 237 lb 10.5 oz (107.8 kg) (07/24 0500) Last BM Date: 10/03/17  Weight change: Filed Weights   10/03/17 0452 10/03/17 2300 10/04/17 0500  Weight: 233 lb 4.8 oz (105.8 kg) 237 lb 10.5 oz (107.8 kg) 237 lb 10.5 oz (107.8 kg)    Intake/Output:   Intake/Output Summary (Last 24 hours) at 10/04/2017 0842 Last data filed at 10/04/2017 0649 Gross per 24 hour  Intake 817.33 ml  Output 875 ml  Net -57.67 ml      Physical Exam    General:  Intubated HEENT: ETT Neck: supple. JVP elevated . Carotids 2+ bilat; no bruits. No lymphadenopathy or thyromegaly appreciated. Cor: PMI nondisplaced. Irregular rate & rhythm. No rubs, gallops or murmurs. Lungs: rhonchi throughout Abdomen: soft, nontender, nondistended. No hepatosplenomegaly. No bruits or masses. Good bowel sounds. Extremities: no cyanosis, clubbing, rash, edema Neuro: intubated. Awake MAE and follows commands.    Telemetry   NSR 70s   EKG     A Flutters 96 bpm   Labs   Basic Metabolic Panel: Recent Labs  Lab 10/01/17 0307 10/02/17 0453 10/03/17 0647 10/03/17 2347 10/04/17 0512  NA 139 141 139 140 141  K 3.9 3.5 3.8 4.9 3.9  CL 97* 97* 99 97* 101  CO2 33* 34* '31 30 29  '$ GLUCOSE 102* 106* 128* 238* 124*  BUN '17 13 12 16 15  '$ CREATININE 1.21 1.07 1.04 1.55* 1.56*  CALCIUM 8.6* 8.8* 8.8* 8.8* 8.5*  MG  --   --   --  2.2 2.0  PHOS  --   --   --  6.8* 4.3    Liver Function Tests: No results for input(s): AST, ALT, ALKPHOS, BILITOT, PROT, ALBUMIN in the last 168 hours. No results for input(s): LIPASE, AMYLASE in the last 168  hours. No results for input(s): AMMONIA in the last 168 hours.  CBC: Recent Labs  Lab 10/01/17 0307  10/02/17 0453 10/03/17 0647 10/03/17 2347 10/04/17 0512  WBC 7.4 6.6 8.5 11.5* 10.0  NEUTROABS  --   --   --  9.2*  --   HGB 10.7* 10.7* 11.0* 11.5* 10.3*  HCT 32.3* 32.4* 33.8* 37.2* 32.0*  MCV 90.2 90.3 91.1 94.7 92.2  PLT 237 234 234 295 227    Cardiac Enzymes: Recent Labs  Lab 10/03/17 2347 10/04/17 0512  TROPONINI 0.04* 0.04*    BNP: BNP (last 3 results) Recent Labs    09/26/17 0955 10/03/17 2347  BNP 537.4* 362.5*    ProBNP (last 3 results) No results for input(s): PROBNP in the last 8760 hours.   CBG: Recent Labs  Lab 10/04/17 0135 10/04/17 0337 10/04/17 0827  GLUCAP 180* 125* 128*    Coagulation Studies: No results for input(s): LABPROT, INR in the last 72 hours.   Imaging   Dg Chest Port 1 View  Result Date: 10/04/2017 CLINICAL DATA:  Respiratory failure EXAM: PORTABLE CHEST 1 VIEW COMPARISON:  10/03/2017 FINDINGS: NG tube and endotracheal tube are unchanged. Cardiomegaly. Bilateral airspace opacities are again noted, right greater than left, most confluent in the right lung base. Mild vascular congestion. IMPRESSION: Stable cardiomegaly and vascular congestion. Stable bilateral airspace opacities, likely edema. More confluent infiltrate in the right lung base, cannot exclude pneumonia. Electronically Signed   By: Rolm Baptise M.D.   On: 10/04/2017 07:18   Dg Chest Port 1 View  Result Date: 10/03/2017 CLINICAL DATA:  Respiratory failure EXAM: PORTABLE CHEST 1 VIEW COMPARISON:  09/27/2017, 09/26/2017 FINDINGS: Endotracheal tube tip is about 5 cm superior to the carina. Esophageal tube tip is below the diaphragm, tip is in the left upper quadrant. Cardiomegaly with vascular congestion and pulmonary edema. Confluent airspace disease at the right greater than left lung bases. IMPRESSION: 1. Endotracheal tube tip about 5 cm superior to the carina. 2. Cardiomegaly with vascular congestion and pulmonary edema. 3. Right greater than left basilar confluent  airspace disease, on the right, this corresponds to previously noted lung mass and pleural fluid Electronically Signed   By: Donavan Foil M.D.   On: 10/03/2017 23:34      Medications:     Current Medications: . aspirin EC  81 mg Oral Daily  . carvedilol  3.125 mg Oral BID WC  . chlorhexidine gluconate (MEDLINE KIT)  15 mL Mouth Rinse BID  . famotidine  20 mg Per Tube BID  . insulin aspart  1-3 Units Subcutaneous Q4H  . mouth rinse  15 mL Mouth Rinse 10 times per day  . sodium chloride flush  3 mL Intravenous Q12H     Infusions: . sodium chloride    . sodium chloride    . amiodarone 30 mg/hr (10/04/17 8295)  . fentaNYL infusion INTRAVENOUS 100 mcg/hr (10/04/17 0600)  . furosemide (LASIX) infusion 8 mg/hr (10/04/17 0600)  . heparin 1,850 Units/hr (10/04/17 0600)  . phenylephrine (NEO-SYNEPHRINE) Adult infusion 10 mcg/min (10/04/17 0600)       Patient Profile    [name] is 67 year old with history of HTN, A Fib, OSA, untreated prostate cancer 2012 , RLL mass 2015 declined treatment, hemoptysis for the last 6 months (he stopped xarelto), and chronic systolic heart failure.   Admitted with A fib RVR and A/C systolic heart failure.   Assessment/Plan   1. A/C Biventricular Heart Failure NICM . Most recent  LHC 04/2015 patent coronaries. - ECHO 08/28/17 EF 10-15%  -Will need PICC. After PICC placed set UP CVP. Check CO-OX  CXR with vascular congestion.  On Neo with biventricular HF would switch to norepi.  Increase lasix drip to 10 mg per hour.  Stop carvedilol.   2. A fib RVR TEE 7/22 with smoke  Amio drip restarted and he has converted Continue amio 30 mg per hour.  Continue heparin drip Stop aspirin  3. Acute Hypoxic Respiratory Failure Intubated 10/03/17  4. Hemoptysis 6 months ago he stopped xarelto and hemoptysis resolved.  Blood noted in ETT.  Currently on heparin for A fib.  hgb 11/5>10.3   5. AKI Creatinine trending up. 1.0>1.5  Follow daily BMET    6. Lung Mass Concern for metastatic ca.  S/P Thoracentesis 7/17  7. Liver Mass S/P Liver Bx 10/02/2017  8. H/O Prostate Cancer 2012    Length of Stay: [place], NP  10/04/2017, 8:42 AM  Advanced Heart Failure Team Pager [phone] (M-F; 7a - 4p)  Please contact [facility] for night-coverage after hours (4p -7a ) and weekends on [email or web address]   Patient seen with NP, agree with the above note.    He is currently intubated but awakens and can follow commands. Currently on phenylephrine 14, amiodarone gtt, Lasix gtt 8 mg/hr, heparin gtt.  No evidence for hemoptysis so far.    This morning, he went back into NSR in 70s.  SBP 90s-100s.   On exam, he awakens and follows commands.  JVP difficult.  1+ ankle edema.  Rhonchi bilaterally.  Reg S1S2.   1. Acute on chronic systolic CHF:  TEE 7/03 with EF 15%.  Long history of nonischemic cardiomyopathy.  No ICD.  He was admitted with afib/RVR and dyspnea.  Failed TEE-guided DCCV and intubated likely due to pulmonary edema.  Exam difficult for volume but CXR with pulmonary edema.  MAP stable this morning on phenylephrine and back in NSR on amiodarone gtt.  - Increase Lasix to 10 mg/hr for now.  - Needs PICC line, follow CVP/co-ox off PICC (place today).  - Stop phenylephrine and will start norepinephrine gtt.   - Continue amiodarone gtt to maintain NSR.   - I suspect that his long-term prognosis with combination of suspected metastatic lung cancer and severe CHF (possibly low output) is very poor.  He is not a candidate for advanced cardiac therapies.  2. Lung mass: RLL mass concerning for lung cancer with liver mets.  He is s/p liver biopsy, pending result.  3. Hemoptysis: Likely from lung mass.  Not active.  He was off anticoagulation at admission because of this but is now back on heparin gtt and stable.  4. Atrial fibrillation: Initially with RVR, likely was driving CHF.  He failed DCCV but then with addition amiodarone has gone back in  NSR this morning.  - Continue amiodarone gtt for the time being.  - heparin gtt for now, eventual transition to Eliquis.  5. AKI: Creatinine rose to 1.5 but now stable.  Follow with diuresis.  6. H/o elevated PSA: Declined workup.  7. Acute hypoxemic respiratory failure: Suspect due to pulmonary edema.  No evidence for active infection.  Diurese.   Loralie Champagne 10/04/2017 10:40 AM

## 2017-10-04 NOTE — Care Management Note (Signed)
Case Management Note Marvetta Gibbons RN, BSN Unit 4E-Case Manager [phone]  Patient Details  Name: Logan [name] MRN: [id number] Date of Birth: 06/20/1950  Subjective/Objective:   Pt admitted with afib s/p cardiov. Unsuccessful. Acute on chronic HF. Liver biopsy done on 7.22 for left adrenal nodule hx prostate cancer with RLL mass.                  Action/Plan: PTA pt lived at home with spouse, CM following for transition of care needs.   Expected Discharge Date:                  Expected Discharge Plan:     In-House Referral:     Discharge planning Services  CM Consult  Post Acute Care Choice:    Choice offered to:     DME Arranged:    DME Agency:     HH Arranged:    HH Agency:     Status of Service:  In process, will continue to follow  If discussed at Long Length of Stay Meetings, dates discussed:    Discharge Disposition:   Additional Comments:  10/04/17- 1100- Santiana Glidden RN, CM- pt tx to ICU last night after becoming unresponsive and requiring intubation for flash pulm. Edema. Currently on Vent  Dawayne Patricia, [place] 10/04/2017, 11:21 AM

## 2017-10-04 NOTE — Progress Notes (Signed)
PULMONARY / CRITICAL CARE MEDICINE   Name: Logan [name] MRN: [id number] DOB: 1950/03/18    ADMISSION DATE:  09/26/2017  HISTORY OF PRESENT ILLNESS:   15yoM with hx CHF (EF 25-30%), HTN, Afib (on ASA as outpatient, had hemoptysis when on Xarelto), OSA on CPAP, Prostate cancer, and hypermetabolic RLL mass with left adrenal nodule (diagnosed on PET scan 03/2015), admitted with CHF exacerbation and Afib with RVR. Since then he has undergone thoracentesis 7/17 and liver biopsy 7/22. Patient has been hypotensive which has limited ability to diurese him. Today his amiodarone infusion was stopped as there was concern it was causing hypotension. Then this evening patient went into Afib with RVR and Flash pulmonary edema. He then became unresponsive due to acute hypercapnea. Cardiology and Anesthesiology at bedside bagging patient. [facility]'s code pager was activated (although patient never lost a pulse) and [facility] asked PCCM ground team to report emergently to bedside. Cardiology gave history as listed above. Following intubation, patient transferred to ICU under PCCM service.    7/24 The patient developed rapid Afib with RVR and went intoi flash pulmonary edema. He had to be intubated and moved to the ICU. He is intubated presently. He is hemodynamically stable at present but is on a small dose of neosynephrine. He is on [place] Amidoarone drip.  PAST MEDICAL HISTORY :  He  has a past medical history of CHF (congestive heart failure) ([place]), Chronic systolic heart failure ([place]) (04/15/2015), Hypertension, Lung mass (10/25/4816), Metabolic syndrome (07/18/3147), OSA on CPAP, Paroxysmal atrial fibrillation ([place]) (04/15/2015), Prostate cancer ([place]), and Thrombocytopenia ([place]) (04/15/2015).  PAST SURGICAL HISTORY: He  has a past surgical history that includes Prostate biopsy; Cardiac catheterization (N/A, 04/27/2015); IR THORACENTESIS ASP PLEURAL SPACE W/IMG GUIDE (09/27/2017); TEE without cardioversion (N/A, 09/29/2017);  and Cardioversion (N/A, 09/29/2017).  No Known Allergies  No current facility-administered medications on file prior to encounter.    Current Outpatient Medications on File Prior to Encounter  Medication Sig  . aspirin 81 MG tablet Take 81 mg by mouth daily.  . carvedilol (COREG) 12.5 MG tablet Take 1 tablet (12.5 mg total) by mouth 2 (two) times daily.  . Cholecalciferol ([employer] VIT D 5000 HIGH-POTENCY) 5000 units capsule Take 5,000 Units by mouth daily.  . furosemide (LASIX) 40 MG tablet Take 1 tablet (40 mg total) by mouth daily.  . Multiple Vitamin (MULTIVITAMIN) tablet Take 1 tablet by mouth daily.  . sacubitril-valsartan (ENTRESTO) 24-26 MG Take 1 tablet by mouth 2 (two) times daily.    FAMILY HISTORY:  His family history includes Hypertension in his father; Parkinson's disease in his mother.  SOCIAL HISTORY: He  reports that he quit smoking about 43 years ago. His smoking use included cigarettes, cigars, and pipe. He quit after 6.50 years of use. He has never used smokeless tobacco. He reports that he drinks alcohol. He reports that he does not use drugs.  REVIEW OF SYSTEMS:   Review of Systems  Unable to perform ROS: Critical illness   SUBJECTIVE:  Intubated, Sedated   VITAL SIGNS: BP 96/75   Pulse 73   Temp 98.2 F (36.8 C) (Axillary)   Resp (!) 24   Ht _0  (1.803 m)   Wt 237 lb 10.5 oz (107.8 kg)   SpO2 100%   BMI 33.15 kg/m   HEMODYNAMICS:  Normotensive   VENTILATOR SETTINGS: Vent Mode: PRVC FiO2 (%):  [50 %-100 %] 50 % Set Rate:  [20 bmp-24 bmp] 24 bmp Vt Set:  [600 mL] 600 mL  PEEP:  [8 cmH20] 8 cmH20 Plateau Pressure:  [30 cmH20-37 cmH20] 30 cmH20  INTAKE / OUTPUT: I/O last 3 completed shifts: In: 1102.6 [I.V.:1102.6] Out: 1105 [Urine:1105]  PHYSICAL EXAMINATION: General: WDWN Adult male, Intubated, Unresponsive, Critically ill Neuro: PERRL, Unresponsive to sternal rub, Cough to ETT suction HEENT: Orally intubated, OP clear, MM moist   Cardiovascular: Irreg irreg with HR in 120's Lungs: Coarse rhonchi bilaterally; Bloody appearing pulmonary edema in ETT Abdomen: Obese soft NTND Musculoskeletal: 1+ BLE edema  Skin: no rashes   LABS:  BMET Recent Labs  Lab 10/03/17 0647 10/03/17 2347 10/04/17 0512  NA 139 140 141  K 3.8 4.9 3.9  CL 99 97* 101  CO2 _0 BUN _1 CREATININE 1.04 1.55* 1.56*  GLUCOSE 128* 238* 124*   Electrolytes Recent Labs  Lab 10/03/17 0647 10/03/17 2347 10/04/17 0512  CALCIUM 8.8* 8.8* 8.5*  MG  --  2.2 2.0  PHOS  --  6.8* 4.3   CBC Recent Labs  Lab 10/03/17 0647 10/03/17 2347 10/04/17 0512  WBC 8.5 11.5* 10.0  HGB 11.0* 11.5* 10.3*  HCT 33.8* 37.2* 32.0*  PLT 234 295 227   Coag's Recent Labs  Lab 09/29/17 0434  INR 1.28   Sepsis Markers Recent Labs  Lab 10/03/17 2347 10/04/17 0211  LATICACIDVEN 3.9* 3.1*  PROCALCITON <0.10  --     ABG Recent Labs  Lab 10/03/17 2312 10/04/17 0032 10/04/17 0435  PHART 7.103* 7.364 7.422  PCO2ART 103* 52.1* 46.7  PO2ART 117* 117* 111*    Liver Enzymes No results for input(s): AST, ALT, ALKPHOS, BILITOT, ALBUMIN in the last 168 hours.  Cardiac Enzymes Recent Labs  Lab 10/03/17 2347 10/04/17 0512  TROPONINI 0.04* 0.04*    Glucose Recent Labs  Lab 10/04/17 0135 10/04/17 0337  GLUCAP 180* 125*    Imaging Dg Chest Port 1 View  Result Date: 10/04/2017 CLINICAL DATA:  Respiratory failure EXAM: PORTABLE CHEST 1 VIEW COMPARISON:  10/03/2017 FINDINGS: NG tube and endotracheal tube are unchanged. Cardiomegaly. Bilateral airspace opacities are again noted, right greater than left, most confluent in the right lung base. Mild vascular congestion. IMPRESSION: Stable cardiomegaly and vascular congestion. Stable bilateral airspace opacities, likely edema. More confluent infiltrate in the right lung base, cannot exclude pneumonia. Electronically Signed   By: Rolm Baptise M.D.   On: 10/04/2017 07:18   Dg Chest Port  1 View  Result Date: 10/03/2017 CLINICAL DATA:  Respiratory failure EXAM: PORTABLE CHEST 1 VIEW COMPARISON:  09/27/2017, 09/26/2017 FINDINGS: Endotracheal tube tip is about 5 cm superior to the carina. Esophageal tube tip is below the diaphragm, tip is in the left upper quadrant. Cardiomegaly with vascular congestion and pulmonary edema. Confluent airspace disease at the right greater than left lung bases. IMPRESSION: 1. Endotracheal tube tip about 5 cm superior to the carina. 2. Cardiomegaly with vascular congestion and pulmonary edema. 3. Right greater than left basilar confluent airspace disease, on the right, this corresponds to previously noted lung mass and pleural fluid Electronically Signed   By: Donavan Foil M.D.   On: 10/03/2017 23:34    STUDIES:  CTA PE 03/2014 >>  neg for PE; A large right infrahilar soft tissue mass is identified measuring 7.7 x 6.1 cm in transaxial dimensions by 5.8 cm in craniocaudal height. This demonstrates heterogeneous enhancement. Mild diffuse groundglass densities are identified throughout the lungs;  mildly enlarged precarinal lymph node measuring 1.4 cm in short axis.  PET 04/07/2015  >>  1.  Hypermetabolic right lower lobe mass max SUV 13.2 measuring 6.2 x 7.9 cm (image128). The mass previously measured 6.1 x 7.7 cm 2.  12 mm hypermetabolic left adrenal nodule (image 155). 3.  No other worrisome hypermetabolic activity is identified. 4.  No hypermetabolic 4 mm subpleural right upper lobe nodule (image 24) 2 small            accurately characterize by PET.  09/26/2017 CT chest without contrast >>  1. Large RIGHT lower lobe mass with central and peripheral calcifications occupies the near entirety of the RIGHT lower lobe and concerning for neoplasm. 2. Moderate RIGHT effusion. 3. Mild mediastinal and RIGHT hilar adenopathy. 4. Recommend FDG PET scan for biopsy planning/staging  7/17 CT abd and pelvis >>  1. Large complex masses in the right lower lobe of  the lung and in the right hepatic lobe. Smaller liver lesions in the left hepatic lobe. Nonspecific lesions in the spleen and kidneys. No significant pathologic adenopathy. This would not be a characteristic metastatic pattern for prostate cancer and is not a specific metastatic and imaging pattern to suggest an individual etiology. Possibilities might include metastatic lung cancer, melanoma, metastatic hepatocellular carcinoma, or a variety of other possibilities. Tissue diagnosis recommended. 2. Moderate prostatomegaly. 3. Scattered mild ascites. 4. Moderate cardiomegaly. Small inferior pericardial effusion. 5. Trace bilateral pleural effusions  CULTURES: Sputum culture: ordered   ANTIBIOTICS: None   SIGNIFICANT EVENTS: 7/16 Admit to cardiology  7/23: Afib with RVR >> Flash pulm edema >> Respiratory failure requiring intubation  LINES/TUBES: PIV's Foley catheter 7/23>> ETT 7/23>> OG tube 7/23>>  DISCUSSION: 66yoM with hx CHF (EF 25-30%), HTN, Afib (on ASA as outpatient, had hemoptysis when on Xarelto), OSA on CPAP, Prostate cancer, and hypermetabolic RLL mass with left adrenal nodule (diagnosed on PET scan 03/2015), admitted with CHF exacerbation and Afib with RVR. Developed Afib with RVR and Flash pulmonary edema on 7/23 PM requiring intubation.   ASSESSMENT / PLAN:  PULMONARY 1. Acute Hypoxic and Hypercapneic Respiratory failure; Flash pulmonary edema - reviewed ABG showing severe acute-on-chronic hypercapnea (7.10/103/117/30); increased RR from 20 to 24 on vent. Repeat ABG in 1 hour - received 68m Lasix IV during intubation; will start lasix infusion - CXR on my review shows diffuse bilateral pulmonary edema 2. RLL Mass: hypermetabolic on PET scan, likely malignant in nature. Biopsy of liver met pending.  3. Right pleural effusion: s/p thoracentesis 7/17 with cytology showing reactive mesothelial cells 7/24 The patient is presently intubated and sedated. He is on full  vent support. He is on 40% FI02 and PEEP 5 No plan on weaning patient today. Have not seen result onpleural fluid cytology yet.  CARDIOVASCULAR 1. Afib with RVR; Acute systolic CHF exacerbation: - unsuccessful cardioversion on 7/19, returning to Afib following procedure.   - continue amiodarone infusion - Echo now shows EF 10-15% from baseline of 25%, likely worsening EF due to the Afib RVR.  - has remained fluid overloaded but difficult to diurese given hypotension. - currently normotensive following intubation; continue diuresis as BP tolerates. Hold the EMenloso that we have room in his BP to diurese.  - continue heparin infusion - check BMP and Mg now  - check cortisol level given that he has adrenal mets and has had soft BP's this admission; may be adrenally insufficient.  7/24 The patient is back in NSR. He is on a amiodarone drip. Potassium and Mag are in the normal range.   RENAL 1. AKI: - creatinine  1.3 (on ISTAT) up from baseline of 1.04, likely cardiorenal from volume overload - place foley catheter; continue diuresis as BP and renal function tolerate. Monitor UOP closely. Avoid nephrotoxic agents. Creatinine is 1.55 today. Urine output was 875 in the past 24 hours.  GASTROINTESTINAL 1. Anemia: - Hgb 11.0 which is at his baseline of 10-11; continue monitoring.   HEMATOLOGIC 1. 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 Initial concern was for a malignant pleural effusion as well, although cytology was negative. Liver biopsy on 7/22 is still pending. Although he has a history of prostate cancer, it would be unusual for it to metastasize to the lung in this fashion.   INFECTIOUS No active issues   ENDOCRINE 1. Hyperglycemia: no hx of DM - start SSI q4hrs  NEUROLOGIC Patient presently sedated on the ventialtor   FAMILY  - Updated patient's wife at bedside  -  Inter-disciplinary family meet or Palliative Care meeting due by: 10/09/17   60 minutes nonprocedural critical care time  Micheal Likens MD Pulmonary and [place] Pager: [phone]  10/04/2017, 8:25 AM

## 2017-10-04 NOTE — Progress Notes (Signed)
CRITICAL VALUE ALERT  Critical Value:  3.9 lactic acid  Date & Time Notied:  10/04/17 0030  Provider Notified: E link MD   Orders Received/Actions taken: No new orders.

## 2017-10-04 NOTE — Progress Notes (Signed)
Logan [name]   DOB:1951-02-14   QZ#:[id number]   MAU#:[id number]  Oncology follow up note   Subjective: I saw pt and his wife in ICU and discussed his liver biopsy results. He was intubated yesterday for pulmonary edema secondary to rapid AF. He is currently on pressor, intubated, but awake and responds to simple questions.    Objective:  Vitals:   10/04/17 2000 10/04/17 2013  BP: 100/74   Pulse: (!) 109   Resp: (!) 28   Temp:  99.7 F (37.6 C)  SpO2: 100%     Body mass index is 33.15 kg/m.  Intake/Output Summary (Last 24 hours) at 10/04/2017 2017 Last data filed at 10/04/2017 1800 Gross per 24 hour  Intake 1406.84 ml  Output 1195 ml  Net 211.84 ml     Sclerae unicteric  Lungs rhonchi throughout  Heart regular rate and rhythm  Abdomen benign  MSK no focal spinal tenderness, no peripheral edema  Neuro nonfocal   CBG (last 3)  Recent Labs    10/04/17 0827 10/04/17 1226 10/04/17 1702  GLUCAP 128* 125* 116*     Labs:  Lab Results  Component Value Date   WBC 10.0 10/04/2017   HGB 10.3 (L) 10/04/2017   HCT 32.0 (L) 10/04/2017   MCV 92.2 10/04/2017   PLT 227 10/04/2017   NEUTROABS 9.2 (H) 10/03/2017   CMP Latest Ref Rng & Units 10/04/2017 10/03/2017 10/03/2017  Glucose 70 - 99 mg/dL 124(H) 238(H) 128(H)  BUN 8 - 23 mg/dL '15 16 12  '$ Creatinine 0.61 - 1.24 mg/dL 1.56(H) 1.55(H) 1.04  Sodium 135 - 145 mmol/L 141 140 139  Potassium 3.5 - 5.1 mmol/L 3.9 4.9 3.8  Chloride 98 - 111 mmol/L 101 97(L) 99  CO2 22 - 32 mmol/L '29 30 31  '$ Calcium 8.9 - 10.3 mg/dL 8.5(L) 8.8(L) 8.8(L)  Total Protein 6.5 - 8.1 g/dL - - -  Total Bilirubin 0.3 - 1.2 mg/dL - - -  Alkaline Phos 38 - 126 U/L - - -  AST 15 - 41 U/L - - -  ALT 0 - 44 U/L - - -    Urine Studies No results for input(s): UHGB, CRYS in the last 72 hours.  Invalid input(s): UACOL, UAPR, USPG, UPH, UTP, UGL, UKET, UBIL, UNIT, UROB, [place], UEPI, UWBC, Duwayne Heck [place], [place]  Basic Metabolic Panel: Recent  Labs  Lab 10/01/17 [phone] 10/02/17 0453 10/03/17 0647 10/03/17 2347 10/04/17 0512  NA 139 141 139 140 141  K 3.9 3.5 3.8 4.9 3.9  CL 97* 97* 99 97* 101  CO2 33* 34* '31 30 29  '$ GLUCOSE 102* 106* 128* 238* 124*  BUN '17 13 12 16 15  '$ CREATININE 1.21 1.07 1.04 1.55* 1.56*  CALCIUM 8.6* 8.8* 8.8* 8.8* 8.5*  MG  --   --   --  2.2 2.0  PHOS  --   --   --  6.8* 4.3   GFR Estimated Creatinine Clearance: 57.4 mL/min (A) (by C-G formula based on SCr of 1.56 mg/dL (H)). Liver Function Tests: No results for input(s): AST, ALT, ALKPHOS, BILITOT, PROT, ALBUMIN in the last 168 hours. No results for input(s): LIPASE, AMYLASE in the last 168 hours. No results for input(s): AMMONIA in the last 168 hours. Coagulation profile Recent Labs  Lab 09/29/17 0434  INR 1.28    CBC: Recent Labs  Lab 10/01/17 0307 10/02/17 0453 10/03/17 0647 10/03/17 2347 10/04/17 0512  WBC 7.4 6.6 8.5 11.5* 10.0  NEUTROABS  --   --   --  9.2*  --   HGB 10.7* 10.7* 11.0* 11.5* 10.3*  HCT 32.3* 32.4* 33.8* 37.2* 32.0*  MCV 90.2 90.3 91.1 94.7 92.2  PLT 237 234 234 295 227   Cardiac Enzymes: Recent Labs  Lab 10/03/17 2347 10/04/17 0512 10/04/17 1046  TROPONINI 0.04* 0.04* 0.04*   BNP: Invalid input(s): POCBNP CBG: Recent Labs  Lab 10/04/17 0135 10/04/17 0337 10/04/17 0827 10/04/17 1226 10/04/17 1702  GLUCAP 180* 125* 128* 125* 116*   D-Dimer No results for input(s): DDIMER in the last 72 hours. Hgb A1c No results for input(s): HGBA1C in the last 72 hours. Lipid Profile Recent Labs    10/03/17 2347  TRIG 104   Thyroid function studies No results for input(s): TSH, T4TOTAL, T3FREE, THYROIDAB in the last 72 hours.  Invalid input(s): FREET3 Anemia work up No results for input(s): VITAMINB12, FOLATE, FERRITIN, TIBC, IRON, RETICCTPCT in the last 72 hours. Microbiology Recent Results (from the past 240 hour(s))  Culture, respiratory (tracheal aspirate)     Status: None (Preliminary result)    Collection Time: 10/03/17 12:11 AM  Result Value Ref Range Status   Specimen Description TRACHEAL ASPIRATE  Final   Special Requests NONE  Final   Gram Stain   Final    RARE WBC PRESENT, PREDOMINANTLY PMN NO ORGANISMS SEEN Performed at [facility], [address], [place], [place] [address]    Culture PENDING  Incomplete   Report Status PENDING  Incomplete  MRSA PCR Screening     Status: None   Collection Time: 10/04/17  4:32 AM  Result Value Ref Range Status   MRSA by PCR NEGATIVE NEGATIVE Final    Comment:        The GeneXpert MRSA Assay (FDA approved for NASAL specimens only), is one component of a comprehensive MRSA colonization surveillance program. It is not intended to diagnose MRSA infection nor to guide or monitor treatment for MRSA infections. Performed at [facility], [place] [address] [place],  [address]       Studies:  Dg Chest Port 1 View  Result Date: 10/04/2017 CLINICAL DATA:  Respiratory failure EXAM: PORTABLE CHEST 1 VIEW COMPARISON:  10/03/2017 FINDINGS: NG tube and endotracheal tube are unchanged. Cardiomegaly. Bilateral airspace opacities are again noted, right greater than left, most confluent in the right lung base. Mild vascular congestion. IMPRESSION: Stable cardiomegaly and vascular congestion. Stable bilateral airspace opacities, likely edema. More confluent infiltrate in the right lung base, cannot exclude pneumonia. Electronically Signed   By: Rolm Baptise M.D.   On: 10/04/2017 07:18   Dg Chest Port 1 View  Result Date: 10/03/2017 CLINICAL DATA:  Respiratory failure EXAM: PORTABLE CHEST 1 VIEW COMPARISON:  09/27/2017, 09/26/2017 FINDINGS: Endotracheal tube tip is about 5 cm superior to the carina. Esophageal tube tip is below the diaphragm, tip is in the left upper quadrant. Cardiomegaly with vascular congestion and pulmonary edema. Confluent airspace disease at the right greater than left lung bases. IMPRESSION: 1. Endotracheal  tube tip about 5 cm superior to the carina. 2. Cardiomegaly with vascular congestion and pulmonary edema. 3. Right greater than left basilar confluent airspace disease, on the right, this corresponds to previously noted lung mass and pleural fluid Electronically Signed   By: Donavan Foil M.D.   On: 10/03/2017 23:34   [place] Ekg Site Rite  Result Date: 10/04/2017 If Site Rite image not attached, placement could not be confirmed due to current cardiac rhythm.   Assessment: Logan [name] is a  67 y.o. male who presented to the ED on 09/26/17 for worsening SOB and leg swelling. He has a h/o a lung mass since 2015 that has not been evaluated and H/o prostate cancer on observation.    1. 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 Respiratory failure secondary to CHF, on mechanical ventilation 3.  History of untreated prostate cancer, with rising PSA 4.  Bilateral pleural effusion,R>L 5.  Atrial fibrillation, cardiomyopathy with EF 10 to 15%  I have reviewed his liver biopsy results with patient and his wife, it confirmed metastatic adenocarcinoma, immunohistochemistry staining supports lung cancer, on the typical CT scan finding and history of lung mass before liver lesions, this is consistent with metastatic lung adenocarcinoma to liver and local regional lymph nodes.  His lung cancer seems to be relatively slow-growing, the primary tumor was found in 2015. He has little smoking history. I suspect his tumor may contain driving gene mutation such as EGFR. I will order [facility] genomic testing and PD-L1 to see if he is a candidate for immunotherapy or targeted therapy.  Due to his advanced heart failure, he is not a candidate for intensive chemotherapy, which pt's wife agrees.   We discussed overall incurable nature of his disease.  If he can survive from his CHF, his estimated life expendency from his metastatic lung cancer is probably 6-12 months (better than  average stage IV lung cancer due to his indolent nature of his disease).  If he is a candidate for targeted therapy, especially EGFR or ALK inhibitor, his estimated life expectancy from cancer standpoint is probably a few years.  Certainly his advanced heart failure place an important role on his overall prognosis, may even be worse than his lung cancer.   Due to his advanced CHF, it's very reasonable to consider hospice for him, including DNR/DNI. I spoke with his wife, who indicates pt will definitely fight with his cancer, wants to try everything available to him, and wish to remains full code for now.   Recommendations: -I will request molecular testing on his biopsy, to see if he is a candidate for targeted therapy or immunotherapy. The results will be back in 2-3 weeks  -He is not a candidate for chemo  -He needs brain MRI to complete staging but it's not urgent. I do not think he needs lung biopsy.  -I will f/u as needed when he is in the hospital    Truitt Merle, MD 10/04/2017  8:17 PM

## 2017-10-04 NOTE — Progress Notes (Signed)
Peripherally Inserted Central Catheter/Midline Placement  The IV Nurse has discussed with the patient and/or persons authorized to consent for the patient, the purpose of this procedure and the potential benefits and risks involved with this procedure.  The benefits include less needle sticks, lab draws from the catheter, and the patient may be discharged home with the catheter. Risks include, but not limited to, infection, bleeding, blood clot (thrombus formation), and puncture of an artery; nerve damage and irregular heartbeat and possibility to perform a PICC exchange if needed/ordered by physician.  Alternatives to this procedure were also discussed.  [facility] PICC patient education guide, fact sheet on infection prevention and patient information card has been provided to patient /or left at bedside.    PICC/Midline Placement Documentation  PICC Triple Lumen 10/04/17 PICC Right Brachial 42 cm (Active)  Site Assessment Clean;Dry;Intact 10/04/2017  4:32 PM  Lumen #1 Status Flushed;Saline locked;Blood return noted 10/04/2017  4:32 PM  Lumen #2 Status Saline locked;Flushed;Blood return noted 10/04/2017  4:32 PM  Lumen #3 Status Saline locked;Blood return noted;Flushed 10/04/2017  4:32 PM  Dressing Type Transparent;Securing device 10/04/2017  4:32 PM  Dressing Status Clean;Dry;Intact;Antimicrobial disc in place 10/04/2017  4:32 PM  Dressing Change Due 10/11/17 10/04/2017  4:32 PM       Logan [name] 10/04/2017, 4:34 PM

## 2017-10-04 NOTE — Progress Notes (Signed)
Patient hypotensive on propofol infusion; will switch to fentanyl infusion.

## 2017-10-05 ENCOUNTER — Inpatient Hospital Stay (HOSPITAL_COMMUNITY): Payer: 59

## 2017-10-05 LAB — CBC
HCT: 31 % — ABNORMAL LOW (ref 39.0–52.0)
Hemoglobin: 10.3 g/dL — ABNORMAL LOW (ref 13.0–17.0)
MCH: 29.9 pg (ref 26.0–34.0)
MCHC: 33.2 g/dL (ref 30.0–36.0)
MCV: 89.9 fL (ref 78.0–100.0)
PLATELETS: 216 10*3/uL (ref 150–400)
RBC: 3.45 MIL/uL — ABNORMAL LOW (ref 4.22–5.81)
RDW: 14.5 % (ref 11.5–15.5)
WBC: 10.6 10*3/uL — ABNORMAL HIGH (ref 4.0–10.5)

## 2017-10-05 LAB — BASIC METABOLIC PANEL
ANION GAP: 12 (ref 5–15)
BUN: 16 mg/dL (ref 8–23)
CHLORIDE: 97 mmol/L — AB (ref 98–111)
CO2: 27 mmol/L (ref 22–32)
Calcium: 8.1 mg/dL — ABNORMAL LOW (ref 8.9–10.3)
Creatinine, Ser: 1.87 mg/dL — ABNORMAL HIGH (ref 0.61–1.24)
GFR calc non Af Amer: 36 mL/min — ABNORMAL LOW (ref >60)
GFR, EST AFRICAN AMERICAN: 41 mL/min — AB (ref >60)
Glucose, Bld: 138 mg/dL — ABNORMAL HIGH (ref 70–99)
POTASSIUM: 3.7 mmol/L (ref 3.5–5.1)
Sodium: 136 mmol/L (ref 135–145)

## 2017-10-05 LAB — COOXEMETRY PANEL
CARBOXYHEMOGLOBIN: 1.6 % — AB (ref 0.5–1.5)
Carboxyhemoglobin: 1.3 % (ref 0.5–1.5)
Methemoglobin: 1.8 % — ABNORMAL HIGH (ref 0.0–1.5)
Methemoglobin: 1 % (ref 0.0–1.5)
O2 Saturation: 60.8 %
O2 Saturation: 66.4 %
Total hemoglobin: 9.9 g/dL — ABNORMAL LOW (ref 12.0–16.0)
Total hemoglobin: 9.6 g/dL — ABNORMAL LOW (ref 12.0–16.0)

## 2017-10-05 LAB — GLUCOSE, CAPILLARY
GLUCOSE-CAPILLARY: 131 mg/dL — AB (ref 70–99)
GLUCOSE-CAPILLARY: 124 mg/dL — AB (ref 70–99)
Glucose-Capillary: 147 mg/dL — ABNORMAL HIGH (ref 70–99)
Glucose-Capillary: 130 mg/dL — ABNORMAL HIGH (ref 70–99)
Glucose-Capillary: 119 mg/dL — ABNORMAL HIGH (ref 70–99)

## 2017-10-05 LAB — HEPARIN LEVEL (UNFRACTIONATED)
Heparin Unfractionated: 0.57 IU/mL (ref 0.30–0.70)
Heparin Unfractionated: 0.12 IU/mL — ABNORMAL LOW (ref 0.30–0.70)

## 2017-10-05 MED ORDER — MILRINONE LACTATE IN DEXTROSE 20-5 MG/100ML-% IV SOLN
0.1250 ug/kg/min | INTRAVENOUS | Status: DC
  Administered 2017-10-05: 0.25 ug/kg/min via INTRAVENOUS
  Filled 2017-10-05: qty 100

## 2017-10-05 MED ORDER — METOLAZONE 2.5 MG PO TABS
2.5000 mg | ORAL_TABLET | Freq: Once | ORAL | Status: AC
  Administered 2017-10-05: 2.5 mg via ORAL
  Filled 2017-10-05: qty 1

## 2017-10-05 MED ORDER — HEPARIN (PORCINE) IN NACL 100-0.45 UNIT/ML-% IJ SOLN
1800.0000 [IU]/h | INTRAMUSCULAR | Status: DC
  Administered 2017-10-05: 1650 [IU]/h via INTRAVENOUS
  Administered 2017-10-06 – 2017-10-07 (×2): 1800 [IU]/h via INTRAVENOUS
  Filled 2017-10-05 (×3): qty 250

## 2017-10-05 MED ORDER — POTASSIUM CHLORIDE 20 MEQ/15ML (10%) PO SOLN
40.0000 meq | Freq: Two times a day (BID) | ORAL | Status: AC
  Administered 2017-10-05 (×2): 40 meq via ORAL
  Filled 2017-10-05 (×2): qty 30

## 2017-10-05 MED ORDER — AMIODARONE LOAD VIA INFUSION
150.0000 mg | Freq: Once | INTRAVENOUS | Status: AC
  Administered 2017-10-05: 150 mg via INTRAVENOUS
  Filled 2017-10-05: qty 83.34

## 2017-10-05 MED ORDER — SODIUM CHLORIDE 0.9 % IV SOLN
1.0000 g | INTRAVENOUS | Status: AC
  Administered 2017-10-05 – 2017-10-11 (×7): 1 g via INTRAVENOUS
  Filled 2017-10-05 (×8): qty 10

## 2017-10-05 NOTE — Progress Notes (Signed)
Audible cuff leak as RT walked in room.  Volumes not met on vent, patient alert and not in distress.  Tube advanced to 28cm @ the lip from previous 25cm . Cuff leak gone, patient now able to get volumes on vent.  RN aware.  RT to ask RN for CXR.

## 2017-10-05 NOTE — Progress Notes (Signed)
[facility] Progress Note Patient Name: Logan [name] DOB: [birth year] MRN: [id number]   Date of Service  10/05/2017  HPI/Events of Note  Nurse requesting CXR  s/p advancement of ETT by 3 cm  eICU Interventions  CXR ordered        [place]wo U Ogan 10/05/2017, 8:15 PM

## 2017-10-05 NOTE — Progress Notes (Signed)
Sputum culture growing H flu. Want to avoid FQ since patient also on Amiodarone. Therefore will start Ceftriaxone.

## 2017-10-05 NOTE — Progress Notes (Addendum)
Advanced Heart Failure Rounding Note  PCP-Cardiologist: Sinclair Grooms, MD   Subjective:   Remains on Norepi 5 mcg + lasix 10 mg per hour. Poor diuresis noted.   CO-OX 61%. .   Follows commands on vent.   Objective:   Weight Range: 235 lb 0.2 oz (106.6 kg) Body mass index is 32.78 kg/m.   Vital Signs:   Temp:  [98.4 F (36.9 C)-100.4 F (38 C)] 99.2 F (37.3 C) (07/25 0442) Pulse Rate:  [69-125] 110 (07/25 0630) Resp:  [21-29] 24 (07/25 0630) BP: (84-121)/(54-82) 96/71 (07/25 0630) SpO2:  [100 %] 100 % (07/25 0300) FiO2 (%):  [40 %-50 %] 40 % (07/25 0300) Weight:  [235 lb 0.2 oz (106.6 kg)] 235 lb 0.2 oz (106.6 kg) (07/25 0457) Last BM Date: 10/03/17  Weight change: Filed Weights   10/03/17 2300 10/04/17 0500 10/05/17 0457  Weight: 237 lb 10.5 oz (107.8 kg) 237 lb 10.5 oz (107.8 kg) 235 lb 0.2 oz (106.6 kg)    Intake/Output:   Intake/Output Summary (Last 24 hours) at 10/05/2017 0710 Last data filed at 10/05/2017 0600 Gross per 24 hour  Intake 2623.39 ml  Output 1245 ml  Net 1378.39 ml      Physical Exam    General:  Intubated Awake HEENT: ETT with bright red blood  Neck: Supple. JVP elevated. Carotids 2+ bilat; no bruits. No lymphadenopathy or thyromegaly appreciated. Cor: PMI nondisplaced. Irregular rate & rhythm. No rubs, gallops or murmurs. Lungs: Clear Abdomen: Soft, nontender, nondistended. No hepatosplenomegaly. No bruits or masses. Good bowel sounds. Extremities: No cyanosis, clubbing, rash, edema. EUE PICC  Neuro: Awake follows commands. MAE  Telemetry   Sinus Tach versus A flutter   EKG    Pending   Labs    CBC Recent Labs    10/03/17 2347 10/04/17 0512 10/05/17 0308  WBC 11.5* 10.0 10.6*  NEUTROABS 9.2*  --   --   HGB 11.5* 10.3* 10.3*  HCT 37.2* 32.0* 31.0*  MCV 94.7 92.2 89.9  PLT 295 227 825   Basic Metabolic Panel Recent Labs    10/03/17 2347 10/04/17 0512 10/05/17 0308  NA 140 141 136  K 4.9 3.9 3.7  CL 97*  101 97*  CO2 '30 29 27  '$ GLUCOSE 238* 124* 138*  BUN '16 15 16  '$ CREATININE 1.55* 1.56* 1.87*  CALCIUM 8.8* 8.5* 8.1*  MG 2.2 2.0  --   PHOS 6.8* 4.3  --    Liver Function Tests No results for input(s): AST, ALT, ALKPHOS, BILITOT, PROT, ALBUMIN in the last 72 hours. No results for input(s): LIPASE, AMYLASE in the last 72 hours. Cardiac Enzymes Recent Labs    10/03/17 2347 10/04/17 0512 10/04/17 1046  TROPONINI 0.04* 0.04* 0.04*    BNP: BNP (last 3 results) Recent Labs    09/26/17 0955 10/03/17 2347  BNP 537.4* 362.5*    ProBNP (last 3 results) No results for input(s): PROBNP in the last 8760 hours.   D-Dimer No results for input(s): DDIMER in the last 72 hours. Hemoglobin A1C No results for input(s): HGBA1C in the last 72 hours. Fasting Lipid Panel Recent Labs    10/03/17 2347  TRIG 104   Thyroid Function Tests No results for input(s): TSH, T4TOTAL, T3FREE, THYROIDAB in the last 72 hours.  Invalid input(s): FREET3  Other results:   Imaging    [place] Ekg Site Rite  Result Date: 10/04/2017 If Site Rite image not attached, placement could not be confirmed due  to current cardiac rhythm.     Medications:     Scheduled Medications: . chlorhexidine gluconate (MEDLINE KIT)  15 mL Mouth Rinse BID  . Chlorhexidine Gluconate Cloth  6 each Topical Daily  . famotidine  20 mg Per Tube BID  . insulin aspart  1-3 Units Subcutaneous Q4H  . mouth rinse  15 mL Mouth Rinse 10 times per day  . sodium chloride flush  10-40 mL Intracatheter Q12H  . sodium chloride flush  3 mL Intravenous Q12H     Infusions: . sodium chloride    . sodium chloride    . amiodarone 30 mg/hr (10/05/17 0600)  . fentaNYL infusion INTRAVENOUS 150 mcg/hr (10/05/17 0600)  . furosemide (LASIX) infusion 10 mg/hr (10/05/17 0600)  . heparin 1,850 Units/hr (10/05/17 0600)  . norepinephrine (LEVOPHED) Adult infusion 5 mcg/min (10/05/17 0705)  . phenylephrine (NEO-SYNEPHRINE) Adult infusion  Stopped (10/04/17 1046)     PRN Medications:  sodium chloride, sodium chloride, acetaminophen (TYLENOL) oral liquid 160 mg/5 mL **OR** acetaminophen, docusate, fentaNYL (SUBLIMAZE) injection, fentaNYL (SUBLIMAZE) injection, midazolam, ondansetron (ZOFRAN) IV, sodium chloride flush, sodium chloride flush    Patient Profile    Logan [name] is 67 year old with history of HTN, A Fib, OSA, untreated prostate cancer 2012 , RLL mass 2015 declined treatment, hemoptysis for the last 6 months (he stopped xarelto), and chronic systolic heart failure.   Admitted with A fib RVR and A/C systolic heart failure.     Assessment/Plan   1. A/C Biventricular Heart Failure NICM . Most recent [place] 04/2015 patent coronaries. - ECHO 08/28/17 EF 10-15%  -CO-OX stable 61% on norepi 5 mcg. Add milrinone 0.125 mcg  -Continue lasix  drip to 10 mg per hour and give 2.5 mg metolazone.  -No bb. - Set up CVP today   2. A fib RVR TEE 7/22 with smoke  Amio drip restarted and he has converted but looks like he may be in A flutter today. Increase amio 60 mg per hour.   Hold heparin gtt x 4 hours with recurrent hemoptysis.  Stop aspirin  3. Acute Hypoxic Respiratory Failure Intubated 10/03/17  4. Hemoptysis 6 months ago he stopped xarelto and hemoptysis resolved.  Blood noted in ETT.  Currently on heparin for A fib. Will need to stop heparin with bleeding in ETT.  hgb 11/5>10.3 >10.3   5. AKI Creatinine trending up. 1.0>1.5 >1.8  Follow daily BMET   6. Lung Mass--2015 decline treatment  Oncology Following.  Concern for metastatic ca. Stage IV lung cancer.  S/P Thoracentesis 7/17  7. Liver Mass S/P Liver Bx 10/02/2017  8. H/O Prostate Cancer 2012    Length of Stay: [place], NP  10/05/2017, 7:10 AM  Advanced Heart Failure Team Pager [phone] (M-F; [place])  Please contact [facility] for night-coverage after hours (4p -7a ) and weekends on [email or web address]  Patient seen with NP,  agree with the above note.    He is currently intubated but awakens and can follow commands. Currently on norepinephrine 5, amiodarone gtt, Lasix gtt 10 mg/hr, heparin gtt.  Copious hemoptysis this morning. Marland Kitchen    He is back in atrial fibrillation with HR 100s, SBP 90s-100s.    On exam, he awakens and follows commands.  JVP difficult but appears elevated.  1+ ankle edema.  Rhonchi bilaterally.  Mildly tachy, irregular S1S2.   1. Acute on chronic systolic CHF:  TEE 4/43 with EF 15%.  Long history of nonischemic cardiomyopathy.  No  ICD.  He was admitted with afib/RVR and dyspnea.  Failed TEE-guided DCCV and intubated likely due to pulmonary edema.  Exam difficult for volume but CXR with pulmonary edema.  MAP stable this morning on norepinephrine, unfortunately went back into afib overnight.  Co-ox ok at 61%.  - Add milrinone 0.125 (low dose) today and continue norepinephrine as needed to maintain BP.   - Continue Lasix gtt 10 mg/hr and add metolazone 2.5 x 1.     - Set up CVP.    - Long-term prognosis with combination of metastatic lung cancer and severe CHF (low output) is very poor.  He is not a candidate for advanced cardiac therapies.  2. Lung cancer: Stage IV with liver mets, adenocarcinoma.  Seen by oncology, not candidate for chemotherapy but may be candidate for immunotherapy depending on genomics.  3. Hemoptysis: Active today, has had in past as well.  From lung mass.  He has been on heparin peri-cardioversion. Hgb is stable.  - Hold heparin for 4 hours and reassess.  4. Atrial fibrillation: Initially with RVR, likely was driving CHF.  He failed DCCV but then with addition of amiodarone had gone back to NSR on 7/24, but today he is back in afib HR 100s.  - Amiodarone to 60 mg/hr for rate control.  - Holding heparin for now as above with profuse hemoptysis.   5. AKI: Creatinine up to 1.8.  Follow with diuresis attempts, will add milrinone to try to improve cardiac output.   6. H/o  elevated PSA: Declined workup in past.  7. Acute hypoxemic respiratory failure: Suspect due to pulmonary edema.  No evidence for active infection.  Diurese.   CRITICAL CARE Performed by: Loralie Champagne  Total critical care time: 40 minutes  Critical care time was exclusive of separately billable procedures and treating other patients.  Critical care was necessary to treat or prevent imminent or life-threatening deterioration.  Critical care was time spent personally by me on the following activities: development of treatment plan with patient and/or surrogate as well as nursing, discussions with consultants, evaluation of patient's response to treatment, examination of patient, obtaining history from patient or surrogate, ordering and performing treatments and interventions, ordering and review of laboratory studies, ordering and review of radiographic studies, pulse oximetry and re-evaluation of patient's condition.  Loralie Champagne 10/05/2017 7:30 AM

## 2017-10-05 NOTE — Progress Notes (Signed)
   Afternoon follow up. Earlier today milrinone started at 0.25 mcg. Milrinone was later turned down to 0.125 mcg due to A fib RVR.   Diuresing with IV lasix. CVP 10.   Remains in A fib RVR 120-140s.   Stop milrinone. Continue amio drip and norepi drip.   Amy Clegg NP-C  3:42 PM

## 2017-10-05 NOTE — Progress Notes (Signed)
Dr.Rosenblatt notified of positive respiratory culture. No new orders. Etta Quill.RN

## 2017-10-05 NOTE — Progress Notes (Signed)
Logan CONSULT NOTE - Follow-Up  Pharmacy Consult for Heparin Indication: atrial fibrillation  No Known Allergies  Patient Measurements: Height: 5\' 11"  (180.3 cm) Weight: 235 lb 0.2 oz (106.6 kg) IBW/kg (Calculated) : 75.3 Heparin Dosing Weight: 97kg  Vital Signs: Temp: 98.7 F (37.1 [name]) (07/25 2000) Temp Source: Axillary (07/25 2000) BP: 91/71 (07/25 2100) Pulse Rate: 67 (07/25 2100)  Labs: Recent Labs    10/03/17 2347 10/04/17 0512 10/04/17 0741 10/04/17 1046 10/05/17 0308 10/05/17 1949  HGB 11.5* 10.3*  --   --  10.3*  --   HCT 37.2* 32.0*  --   --  31.0*  --   PLT 295 227  --   --  216  --   HEPARINUNFRC  --   --  0.37  --  0.57 0.12*  CREATININE 1.55* 1.56*  --   --  1.87*  --   TROPONINI 0.04* 0.04*  --  0.04*  --   --     Estimated Creatinine Clearance: 47.6 mL/min (A) (by [name]-G formula based on SCr of 1.87 mg/dL (H)).   Medical History: Past Medical History:  Diagnosis Date  . CHF (congestive heart failure) ([place])   . Chronic systolic heart failure ([place]) 04/15/2015    EF 30-35% by echo 2016 , [facility]   . Hypertension   . Lung mass 04/15/2015    Never biopsied. History of PET scan that raised concern.   . Metabolic syndrome 08/14/6946  . OSA on CPAP   . Paroxysmal atrial fibrillation ([place]) 04/15/2015   On apixaban   . Prostate cancer ([place])   . Thrombocytopenia ([place]) 04/15/2015    Medications:  Infusions:  . sodium chloride    . sodium chloride    . amiodarone 60 mg/hr (10/05/17 2110)  . cefTRIAXone (ROCEPHIN)  IV    . fentaNYL infusion INTRAVENOUS 150 mcg/hr (10/05/17 2110)  . furosemide (LASIX) infusion 10 mg/hr (10/05/17 2110)  . heparin 1,650 Units/hr (10/05/17 2110)  . norepinephrine (LEVOPHED) Adult infusion 5 mcg/min (10/05/17 2110)  . phenylephrine (NEO-SYNEPHRINE) Adult infusion Stopped (10/04/17 1046)    Assessment: 16 yom presented to the ED with afib with RVR from cardiologist office. Pharmacy consulted to dose heparin. He has been  on xarelto in the past but stopped taking with ongoing hemoptysis. Pt failed DCCV 7/19 and went into rapid AFib RVR 7/23 pm requiring intubation.   Heparin level tonight after heparin being held and started at reduced dose came back subtherapeutic at 0.12, on 1650 units/hr. Bleeding had been reported earlier and continues but not worsening in ET tube. CBC stable this morning. Scr increased today to 1.87.   LFT's and INR were close to normal on 7/16, will recheck in am.   Goal of Therapy:  Heparin level 0.3-0.7 units/ml Monitor platelets by Logan protocol: Yes   Plan:  -Increase heparin level slightly to 1700 units/hr -Monitor daily heparin level and CBC, s/sx bleeding  Doylene Canard, PharmD Clinical Pharmacist  Pager: [phone] Phone: [phone] 10/05/2017 9:33 PM

## 2017-10-05 NOTE — Progress Notes (Signed)
ANTICOAGULATION CONSULT NOTE - Follow-Up  Pharmacy Consult for Heparin Indication: atrial fibrillation  No Known Allergies  Patient Measurements: Height: 5\' 11"  (180.3 cm) Weight: 235 lb 0.2 oz (106.6 kg) IBW/kg (Calculated) : 75.3 Heparin Dosing Weight: 97kg  Vital Signs: Temp: 99.2 F (37.3 C) (07/25 0442) Temp Source: Oral (07/25 0442) BP: 96/71 (07/25 0630) Pulse Rate: 110 (07/25 0630)  Labs: Recent Labs    10/03/17 1017 10/03/17 2347 10/04/17 0512 10/04/17 0741 10/04/17 1046 10/05/17 0308  HGB 11.0* 11.5* 10.3*  --   --  10.3*  HCT 33.8* 37.2* 32.0*  --   --  31.0*  PLT 234 295 227  --   --  216  HEPARINUNFRC 0.42  --   --  0.37  --  0.57  CREATININE 1.04 1.55* 1.56*  --   --  1.87*  TROPONINI  --  0.04* 0.04*  --  0.04*  --     Estimated Creatinine Clearance: 47.6 mL/min (A) (by C-G formula based on SCr of 1.87 mg/dL (H)).   Medical History: Past Medical History:  Diagnosis Date  . CHF (congestive heart failure) ([place])   . Chronic systolic heart failure ([place]) 04/15/2015    EF 30-35% by echo 2016 , [facility]   . Hypertension   . Lung mass 04/15/2015    Never biopsied. History of PET scan that raised concern.   . Metabolic syndrome 07/12/256  . OSA on CPAP   . Paroxysmal atrial fibrillation ([place]) 04/15/2015   On apixaban   . Prostate cancer ([place])   . Thrombocytopenia ([place]) 04/15/2015    Medications:  Infusions:  . sodium chloride    . sodium chloride    . amiodarone 30 mg/hr (10/05/17 0600)  . fentaNYL infusion INTRAVENOUS 150 mcg/hr (10/05/17 0600)  . furosemide (LASIX) infusion 10 mg/hr (10/05/17 0600)  . heparin    . milrinone    . norepinephrine (LEVOPHED) Adult infusion 5 mcg/min (10/05/17 0705)  . phenylephrine (NEO-SYNEPHRINE) Adult infusion Stopped (10/04/17 1046)    Assessment: 28 yom presented to the ED with afib with RVR from cardiologist office. Pharmacy consulted to dose heparin. He has been on xarelto in the past but stopped taking with  ongoing hemoptysis. Pt failed DCCV 7/19 and went into rapid AFib RVR 7/23 pm requiring intubation.   Bright red blood per ETT this am (was also somewhat present yesterday but worse this am). Heparin at middle of range, will hold heparin infusion this morning and plan to restart at noon if bleeding improved. CBC has remained stable this admit.   LFT's and INR were close to normal on 7/16, will recheck in am.   Goal of Therapy:  Heparin level 0.3-0.7 units/ml Monitor platelets by anticoagulation protocol: Yes   Plan:  -Hold heparin this am, restart at lower rate and aim for low end of goal. -Heparin level tonight -Monitor daily heparin level and CBC, s/sx bleeding  Erin Hearing PharmD., BCPS Clinical Pharmacist 10/05/2017 7:24 AM

## 2017-10-05 NOTE — Progress Notes (Signed)
Pt placed back on full support due to increased WOB, RR >40, Spo2 87%.  Pt quickly recovered once on full support.  RT will continue to monitor

## 2017-10-05 NOTE — Progress Notes (Signed)
PULMONARY / CRITICAL CARE MEDICINE   Name: Logan [name] MRN: [id number] DOB: 11-17-1950    ADMISSION DATE:  09/26/2017  HISTORY OF PRESENT ILLNESS:   80yoM with hx CHF (EF 25-30%), HTN, Afib (on ASA as outpatient, had hemoptysis when on Xarelto), OSA on CPAP, Prostate cancer, and hypermetabolic RLL mass with left adrenal nodule (diagnosed on PET scan 03/2015), admitted with CHF exacerbation and Afib with RVR. Since then he has undergone thoracentesis 7/17 and liver biopsy 7/22. Patient has been hypotensive which has limited ability to diurese him. Today his amiodarone infusion was stopped as there was concern it was causing hypotension. Then this evening patient went into Afib with RVR and Flash pulmonary edema. He then became unresponsive due to acute hypercapnea. Cardiology and Anesthesiology at bedside bagging patient. [facility]'s code pager was activated (although patient never lost a pulse) and [facility] asked PCCM ground team to report emergently to bedside. Cardiology gave history as listed above. Following intubation, patient transferred to ICU under PCCM service.    7/24 The patient developed rapid Afib with RVR and went intoi flash pulmonary edema. He had to be intubated and moved to the ICU. He is intubated presently. He is hemodynamically stable at present but is on a small dose of neosynephrine. He is on [place] Amidoarone drip.  7/25 The patient is on a wean presently. He looks quite comfortable. He is on 40% FI02.He is presently on a milrinone, amiodarone and levophed infusions. He remains on a lasix infusion as well.     PAST MEDICAL HISTORY :  He  has a past medical history of CHF (congestive heart failure) ([place]), Chronic systolic heart failure ([place]) (04/15/2015), Hypertension, Lung mass (26/94/8546), Metabolic syndrome (04/20/348), OSA on CPAP, Paroxysmal atrial fibrillation ([place]) (04/15/2015), Prostate cancer ([place]), and Thrombocytopenia ([place]) (04/15/2015).  PAST SURGICAL HISTORY: He   has a past surgical history that includes Prostate biopsy; Cardiac catheterization (N/A, 04/27/2015); IR THORACENTESIS ASP PLEURAL SPACE W/IMG GUIDE (09/27/2017); TEE without cardioversion (N/A, 09/29/2017); and Cardioversion (N/A, 09/29/2017).  No Known Allergies  No current facility-administered medications on file prior to encounter.    Current Outpatient Medications on File Prior to Encounter  Medication Sig  . aspirin 81 MG tablet Take 81 mg by mouth daily.  . carvedilol (COREG) 12.5 MG tablet Take 1 tablet (12.5 mg total) by mouth 2 (two) times daily.  . Cholecalciferol ([employer] VIT D 5000 HIGH-POTENCY) 5000 units capsule Take 5,000 Units by mouth daily.  . furosemide (LASIX) 40 MG tablet Take 1 tablet (40 mg total) by mouth daily.  . Multiple Vitamin (MULTIVITAMIN) tablet Take 1 tablet by mouth daily.  . sacubitril-valsartan (ENTRESTO) 24-26 MG Take 1 tablet by mouth 2 (two) times daily.    FAMILY HISTORY:  His family history includes Hypertension in his father; Parkinson's disease in his mother.  SOCIAL HISTORY: He  reports that he quit smoking about 43 years ago. His smoking use included cigarettes, cigars, and pipe. He quit after 6.50 years of use. He has never used smokeless tobacco. He reports that he drinks alcohol. He reports that he does not use drugs.  REVIEW OF SYSTEMS:   Review of Systems  Unable to perform ROS: Critical illness   SUBJECTIVE:  Intubated, Sedated   VITAL SIGNS: BP 111/68   Pulse (!) 118   Temp 99.2 F (37.3 C) (Oral)   Resp (!) 22   Ht '5\' 11"'$  (1.803 m)   Wt 235 lb 0.2 oz (106.6 kg)   SpO2 98%  BMI 32.78 kg/m   HEMODYNAMICS: CVP:  [20 mmHg] 20 mmHgNormotensive   VENTILATOR SETTINGS: Vent Mode: PSV;CPAP FiO2 (%):  [40 %] 40 % Set Rate:  [24 bmp] 24 bmp Vt Set:  [600 mL] 600 mL PEEP:  [5 cmH20-8 cmH20] 5 cmH20 Pressure Support:  [12 cmH20] 12 cmH20 Plateau Pressure:  [27 cmH20-37 cmH20] 32 cmH20  INTAKE / OUTPUT: I/O last 3 completed  shifts: In: 3282.7 [I.V.:2457.7; Other:825] Out: 4270 [WCBJS:2831]  PHYSICAL EXAMINATION: General: WDWN Adult male, Intubated but awke Neuro: the patient is responsive and following commmands HEENT: Orally intubated, OP clear, MM moist  Cardiovascular: Irreg irreg with HR in   Lungs: Coarse rhonchi bilaterally; Bloody appearing pulmonary edema in ETT Abdomen: Obese soft NTND Musculoskeletal: 1+ BLE edema  Skin: no rashes   LABS:  BMET Recent Labs  Lab 10/03/17 2347 10/04/17 0512 10/05/17 0308  NA 140 141 136  K 4.9 3.9 3.7  CL 97* 101 97*  CO2 '30 29 27  '$ BUN '16 15 16  '$ CREATININE 1.55* 1.56* 1.87*  GLUCOSE 238* 124* 138*   Electrolytes Recent Labs  Lab 10/03/17 2347 10/04/17 0512 10/05/17 0308  CALCIUM 8.8* 8.5* 8.1*  MG 2.2 2.0  --   PHOS 6.8* 4.3  --    CBC Recent Labs  Lab 10/03/17 2347 10/04/17 0512 10/05/17 0308  WBC 11.5* 10.0 10.6*  HGB 11.5* 10.3* 10.3*  HCT 37.2* 32.0* 31.0*  PLT 295 227 216   Coag's Recent Labs  Lab 09/29/17 0434  INR 1.28   Sepsis Markers Recent Labs  Lab 10/03/17 2347 10/04/17 0211  LATICACIDVEN 3.9* 3.1*  PROCALCITON <0.10  --     ABG Recent Labs  Lab 10/03/17 2312 10/04/17 0032 10/04/17 0435  PHART 7.103* 7.364 7.422  PCO2ART 103* 52.1* 46.7  PO2ART 117* 117* 111*    Liver Enzymes No results for input(s): AST, ALT, ALKPHOS, BILITOT, ALBUMIN in the last 168 hours.  Cardiac Enzymes Recent Labs  Lab 10/03/17 2347 10/04/17 0512 10/04/17 1046  TROPONINI 0.04* 0.04* 0.04*    Glucose Recent Labs  Lab 10/04/17 1226 10/04/17 1702 10/04/17 2008 10/04/17 2309 10/05/17 0415 10/05/17 0824  GLUCAP 125* 116* 142* 108* 147* 130*    Imaging [place] Ekg Site Rite  Result Date: 10/04/2017 If Site Rite image not attached, placement could not be confirmed due to current cardiac rhythm.   STUDIES:  CTA PE 03/2014 >>  neg for PE; A large right infrahilar soft tissue mass is identified measuring 7.7 x 6.1 cm  in transaxial dimensions by 5.8 cm in craniocaudal height. This demonstrates heterogeneous enhancement. Mild diffuse groundglass densities are identified throughout the lungs;  mildly enlarged precarinal lymph node measuring 1.4 cm in short axis.  PET 04/07/2015  >> 1.  Hypermetabolic right lower lobe mass max SUV 13.2 measuring 6.2 x 7.9 cm (image128). The mass previously measured 6.1 x 7.7 cm 2.  12 mm hypermetabolic left adrenal nodule (image 155). 3.  No other worrisome hypermetabolic activity is identified. 4.  No hypermetabolic 4 mm subpleural right upper lobe nodule (image 24) 2 small            accurately characterize by PET.  09/26/2017 CT chest without contrast >>  1. Large RIGHT lower lobe mass with central and peripheral calcifications occupies the near entirety of the RIGHT lower lobe and concerning for neoplasm. 2. Moderate RIGHT effusion. 3. Mild mediastinal and RIGHT hilar adenopathy. 4. Recommend FDG PET scan for biopsy planning/staging  7/17 CT abd  and pelvis >>  1. Large complex masses in the right lower lobe of the lung and in the right hepatic lobe. Smaller liver lesions in the left hepatic lobe. Nonspecific lesions in the spleen and kidneys. No significant pathologic adenopathy. This would not be a characteristic metastatic pattern for prostate cancer and is not a specific metastatic and imaging pattern to suggest an individual etiology. Possibilities might include metastatic lung cancer, melanoma, metastatic hepatocellular carcinoma, or a variety of other possibilities. Tissue diagnosis recommended. 2. Moderate prostatomegaly. 3. Scattered mild ascites. 4. Moderate cardiomegaly. Small inferior pericardial effusion. 5. Trace bilateral pleural effusions  CULTURES: Sputum culture: ordered   ANTIBIOTICS: None   SIGNIFICANT EVENTS: 7/16 Admit to cardiology  7/23: Afib with RVR >> Flash pulm edema >> Respiratory failure requiring  intubation  LINES/TUBES: PIV's Foley catheter 7/23>> ETT 7/23>> OG tube 7/23>>  DISCUSSION: 66yoM with hx CHF (EF 25-30%), HTN, Afib (on ASA as outpatient, had hemoptysis when on Xarelto), OSA on CPAP, Prostate cancer, and hypermetabolic RLL mass with left adrenal nodule (diagnosed on PET scan 03/2015), admitted with CHF exacerbation and Afib with RVR. Developed Afib with RVR and Flash pulmonary edema on 7/23 PM requiring intubation.   ASSESSMENT / PLAN:  PULMONARY 1. Acute Hypoxic and Hypercapneic Respiratory failure; Flash pulmonary edema - reviewed ABG showing severe acute-on-chronic hypercapnea (7.10/103/117/30); increased RR from 20 to 24 on vent. Repeat ABG in 1 hour - received '40mg'$  Lasix IV during intubation; will start lasix infusion - CXR on my review shows diffuse bilateral pulmonary edema 2. RLL Mass: hypermetabolic on PET scan, likely malignant in nature. Biopsy of liver met pending.  3. Right pleural effusion: s/p thoracentesis 7/17 with cytology showing reactive mesothelial cells 7/24 The patient is presently intubated and sedated. He is on full vent support. He is on 40% FI02 and PEEP 5 No plan on weaning patient today. Have not seen result onpleural fluid cytology yet.  7/25 The patient is doing relatively well on a wean.   He has decent TVs on PS of 10. TVs are well into the 400 range. Minute ventialtion <10 lpm. I am hopeful thepatient can be weaned today.   CARDIOVASCULAR 1. Afib with RVR; Acute systolic CHF exacerbation: - unsuccessful cardioversion on 7/19, returning to Afib following procedure.   - continue amiodarone infusion - Echo now shows EF 10-15% from baseline of 25%, likely worsening EF due to the Afib RVR.  He diuresed about 1245 yesterday but was stilll 1378 ahead. He remains on the lasix drip at 10 mg /hr. Difficult to press for more aggressive diuresis in the face of his hypotension. His central venous 02 sat was 60% so the muilrinoine was  added.    RENAL 1. AKI: - creatinine 1.3 (on ISTAT) up from baseline of 1.04, likely cardiorenal from volume overload - place foley catheter; continue diuresis as BP and renal function tolerate. Monitor UOP closely. Avoid nephrotoxic agents. Creatinine is 1.55 today. Urine output was 875 in the past 24 hours.  GASTROINTESTINAL 1. Anemia: - Hgb 11.0 which is at his baseline of 10-11; continue monitoring.   HEMATOLOGIC/Oncologic 1. 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 Initial concern was for a malignant pleural effusion as well, although cytology was negative. Liver biopsy on 7/22 is still pending. Although he has a history of prostate cancer, it would be unusual for it to metastasize to the lung in this  fashion.  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 His wife is awre of these results.  Once he is more stable willneed to discuss possibke therapy with oncology.  INFECTIOUS No active issues   ENDOCRINE 1. Hyperglycemia: no hx of DM - start SSI q4hrs  NEUROLOGIC Patient is awake and following commands  FAMILY  - Updated patient's wife at bedside  - Inter-disciplinary family meet or Palliative Care meeting due by: 10/09/17    Criticalcare 45 minutes In the long run the patient is not likely to do well given his severely depressed LV fn.  Micheal Likens MD Pulmonary and [place] Pager: [phone]  10/05/2017, 8:42 AM

## 2017-10-06 DIAGNOSIS — R739 Hyperglycemia, unspecified: Secondary | ICD-10-CM

## 2017-10-06 LAB — HEPARIN LEVEL (UNFRACTIONATED)
Heparin Unfractionated: 0.19 IU/mL — ABNORMAL LOW (ref 0.30–0.70)
Heparin Unfractionated: 0.31 IU/mL (ref 0.30–0.70)

## 2017-10-06 LAB — BASIC METABOLIC PANEL
ANION GAP: 11 (ref 5–15)
BUN: 18 mg/dL (ref 8–23)
CO2: 29 mmol/L (ref 22–32)
Calcium: 8.6 mg/dL — ABNORMAL LOW (ref 8.9–10.3)
Chloride: 95 mmol/L — ABNORMAL LOW (ref 98–111)
Creatinine, Ser: 1.82 mg/dL — ABNORMAL HIGH (ref 0.61–1.24)
GFR calc Af Amer: 43 mL/min — ABNORMAL LOW (ref >60)
GFR calc non Af Amer: 37 mL/min — ABNORMAL LOW (ref >60)
GLUCOSE: 120 mg/dL — AB (ref 70–99)
POTASSIUM: 3.6 mmol/L (ref 3.5–5.1)
Sodium: 135 mmol/L (ref 135–145)

## 2017-10-06 LAB — GLUCOSE, CAPILLARY
GLUCOSE-CAPILLARY: 133 mg/dL — AB (ref 70–99)
Glucose-Capillary: 110 mg/dL — ABNORMAL HIGH (ref 70–99)
Glucose-Capillary: 111 mg/dL — ABNORMAL HIGH (ref 70–99)
Glucose-Capillary: 112 mg/dL — ABNORMAL HIGH (ref 70–99)
Glucose-Capillary: 114 mg/dL — ABNORMAL HIGH (ref 70–99)
Glucose-Capillary: 128 mg/dL — ABNORMAL HIGH (ref 70–99)
Glucose-Capillary: 169 mg/dL — ABNORMAL HIGH (ref 70–99)

## 2017-10-06 LAB — CBC
HEMATOCRIT: 28.9 % — AB (ref 39.0–52.0)
HEMOGLOBIN: 9.7 g/dL — AB (ref 13.0–17.0)
MCH: 29.9 pg (ref 26.0–34.0)
MCHC: 33.6 g/dL (ref 30.0–36.0)
MCV: 89.2 fL (ref 78.0–100.0)
Platelets: 218 10*3/uL (ref 150–400)
RBC: 3.24 MIL/uL — AB (ref 4.22–5.81)
RDW: 14.1 % (ref 11.5–15.5)
WBC: 10 10*3/uL (ref 4.0–10.5)

## 2017-10-06 LAB — COMPREHENSIVE METABOLIC PANEL
ALBUMIN: 2.2 g/dL — AB (ref 3.5–5.0)
ALK PHOS: 61 U/L (ref 38–126)
ALT: 24 U/L (ref 0–44)
AST: 27 U/L (ref 15–41)
Anion gap: 15 (ref 5–15)
BILIRUBIN TOTAL: 1 mg/dL (ref 0.3–1.2)
BUN: 17 mg/dL (ref 8–23)
CALCIUM: 8.1 mg/dL — AB (ref 8.9–10.3)
CO2: 25 mmol/L (ref 22–32)
Chloride: 88 mmol/L — ABNORMAL LOW (ref 98–111)
Creatinine, Ser: 1.89 mg/dL — ABNORMAL HIGH (ref 0.61–1.24)
GFR calc Af Amer: 41 mL/min — ABNORMAL LOW (ref >60)
GFR calc non Af Amer: 35 mL/min — ABNORMAL LOW (ref >60)
GLUCOSE: 382 mg/dL — AB (ref 70–99)
POTASSIUM: 2.7 mmol/L — AB (ref 3.5–5.1)
Sodium: 128 mmol/L — ABNORMAL LOW (ref 135–145)
Total Protein: 6.9 g/dL (ref 6.5–8.1)

## 2017-10-06 LAB — CULTURE, RESPIRATORY W GRAM STAIN

## 2017-10-06 LAB — COOXEMETRY PANEL
Carboxyhemoglobin: 1.1 % (ref 0.5–1.5)
METHEMOGLOBIN: 1.8 % — AB (ref 0.0–1.5)
O2 SAT: 62.2 %
TOTAL HEMOGLOBIN: 9.7 g/dL — AB (ref 12.0–16.0)

## 2017-10-06 LAB — CULTURE, RESPIRATORY

## 2017-10-06 LAB — PROTIME-INR
INR: 1.53
Prothrombin Time: 18.3 seconds — ABNORMAL HIGH (ref 11.4–15.2)

## 2017-10-06 LAB — MAGNESIUM: Magnesium: 1.8 mg/dL (ref 1.7–2.4)

## 2017-10-06 MED ORDER — FUROSEMIDE 10 MG/ML IJ SOLN
60.0000 mg | Freq: Two times a day (BID) | INTRAMUSCULAR | Status: DC
  Administered 2017-10-06 – 2017-10-08 (×4): 60 mg via INTRAVENOUS
  Filled 2017-10-06 (×4): qty 6

## 2017-10-06 MED ORDER — INSULIN ASPART 100 UNIT/ML ~~LOC~~ SOLN
0.0000 [IU] | SUBCUTANEOUS | Status: DC
  Administered 2017-10-06: 3 [IU] via SUBCUTANEOUS
  Administered 2017-10-07 (×2): 2 [IU] via SUBCUTANEOUS

## 2017-10-06 MED ORDER — ORAL CARE MOUTH RINSE
15.0000 mL | Freq: Two times a day (BID) | OROMUCOSAL | Status: DC
  Administered 2017-10-06: 15 mL via OROMUCOSAL

## 2017-10-06 MED ORDER — MAGNESIUM SULFATE 2 GM/50ML IV SOLN
2.0000 g | Freq: Once | INTRAVENOUS | Status: AC
  Administered 2017-10-06: 2 g via INTRAVENOUS
  Filled 2017-10-06: qty 50

## 2017-10-06 MED ORDER — POTASSIUM CHLORIDE 20 MEQ/15ML (10%) PO SOLN
40.0000 meq | Freq: Once | ORAL | Status: AC
  Administered 2017-10-06: 40 meq via ORAL
  Filled 2017-10-06: qty 30

## 2017-10-06 MED ORDER — POTASSIUM CHLORIDE 10 MEQ/50ML IV SOLN
10.0000 meq | INTRAVENOUS | Status: AC
Start: 2017-10-06 — End: 2017-10-06
  Administered 2017-10-06 (×4): 10 meq via INTRAVENOUS
  Filled 2017-10-06 (×4): qty 50

## 2017-10-06 MED ORDER — INSULIN GLARGINE 100 UNIT/ML ~~LOC~~ SOLN
10.0000 [IU] | Freq: Every day | SUBCUTANEOUS | Status: DC
  Administered 2017-10-06 – 2017-10-10 (×5): 10 [IU] via SUBCUTANEOUS
  Filled 2017-10-06 (×5): qty 0.1

## 2017-10-06 MED FILL — Medication: Qty: 1 | Status: AC

## 2017-10-06 NOTE — Procedures (Signed)
Extubation Procedure Note  Patient Details:   Name: Logan [name] DOB: [birth year] MRN: [id number]   Airway Documentation:    Vent end date: 10/06/17 Vent end time: 0918   Evaluation  O2 sats: stable throughout Complications: No apparent complications Patient did tolerate procedure well. Bilateral Breath Sounds: Clear, Diminished   Pt extubated per MD order.  Pt had +cuff leak. Pt placed on 4L [place] tolerating well, no distress noted at this time   Ciro Backer 10/06/2017, 9:23 AM

## 2017-10-06 NOTE — Progress Notes (Signed)
Pt placed on PS trial by MD.  On arrival to room pt on PS 14/5 tolerating well.  RT will monitor

## 2017-10-06 NOTE — Progress Notes (Signed)
Patient had 215cc of fentanyl in bag after extubation. Remainder of bag wasted in sink, witnessed by [employer]. IV tubing set in dirty utility bin.

## 2017-10-06 NOTE — Progress Notes (Signed)
PS decreased to 8 by MD -  Unknown time.  MD would like to extubate at this time

## 2017-10-06 NOTE — Progress Notes (Signed)
ANTICOAGULATION CONSULT NOTE - Follow Up Consult  Pharmacy Consult for heparin Indication: atrial fibrillation  Labs: Recent Labs    10/03/17 2347 10/04/17 0512  10/04/17 1046 10/05/17 0308 10/05/17 1949 10/06/17 0326  HGB 11.5* 10.3*  --   --  10.3*  --  9.7*  HCT 37.2* 32.0*  --   --  31.0*  --  28.9*  PLT 295 227  --   --  216  --  218  LABPROT  --   --   --   --   --   --  18.3*  INR  --   --   --   --   --   --  1.53  HEPARINUNFRC  --   --    < >  --  0.57 0.12* 0.19*  CREATININE 1.55* 1.56*  --   --  1.87*  --   --   TROPONINI 0.04* 0.04*  --  0.04*  --   --   --    < > = values in this interval not displayed.    Assessment: 67yo male remains subtherapeutic on heparin after rate increase; RN reports that urine continues to have blood, no worse or better than earlier, Hgb continues to trend down, Plt stable.  Goal of Therapy:  Heparin level ~0.3 units/ml   Plan:  Will increase heparin gtt by 1 unit/kg/hr to 1800 units/hr and check level in 8 hours.    Wynona Neat, PharmD, BCPS  10/06/2017,4:33 AM

## 2017-10-06 NOTE — Progress Notes (Signed)
Critical potasium 2.7. MD notified please see mar for potasium replacement

## 2017-10-06 NOTE — Progress Notes (Signed)
[facility] Progress Note Patient Name: LARREN COPES DOB: 03-05-1951 MRN: [id number]   Date of Service  10/06/2017  HPI/Events of Note  Hypokalemia with PVC's  eICU Interventions  KCL 10 meq iv q 1 hr x 4 doses         Okoron[place]wo U Ogan 10/06/2017, 6:12 AM

## 2017-10-06 NOTE — Progress Notes (Addendum)
Advanced Heart Failure Rounding Note  PCP-Cardiologist: Sinclair Grooms, MD   Subjective:   Yesterday milrinone was added but later stopped due to A fib RVR.   Remains on Norepi 5 mcg + lasix 10 mg per hour. Diuresis improved. CVP down to 5.   Follows commands. Denies pain.    Objective:   Weight Range: 228 lb 13.4 oz (103.8 kg) Body mass index is 31.92 kg/m.   Vital Signs:   Temp:  [97.3 F (36.3 C)-99.9 F (37.7 C)] 98.9 F (37.2 C) (07/26 0400) Pulse Rate:  [60-167] 98 (07/26 0700) Resp:  [12-35] 25 (07/26 0700) BP: (91-128)/(58-92) 105/72 (07/26 0700) SpO2:  [93 %-100 %] 100 % (07/26 0400) FiO2 (%):  [40 %] 40 % (07/26 0302) Weight:  [228 lb 13.4 oz (103.8 kg)] 228 lb 13.4 oz (103.8 kg) (07/26 0500) Last BM Date: 10/03/17  Weight change: Filed Weights   10/04/17 0500 10/05/17 0457 10/06/17 0500  Weight: 237 lb 10.5 oz (107.8 kg) 235 lb 0.2 oz (106.6 kg) 228 lb 13.4 oz (103.8 kg)    Intake/Output:   Intake/Output Summary (Last 24 hours) at 10/06/2017 3546 Last data filed at 10/06/2017 5681 Gross per 24 hour  Intake 3020.89 ml  Output 4975 ml  Net -1954.11 ml      Physical Exam  CVP 5 personally checked.   General:  Intubated  HEENT: ETT Neck: supple. no JVD. Carotids 2+ bilat; no bruits. No lymphadenopathy or thryomegaly appreciated. Cor: PMI nondisplaced. Irregular rate & rhythm. No rubs, gallops or murmurs. Lungs: coarse throughout.  Abdomen: soft, nontender, nondistended. No hepatosplenomegaly. No bruits or masses. Good bowel sounds. Extremities: no cyanosis, clubbing, rash, edema. RUE PICC. R and LLE SCDs.  Neuro:Awake follows commands. MAE    Telemetry  A Fib with PVCs 90s    EKG    Pending   Labs    CBC Recent Labs    10/03/17 2347  10/05/17 0308 10/06/17 0326  WBC 11.5*   < > 10.6* 10.0  NEUTROABS 9.2*  --   --   --   HGB 11.5*   < > 10.3* 9.7*  HCT 37.2*   < > 31.0* 28.9*  MCV 94.7   < > 89.9 89.2  PLT 295   < > 216 218    < > = values in this interval not displayed.   Basic Metabolic Panel Recent Labs    10/03/17 2347 10/04/17 0512 10/05/17 0308 10/06/17 0326  NA 140 141 136 128*  K 4.9 3.9 3.7 2.7*  CL 97* 101 97* 88*  CO2 '30 29 27 25  '$ GLUCOSE 238* 124* 138* 382*  BUN '16 15 16 17  '$ CREATININE 1.55* 1.56* 1.87* 1.89*  CALCIUM 8.8* 8.5* 8.1* 8.1*  MG 2.2 2.0  --   --   PHOS 6.8* 4.3  --   --    Liver Function Tests Recent Labs    10/06/17 0326  AST 27  ALT 24  ALKPHOS 61  BILITOT 1.0  PROT 6.9  ALBUMIN 2.2*   No results for input(s): LIPASE, AMYLASE in the last 72 hours. Cardiac Enzymes Recent Labs    10/03/17 2347 10/04/17 0512 10/04/17 1046  TROPONINI 0.04* 0.04* 0.04*    BNP: BNP (last 3 results) Recent Labs    09/26/17 0955 10/03/17 2347  BNP 537.4* 362.5*    ProBNP (last 3 results) No results for input(s): PROBNP in the last 8760 hours.   D-Dimer No results for input(s):  DDIMER in the last 72 hours. Hemoglobin A1C No results for input(s): HGBA1C in the last 72 hours. Fasting Lipid Panel Recent Labs    10/03/17 2347  TRIG 104   Thyroid Function Tests No results for input(s): TSH, T4TOTAL, T3FREE, THYROIDAB in the last 72 hours.  Invalid input(s): FREET3  Other results:   Imaging    Dg Chest Port 1 View  Result Date: 10/05/2017 CLINICAL DATA:  Acute respiratory failure EXAM: PORTABLE CHEST 1 VIEW COMPARISON:  10/04/2016, 10/03/2017, 09/27/2017, CT chest 09/26/2017 FINDINGS: Endotracheal tube tip is about 2.6 cm superior to the carina. Right upper extremity catheter tip overlies the SVC. Esophageal tube tip is below the diaphragm but not well seen. Cardiomegaly with vascular congestion and mild edema. Small moderate right pleural effusion and small left pleural effusion. Similar appearance of dense airspace disease at the right lung base. IMPRESSION: 1. Endotracheal tube tip about 2.6 cm superior to carina. 2. Continued cardiomegaly with vascular  congestion and pulmonary edema. 3. Small moderate right pleural effusion and small left pleural effusion. Continued dense airspace disease at the right middle lobe and right base. Electronically Signed   By: Donavan Foil M.D.   On: 10/05/2017 20:37     Medications:     Scheduled Medications: . chlorhexidine gluconate (MEDLINE KIT)  15 mL Mouth Rinse BID  . Chlorhexidine Gluconate Cloth  6 each Topical Daily  . famotidine  20 mg Per Tube BID  . insulin aspart  1-3 Units Subcutaneous Q4H  . mouth rinse  15 mL Mouth Rinse 10 times per day  . sodium chloride flush  10-40 mL Intracatheter Q12H  . sodium chloride flush  3 mL Intravenous Q12H    Infusions: . sodium chloride    . sodium chloride    . amiodarone 60 mg/hr (10/06/17 0600)  . cefTRIAXone (ROCEPHIN)  IV Stopped (10/06/17 0024)  . fentaNYL infusion INTRAVENOUS 150 mcg/hr (10/06/17 0600)  . furosemide (LASIX) infusion 10 mg/hr (10/06/17 0600)  . heparin 1,800 Units/hr (10/06/17 0600)  . norepinephrine (LEVOPHED) Adult infusion 5 mcg/min (10/06/17 0600)  . phenylephrine (NEO-SYNEPHRINE) Adult infusion Stopped (10/04/17 1046)  . potassium chloride 10 mEq (10/06/17 0637)    PRN Medications: sodium chloride, sodium chloride, acetaminophen (TYLENOL) oral liquid 160 mg/5 mL **OR** acetaminophen, docusate, fentaNYL (SUBLIMAZE) injection, midazolam, ondansetron (ZOFRAN) IV, sodium chloride flush, sodium chloride flush    Patient Profile    [name] is 67 year old with history of HTN, A Fib, OSA, untreated prostate cancer 2012 , RLL mass 2015 declined treatment, hemoptysis for the last 6 months (he stopped xarelto), and chronic systolic heart failure.   Admitted with A fib RVR and A/C systolic heart failure.     Assessment/Plan   1. A/C Biventricular Heart Failure NICM . Most recent [place] 04/2015 patent coronaries. - ECHO 08/28/17 EF 10-15%  -CO-OX pending. Continue Norepi 5 mcg. May be able to wean later today.  - CVP  down to 5. Stop lasix drip.   - Supp K  -No bb.   2. A fib RVR TEE 7/22 with smoke  Amio drip restarted and he has converted but looks like he may be in A flutter today.  Cut back amio to 30 mg per hour.    Heparin stopped 7/25 for 4 hours with hemoptysis. Continue heparin.  Stop aspirin  3. Acute Hypoxic Respiratory Failure Intubated 10/03/17  4. Hemoptysis 6 months ago he stopped xarelto and hemoptysis resolved.  Blood noted in ETT but appears old.  hgb 11/5>10.3 >10.3 >9.7   5. AKI Creatinine trending up. 1.0>1.5 >1.8 >1.8 Follow daily BMET   6. Lung Mass--2015 decline treatment  Oncology Following.  Concern for metastatic ca. Stage IV lung cancer.  S/P Thoracentesis 7/17  7. Liver Mass S/P Liver Bx 10/02/2017  8. H/O Prostate Cancer 2012   9. Hyponatremia Sodium down to 128   10. Hypokalemia Supp K Repeat BMEt at 1300.    Length of Stay: [place], NP  10/06/2017, 7:12 AM  Advanced Heart Failure Team Pager [phone] (M-F; [place])  Please contact [facility] for night-coverage after hours (4p -7a ) and weekends on [email or web address]  Patient seen with NP, agree with the above note.   He is currently intubated but awake and can follow commands. Currently on norepinephrine 5, amiodarone gtt 60, Lasix gtt 10 mg/hr, heparin gtt. Heparin was held for a while yesterday with hemoptysis but he is back on it. CVP 5 today, good UOP yesterday and net negative 1.4 L.   He is back in atrial fibrillation with HR 90s-100s, SBP 90s-100s.    On exam, he awakens and follows commands. JVP not elevated. No edema. Decreased BS at bases. Irregular S1S2.   1. Acute on chronic systolic CHF: TEE 3/82 with EF 15%. Long history of nonischemic cardiomyopathy. No ICD. He was admitted with afib/RVR and dyspnea. Failed TEE-guided DCCV and intubated likely due to pulmonary edema. Today he remains on norepinephrine 5 (milrinone off due to worsening tachycardia).  CVP 5  today after very good diuresis yesterday.  Creatinine stable at 1.89.  - Awaiting co-ox.  - Wean norepinephrine today.  - Stop Lasix gtt, would start Lasix 60 mg IV bid this evening to keep I/Os at least even, continue to follow CVP.    - Long-term prognosis with combination of metastatic lung cancer and severe CHF (low output) is very poor. He is not a candidate for advanced cardiac therapies.  2. Lung cancer: Stage IV with liver mets, adenocarcinoma.  Seen by oncology, not candidate for chemotherapy but may be candidate for immunotherapy depending on genomics.  3. Hemoptysis: Better today, back on heparin gtt running at low end of therapeutic.  4. Atrial fibrillation: Initially with RVR, likely was driving CHF. He failed DCCV but then with addition of amiodarone had gone back to NSR on 7/24, but on 7/25 he was back in afib.  Today, HR 90s-100s.   - Amiodarone to 60 mg/hr for rate control => can decrease to 30 mg/hr today.  - Continue heparin gtt as long as hemoptysis is not marked. - possible repeat TEE-DCCV after norepinephrine off.     5. AKI: Creatinine 1.89, stable today.   6. H/o elevated PSA: Declined workup in past.  7. Acute hypoxemic respiratory failure: Suspect due to pulmonary edema + large RLL mass/hemoptysis. No evidence for active infection. He has diuresed well. - Wean vent today.    Discussed the above with patient and wife.   CRITICAL CARE Performed by: Loralie Champagne  Total critical care time: 35 minutes  Critical care time was exclusive of separately billable procedures and treating other patients.  Critical care was necessary to treat or prevent imminent or life-threatening deterioration.  Critical care was time spent personally by me on the following activities: development of treatment plan with patient and/or surrogate as well as nursing, discussions with consultants, evaluation of patient's response to treatment, examination of patient, obtaining history from  patient or surrogate, ordering and performing treatments  and interventions, ordering and review of laboratory studies, ordering and review of radiographic studies, pulse oximetry and re-evaluation of patient's condition.  Loralie Champagne 10/06/2017 7:55 AM

## 2017-10-06 NOTE — Progress Notes (Signed)
PULMONARY / CRITICAL CARE MEDICINE   Name: Logan [name] MRN: [id number] DOB: October 18, 1950    ADMISSION DATE:  09/26/2017  HISTORY OF PRESENT ILLNESS:   71yoM with hx CHF (EF 25-30%), HTN, Afib (on ASA as outpatient, had hemoptysis when on Xarelto), OSA on CPAP, Prostate cancer, and hypermetabolic RLL mass with left adrenal nodule (diagnosed on PET scan 03/2015), admitted with CHF exacerbation and Afib with RVR. Since then he has undergone thoracentesis 7/17 and liver biopsy 7/22. Patient has been hypotensive which has limited ability to diurese him. Today his amiodarone infusion was stopped as there was concern it was causing hypotension. Then this evening patient went into Afib with RVR and Flash pulmonary edema. He then became unresponsive due to acute hypercapnea. Cardiology and Anesthesiology at bedside bagging patient. [facility]'s code pager was activated (although patient never lost a pulse) and [facility] asked PCCM ground team to report emergently to bedside. Cardiology gave history as listed above. Following intubation, patient transferred to ICU under PCCM service.    7/24 The patient developed rapid Afib with RVR and went intoi flash pulmonary edema. He had to be intubated and moved to the ICU. He is intubated presently. He is hemodynamically stable at present but is on a small dose of neosynephrine. He is on [place] Amidoarone drip.  7/25 The patient is on a wean presently. He looks quite comfortable. He is on 40% FI02.He is presently on a milrinone, amiodarone and levophed infusions. He remains on a lasix infusion as well.  7/26 The patient looks well today. He had a significant diuresis yesterday after getting Zaroxolyn. His milrinone was stopped. He failed 2 SBT trials yesterday . He develped significant tachycardia. He remains on levophed 5 mcg/min at this time    PAST MEDICAL HISTORY :  He  has a past medical history of CHF (congestive heart failure) ([place]), Chronic systolic heart  failure ([place]) (04/15/2015), Hypertension, Lung mass (40/98/1191), Metabolic syndrome (06/18/8293), OSA on CPAP, Paroxysmal atrial fibrillation ([place]) (04/15/2015), Prostate cancer ([place]), and Thrombocytopenia ([place]) (04/15/2015).  PAST SURGICAL HISTORY: He  has a past surgical history that includes Prostate biopsy; Cardiac catheterization (N/A, 04/27/2015); IR THORACENTESIS ASP PLEURAL SPACE W/IMG GUIDE (09/27/2017); TEE without cardioversion (N/A, 09/29/2017); and Cardioversion (N/A, 09/29/2017).  Allergies  Allergen Reactions  . Acyclovir And Related     No current facility-administered medications on file prior to encounter.    Current Outpatient Medications on File Prior to Encounter  Medication Sig  . aspirin 81 MG tablet Take 81 mg by mouth daily.  . carvedilol (COREG) 12.5 MG tablet Take 1 tablet (12.5 mg total) by mouth 2 (two) times daily.  . Cholecalciferol ([employer] VIT D 5000 HIGH-POTENCY) 5000 units capsule Take 5,000 Units by mouth daily.  . furosemide (LASIX) 40 MG tablet Take 1 tablet (40 mg total) by mouth daily.  . Multiple Vitamin (MULTIVITAMIN) tablet Take 1 tablet by mouth daily.  . sacubitril-valsartan (ENTRESTO) 24-26 MG Take 1 tablet by mouth 2 (two) times daily.    FAMILY HISTORY:  His family history includes Hypertension in his father; Parkinson's disease in his mother.  SOCIAL HISTORY: He  reports that he quit smoking about 43 years ago. His smoking use included cigarettes, cigars, and pipe. He quit after 6.50 years of use. He has never used smokeless tobacco. He reports that he drinks alcohol. He reports that he does not use drugs.  REVIEW OF SYSTEMS:   Review of Systems  Unable to perform ROS: Critical illness   SUBJECTIVE:  Intubated, Sedated   VITAL SIGNS: BP 105/72   Pulse 98   Temp (!) 100.5 F (38.1 C) (Axillary)   Resp (!) 25   Ht '5\' 11"'$  (1.803 m)   Wt 228 lb 13.4 oz (103.8 kg)   SpO2 100%   BMI 31.92 kg/m   HEMODYNAMICS: CVP:  [7 mmHg-18 mmHg] 7  mmHgNormotensive   VENTILATOR SETTINGS: Vent Mode: PRVC FiO2 (%):  [40 %] 40 % Set Rate:  [24 bmp] 24 bmp Vt Set:  [600 mL] 600 mL PEEP:  [5 cmH20] 5 cmH20 Pressure Support:  [12 cmH20] 12 cmH20 Plateau Pressure:  [14 cmH20-27 cmH20] 24 cmH20  INTAKE / OUTPUT: I/O last 3 completed shifts: In: 4840.3 [I.V.:3264.1; Other:1325; NG/GT:150; IV Piggyback:101.2] Out: 5400 [Urine:5400]  PHYSICAL EXAMINATION: General: WDWN Adult male, Intubated but awke Neuro: the patient is responsive and following commmands HEENT: Orally intubated, OP clear, MM moist  Cardiovascular: Irreg irreg with HR in   Lungs: Coarse rhonchi bilaterally; Bloody appearing pulmonary edema in ETT Abdomen: Obese soft NTND Musculoskeletal: 1+ BLE edema  Skin: no rashes   LABS:  BMET Recent Labs  Lab 10/04/17 0512 10/05/17 0308 10/06/17 0326  NA 141 136 128*  K 3.9 3.7 2.7*  CL 101 97* 88*  CO2 '29 27 25  '$ BUN '15 16 17  '$ CREATININE 1.56* 1.87* 1.89*  GLUCOSE 124* 138* 382*   Electrolytes Recent Labs  Lab 10/03/17 2347 10/04/17 0512 10/05/17 0308 10/06/17 0326  CALCIUM 8.8* 8.5* 8.1* 8.1*  MG 2.2 2.0  --   --   PHOS 6.8* 4.3  --   --    CBC Recent Labs  Lab 10/04/17 0512 10/05/17 0308 10/06/17 0326  WBC 10.0 10.6* 10.0  HGB 10.3* 10.3* 9.7*  HCT 32.0* 31.0* 28.9*  PLT 227 216 218   Coag's Recent Labs  Lab 10/06/17 0326  INR 1.53   Sepsis Markers Recent Labs  Lab 10/03/17 2347 10/04/17 0211  LATICACIDVEN 3.9* 3.1*  PROCALCITON <0.10  --     ABG Recent Labs  Lab 10/03/17 2312 10/04/17 0032 10/04/17 0435  PHART 7.103* 7.364 7.422  PCO2ART 103* 52.1* 46.7  PO2ART 117* 117* 111*    Liver Enzymes Recent Labs  Lab 10/06/17 0326  AST 27  ALT 24  ALKPHOS 61  BILITOT 1.0  ALBUMIN 2.2*    Cardiac Enzymes Recent Labs  Lab 10/03/17 2347 10/04/17 0512 10/04/17 1046  TROPONINI 0.04* 0.04* 0.04*    Glucose Recent Labs  Lab 10/05/17 0824 10/05/17 1241 10/05/17 1945  10/05/17 2319 10/06/17 0319 10/06/17 0738  GLUCAP 130* 131* 124* 119* 128* 112*    Imaging Dg Chest Port 1 View  Result Date: 10/05/2017 CLINICAL DATA:  Acute respiratory failure EXAM: PORTABLE CHEST 1 VIEW COMPARISON:  10/04/2016, 10/03/2017, 09/27/2017, CT chest 09/26/2017 FINDINGS: Endotracheal tube tip is about 2.6 cm superior to the carina. Right upper extremity catheter tip overlies the SVC. Esophageal tube tip is below the diaphragm but not well seen. Cardiomegaly with vascular congestion and mild edema. Small moderate right pleural effusion and small left pleural effusion. Similar appearance of dense airspace disease at the right lung base. IMPRESSION: 1. Endotracheal tube tip about 2.6 cm superior to carina. 2. Continued cardiomegaly with vascular congestion and pulmonary edema. 3. Small moderate right pleural effusion and small left pleural effusion. Continued dense airspace disease at the right middle lobe and right base. Electronically Signed   By: Donavan Foil M.D.   On: 10/05/2017 20:37    STUDIES:  CTA PE 03/2014 >>  neg for PE; A large right infrahilar soft tissue mass is identified measuring 7.7 x 6.1 cm in transaxial dimensions by 5.8 cm in craniocaudal height. This demonstrates heterogeneous enhancement. Mild diffuse groundglass densities are identified throughout the lungs;  mildly enlarged precarinal lymph node measuring 1.4 cm in short axis.  PET 04/07/2015  >> 1.  Hypermetabolic right lower lobe mass max SUV 13.2 measuring 6.2 x 7.9 cm (image128). The mass previously measured 6.1 x 7.7 cm 2.  12 mm hypermetabolic left adrenal nodule (image 155). 3.  No other worrisome hypermetabolic activity is identified. 4.  No hypermetabolic 4 mm subpleural right upper lobe nodule (image 24) 2 small            accurately characterize by PET.  09/26/2017 CT chest without contrast >>  1. Large RIGHT lower lobe mass with central and peripheral calcifications occupies the near entirety  of the RIGHT lower lobe and concerning for neoplasm. 2. Moderate RIGHT effusion. 3. Mild mediastinal and RIGHT hilar adenopathy. 4. Recommend FDG PET scan for biopsy planning/staging  7/17 CT abd and pelvis >>  1. Large complex masses in the right lower lobe of the lung and in the right hepatic lobe. Smaller liver lesions in the left hepatic lobe. Nonspecific lesions in the spleen and kidneys. No significant pathologic adenopathy. This would not be a characteristic metastatic pattern for prostate cancer and is not a specific metastatic and imaging pattern to suggest an individual etiology. Possibilities might include metastatic lung cancer, melanoma, metastatic hepatocellular carcinoma, or a variety of other possibilities. Tissue diagnosis recommended. 2. Moderate prostatomegaly. 3. Scattered mild ascites. 4. Moderate cardiomegaly. Small inferior pericardial effusion. 5. Trace bilateral pleural effusions  CULTURES: Sputum culture: ordered   ANTIBIOTICS: None   SIGNIFICANT EVENTS: 7/16 Admit to cardiology  7/23: Afib with RVR >> Flash pulm edema >> Respiratory failure requiring intubation  LINES/TUBES: PIV's Foley catheter 7/23>> ETT 7/23>> OG tube 7/23>>  DISCUSSION: 66yoM with hx CHF (EF 25-30%), HTN, Afib (on ASA as outpatient, had hemoptysis when on Xarelto), OSA on CPAP, Prostate cancer, and hypermetabolic RLL mass with left adrenal nodule (diagnosed on PET scan 03/2015), admitted with CHF exacerbation and Afib with RVR. Developed Afib with RVR and Flash pulmonary edema on 7/23 PM requiring intubation.   ASSESSMENT / PLAN:  PULMONARY 1. Acute Hypoxic and Hypercapneic Respiratory failure; Flash pulmonary edema - reviewed ABG showing severe acute-on-chronic hypercapnea (7.10/103/117/30); increased RR from 20 to 24 on vent. Repeat ABG in 1 hour - received '40mg'$  Lasix IV during intubation; will start lasix infusion - CXR on my review shows diffuse bilateral pulmonary  edema 2. RLL Mass: hypermetabolic on PET scan, likely malignant in nature. Biopsy of liver met pending.  3. Right pleural effusion: s/p thoracentesis 7/17 with cytology showing reactive mesothelial cells 7/24 The patient is presently intubated and sedated. He is on full vent support. He is on 40% FI02 and PEEP 5 No plan on weaning patient today. Have not seen result onpleural fluid cytology yet.  7/25 The patient is doing relatively well on a wean.   He has decent TVs on PS of 10. TVs are well into the 400 range. Minute ventialtion <10 lpm. I am hopeful thepatient can be weaned today.   CARDIOVASCULAR 1. Afib with RVR; Acute systolic CHF exacerbation: - unsuccessful cardioversion on 7/19, returning to Afib following procedure.   - continue amiodarone infusion - Echo now shows EF 10-15% from baseline of 25%, likely worsening EF  due to the Afib RVR.  He diuresed about 1245 yesterday but was stilll 1378 ahead. He remains on the lasix drip at 10 mg /hr. Difficult to press for more aggressive diuresis in the face of his hypotension. His central venous 02 sat was 60% so the muilrinoine was added. 7/26 Milrinone was stopped yesterday. Amiodarone has been cut in 1/2 to '1mg'$ /min. The patient remains on Norepi 5 mcg/minpresently. He is off the lasix drip at this time. He did have a net diuresis of 1954 in the last 24 hours. mCentral venous sat about 62% which remains a bit low.    RENAL 1. AKI: -The patient had a crewatinine fo close to 1 3-4 days ago. Unfortunately, in an effort to mobilize fluid we have had to push the diuretics and his creatinie has climbed to 1.89 presently    GASTROINTESTINAL 1. Anemia: - his Hb has trickled down to 9.7. The patient  Does have occasibnal hemoptysis which has been longstanding. No evidnece of gross gi bleed  HEMATOLOGIC/Oncologic 1. Hypermetabolic RLL mass, Left adrenal mass, and Hepatic mass:  - suspicious for metastatic lung cancer with mets  to the liver and adrenal gland. Initial concern was for a malignant pleural effusion as well, although cytology was negative. Liver biopsy on 7/22 is still pending. Although he has a history of prostate cancer, it would be unusual for it to metastasize to the lung in this fashion.  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 His wife is awre of these results.  Once he is more stable willneed to discuss possible therapy with oncology.  INFECTIOUS The grew out a few colonies of H flu.was placed on Rocephin. Unsure if it represents true pneumonia  ENDOCRINE 1. Hyperglycemia: no hx of DM - His Blood sugars are elevated. I can tighten the coverage and start some Lantus.  NEUROLOGIC Patient is awake and following commands  FAMILY  - Updated patient's wife at bedside  - Inter-disciplinary family meet or Palliative Care meeting due by: 10/09/17    Criticalcare 45 minutes In the long run the patient is not likely to do well given his severely depressed LV fn.  Micheal Likens MD Pulmonary and [place] Pager: [phone]  10/06/2017, 8:06 AM

## 2017-10-06 NOTE — Progress Notes (Signed)
ANTICOAGULATION CONSULT NOTE - Follow-Up  Pharmacy Consult for Heparin Indication: atrial fibrillation  Allergies  Allergen Reactions  . Acyclovir And Related     Patient Measurements: Height: 5\' 11"  (180.3 cm) Weight: 228 lb 13.4 oz (103.8 kg) IBW/kg (Calculated) : 75.3 Heparin Dosing Weight: 97kg  Vital Signs: Temp: 97.8 F (36.6 C) (07/26 1158) Temp Source: Oral (07/26 1158) BP: 106/84 (07/26 1100) Pulse Rate: 51 (07/26 1100)  Labs: Recent Labs    10/03/17 2347 10/04/17 0512  10/04/17 1046 10/05/17 0308 10/05/17 1949 10/06/17 0326 10/06/17 1324  HGB 11.5* 10.3*  --   --  10.3*  --  9.7*  --   HCT 37.2* 32.0*  --   --  31.0*  --  28.9*  --   PLT 295 227  --   --  216  --  218  --   LABPROT  --   --   --   --   --   --  18.3*  --   INR  --   --   --   --   --   --  1.53  --   HEPARINUNFRC  --   --    < >  --  0.57 0.12* 0.19* 0.31  CREATININE 1.55* 1.56*  --   --  1.87*  --  1.89*  --   TROPONINI 0.04* 0.04*  --  0.04*  --   --   --   --    < > = values in this interval not displayed.    Estimated Creatinine Clearance: 46.5 mL/min (A) (by C-G formula based on SCr of 1.89 mg/dL (H)).   Medical History: Past Medical History:  Diagnosis Date  . CHF (congestive heart failure) ([place])   . Chronic systolic heart failure ([place]) 04/15/2015    EF 30-35% by echo 2016 , [facility]   . Hypertension   . Lung mass 04/15/2015    Never biopsied. History of PET scan that raised concern.   . Metabolic syndrome 05/19/1694  . OSA on CPAP   . Paroxysmal atrial fibrillation ([place]) 04/15/2015   On apixaban   . Prostate cancer ([place])   . Thrombocytopenia ([place]) 04/15/2015    Medications:  Infusions:  . sodium chloride    . sodium chloride Stopped (10/06/17 1027)  . amiodarone 30 mg/hr (10/06/17 1100)  . cefTRIAXone (ROCEPHIN)  IV Stopped (10/06/17 0024)  . fentaNYL infusion INTRAVENOUS Stopped (10/06/17 0804)  . heparin 1,800 Units/hr (10/06/17 1100)  . norepinephrine (LEVOPHED) Adult  infusion 3 mcg/min (10/06/17 1306)  . phenylephrine (NEO-SYNEPHRINE) Adult infusion Stopped (10/04/17 1046)    Assessment: 47 yom presented to the ED with afib with RVR from cardiologist office. Pharmacy consulted to dose heparin. He has been on xarelto in the past but stopped taking with ongoing hemoptysis. Pt failed DCCV 7/19 and went into rapid AFib RVR 7/23 pm requiring intubation.   Heparin level this afternoon came back at 0.31, therapeutic, on 1800 units/hr. Hgb 9.7, plt 218. No s/sx of bleeding outside of blood in ET tube this morning - now extubated. Scr stable around 1.89.   LFT's and INR were close to normal on 7/16, will recheck in am.   Goal of Therapy:  Heparin level 0.3-0.7 units/ml Monitor platelets by anticoagulation protocol: Yes   Plan:  -Continue heparin level at 1800 units/hr -Monitor daily heparin level and CBC, s/sx bleeding  Doylene Canard, PharmD Clinical Pharmacist  Pager: [phone] Phone: [phone] 10/06/2017 2:10 PM

## 2017-10-07 LAB — GLUCOSE, CAPILLARY
GLUCOSE-CAPILLARY: 117 mg/dL — AB (ref 70–99)
GLUCOSE-CAPILLARY: 133 mg/dL — AB (ref 70–99)
GLUCOSE-CAPILLARY: 84 mg/dL (ref 70–99)
Glucose-Capillary: 127 mg/dL — ABNORMAL HIGH (ref 70–99)
Glucose-Capillary: 97 mg/dL (ref 70–99)

## 2017-10-07 LAB — COOXEMETRY PANEL
Carboxyhemoglobin: 1.3 % (ref 0.5–1.5)
Methemoglobin: 0.9 % (ref 0.0–1.5)
O2 Saturation: 54.7 %
Total hemoglobin: 18.6 g/dL — ABNORMAL HIGH (ref 12.0–16.0)

## 2017-10-07 LAB — BASIC METABOLIC PANEL
ANION GAP: 9 (ref 5–15)
BUN: 19 mg/dL (ref 8–23)
CALCIUM: 8.7 mg/dL — AB (ref 8.9–10.3)
CO2: 30 mmol/L (ref 22–32)
Chloride: 96 mmol/L — ABNORMAL LOW (ref 98–111)
Creatinine, Ser: 1.62 mg/dL — ABNORMAL HIGH (ref 0.61–1.24)
GFR, EST AFRICAN AMERICAN: 49 mL/min — AB (ref >60)
GFR, EST NON AFRICAN AMERICAN: 42 mL/min — AB (ref >60)
GLUCOSE: 111 mg/dL — AB (ref 70–99)
POTASSIUM: 3.7 mmol/L (ref 3.5–5.1)
SODIUM: 135 mmol/L (ref 135–145)

## 2017-10-07 LAB — HEMOGLOBIN A1C
Hgb A1c MFr Bld: 6.9 % — ABNORMAL HIGH (ref 4.8–5.6)
MEAN PLASMA GLUCOSE: 151.33 mg/dL

## 2017-10-07 LAB — CBC
HCT: 30.9 % — ABNORMAL LOW (ref 39.0–52.0)
Hemoglobin: 10.1 g/dL — ABNORMAL LOW (ref 13.0–17.0)
MCH: 29.2 pg (ref 26.0–34.0)
MCHC: 32.7 g/dL (ref 30.0–36.0)
MCV: 89.3 fL (ref 78.0–100.0)
PLATELETS: 236 10*3/uL (ref 150–400)
RBC: 3.46 MIL/uL — ABNORMAL LOW (ref 4.22–5.81)
RDW: 14.4 % (ref 11.5–15.5)
WBC: 10.1 10*3/uL (ref 4.0–10.5)

## 2017-10-07 LAB — HEPARIN LEVEL (UNFRACTIONATED): Heparin Unfractionated: 0.34 IU/mL (ref 0.30–0.70)

## 2017-10-07 MED ORDER — PANTOPRAZOLE SODIUM 40 MG PO TBEC
40.0000 mg | DELAYED_RELEASE_TABLET | Freq: Every day | ORAL | Status: DC
  Administered 2017-10-07 – 2017-10-12 (×6): 40 mg via ORAL
  Filled 2017-10-07 (×6): qty 1

## 2017-10-07 MED ORDER — SPIRONOLACTONE 12.5 MG HALF TABLET
12.5000 mg | ORAL_TABLET | Freq: Every day | ORAL | Status: DC
Start: 2017-10-07 — End: 2017-10-08
  Administered 2017-10-07: 12.5 mg via ORAL
  Filled 2017-10-07 (×2): qty 1

## 2017-10-07 MED ORDER — INSULIN ASPART 100 UNIT/ML ~~LOC~~ SOLN
0.0000 [IU] | Freq: Three times a day (TID) | SUBCUTANEOUS | Status: DC
  Administered 2017-10-08: 2 [IU] via SUBCUTANEOUS
  Administered 2017-10-10: 3 [IU] via SUBCUTANEOUS

## 2017-10-07 NOTE — Progress Notes (Signed)
PULMONARY / CRITICAL CARE MEDICINE   Name: Logan [name] MRN: [id number] DOB: April 24, 1950    ADMISSION DATE:  09/26/2017  HISTORY OF PRESENT ILLNESS:   42yoM with hx CHF (EF 25-30%), HTN, Afib (on ASA as outpatient, had hemoptysis when on Xarelto), OSA on CPAP, Prostate cancer, and hypermetabolic RLL mass with left adrenal nodule (diagnosed on PET scan 03/2015), admitted with CHF exacerbation and Afib with RVR. Since then he has undergone thoracentesis 7/17 and liver biopsy 7/22. Patient has been hypotensive which has limited ability to diurese him. Today his amiodarone infusion was stopped as there was concern it was causing hypotension. Then this evening patient went into Afib with RVR and Flash pulmonary edema. He then became unresponsive due to acute hypercapnea. Cardiology and Anesthesiology at bedside bagging patient. [facility]'s code pager was activated (although patient never lost a pulse) and [facility] asked PCCM ground team to report emergently to bedside. Cardiology gave history as listed above. Following intubation, patient transferred to ICU under PCCM service.    7/24 The patient developed rapid Afib with RVR and went intoi flash pulmonary edema. He had to be intubated and moved to the ICU. He is intubated presently. He is hemodynamically stable at present but is on a small dose of neosynephrine. He is on [place] Amidoarone drip.  7/25 The patient is on a wean presently. He looks quite comfortable. He is on 40% FI02.He is presently on a milrinone, amiodarone and levophed infusions. He remains on a lasix infusion as well.  7/26 The patient looks well today. He had a significant diuresis yesterday after getting Zaroxolyn. His milrinone was stopped. He failed 2 SBT trials yesterday . He develped significant tachycardia. He remains on levophed 5 mcg/min at this time.  7/27 The patient was extubated yesterday. He is breathing ok. He has hemoptysis pretty minor but now has a bit of  hematuria as well. May need to consider stopping the heparin for now. He remans on a small dose of levophed presently about 2 mcg/min.    PAST MEDICAL HISTORY :  He  has a past medical history of CHF (congestive heart failure) ([place]), Chronic systolic heart failure ([place]) (04/15/2015), Hypertension, Lung mass (47/65/4650), Metabolic syndrome (05/17/4654), OSA on CPAP, Paroxysmal atrial fibrillation ([place]) (04/15/2015), Prostate cancer ([place]), and Thrombocytopenia ([place]) (04/15/2015).  PAST SURGICAL HISTORY: He  has a past surgical history that includes Prostate biopsy; Cardiac catheterization (N/A, 04/27/2015); IR THORACENTESIS ASP PLEURAL SPACE W/IMG GUIDE (09/27/2017); TEE without cardioversion (N/A, 09/29/2017); and Cardioversion (N/A, 09/29/2017).  Allergies  Allergen Reactions  . Acyclovir And Related     No current facility-administered medications on file prior to encounter.    Current Outpatient Medications on File Prior to Encounter  Medication Sig  . aspirin 81 MG tablet Take 81 mg by mouth daily.  . carvedilol (COREG) 12.5 MG tablet Take 1 tablet (12.5 mg total) by mouth 2 (two) times daily.  . Cholecalciferol ([employer] VIT D 5000 HIGH-POTENCY) 5000 units capsule Take 5,000 Units by mouth daily.  . furosemide (LASIX) 40 MG tablet Take 1 tablet (40 mg total) by mouth daily.  . Multiple Vitamin (MULTIVITAMIN) tablet Take 1 tablet by mouth daily.  . sacubitril-valsartan (ENTRESTO) 24-26 MG Take 1 tablet by mouth 2 (two) times daily.    FAMILY HISTORY:  His family history includes Hypertension in his father; Parkinson's disease in his mother.  SOCIAL HISTORY: He  reports that he quit smoking about 43 years ago. His smoking use included cigarettes, cigars, and pipe.  He quit after 6.50 years of use. He has never used smokeless tobacco. He reports that he drinks alcohol. He reports that he does not use drugs.  REVIEW OF SYSTEMS:   Review of Systems  Unable to perform ROS: Critical illness    SUBJECTIVE:  Intubated, Sedated   VITAL SIGNS: BP 114/90 (BP Location: Left Arm)   Pulse (!) 115   Temp 98 F (36.7 C) (Oral)   Resp (!) 21   Ht '5\' 11"'$  (1.803 m)   Wt 221 lb 12.5 oz (100.6 kg)   SpO2 100%   BMI 30.93 kg/m   HEMODYNAMICS: CVP:  [7 mmHg-15 mmHg] 15 mmHgNormotensive   VENTILATOR SETTINGS: Vent Mode: PSV;CPAP FiO2 (%):  [40 %] 40 % PEEP:  [5 cmH20] 5 cmH20 Pressure Support:  [14 cmH20] 14 cmH20  INTAKE / OUTPUT: I/O last 3 completed shifts: In: 3766.4 [P.O.:370; I.V.:2516.1; Other:500; NG/GT:30; IV Piggyback:350.3] Out: 4285 [Urine:4285]  PHYSICAL EXAMINATION: General: WDWN Adult male, Intubated but awke Neuro: the patient is responsive and following commmands HEENT: Orally intubated, OP clear, MM moist  Cardiovascular: Irreg irreg with HR in   Lungs: Coarse rhonchi bilaterally; Bloody appearing pulmonary edema in ETT Abdomen: Obese soft NTND Musculoskeletal: 1+ BLE edema  Skin: no rashes   LABS:  BMET Recent Labs  Lab 10/06/17 0326 10/06/17 1324 10/07/17 0317  NA 128* 135 135  K 2.7* 3.6 3.7  CL 88* 95* 96*  CO2 '25 29 30  '$ BUN '17 18 19  '$ CREATININE 1.89* 1.82* 1.62*  GLUCOSE 382* 120* 111*   Electrolytes Recent Labs  Lab 10/03/17 2347 10/04/17 0512  10/06/17 0326 10/06/17 0728 10/06/17 1324 10/07/17 0317  CALCIUM 8.8* 8.5*   < > 8.1*  --  8.6* 8.7*  MG 2.2 2.0  --   --  1.8  --   --   PHOS 6.8* 4.3  --   --   --   --   --    < > = values in this interval not displayed.   CBC Recent Labs  Lab 10/05/17 0308 10/06/17 0326 10/07/17 0313  WBC 10.6* 10.0 10.1  HGB 10.3* 9.7* 10.1*  HCT 31.0* 28.9* 30.9*  PLT 216 218 236   Coag's Recent Labs  Lab 10/06/17 0326  INR 1.53   Sepsis Markers Recent Labs  Lab 10/03/17 2347 10/04/17 0211  LATICACIDVEN 3.9* 3.1*  PROCALCITON <0.10  --     ABG Recent Labs  Lab 10/03/17 2312 10/04/17 0032 10/04/17 0435  PHART 7.103* 7.364 7.422  PCO2ART 103* 52.1* 46.7  PO2ART 117*  117* 111*    Liver Enzymes Recent Labs  Lab 10/06/17 0326  AST 27  ALT 24  ALKPHOS 61  BILITOT 1.0  ALBUMIN 2.2*    Cardiac Enzymes Recent Labs  Lab 10/03/17 2347 10/04/17 0512 10/04/17 1046  TROPONINI 0.04* 0.04* 0.04*    Glucose Recent Labs  Lab 10/06/17 1156 10/06/17 1600 10/06/17 2000 10/06/17 2335 10/07/17 0316 10/07/17 0751  GLUCAP 114* 110* 169* 111* 117* 127*    Imaging No results found.  STUDIES:  CTA PE 03/2014 >>  neg for PE; A large right infrahilar soft tissue mass is identified measuring 7.7 x 6.1 cm in transaxial dimensions by 5.8 cm in craniocaudal height. This demonstrates heterogeneous enhancement. Mild diffuse groundglass densities are identified throughout the lungs;  mildly enlarged precarinal lymph node measuring 1.4 cm in short axis.  PET 04/07/2015  >> 1.  Hypermetabolic right lower lobe mass max SUV 13.2 measuring  6.2 x 7.9 cm (image128). The mass previously measured 6.1 x 7.7 cm 2.  12 mm hypermetabolic left adrenal nodule (image 155). 3.  No other worrisome hypermetabolic activity is identified. 4.  No hypermetabolic 4 mm subpleural right upper lobe nodule (image 24) 2 small            accurately characterize by PET.  09/26/2017 CT chest without contrast >>  1. Large RIGHT lower lobe mass with central and peripheral calcifications occupies the near entirety of the RIGHT lower lobe and concerning for neoplasm. 2. Moderate RIGHT effusion. 3. Mild mediastinal and RIGHT hilar adenopathy. 4. Recommend FDG PET scan for biopsy planning/staging  7/17 CT abd and pelvis >>  1. Large complex masses in the right lower lobe of the lung and in the right hepatic lobe. Smaller liver lesions in the left hepatic lobe. Nonspecific lesions in the spleen and kidneys. No significant pathologic adenopathy. This would not be a characteristic metastatic pattern for prostate cancer and is not a specific metastatic and imaging pattern to suggest an individual  etiology. Possibilities might include metastatic lung cancer, melanoma, metastatic hepatocellular carcinoma, or a variety of other possibilities. Tissue diagnosis recommended. 2. Moderate prostatomegaly. 3. Scattered mild ascites. 4. Moderate cardiomegaly. Small inferior pericardial effusion. 5. Trace bilateral pleural effusions  CULTURES: Sputum culture: ordered   ANTIBIOTICS: None   SIGNIFICANT EVENTS: 7/16 Admit to cardiology  7/23: Afib with RVR >> Flash pulm edema >> Respiratory failure requiring intubation  LINES/TUBES: PIV's Foley catheter 7/23>> ETT 7/23>> OG tube 7/23>>  DISCUSSION: 66yoM with hx CHF (EF 25-30%), HTN, Afib (on ASA as outpatient, had hemoptysis when on Xarelto), OSA on CPAP, Prostate cancer, and hypermetabolic RLL mass with left adrenal nodule (diagnosed on PET scan 03/2015), admitted with CHF exacerbation and Afib with RVR. Developed Afib with RVR and Flash pulmonary edema on 7/23 PM requiring intubation.   ASSESSMENT / PLAN:  PULMONARY 1. Acute Hypoxic and Hypercapneic Respiratory failure; Flash pulmonary edema - reviewed ABG showing severe acute-on-chronic hypercapnea (7.10/103/117/30); increased RR from 20 to 24 on vent. Repeat ABG in 1 hour - received '40mg'$  Lasix IV during intubation; will start lasix infusion - CXR on my review shows diffuse bilateral pulmonary edema 2. RLL Mass: hypermetabolic on PET scan, likely malignant in nature. Biopsy of liver met pending.  3. Right pleural effusion: s/p thoracentesis 7/17 with cytology showing reactive mesothelial cells 7/24 The patient is presently intubated and sedated. He is on full vent support. He is on 40% FI02 and PEEP 5 No plan on weaning patient today. Have not seen result onpleural fluid cytology yet.  7/25 The patient is doing relatively well on a wean.   He has decent TVs on PS of 10. TVs are well into the 400 range. Minute ventialtion <10 lpm. I am hopeful thepatient can be weaned  today.   CARDIOVASCULAR 1. Afib with RVR; Acute systolic CHF exacerbation: - unsuccessful cardioversion on 7/19, returning to Afib following procedure.   - continue amiodarone infusion - Echo now shows EF 10-15% from baseline of 25%, likely worsening EF due to the Afib RVR.  He diuresed about 1245 yesterday but was stilll 1378 ahead. He remains on the lasix drip at 10 mg /hr. Difficult to press for more aggressive diuresis in the face of his hypotension. His central venous 02 sat was 60% so the muilrinoine was added. 7/26 Milrinone was stopped yesterday. Amiodarone has been cut in 1/2 to '1mg'$ /min. The patient remains on Norepi 5 mcg/minpresently. He  is off the lasix drip at this time. He did have a net diuresis of 1954 in the last 24 hours. mCentral venous sat about 62% which remains a bit low. 7/27 I feel compellled to turn off heparin with his persistent hemoptysis and now hematuria. He is on Amiodarone at 30 mg/hr. His heart rate still jumps into the 1110-120 range at times    RENAL 1. AKI: -The patient had a crewatinine fo close to 1 3-4 days ago. Unfortunately, in an effort to mobilize fluid we have had to push the diuretics and his creatinie has climbed to 1.89 presently. 7/27 Creatinine is 1.62.  His output was 2160 in the past 24 hours. He remains on lasix 60 mg bid.   GASTROINTESTINAL 1. Anemia: - his Hb has trickled down to 9.7. The patient  Does have occasibnal hemoptysis which has been longstanding. No evidnece of gross gi bleed Hb is stable  HEMATOLOGIC/Oncologic 1. 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 Initial concern was for a malignant pleural effusion as well, although cytology was negative. Liver biopsy on 7/22 is still pending. Although he has a history of prostate cancer, it would be unusual for it to metastasize to the lung in this fashion.  The  results of the loiver biopsy came back late yesterday and showed what apears to be metastatic adenocarcinoma form the lung. His wife is awre of these results.  Once he is more stable will need to discuss possible therapy with oncology.  INFECTIOUS The grew out a few colonies of H flu.was placed on Rocephin. Unsure if it represents true pneumonia  ENDOCRINE 1. Hyperglycemia: no hx of DM - His Blood sugars are elevated. I can tighten the coverage and start some Lantus. 7/27 Change  gluometers to ac and HS  NEUROLOGIC Patient is awake and following commands  FAMILY  - Updated patient's wife at bedside  - Inter-disciplinary family meet or Palliative Care meeting due by: 10/09/17     In the long run the patient is not likely to do well given his severely depressed LV fn. And multipl, other issues.  Micheal Likens MD Pulmonary and [place] Pager: [phone]  10/07/2017, 8:04 AM

## 2017-10-07 NOTE — Progress Notes (Signed)
Advanced Heart Failure Rounding Note  PCP-Cardiologist: Sinclair Grooms, MD   Subjective:    Extubated yesterday. Feeling better.   Failed milrinone due to AF with RVR. AF this am with HR 100-120 on amio 30. Now 70-80. On NE 3. Lasix gtt stopped switched to lasix 60 bid. CVP today 16->10.  Co-ox 55%  Having some hemoptysis and hematuria. Heparin off. Denies CP or SOB.   Objective:   Weight Range: 100.6 kg (221 lb 12.5 oz) Body mass index is 30.93 kg/m.   Vital Signs:   Temp:  [97.8 F (36.6 C)-98.1 F (36.7 C)] 98.1 F (36.7 C) (07/27 1139) Pulse Rate:  [58-125] 89 (07/27 1600) Resp:  [16-31] 25 (07/27 1600) BP: (96-126)/(69-107) 116/77 (07/27 1600) SpO2:  [92 %-100 %] 92 % (07/27 1600) Weight:  [100.6 kg (221 lb 12.5 oz)] 100.6 kg (221 lb 12.5 oz) (07/27 0615) Last BM Date: 10/07/17  Weight change: Filed Weights   10/05/17 0457 10/06/17 0500 10/07/17 0615  Weight: 106.6 kg (235 lb 0.2 oz) 103.8 kg (228 lb 13.4 oz) 100.6 kg (221 lb 12.5 oz)    Intake/Output:   Intake/Output Summary (Last 24 hours) at 10/07/2017 1622 Last data filed at 10/07/2017 1600 Gross per 24 hour  Intake 1883.6 ml  Output 2690 ml  Net -806.4 ml      Physical Exam   General:  Sitting up in bed. No resp difficulty HEENT: normal Neck: supple. JVP to jaw Carotids 2+ bilat; no bruits. No lymphadenopathy or thryomegaly appreciated. Cor: PMI laterally displaced. Regular rate & rhythm. No rubs, gallops or murmurs. Lungs: clear Abdomen: soft, nontender, nondistended. No hepatosplenomegaly. No bruits or masses. Good bowel sounds. Extremities: no cyanosis, clubbing, rash, edema Neuro: alert & orientedx3, cranial nerves grossly intact. moves all 4 extremities w/o difficulty. Affect pleasant  Telemetry   AF 70-80s currently was 100-120 this am Personally reviewed   Labs    CBC Recent Labs    10/06/17 0326 10/07/17 0313  WBC 10.0 10.1  HGB 9.7* 10.1*  HCT 28.9* 30.9*  MCV 89.2  89.3  PLT 218 253   Basic Metabolic Panel Recent Labs    10/06/17 0728 10/06/17 1324 10/07/17 0317  NA  --  135 135  K  --  3.6 3.7  CL  --  95* 96*  CO2  --  29 30  GLUCOSE  --  120* 111*  BUN  --  18 19  CREATININE  --  1.82* 1.62*  CALCIUM  --  8.6* 8.7*  MG 1.8  --   --    Liver Function Tests Recent Labs    10/06/17 0326  AST 27  ALT 24  ALKPHOS 61  BILITOT 1.0  PROT 6.9  ALBUMIN 2.2*   No results for input(s): LIPASE, AMYLASE in the last 72 hours. Cardiac Enzymes No results for input(s): CKTOTAL, CKMB, CKMBINDEX, TROPONINI in the last 72 hours.  BNP: BNP (last 3 results) Recent Labs    09/26/17 0955 10/03/17 2347  BNP 537.4* 362.5*    ProBNP (last 3 results) No results for input(s): PROBNP in the last 8760 hours.   D-Dimer No results for input(s): DDIMER in the last 72 hours. Hemoglobin A1C No results for input(s): HGBA1C in the last 72 hours. Fasting Lipid Panel No results for input(s): CHOL, HDL, LDLCALC, TRIG, CHOLHDL, LDLDIRECT in the last 72 hours. Thyroid Function Tests No results for input(s): TSH, T4TOTAL, T3FREE, THYROIDAB in the last 72 hours.  Invalid  input(s): FREET3  Other results:   Imaging    No results found.   Medications:     Scheduled Medications: . Chlorhexidine Gluconate Cloth  6 each Topical Daily  . furosemide  60 mg Intravenous BID  . insulin aspart  0-15 Units Subcutaneous Q4H  . insulin glargine  10 Units Subcutaneous Daily  . pantoprazole  40 mg Oral Daily  . sodium chloride flush  10-40 mL Intracatheter Q12H  . sodium chloride flush  3 mL Intravenous Q12H    Infusions: . sodium chloride    . sodium chloride Stopped (10/07/17 1118)  . amiodarone 30 mg/hr (10/07/17 1600)  . cefTRIAXone (ROCEPHIN)  IV Stopped (10/06/17 2210)  . fentaNYL infusion INTRAVENOUS Stopped (10/06/17 0804)  . norepinephrine (LEVOPHED) Adult infusion 3 mcg/min (10/07/17 1600)  . phenylephrine (NEO-SYNEPHRINE) Adult infusion  Stopped (10/04/17 1046)    PRN Medications: sodium chloride, sodium chloride, acetaminophen (TYLENOL) oral liquid 160 mg/5 mL **OR** acetaminophen, docusate, ondansetron (ZOFRAN) IV, sodium chloride flush, sodium chloride flush    Patient Profile    [name] is 67 year old with history of HTN, A Fib, OSA, untreated prostate cancer 2012 , RLL mass 2015 declined treatment, hemoptysis for the last 6 months (he stopped xarelto), and chronic systolic heart failure.   Admitted with A fib RVR and A/C systolic heart failure.     Assessment/Plan   1. A/C Biventricular Heart Failure - NICM. Most recent [place] 04/2015 patent coronaries. - ECHO 08/28/17 EF 10-15%  - CO-OX 55% Remains on NE 3. Weaning as tolerated  - CVP 10. Continue lasix 60 IV bid. Add spiro   - No bb, ACE/ARB with shock and AKI  - Long-term prognosis with combination of metastatic lung cancer and severe CHF (low output) is very poor. He is not a candidate for advanced cardiac therapies.    2. A fib RVR -TEE 7/22 with smoke  - Remains in AF.Rate better controlled this afternoon.Continue amio at 29. Can increase gtt to 60 as needed - Hold heparin with hemoptysis - Place SCDs  3. Acute Hypoxic Respiratory Failure - Intubated 10/03/17 - Extubated 7/26 - CCM managing  4. Hemoptysis -6 months ago he stopped xarelto and hemoptysis resolved.  -hgb 11/5>10.3 >10.3 >9.7 > 10.1  5. AKI -Creatinine starting to improve 1.0>1.5 >1.8 >1.8> 1.6 -Continue to hold lasix -Follow daily BMET   6. Lung Mass--2015 decline treatment  -Stage IV lung cancer.  -Oncology Following -> not candidate for chemotherapy but may be candidate for immunotherapy depending on genomics.  -S/P Thoracentesis 7/17  7. 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   8. H/O Prostate Cancer 2012   9. Hyponatremia - resolved   10. Hypokalemia - k 3.7  - Supp K   CRITICAL CARE Performed  by: Glori Bickers  Total critical care time: 35 minutes  Critical care time was exclusive of separately billable procedures and treating other patients.  Critical care was necessary to treat or prevent imminent or life-threatening deterioration.  Critical care was time spent personally by me (independent of midlevel providers or residents) on the following activities: development of treatment plan with patient and/or surrogate as well as nursing, discussions with consultants, evaluation of patient's response to treatment, examination of patient, obtaining history from patient or surrogate, ordering and performing treatments and interventions, ordering and review of laboratory studies, ordering and review of radiographic studies, pulse oximetry and re-evaluation of patient's condition.   Length of Stay:  [place], MD  10/07/2017, 4:22 PM  Advanced Heart Failure Team Pager [phone] (M-F; [place])  Please contact [facility] for night-coverage after hours (4p -7a ) and weekends on [email or web address]

## 2017-10-08 LAB — BASIC METABOLIC PANEL
Anion gap: 10 (ref 5–15)
BUN: 19 mg/dL (ref 8–23)
CHLORIDE: 94 mmol/L — AB (ref 98–111)
CO2: 31 mmol/L (ref 22–32)
Calcium: 8.6 mg/dL — ABNORMAL LOW (ref 8.9–10.3)
Creatinine, Ser: 1.49 mg/dL — ABNORMAL HIGH (ref 0.61–1.24)
GFR calc Af Amer: 54 mL/min — ABNORMAL LOW (ref >60)
GFR calc non Af Amer: 47 mL/min — ABNORMAL LOW (ref >60)
GLUCOSE: 134 mg/dL — AB (ref 70–99)
POTASSIUM: 3.2 mmol/L — AB (ref 3.5–5.1)
SODIUM: 135 mmol/L (ref 135–145)

## 2017-10-08 LAB — GLUCOSE, CAPILLARY
Glucose-Capillary: 124 mg/dL — ABNORMAL HIGH (ref 70–99)
Glucose-Capillary: 101 mg/dL — ABNORMAL HIGH (ref 70–99)
Glucose-Capillary: 110 mg/dL — ABNORMAL HIGH (ref 70–99)
Glucose-Capillary: 135 mg/dL — ABNORMAL HIGH (ref 70–99)

## 2017-10-08 LAB — COOXEMETRY PANEL
Carboxyhemoglobin: 1.4 % (ref 0.5–1.5)
Methemoglobin: 1.7 % — ABNORMAL HIGH (ref 0.0–1.5)
O2 Saturation: 68.8 %
TOTAL HEMOGLOBIN: 10.4 g/dL — AB (ref 12.0–16.0)

## 2017-10-08 LAB — CBC
HCT: 29.6 % — ABNORMAL LOW (ref 39.0–52.0)
Hemoglobin: 9.8 g/dL — ABNORMAL LOW (ref 13.0–17.0)
MCH: 29.5 pg (ref 26.0–34.0)
MCHC: 33.1 g/dL (ref 30.0–36.0)
MCV: 89.2 fL (ref 78.0–100.0)
PLATELETS: 247 10*3/uL (ref 150–400)
RBC: 3.32 MIL/uL — ABNORMAL LOW (ref 4.22–5.81)
RDW: 13.9 % (ref 11.5–15.5)
WBC: 8 10*3/uL (ref 4.0–10.5)

## 2017-10-08 LAB — MAGNESIUM: MAGNESIUM: 2.2 mg/dL (ref 1.7–2.4)

## 2017-10-08 MED ORDER — POTASSIUM CHLORIDE CRYS ER 20 MEQ PO TBCR
40.0000 meq | EXTENDED_RELEASE_TABLET | Freq: Once | ORAL | Status: AC
  Administered 2017-10-08: 40 meq via ORAL
  Filled 2017-10-08: qty 2

## 2017-10-08 MED ORDER — SPIRONOLACTONE 25 MG PO TABS
25.0000 mg | ORAL_TABLET | Freq: Two times a day (BID) | ORAL | Status: DC
  Administered 2017-10-08 – 2017-10-09 (×4): 25 mg via ORAL
  Filled 2017-10-08 (×4): qty 1

## 2017-10-08 MED ORDER — FUROSEMIDE 80 MG PO TABS
80.0000 mg | ORAL_TABLET | Freq: Two times a day (BID) | ORAL | Status: DC
  Administered 2017-10-08 – 2017-10-11 (×6): 80 mg via ORAL
  Filled 2017-10-08 (×6): qty 1

## 2017-10-08 NOTE — Progress Notes (Signed)
Advanced Heart Failure Rounding Note  PCP-Cardiologist: Sinclair Grooms, MD   Subjective:    Extubated 7/26  Remains on amio gtt and lasix 60 IV bid. NE at 3. SBP in 100-110 range. Remains in AF 90-110. Feels ok. Sitting up in chair eating lunch. Denies SOB or palpitations. Hemoptysis resolved. Off heparin due to  hemoptysis and hematuria.    Objective:   Weight Range: 99.4 kg (219 lb 2.2 oz) Body mass index is 30.56 kg/m.   Vital Signs:   Temp:  [97.7 F (36.5 C)-98.4 F (36.9 C)] 98 F (36.7 C) (07/28 1131) Pulse Rate:  [51-126] 98 (07/28 1129) Resp:  [19-29] 29 (07/28 0900) BP: (97-118)/(72-93) 99/77 (07/28 0900) SpO2:  [92 %-100 %] 93 % (07/28 0900) Weight:  [99.4 kg (219 lb 2.2 oz)] 99.4 kg (219 lb 2.2 oz) (07/28 0520) Last BM Date: 10/08/17  Weight change: Filed Weights   10/06/17 0500 10/07/17 0615 10/08/17 0520  Weight: 103.8 kg (228 lb 13.4 oz) 100.6 kg (221 lb 12.5 oz) 99.4 kg (219 lb 2.2 oz)    Intake/Output:   Intake/Output Summary (Last 24 hours) at 10/08/2017 1227 Last data filed at 10/08/2017 0934 Gross per 24 hour  Intake 1097.12 ml  Output 1710 ml  Net -612.88 ml      Physical Exam   General:  Sitting up in chair. No resp difficulty HEENT: normal Neck: supple. no CVP 7. Carotids 2+ bilat; no bruits. No lymphadenopathy or thryomegaly appreciated. Cor: PMI nondisplaced. Irreg tachy . No rubs, gallops or murmurs. Lungs: clear with decreased BS at bases  Abdomen: soft, nontender, nondistended. No hepatosplenomegaly. No bruits or masses. Good bowel sounds. Extremities: no cyanosis, clubbing, rash, edema Neuro: alert & orientedx3, cranial nerves grossly intact. moves all 4 extremities w/o difficulty. Affect pleasant   Telemetry   AF 90-110Personally reviewed   Labs    CBC Recent Labs    10/07/17 0313 10/08/17 0312  WBC 10.1 8.0  HGB 10.1* 9.8*  HCT 30.9* 29.6*  MCV 89.3 89.2  PLT 236 166   Basic Metabolic Panel Recent Labs      10/06/17 0728  10/07/17 0317 10/08/17 0312  NA  --    < > 135 135  K  --    < > 3.7 3.2*  CL  --    < > 96* 94*  CO2  --    < > 30 31  GLUCOSE  --    < > 111* 134*  BUN  --    < > 19 19  CREATININE  --    < > 1.62* 1.49*  CALCIUM  --    < > 8.7* 8.6*  MG 1.8  --   --  2.2   < > = values in this interval not displayed.   Liver Function Tests Recent Labs    10/06/17 0326  AST 27  ALT 24  ALKPHOS 61  BILITOT 1.0  PROT 6.9  ALBUMIN 2.2*   No results for input(s): LIPASE, AMYLASE in the last 72 hours. Cardiac Enzymes No results for input(s): CKTOTAL, CKMB, CKMBINDEX, TROPONINI in the last 72 hours.  BNP: BNP (last 3 results) Recent Labs    09/26/17 0955 10/03/17 2347  BNP 537.4* 362.5*    ProBNP (last 3 results) No results for input(s): PROBNP in the last 8760 hours.   D-Dimer No results for input(s): DDIMER in the last 72 hours. Hemoglobin A1C Recent Labs    10/07/17 1606  HGBA1C 6.9*   Fasting Lipid Panel No results for input(s): CHOL, HDL, LDLCALC, TRIG, CHOLHDL, LDLDIRECT in the last 72 hours. Thyroid Function Tests No results for input(s): TSH, T4TOTAL, T3FREE, THYROIDAB in the last 72 hours.  Invalid input(s): FREET3  Other results:   Imaging    No results found.   Medications:     Scheduled Medications: . Chlorhexidine Gluconate Cloth  6 each Topical Daily  . furosemide  60 mg Intravenous BID  . insulin aspart  0-15 Units Subcutaneous TID WC  . insulin glargine  10 Units Subcutaneous Daily  . pantoprazole  40 mg Oral Daily  . sodium chloride flush  10-40 mL Intracatheter Q12H  . sodium chloride flush  3 mL Intravenous Q12H  . spironolactone  25 mg Oral BID    Infusions: . sodium chloride    . sodium chloride Stopped (10/07/17 2355)  . amiodarone 30 mg/hr (10/08/17 0932)  . cefTRIAXone (ROCEPHIN)  IV Stopped (10/07/17 2334)  . fentaNYL infusion INTRAVENOUS Stopped (10/06/17 0804)  . norepinephrine (LEVOPHED) Adult infusion 3  mcg/min (10/08/17 0931)  . phenylephrine (NEO-SYNEPHRINE) Adult infusion Stopped (10/04/17 1046)    PRN Medications: sodium chloride, sodium chloride, acetaminophen (TYLENOL) oral liquid 160 mg/5 mL **OR** acetaminophen, docusate, ondansetron (ZOFRAN) IV, sodium chloride flush, sodium chloride flush    Patient Profile    [name] is 67 year old with history of HTN, A Fib, OSA, untreated prostate cancer 2012 , RLL mass 2015 declined treatment, hemoptysis for the last 6 months (he stopped xarelto), and chronic systolic heart failure.   Admitted with A fib RVR and A/C systolic heart failure.   Assessment/Plan   1. A/C Biventricular Heart Failure - NICM. Most recent [place] 04/2015 patent coronaries. - ECHO 08/28/17 EF 10-15% suspect tachy-induced - CO-OX 69% Remains on NE 3. Will wean to keep SBP >= 90. Failed milrinone previously with AF with worsening RVR. - CVP 7. Switch lasix to 80 pod bid. Continue spiro - No bb, ACE/ARB with shock and AKI  - Long-term prognosis with combination of metastatic lung cancer and severe CHF (low output) is very poor. He is not a candidate for advanced cardiac therapies.    2. A fib RVR -TEE 7/22 with smoke  - Remains in AF.Rate still a bit elevated. Continue amio at 49.Possibly to po tomorrow once off NE.  -  Off heparin with hemoptysis. Previously failed Xarelto with hemoptysis so probably would not restart AC - Continue SCDs  3. Acute Hypoxic Respiratory Failure - Intubated 10/03/17 - Extubated 7/26 - CCM managing  4. Hemoptysis -6 months ago he stopped xarelto and hemoptysis resolved.  -hgb 11/5>10.3 >10.3 >9.7 > 9.8  5. AKI -Creatinine starting to improve 1.0>1.5 >1.8 >1.8> 1.6> 1.5 -Follow daily BMET   6. Lung Mass--2015 decline treatment  -Stage IV lung cancer.  -Oncology Following -> not candidate for chemotherapy but may be candidate for immunotherapy depending on genomics.  -S/P Thoracentesis 7/17  7. 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   8. H/O Prostate Cancer 2012   9. Hyponatremia - resolved   10. Hypokalemia - k 3.2 - Supp K   Length of Stay: 12  Glori Bickers, MD  10/08/2017, 12:27 PM  Advanced Heart Failure Team Pager [phone] (M-F; 7a - 4p)  Please contact [facility] for night-coverage after hours (4p -7a ) and weekends on [email or web address]

## 2017-10-08 NOTE — Progress Notes (Signed)
PULMONARY / CRITICAL CARE MEDICINE   Name: Logan [name] MRN: [id number] DOB: Apr 17, 1950    ADMISSION DATE:  09/26/2017  HISTORY OF PRESENT ILLNESS:   87yoM with hx CHF (EF 25-30%), HTN, Afib (on ASA as outpatient, had hemoptysis when on Xarelto), OSA on CPAP, Prostate cancer, and hypermetabolic RLL mass with left adrenal nodule (diagnosed on PET scan 03/2015), admitted with CHF exacerbation and Afib with RVR. Since then he has undergone thoracentesis 7/17 and liver biopsy 7/22. Patient has been hypotensive which has limited ability to diurese him. Today his amiodarone infusion was stopped as there was concern it was causing hypotension. Then this evening patient went into Afib with RVR and Flash pulmonary edema. He then became unresponsive due to acute hypercapnea. Cardiology and Anesthesiology at bedside bagging patient. [facility]'s code pager was activated (although patient never lost a pulse) and [facility] asked PCCM ground team to report emergently to bedside. Cardiology gave history as listed above. Following intubation, patient transferred to ICU under PCCM service.    7/24 The patient developed rapid Afib with RVR and went intoi flash pulmonary edema. He had to be intubated and moved to the ICU. He is intubated presently. He is hemodynamically stable at present but is on a small dose of neosynephrine. He is on [place] Amidoarone drip.  7/25 The patient is on a wean presently. He looks quite comfortable. He is on 40% FI02.He is presently on a milrinone, amiodarone and levophed infusions. He remains on a lasix infusion as well.  7/26 The patient looks well today. He had a significant diuresis yesterday after getting Zaroxolyn. His milrinone was stopped. He failed 2 SBT trials yesterday . He develped significant tachycardia. He remains on levophed 5 mcg/min at this time.  7/27 The patient was extubated yesterday. He is breathing ok. He has hemoptysis pretty minor but now has a bit of  hematuria as well. May need to consider stopping the heparin for now. He remans on a small dose of levophed presently about 2 mcg/min.  The patient is doing well. He feels the best he has in a while. He remains off the heparin because of his hemoptysis and hematuria. I am hesitant to restart the Xarelto. His respiratory status has been good. Has had aboout 1 liter out net in the past 2 days. His appetite is good. He gets up to the chair.  He remains on asmall dose of levophed at 4mg/min whic inhibits him from leaving the ICU setting yet.   PAST MEDICAL HISTORY :  He  has a past medical history of CHF (congestive heart failure) ([facility], Chronic systolic heart failure ([facility] (04/15/2015), Hypertension, Lung mass (099/37/1696, Metabolic syndrome (27/10/9379, OSA on CPAP, Paroxysmal atrial fibrillation ([facility] (04/15/2015), Prostate cancer ([facility], and Thrombocytopenia ([facility] (04/15/2015).  PAST SURGICAL HISTORY: He  has a past surgical history that includes Prostate biopsy; Cardiac catheterization (N/A, 04/27/2015); IR THORACENTESIS ASP PLEURAL SPACE W/IMG GUIDE (09/27/2017); TEE without cardioversion (N/A, 09/29/2017); and Cardioversion (N/A, 09/29/2017).  Allergies  Allergen Reactions  . Acyclovir And Related     No current facility-administered medications on file prior to encounter.    Current Outpatient Medications on File Prior to Encounter  Medication Sig  . aspirin 81 MG tablet Take 81 mg by mouth daily.  . carvedilol (COREG) 12.5 MG tablet Take 1 tablet (12.5 mg total) by mouth 2 (two) times daily.  . Cholecalciferol ([employer] VIT D 5000 HIGH-POTENCY) 5000 units capsule Take 5,000 Units by mouth daily.  . furosemide (LASIX) 40  MG tablet Take 1 tablet (40 mg total) by mouth daily.  . Multiple Vitamin (MULTIVITAMIN) tablet Take 1 tablet by mouth daily.  . sacubitril-valsartan (ENTRESTO) 24-26 MG Take 1 tablet by mouth 2 (two) times daily.    FAMILY HISTORY:  His family history includes Hypertension  in his father; Parkinson's disease in his mother.  SOCIAL HISTORY: He  reports that he quit smoking about 43 years ago. His smoking use included cigarettes, cigars, and pipe. He quit after 6.50 years of use. He has never used smokeless tobacco. He reports that he drinks alcohol. He reports that he does not use drugs.  REVIEW OF SYSTEMS:   Review of Systems  Unable to perform ROS: Critical illness   SUBJECTIVE:  Intubated, Sedated   VITAL SIGNS: BP 97/75 (BP Location: Left Arm)   Pulse (!) 105   Temp 97.7 F (36.5 C) (Oral)   Resp 19   Ht '5\' 11"'$  (1.803 m)   Wt 219 lb 2.2 oz (99.4 kg)   SpO2 100%   BMI 30.56 kg/m   HEMODYNAMICS: CVP:  [8 mmHg-17 mmHg] 10 mmHgNormotensive   VENTILATOR SETTINGS:    INTAKE / OUTPUT: I/O last 3 completed shifts: In: 2328.6 [P.O.:720; I.V.:1408.6; IV Piggyback:200] Out: 3050 [Urine:3050]  PHYSICAL EXAMINATION: General: WDWN Adult male, Intubated but awke Neuro: the patient is responsive and following commmands HEENT: Orally intubated, OP clear, MM moist  Cardiovascular: Irreg irreg with HR in   Lungs: Coarse rhonchi bilaterally; Bloody appearing pulmonary edema in ETT Abdomen: Obese soft NTND Musculoskeletal: 1+ BLE edema  Skin: no rashes   LABS:  BMET Recent Labs  Lab 10/06/17 1324 10/07/17 0317 10/08/17 0312  NA 135 135 135  K 3.6 3.7 3.2*  CL 95* 96* 94*  CO2 '29 30 31  '$ BUN '18 19 19  '$ CREATININE 1.82* 1.62* 1.49*  GLUCOSE 120* 111* 134*   Electrolytes Recent Labs  Lab 10/03/17 2347 10/04/17 0512  10/06/17 0728 10/06/17 1324 10/07/17 0317 10/08/17 0312  CALCIUM 8.8* 8.5*   < >  --  8.6* 8.7* 8.6*  MG 2.2 2.0  --  1.8  --   --  2.2  PHOS 6.8* 4.3  --   --   --   --   --    < > = values in this interval not displayed.   CBC Recent Labs  Lab 10/06/17 0326 10/07/17 0313 10/08/17 0312  WBC 10.0 10.1 8.0  HGB 9.7* 10.1* 9.8*  HCT 28.9* 30.9* 29.6*  PLT 218 236 247   Coag's Recent Labs  Lab 10/06/17 0326   INR 1.53   Sepsis Markers Recent Labs  Lab 10/03/17 2347 10/04/17 0211  LATICACIDVEN 3.9* 3.1*  PROCALCITON <0.10  --     ABG Recent Labs  Lab 10/03/17 2312 10/04/17 0032 10/04/17 0435  PHART 7.103* 7.364 7.422  PCO2ART 103* 52.1* 46.7  PO2ART 117* 117* 111*    Liver Enzymes Recent Labs  Lab 10/06/17 0326  AST 27  ALT 24  ALKPHOS 61  BILITOT 1.0  ALBUMIN 2.2*    Cardiac Enzymes Recent Labs  Lab 10/03/17 2347 10/04/17 0512 10/04/17 1046  TROPONINI 0.04* 0.04* 0.04*    Glucose Recent Labs  Lab 10/07/17 0751 10/07/17 1139 10/07/17 1615 10/07/17 1944 10/07/17 2250 10/08/17 0658  GLUCAP 127* 133* 97 124* 84 101*    Imaging No results found.  STUDIES:  CTA PE 03/2014 >>  neg for PE; A large right infrahilar soft tissue mass is identified measuring 7.7  x 6.1 cm in transaxial dimensions by 5.8 cm in craniocaudal height. This demonstrates heterogeneous enhancement. Mild diffuse groundglass densities are identified throughout the lungs;  mildly enlarged precarinal lymph node measuring 1.4 cm in short axis.  PET 04/07/2015  >> 1.  Hypermetabolic right lower lobe mass max SUV 13.2 measuring 6.2 x 7.9 cm (image128). The mass previously measured 6.1 x 7.7 cm 2.  12 mm hypermetabolic left adrenal nodule (image 155). 3.  No other worrisome hypermetabolic activity is identified. 4.  No hypermetabolic 4 mm subpleural right upper lobe nodule (image 24) 2 small            accurately characterize by PET.  09/26/2017 CT chest without contrast >>  1. Large RIGHT lower lobe mass with central and peripheral calcifications occupies the near entirety of the RIGHT lower lobe and concerning for neoplasm. 2. Moderate RIGHT effusion. 3. Mild mediastinal and RIGHT hilar adenopathy. 4. Recommend FDG PET scan for biopsy planning/staging  7/17 CT abd and pelvis >>  1. Large complex masses in the right lower lobe of the lung and in the right hepatic lobe. Smaller liver  lesions in the left hepatic lobe. Nonspecific lesions in the spleen and kidneys. No significant pathologic adenopathy. This would not be a characteristic metastatic pattern for prostate cancer and is not a specific metastatic and imaging pattern to suggest an individual etiology. Possibilities might include metastatic lung cancer, melanoma, metastatic hepatocellular carcinoma, or a variety of other possibilities. Tissue diagnosis recommended. 2. Moderate prostatomegaly. 3. Scattered mild ascites. 4. Moderate cardiomegaly. Small inferior pericardial effusion. 5. Trace bilateral pleural effusions  CULTURES: Sputum culture: ordered   ANTIBIOTICS: None   SIGNIFICANT EVENTS: 7/16 Admit to cardiology  7/23: Afib with RVR >> Flash pulm edema >> Respiratory failure requiring intubation  LINES/TUBES: PIV's Foley catheter 7/23>> ETT 7/23>> OG tube 7/23>>  DISCUSSION: 66yoM with hx CHF (EF 25-30%), HTN, Afib (on ASA as outpatient, had hemoptysis when on Xarelto), OSA on CPAP, Prostate cancer, and hypermetabolic RLL mass with left adrenal nodule (diagnosed on PET scan 03/2015), admitted with CHF exacerbation and Afib with RVR. Developed Afib with RVR and Flash pulmonary edema on 7/23 PM requiring intubation.   ASSESSMENT / PLAN:  PULMONARY 1. Acute Hypoxic and Hypercapneic Respiratory failure; Flash pulmonary edema - reviewed ABG showing severe acute-on-chronic hypercapnea (7.10/103/117/30); increased RR from 20 to 24 on vent. Repeat ABG in 1 hour - received '40mg'$  Lasix IV during intubation; will start lasix infusion - CXR on my review shows diffuse bilateral pulmonary edema 2. RLL Mass: hypermetabolic on PET scan, likely malignant in nature. Biopsy of liver met pending.  3. Right pleural effusion: s/p thoracentesis 7/17 with cytology showing reactive mesothelial cells 7/24 The patient is presently intubated and sedated. He is on full vent support. He is on 40% FI02 and PEEP 5 No plan on  weaning patient today. Have not seen result onpleural fluid cytology yet.  7/25 The patient is doing relatively well on a wean.   He has decent TVs on PS of 10. TVs are well into the 400 range. Minute ventialtion <10 lpm. I am hopeful thepatient can be weaned today.  7/28 Respiratory status good. o2 sat in the high 90s on nasal cannula. Hemoptysis is a little less off of the heaprin   CARDIOVASCULAR 1. Afib with RVR; Acute systolic CHF exacerbation: - unsuccessful cardioversion on 7/19, returning to Afib following procedure.   - continue amiodarone infusion - Echo now shows EF 10-15% from baseline of  25%, likely worsening EF due to the Afib RVR.  He diuresed about 1245 yesterday but was stilll 1378 ahead. He remains on the lasix drip at 10 mg /hr. Difficult to press for more aggressive diuresis in the face of his hypotension. His central venous 02 sat was 60% so the muilrinoine was added. 7/26 Milrinone was stopped yesterday. Amiodarone has been cut in 1/2 to '1mg'$ /min. The patient remains on Norepi 5 mcg/minpresently. He is off the lasix drip at this time. He did have a net diuresis of 1954 in the last 24 hours. mCentral venous sat about 62% which remains a bit low. 7/27 I feel compellled to turn off heparin with his persistent hemoptysis and now hematuria. He is on Amiodarone at 30 mg/hr. His heart rate still jumps into the 1110-120 range at times 7/28 The patient appears to be doijng well. Hopefully can wean the levo off soon. May be worthwhile tryiu ng a dose of midorine in that regard  .  Heparin is off Last mcentral venous was better at 68%  Hypokalemia Potassium is being replaced. I increaed spironolactone dose as well.  RENAL 1. AKI: -The patient had a crewatinine fo close to 1 3-4 days ago. Unfortunately, in an effort to mobilize fluid we have had to push the diuretics and his creatinie has climbed to 1.89 presently. 7/27 Creatinine is 1.62.  His output was 2160 in  the past 24 hours. He remains on lasix 60 mg bid. 7/28 newest creat is 1.49. Urine output 2310 in thelast 24 hours with net 864 oiut.   GASTROINTESTINAL 1. Anemia: - his Hb has trickled down to 9.7. The patient  Does have occasibnal hemoptysis which has been longstanding. No evidnece of gross gi bleed Hb is stable 7/28 Hb is stable  HEMATOLOGIC/Oncologic 1. 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 Initial concern was for a malignant pleural effusion as well, although cytology was negative. Liver biopsy on 7/22 is still pending. Although he has a history of prostate cancer, it would be unusual for it to metastasize to the lung in this fashion.  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 His wife is awre of these results.  Once he is more stable will need to discuss possible therapy with oncology.  INFECTIOUS The grew out a few colonies of H flu.was placed on Rocephin. Unsure if it represents true pneumonia  ENDOCRINE 1. Hyperglycemia: no hx of DM - His Blood sugars are elevated. I can tighten the coverage and start some Lantus. 7/27 Change  gluometers to ac and HS  NEUROLOGIC Patient is awake and following commands  FAMILY  - Updated patient's wife at bedside  - Inter-disciplinary family meet or Palliative Care meeting due by: 10/09/17     In the long run the patient is not likely to do well given his severely depressed LV fn. And multipl, other issues. As DR. Bensimone has mentinoned thepatient is not a candiadte for advanced cardia therapies ( LVAD, transplant etc.,)  Micheal Likens MD Pulmonary and [place] Pager: [phone]  10/08/2017, 8:15 AM

## 2017-10-08 NOTE — Plan of Care (Signed)
  Problem: Education: Goal: Knowledge of General Education information will improve Outcome: Progressing   Problem: Education: Goal: Knowledge of General Education information will improve Outcome: Progressing   Problem: Health Behavior/Discharge Planning: Goal: Ability to manage health-related needs will improve Outcome: Progressing   Problem: Clinical Measurements: Goal: Ability to maintain clinical measurements within normal limits will improve Outcome: Progressing Goal: Will remain free from infection Outcome: Progressing Goal: Diagnostic test results will improve Outcome: Progressing Goal: Respiratory complications will improve Outcome: Progressing Goal: Cardiovascular complication will be avoided Outcome: Progressing   Problem: Coping: Goal: Level of anxiety will decrease Outcome: Progressing   Problem: Safety: Goal: Ability to remain free from injury will improve Outcome: Progressing   Problem: Skin Integrity: Goal: Risk for impaired skin integrity will decrease Outcome: Progressing   Problem: Education: Goal: Knowledge of disease or condition will improve Outcome: Progressing Goal: Understanding of medication regimen will improve Outcome: Progressing   Problem: Activity: Goal: Ability to tolerate increased activity will improve Outcome: Progressing   Problem: Cardiac: Goal: Ability to achieve and maintain adequate cardiopulmonary perfusion will improve Outcome: Progressing   Problem: Health Behavior/Discharge Planning: Goal: Ability to safely manage health-related needs after discharge will improve Outcome: Progressing

## 2017-10-09 DIAGNOSIS — J9601 Acute respiratory failure with hypoxia: Secondary | ICD-10-CM

## 2017-10-09 DIAGNOSIS — J9600 Acute respiratory failure, unspecified whether with hypoxia or hypercapnia: Secondary | ICD-10-CM

## 2017-10-09 LAB — BASIC METABOLIC PANEL
ANION GAP: 11 (ref 5–15)
BUN: 21 mg/dL (ref 8–23)
CALCIUM: 8.3 mg/dL — AB (ref 8.9–10.3)
CHLORIDE: 98 mmol/L (ref 98–111)
CO2: 29 mmol/L (ref 22–32)
Creatinine, Ser: 1.49 mg/dL — ABNORMAL HIGH (ref 0.61–1.24)
GFR calc non Af Amer: 47 mL/min — ABNORMAL LOW (ref >60)
GFR, EST AFRICAN AMERICAN: 54 mL/min — AB (ref >60)
GLUCOSE: 110 mg/dL — AB (ref 70–99)
POTASSIUM: 3.5 mmol/L (ref 3.5–5.1)
Sodium: 138 mmol/L (ref 135–145)

## 2017-10-09 LAB — CBC
HCT: 29.1 % — ABNORMAL LOW (ref 39.0–52.0)
HEMOGLOBIN: 9.5 g/dL — AB (ref 13.0–17.0)
MCH: 29.4 pg (ref 26.0–34.0)
MCHC: 32.6 g/dL (ref 30.0–36.0)
MCV: 90.1 fL (ref 78.0–100.0)
Platelets: 253 10*3/uL (ref 150–400)
RBC: 3.23 MIL/uL — AB (ref 4.22–5.81)
RDW: 14 % (ref 11.5–15.5)
WBC: 6.7 10*3/uL (ref 4.0–10.5)

## 2017-10-09 LAB — GLUCOSE, CAPILLARY
GLUCOSE-CAPILLARY: 118 mg/dL — AB (ref 70–99)
GLUCOSE-CAPILLARY: 101 mg/dL — AB (ref 70–99)
Glucose-Capillary: 88 mg/dL (ref 70–99)
Glucose-Capillary: 100 mg/dL — ABNORMAL HIGH (ref 70–99)
Glucose-Capillary: 96 mg/dL (ref 70–99)

## 2017-10-09 LAB — COOXEMETRY PANEL
CARBOXYHEMOGLOBIN: 1.5 % (ref 0.5–1.5)
METHEMOGLOBIN: 1.2 % (ref 0.0–1.5)
O2 SAT: 49.2 %
TOTAL HEMOGLOBIN: 10.1 g/dL — AB (ref 12.0–16.0)

## 2017-10-09 MED ORDER — POTASSIUM CHLORIDE CRYS ER 20 MEQ PO TBCR
40.0000 meq | EXTENDED_RELEASE_TABLET | Freq: Every day | ORAL | Status: DC
  Administered 2017-10-09 – 2017-10-12 (×4): 40 meq via ORAL
  Filled 2017-10-09 (×5): qty 2

## 2017-10-09 MED ORDER — DIGOXIN 125 MCG PO TABS
0.1250 mg | ORAL_TABLET | Freq: Every day | ORAL | Status: DC
  Administered 2017-10-09 – 2017-10-12 (×4): 0.125 mg via ORAL
  Filled 2017-10-09 (×4): qty 1

## 2017-10-09 MED ORDER — AMIODARONE HCL 200 MG PO TABS
400.0000 mg | ORAL_TABLET | Freq: Two times a day (BID) | ORAL | Status: DC
  Administered 2017-10-09 – 2017-10-12 (×7): 400 mg via ORAL
  Filled 2017-10-09 (×7): qty 2

## 2017-10-09 NOTE — Care Management Note (Signed)
Case Management Note Previous Note Created by Marvetta Gibbons RN, BSN Unit 4E-Case Manager [phone]  Patient Details  Name: Logan [name] MRN: [id number] Date of Birth: 07-22-1950  Subjective/Objective:   Pt admitted with afib s/p cardiov. Unsuccessful. Acute on chronic HF. Liver biopsy done on 7.22 for left adrenal nodule hx prostate cancer with RLL mass.                  Action/Plan: PTA pt lived at home with spouse, CM following for transition of care needs.   Expected Discharge Date:                  Expected Discharge Plan:     In-House Referral:     Discharge planning Services  CM Consult  Post Acute Care Choice:    Choice offered to:     DME Arranged:    DME Agency:     HH Arranged:    HH Agency:     Status of Service:  In process, will continue to follow  If discussed at Long Length of Stay Meetings, dates discussed:    Discharge Disposition:   Additional Comments: 10/09/2017 Pt remains in ICU.  Per bedside nurse pt is independently ambulating in room - neither PT nor OT eval warranted at this time. CM will continue to follow for discharge needs  10/04/17- 1100- Kristi Webster RN, CM- pt tx to ICU last night after becoming unresponsive and requiring intubation for flash pulm. Edema. Currently on Vent  Maryclare Labrador, RN 10/09/2017, 1:53 PM

## 2017-10-09 NOTE — Progress Notes (Signed)
PULMONARY / CRITICAL CARE MEDICINE   Name: Logan [name] MRN: [id number] DOB: Oct 18, 1950    ADMISSION DATE:  09/26/2017  HISTORY OF PRESENT ILLNESS:   89yoM with hx CHF (EF 25-30%), HTN, Afib (on ASA as outpatient, had hemoptysis when on Xarelto), OSA on CPAP, Prostate cancer, and hypermetabolic RLL mass with left adrenal nodule (diagnosed on PET scan 03/2015), admitted with CHF exacerbation and Afib with RVR. Since then he has undergone thoracentesis 7/17 and liver biopsy 7/22. Patient has been hypotensive which has limited ability to diurese him. Amiodarone infusion was stopped as there was concern it was causing hypotension. Then this evening patient went into Afib with RVR and Flash pulmonary edema, ultimately requiring intubation. He was transferred to ICU. Heart failure team had been following and started milrinone and levophed infusions, as well as a lasix infusion. By 7/26 he had improved and was able to come off milrinone and the vent. Course now complicated by hemoptysis and hematuria.   SUBJECTIVE:  Doing much better today. Off levophed. Ambulated to the bathroom with no difficulties and O2 sat in high 90s.    VITAL SIGNS: BP (!) 103/47 (BP Location: Left Arm)   Pulse 93   Temp 97.9 F (36.6 C) (Oral)   Resp (!) 31   Ht 5\' 11"  (1.803 m)   Wt 98.6 kg (217 lb 6 oz)   SpO2 94%   BMI 30.32 kg/m    INTAKE / OUTPUT: I/O last 3 completed shifts: In: 1063.2 [I.V.:863.2; IV Piggyback:200] Out: 1310 [Urine:1310]  PHYSICAL EXAMINATION:  General: Adult male in NAD Neuro: alert, oriented, non-focal HEENT: [place]/AT, PERRL, no appreciable JVD, but seated at 90 degrees.  Cardiovascular: IRIR, mildly tachy.  Lungs: Rhoncherous R base. Ambulating on room air with sats in high 90s.  Abdomen: Soft, non-tender, nondistended.  Musculoskeletal: Trace edema Skin: no rashes   LABS:  BMET Recent Labs  Lab 10/07/17 0317 10/08/17 0312 10/09/17 0411  NA 135 135 138  K 3.7 3.2* 3.5   CL 96* 94* 98  CO2 30 31 29   BUN 19 19 21   CREATININE 1.62* 1.49* 1.49*  GLUCOSE 111* 134* 110*   Electrolytes Recent Labs  Lab 10/03/17 2347 10/04/17 0512  10/06/17 0728  10/07/17 0317 10/08/17 0312 10/09/17 0411  CALCIUM 8.8* 8.5*   < >  --    < > 8.7* 8.6* 8.3*  MG 2.2 2.0  --  1.8  --   --  2.2  --   PHOS 6.8* 4.3  --   --   --   --   --   --    < > = values in this interval not displayed.   CBC Recent Labs  Lab 10/07/17 0313 10/08/17 0312 10/09/17 0411  WBC 10.1 8.0 6.7  HGB 10.1* 9.8* 9.5*  HCT 30.9* 29.6* 29.1*  PLT 236 247 253   Coag's Recent Labs  Lab 10/06/17 0326  INR 1.53   Sepsis Markers Recent Labs  Lab 10/03/17 2347 10/04/17 0211  LATICACIDVEN 3.9* 3.1*  PROCALCITON <0.10  --     ABG Recent Labs  Lab 10/03/17 2312 10/04/17 0032 10/04/17 0435  PHART 7.103* 7.364 7.422  PCO2ART 103* 52.1* 46.7  PO2ART 117* 117* 111*    Liver Enzymes Recent Labs  Lab 10/06/17 0326  AST 27  ALT 24  ALKPHOS 61  BILITOT 1.0  ALBUMIN 2.2*    Cardiac Enzymes Recent Labs  Lab 10/03/17 2347 10/04/17 0512 10/04/17 1046  TROPONINI 0.04* 0.04*  0.04*    Glucose Recent Labs  Lab 10/07/17 2250 10/08/17 0658 10/08/17 1125 10/08/17 1619 10/08/17 2132 10/09/17 0656  GLUCAP 84 101* 110* 135* 118* 88    Imaging No results found.  STUDIES:  CTA PE 03/2014 >>  neg for PE; A large right infrahilar soft tissue mass is identified measuring 7.7 x 6.1 cm in transaxial dimensions by 5.8 cm in craniocaudal height. This demonstrates heterogeneous enhancement. Mild diffuse groundglass densities are identified throughout the lungs;  mildly enlarged precarinal lymph node measuring 1.4 cm in short axis.  PET 0/94/7096  >> Hypermetabolic right lower lobe mass max SUV 13.2 measuring 6.2 x 7.9 cm(image128). The mass previously measured 6.1 x 7.7 cm.  12 mm hypermetabolic left adrenal nodule (image 155). No other worrisome hypermetabolic activity is identified.  No hypermetabolic 4 mm subpleural right upper lobe nodule (image 24) 2 small  accurately characterize by PET.  09/26/2017 CT chest without contrast >> Large RIGHT lower lobe mass with central and peripheral calcifications occupies the near entirety of the RIGHT lower lobe and concerning for neoplasm. moderate RIGHT effusion. Mild mediastinal and RIGHT hilar adenopathy. Recommend FDG PET scan for biopsy planning/staging  7/17 CT abd and pelvis >> Large complex masses in the right lower lobe of the lung and in the right hepatic lobe. Smaller liver lesions in the left hepatic lobe. Nonspecific lesions in the spleen and kidneys. No significant pathologic adenopathy. This would not be a characteristic metastatic pattern for prostate cancer and is not a specific metastatic and imaging pattern to suggest an individual etiology. Possibilities might include metastatic lung cancer, melanoma, metastatic hepatocellular carcinoma, or a variety of other possibilities. Tissue diagnosis recommended. Moderate prostatomegaly. Scattered mild ascites. Moderate cardiomegaly. Small inferior pericardial effusion. Trace bilateral pleural effusions  CULTURES: Sputum culture: 7/23 Few H. Flu Beta lactamase negative.  ANTIBIOTICS: Rocephin 7/25 >  SIGNIFICANT EVENTS: 7/16 Admit to cardiology  7/23: Afib with RVR >> Flash pulm edema >> Respiratory failure requiring intubation  LINES/TUBES: PIV's Foley catheter 7/23>7/28 ETT 7/23>7/28 OG tube 7/23>7/28  DISCUSSION: 66yoM with hx CHF (EF 25-30%), HTN, Afib (on ASA as outpatient, had hemoptysis when on Xarelto), OSA on CPAP, Prostate cancer, and hypermetabolic RLL mass with left adrenal nodule (diagnosed on PET scan 03/2015), admitted with CHF exacerbation and Afib with RVR. Developed Afib with RVR and Flash pulmonary edema on 7/23 PM requiring intubation. He was treated with pressors including midodrine and a lasix infusion. He diuresed and improved to the point he could  be extubated 7/28. HE was then able to come off pressors.   ASSESSMENT / PLAN:   Acute Hypoxic and Hypercapneic Respiratory failure: secondary to flash pulmonary edema; Resolved. Extubated 7/28. 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 Associated hemoptysis Right pleural effusion: s/p thoracentesis 7/17 with cytology showing reactive mesothelial cells - Repeat CXR to evaluate effusion after volume removal.  - Will need outpatient oncology follow up. Molecular testing of biopsy pending, which generally takes a few weeks.  - MRI brain to complete staging prior to discharge if able. Not urgent per oncology.  - Unfortunately need to hold anticoagulation due to hemoptysis.  Afib with RVR; Acute systolic CHF exacerbation:Echo now shows EF 10-15% from baseline of 25%, likely worsening EF due to the Afib RVR. Being managed by advanced heart failure service. Who are currently diuresing with lasix and spironolactone, giving amiodarone and digoxin.  Hypokalemia - 3.5 today. Will replete  AKI: creatinine stable at 1.49 today. Negative 8.7L for amission -  diuresis per AHF service.    Anemia: Hb has trickled down to 9.5. Hemoptysis improving. - Supportive care, follow CBC  INFECTIOUS The grew out a few colonies of H flu.was placed on Rocephin. Unsure if it represents true pneumonia  Hyperglycemia: no hx of DM - Continue lantus and SSI   FAMILY  - Updated patient 7/29  Patient doing much better today, but CHF combined with Stage IV adenocarcinoma carries poor prognosis. Transfer to telemetry and hospitalists for 7/30.  Georgann Housekeeper, AGACNP-BC [facility] Pulmonology/Critical Care Pager [phone] or [phone]  10/09/2017 12:39 PM

## 2017-10-09 NOTE — Progress Notes (Signed)
Patient ID: Logan [name], male   DOB: 1950/05/26, 67 y.o.   MRN: [id number]     Advanced Heart Failure Rounding Note  PCP-Cardiologist: Sinclair Grooms, MD   Subjective:    Extubated 7/26  He is now off norepinephrine.  HR in 100s at rest, remains in atrial fibrillation. Off heparin due to hemoptysis and hematuria which have resolved off heparin.   CVP 8 this morning.    Objective:   Weight Range: 217 lb 6 oz (98.6 kg) Body mass index is 30.32 kg/m.   Vital Signs:   Temp:  [97.7 F (36.5 C)-98.4 F (36.9 C)] 98.4 F (36.9 C) (07/29 0400) Pulse Rate:  [81-168] 89 (07/29 0630) Resp:  [17-39] 18 (07/29 0630) BP: (87-129)/(50-107) 95/74 (07/29 0630) SpO2:  [93 %-100 %] 95 % (07/29 0630) Weight:  [217 lb 6 oz (98.6 kg)] 217 lb 6 oz (98.6 kg) (07/29 0410) Last BM Date: 10/09/17  Weight change: Filed Weights   10/07/17 0615 10/08/17 0520 10/09/17 0410  Weight: 221 lb 12.5 oz (100.6 kg) 219 lb 2.2 oz (99.4 kg) 217 lb 6 oz (98.6 kg)    Intake/Output:   Intake/Output Summary (Last 24 hours) at 10/09/2017 0747 Last data filed at 10/09/2017 0600 Gross per 24 hour  Intake 598.06 ml  Output 950 ml  Net -351.94 ml      Physical Exam   General: NAD Neck: No JVD, no thyromegaly or thyroid nodule.  Lungs: Decreased BS at bases.  CV: Lateral PMI.  Heart mildly tachy, irregular S1/S2, no S3/S4, no murmur.  No peripheral edema.   Abdomen: Soft, nontender, no hepatosplenomegaly, no distention.  Skin: Intact without lesions or rashes.  Neurologic: Alert and oriented x 3.  Psych: Normal affect. Extremities: No clubbing or cyanosis.  HEENT: Normal.    Telemetry   Atrial fibrillation in 100s (personally reviewed)   Labs    CBC Recent Labs    10/08/17 0312 10/09/17 0411  WBC 8.0 6.7  HGB 9.8* 9.5*  HCT 29.6* 29.1*  MCV 89.2 90.1  PLT 247 409   Basic Metabolic Panel Recent Labs    10/08/17 0312 10/09/17 0411  NA 135 138  K 3.2* 3.5  CL 94* 98  CO2  31 29  GLUCOSE 134* 110*  BUN 19 21  CREATININE 1.49* 1.49*  CALCIUM 8.6* 8.3*  MG 2.2  --    Liver Function Tests No results for input(s): AST, ALT, ALKPHOS, BILITOT, PROT, ALBUMIN in the last 72 hours. No results for input(s): LIPASE, AMYLASE in the last 72 hours. Cardiac Enzymes No results for input(s): CKTOTAL, CKMB, CKMBINDEX, TROPONINI in the last 72 hours.  BNP: BNP (last 3 results) Recent Labs    09/26/17 0955 10/03/17 2347  BNP 537.4* 362.5*    ProBNP (last 3 results) No results for input(s): PROBNP in the last 8760 hours.   D-Dimer No results for input(s): DDIMER in the last 72 hours. Hemoglobin A1C Recent Labs    10/07/17 1606  HGBA1C 6.9*   Fasting Lipid Panel No results for input(s): CHOL, HDL, LDLCALC, TRIG, CHOLHDL, LDLDIRECT in the last 72 hours. Thyroid Function Tests No results for input(s): TSH, T4TOTAL, T3FREE, THYROIDAB in the last 72 hours.  Invalid input(s): FREET3  Other results:   Imaging    No results found.   Medications:     Scheduled Medications: . amiodarone  400 mg Oral BID  . Chlorhexidine Gluconate Cloth  6 each Topical Daily  . digoxin  0.125  mg Oral Daily  . furosemide  80 mg Oral BID  . insulin aspart  0-15 Units Subcutaneous TID WC  . insulin glargine  10 Units Subcutaneous Daily  . pantoprazole  40 mg Oral Daily  . potassium chloride  40 mEq Oral Daily  . sodium chloride flush  10-40 mL Intracatheter Q12H  . sodium chloride flush  3 mL Intravenous Q12H  . spironolactone  25 mg Oral BID    Infusions: . sodium chloride    . sodium chloride Stopped (10/08/17 2249)  . cefTRIAXone (ROCEPHIN)  IV Stopped (10/08/17 2213)  . fentaNYL infusion INTRAVENOUS Stopped (10/06/17 0804)  . norepinephrine (LEVOPHED) Adult infusion Stopped (10/08/17 1420)  . phenylephrine (NEO-SYNEPHRINE) Adult infusion Stopped (10/04/17 1046)    PRN Medications: sodium chloride, sodium chloride, acetaminophen (TYLENOL) oral liquid 160  mg/5 mL **OR** acetaminophen, docusate, ondansetron (ZOFRAN) IV, sodium chloride flush, sodium chloride flush    Patient Profile    Logan [name] is 68 year old with history of HTN, A Fib, OSA, untreated prostate cancer 2012 , RLL mass 2015 declined treatment, hemoptysis for the last 6 months (he stopped xarelto), and chronic systolic heart failure.   Admitted with A fib RVR and A/C systolic heart failure.   Assessment/Plan   1. Acute on chronic systolic CHF: TEE 6/22 with EF 15%. Long history of nonischemic cardiomyopathy. No ICD. He was admitted with afib/RVR and dyspnea. Failed TEE-guided DCCV and intubated likely due to pulmonary edema. He is now off norepinephrine.  CVP down to 8 on po Lasix.  He remains in atrial fibrillation. -Awaiting co-ox today.  - Continue Lasix 80 mg po bid.  - He is now off norepinephrine.  Will add digoxin today.  - Continue spironolactone.  -Long-term prognosis with combination of metastatic lung cancer and severe CHF(low output) is very poor. He is not a candidate for advanced cardiac therapies.  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 Seen by oncology, not candidate for chemotherapy but may be candidate for immunotherapy depending on genomics.  3. Hemoptysis:Has had to come off heparin gtt with hemoptysis.  This has now resolved off anticoagulation.  Likely from lung mass.  It appears that we will not be able to anticoagulate him.  4. Atrial fibrillation: Initially with RVR, likely was driving CHF. He failed DCCV but then with additionofamiodaronehad gone back to NSR on 7/24, but on 7/25 he was back in afib.  Today, in afib with rate in 100s at rest.   -Transition to po amiodarone 400 mg bid today for rate control.  Would like to eventually transition from amiodarone to Toprol XL as I do not think that we can keep him on anticoagulation for DCCV due to hemoptysis, at least at this time.   - Added digoxin for rate  control - As above, unable to anticoagulate at this time with hemoptysis.  5. WLN:[id number] 1.49, stable today.  6. H/o elevated PSA: Declined workupin past.  7. Acute hypoxemic respiratory failure: Suspect due to pulmonary edema + large RLL mass/hemoptysis. No evidence for active infection. Now extubated.   Length of Stay: 73  Loralie Champagne, MD  10/09/2017, 7:47 AM  Advanced Heart Failure Team Pager [phone] (M-F; 7a - 4p)  Please contact [facility] for night-coverage after hours (4p -7a ) and weekends on [email or web address]

## 2017-10-10 ENCOUNTER — Inpatient Hospital Stay (HOSPITAL_COMMUNITY): Payer: 59

## 2017-10-10 DIAGNOSIS — J90 Pleural effusion, not elsewhere classified: Secondary | ICD-10-CM

## 2017-10-10 LAB — CBC
HEMATOCRIT: 30 % — AB (ref 39.0–52.0)
HEMOGLOBIN: 9.7 g/dL — AB (ref 13.0–17.0)
MCH: 29.4 pg (ref 26.0–34.0)
MCHC: 32.3 g/dL (ref 30.0–36.0)
MCV: 90.9 fL (ref 78.0–100.0)
Platelets: 286 10*3/uL (ref 150–400)
RBC: 3.3 MIL/uL — ABNORMAL LOW (ref 4.22–5.81)
RDW: 14.3 % (ref 11.5–15.5)
WBC: 7.5 10*3/uL (ref 4.0–10.5)

## 2017-10-10 LAB — GLUCOSE, CAPILLARY
GLUCOSE-CAPILLARY: 83 mg/dL (ref 70–99)
Glucose-Capillary: 185 mg/dL — ABNORMAL HIGH (ref 70–99)
Glucose-Capillary: 120 mg/dL — ABNORMAL HIGH (ref 70–99)
Glucose-Capillary: 96 mg/dL (ref 70–99)

## 2017-10-10 LAB — COOXEMETRY PANEL
Carboxyhemoglobin: 1.7 % — ABNORMAL HIGH (ref 0.5–1.5)
Methemoglobin: 1.1 % (ref 0.0–1.5)
O2 Saturation: 60.3 %
TOTAL HEMOGLOBIN: 10 g/dL — AB (ref 12.0–16.0)

## 2017-10-10 LAB — BASIC METABOLIC PANEL
Anion gap: 10 (ref 5–15)
BUN: 17 mg/dL (ref 8–23)
CHLORIDE: 100 mmol/L (ref 98–111)
CO2: 27 mmol/L (ref 22–32)
Calcium: 8.7 mg/dL — ABNORMAL LOW (ref 8.9–10.3)
Creatinine, Ser: 1.41 mg/dL — ABNORMAL HIGH (ref 0.61–1.24)
GFR calc non Af Amer: 50 mL/min — ABNORMAL LOW (ref >60)
GFR, EST AFRICAN AMERICAN: 58 mL/min — AB (ref >60)
Glucose, Bld: 86 mg/dL (ref 70–99)
POTASSIUM: 3.8 mmol/L (ref 3.5–5.1)
SODIUM: 137 mmol/L (ref 135–145)

## 2017-10-10 MED ORDER — SPIRONOLACTONE 25 MG PO TABS
25.0000 mg | ORAL_TABLET | Freq: Every day | ORAL | Status: DC
  Administered 2017-10-10 – 2017-10-12 (×3): 25 mg via ORAL
  Filled 2017-10-10 (×3): qty 1

## 2017-10-10 MED ORDER — TRAZODONE HCL 50 MG PO TABS
50.0000 mg | ORAL_TABLET | Freq: Every evening | ORAL | Status: DC | PRN
  Administered 2017-10-10: 50 mg via ORAL
  Filled 2017-10-10: qty 1

## 2017-10-10 MED ORDER — ACETAMINOPHEN 160 MG/5ML PO SOLN: 650.0000 mg | Freq: Four times a day (QID) | ORAL | Status: DC | PRN

## 2017-10-10 MED ORDER — INSULIN ASPART 100 UNIT/ML ~~LOC~~ SOLN: 0.0000 [IU] | Freq: Every day | SUBCUTANEOUS | Status: DC

## 2017-10-10 MED ORDER — ACETAMINOPHEN 325 MG PO TABS: 650.0000 mg | ORAL_TABLET | Freq: Four times a day (QID) | ORAL | Status: DC | PRN

## 2017-10-10 MED ORDER — LOSARTAN POTASSIUM 25 MG PO TABS
25.0000 mg | ORAL_TABLET | Freq: Every evening | ORAL | Status: DC
  Administered 2017-10-10 – 2017-10-11 (×2): 25 mg via ORAL
  Filled 2017-10-10 (×2): qty 1

## 2017-10-10 MED ORDER — METOPROLOL TARTRATE 5 MG/5ML IV SOLN
10.0000 mg | Freq: Once | INTRAVENOUS | Status: AC
  Administered 2017-10-10: 10 mg via INTRAVENOUS
  Filled 2017-10-10: qty 10

## 2017-10-10 MED ORDER — INSULIN ASPART 100 UNIT/ML ~~LOC~~ SOLN: 0.0000 [IU] | Freq: Three times a day (TID) | SUBCUTANEOUS | Status: DC

## 2017-10-10 NOTE — Care Management Note (Addendum)
Case Management Note Previous Note Created by Marvetta Gibbons RN, BSN Unit 4E-Case Manager [phone]  Patient Details  Name: Logan [name] MRN: [id number] Date of Birth: 1950-09-19  Subjective/Objective:   Pt admitted with afib s/p cardiov. Unsuccessful. Acute on chronic HF. Liver biopsy done on 7.22 for left adrenal nodule hx prostate cancer with RLL mass.                  Action/Plan: PTA pt lived at home with spouse, CM following for transition of care needs.   Expected Discharge Date:                  Expected Discharge Plan:  Home/Self Care  In-House Referral:     Discharge planning Services  CM Consult  Post Acute Care Choice:    Choice offered to:     DME Arranged:    DME Agency:     HH Arranged:    HH Agency:     Status of Service:  In process, will continue to follow  If discussed at Long Length of Stay Meetings, dates discussed:    Discharge Disposition:   Additional Comments: 10/10/2017  Pt now with confirmed metastic CA with limited treatment options - CM requested palliative consult when appropriate. Pt now transferred to progressive unit.  CM discussed discharge planning with wife.  Wife plans to take pt home with her - pt doesn't need any equipment in the home and PT has signed off.  Pt has PCP and wife denied barriers with paying for medications.  CM communicated the need of daily weights and low salt diet at discharge  10/09/17 Pt remains in ICU.  Per bedside nurse pt is independently ambulating in room - neither PT nor OT eval warranted at this time. CM will continue to follow for discharge needs  10/04/17- 1100- Kristi Webster RN, CM- pt tx to ICU last night after becoming unresponsive and requiring intubation for flash pulm. Edema. Currently on Vent  Maryclare Labrador, RN 10/10/2017, 2:33 PM

## 2017-10-10 NOTE — Progress Notes (Addendum)
Patient ID: Logan [name], male   DOB: 1950-10-12, 67 y.o.   MRN: [id number]     Advanced Heart Failure Rounding Note  PCP-Cardiologist: Sinclair Grooms, MD   Subjective:    Extubated 7/26  Off NE and moved to Stepdown 10/09/17  Feeling Ok. Didn't sleep very well with change of floors. Denies SOB or lightheadedness.   CVP pressure line not connected to PICC line this am.    Objective:   Weight Range: 217 lb 13 oz (98.8 kg) Body mass index is 30.38 kg/m.   Vital Signs:   Temp:  [97.4 F (36.3 C)-98.1 F (36.7 C)] 97.8 F (36.6 C) (07/30 0425) Pulse Rate:  [82-129] 86 (07/30 0500) Resp:  [16-31] 23 (07/30 0500) BP: (92-145)/(47-129) 105/78 (07/30 0500) SpO2:  [92 %-97 %] 96 % (07/30 0500) Weight:  [217 lb 13 oz (98.8 kg)] 217 lb 13 oz (98.8 kg) (07/30 0500) Last BM Date: 10/09/17  Weight change: Filed Weights   10/08/17 0520 10/09/17 0410 10/10/17 0500  Weight: 219 lb 2.2 oz (99.4 kg) 217 lb 6 oz (98.6 kg) 217 lb 13 oz (98.8 kg)    Intake/Output:   Intake/Output Summary (Last 24 hours) at 10/10/2017 0730 Last data filed at 10/10/2017 0500 Gross per 24 hour  Intake 698.2 ml  Output 450 ml  Net 248.2 ml      Physical Exam   General: NAD HEENT: Normal Neck: Supple. JVP 5-6. Carotids 2+ bilat; no bruits. No thyromegaly or nodule noted. Cor: PMI lateral. Mildly tachy, Regular this am.  Lungs: CTAB, normal effort. Abdomen: Soft, non-tender, non-distended, no HSM. No bruits or masses. +BS  Extremities: No cyanosis, clubbing, or rash. R and LLE no edema.  Neuro: Alert & orientedx3, cranial nerves grossly intact. moves all 4 extremities w/o difficulty. Affect pleasant   Telemetry   NSR 80-90s this am, but appears to be in and out of Afib with short 20-30 minutes stints.   Labs    CBC Recent Labs    10/09/17 0411 10/10/17 0444  WBC 6.7 7.5  HGB 9.5* 9.7*  HCT 29.1* 30.0*  MCV 90.1 90.9  PLT 253 620   Basic Metabolic Panel Recent Labs   10/08/17 0312 10/09/17 0411 10/10/17 0444  NA 135 138 137  K 3.2* 3.5 3.8  CL 94* 98 100  CO2 31 29 27   GLUCOSE 134* 110* 86  BUN 19 21 17   CREATININE 1.49* 1.49* 1.41*  CALCIUM 8.6* 8.3* 8.7*  MG 2.2  --   --    Liver Function Tests No results for input(s): AST, ALT, ALKPHOS, BILITOT, PROT, ALBUMIN in the last 72 hours. No results for input(s): LIPASE, AMYLASE in the last 72 hours. Cardiac Enzymes No results for input(s): CKTOTAL, CKMB, CKMBINDEX, TROPONINI in the last 72 hours.  BNP: BNP (last 3 results) Recent Labs    09/26/17 0955 10/03/17 2347  BNP 537.4* 362.5*    ProBNP (last 3 results) No results for input(s): PROBNP in the last 8760 hours.   D-Dimer No results for input(s): DDIMER in the last 72 hours. Hemoglobin A1C Recent Labs    10/07/17 1606  HGBA1C 6.9*   Fasting Lipid Panel No results for input(s): CHOL, HDL, LDLCALC, TRIG, CHOLHDL, LDLDIRECT in the last 72 hours. Thyroid Function Tests No results for input(s): TSH, T4TOTAL, T3FREE, THYROIDAB in the last 72 hours.  Invalid input(s): FREET3  Other results:   Imaging    No results found.   Medications:  Scheduled Medications: . amiodarone  400 mg Oral BID  . Chlorhexidine Gluconate Cloth  6 each Topical Daily  . digoxin  0.125 mg Oral Daily  . furosemide  80 mg Oral BID  . insulin aspart  0-15 Units Subcutaneous TID WC  . insulin glargine  10 Units Subcutaneous Daily  . pantoprazole  40 mg Oral Daily  . potassium chloride  40 mEq Oral Daily  . sodium chloride flush  10-40 mL Intracatheter Q12H  . sodium chloride flush  3 mL Intravenous Q12H  . spironolactone  25 mg Oral BID    Infusions: . sodium chloride    . sodium chloride Stopped (10/09/17 0956)  . cefTRIAXone (ROCEPHIN)  IV 1 g (10/09/17 2123)  . fentaNYL infusion INTRAVENOUS Stopped (10/06/17 0804)  . norepinephrine (LEVOPHED) Adult infusion Stopped (10/08/17 1420)  . phenylephrine (NEO-SYNEPHRINE) Adult infusion  Stopped (10/04/17 1046)    PRN Medications: sodium chloride, sodium chloride, acetaminophen (TYLENOL) oral liquid 160 mg/5 mL **OR** acetaminophen, docusate, ondansetron (ZOFRAN) IV, sodium chloride flush, sodium chloride flush    Patient Profile    [name] is 67 year old with history of HTN, A Fib, OSA, untreated prostate cancer 2012 , RLL mass 2015 declined treatment, hemoptysis for the last 6 months (he stopped xarelto), and chronic systolic heart failure.   Admitted with A fib RVR and A/C systolic heart failure.   Assessment/Plan   1. Acute on chronic systolic CHF: -  TEE 2/02 with EF 15%. Long history of nonischemic cardiomyopathy. No ICD. He was admitted with afib/RVR and dyspnea. Failed TEE-guided DCCV and intubated likely due to pulmonary edema. He is now off norepinephrine.  - He remains in atrial fibrillation. - Coox 60.3% this am.  - CVP not connected this am, but JVP does not appear elevated. Continue lasix 80 mg BID by mouth.  - Continue digoxin 0.125 mg daily.  - Decrease spironolactone to 25 mg daily and add losartan 25 mg qpm.  -Long-term prognosis with combination of metastatic lung cancer and severe CHF(low output) is very poor. He is not a candidate for advanced cardiac therapies.  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 Seen by oncology, not candidate for chemotherapy but may be candidate for immunotherapy depending on genomics.  - No change to current plan.   3. Hemoptysis: - Has had to come off heparin gtt with hemoptysis.  This has now resolved off anticoagulation.  Likely from lung mass.  It appears that we will not be able to anticoagulate him.  - No change to current plan.   4. Atrial fibrillation:  - Initially with RVR, likely was driving CHF. He failed DCCV but then with additionofamiodaronehad gone back to NSR on 7/24, but on 7/25 he was back in afib.  - He appears to be in NSR 80-90s this am, but intermittently in  Afib overnight with rates 90-100s - Continue po amiodarone 400 mg BID for now.  - Added digoxin for rate control - As above, unable to anticoagulate at this time with hemoptysis.  5. AKI: - Stable at 1.41 today 6. H/o elevated PSA:  - Declined workupin past.  7. Acute hypoxemic respiratory failure:  - Suspect due to pulmonary edema + large RLL mass/hemoptysis.  - H influenzae in sputum, treating with ceftriaxone x 7 days.   Progressing. Will discuss disposition with MD. Suspect home next 1-2 days. Will have PT see.   Length of Stay: [address], [place]  10/10/2017, 7:30 AM  Advanced Heart Failure Team Pager [phone] (M-F; [place])  Please contact [facility] for night-coverage after hours (4p -7a ) and weekends on [email or web address]  Patient seen with PA, agree with the above note.   He is back in NSR with PACs this morning.  Creatinine down to 1.41.  He is on po Lasix now.  Good co-ox.   On exam, JVP not elevated.  Regular S1S2, no S3.  Decreased BS at bases.  No edema.   Volume status looks ok. Continue po Lasix, digoxin, and spironolactone.  - Can add losartan 25 mg daily today.   He is in NSR, continue amiodarone po.  Not candidate for anticoagulation with lung mass and severe hemoptysis (now resolved).   He is on ceftriaxone for H influenzae in sputum.   Poor overall prognosis with CHF and metastatic lung cancer.  To followup with oncology as outpatient re: treatment options.   Suspect he will be ready for home perhaps as early as tomorrow with close followup.   Loralie Champagne 10/10/2017 9:35 AM

## 2017-10-10 NOTE — Evaluation (Signed)
Physical Therapy Evaluation/ Discharge Patient Details Name: Logan [name] MRN: [id number] DOB: [birth year] Today's Date: 10/10/2017   History of Present Illness  67 year old admitted with Afib with RVR and CHF. Failed TEE with DCCV on 7/19 intubated until 7/26. PMhx: HTN, A Fib, OSA, untreated prostate cancer 2012 , RLL mass 2015 declined treatment with mets, hemoptysis for the last 6 months (he stopped xarelto), and chronic heart failure  Clinical Impression  Pt very pleasant states he works full time with stairs to enter his office and his home. Pt able to perform basic transfers, gait and stairs without assist or LOB. Pt with HR 80-103 with activity, no Sob and stable throughout. Pt at baseline functional status with wife and family able to assist at home as needed. Pt encouraged to continue walking daily and maintain exercise at home. No further therapy needs with pt aware and agreeable.     Follow Up Recommendations No PT follow up    Equipment Recommendations  None recommended by PT    Recommendations for Other Services       Precautions / Restrictions Precautions Precautions: None      Mobility  Bed Mobility               General bed mobility comments: in chair on arrival  Transfers Overall transfer level: Independent                  Ambulation/Gait Ambulation/Gait assistance: Independent Gait Distance (Feet): 600 Feet Assistive device: None Gait Pattern/deviations: WFL(Within Functional Limits)   Gait velocity interpretation: >4.37 ft/sec, indicative of normal walking speed    Stairs Stairs: Yes Stairs assistance: Modified independent (Device/Increase time) Stair Management: One rail Left;Alternating pattern;Forwards Number of Stairs: 4    Wheelchair Mobility    Modified Rankin (Stroke Patients Only)       Balance Overall balance assessment: No apparent balance deficits (not formally assessed)                                            Pertinent Vitals/Pain Pain Assessment: No/denies pain    Home Living Family/patient expects to be discharged to:: Private residence Living Arrangements: Spouse/significant other Available Help at Discharge: Family Type of Home: House Home Access: Stairs to enter Entrance Stairs-Rails: Psychiatric nurse of Steps: 3 Home Layout: One level Home Equipment: None      Prior Function Level of Independence: Independent               Hand Dominance        Extremity/Trunk Assessment   Upper Extremity Assessment Upper Extremity Assessment: Overall WFL for tasks assessed    Lower Extremity Assessment Lower Extremity Assessment: Overall WFL for tasks assessed    Cervical / Trunk Assessment Cervical / Trunk Assessment: Normal  Communication   Communication: No difficulties  Cognition Arousal/Alertness: Awake/alert Behavior During Therapy: WFL for tasks assessed/performed Overall Cognitive Status: Within Functional Limits for tasks assessed                                        General Comments      Exercises     Assessment/Plan    PT Assessment Patent does not need any further PT services  PT Problem List  PT Treatment Interventions      PT Goals (Current goals can be found in the Care Plan section)  Acute Rehab PT Goals PT Goal Formulation: All assessment and education complete, DC therapy    Frequency     Barriers to discharge        Co-evaluation               AM-PAC PT "6 Clicks" Daily Activity  Outcome Measure Difficulty turning over in bed (including adjusting bedclothes, sheets and blankets)?: None Difficulty moving from lying on back to sitting on the side of the bed? : None Difficulty sitting down on and standing up from a chair with arms (e.g., wheelchair, bedside commode, etc,.)?: None Help needed moving to and from a bed to chair (including a wheelchair)?: None Help  needed walking in hospital room?: None Help needed climbing 3-5 steps with a railing? : None 6 Click Score: 24    End of Session   Activity Tolerance: Patient tolerated treatment well Patient left: in chair;with call bell/phone within reach Nurse Communication: Mobility status PT Visit Diagnosis: Other abnormalities of gait and mobility (R26.89)    Time: 5465-0354 PT Time Calculation (min) (ACUTE ONLY): 13 min   Charges:   PT Evaluation $PT Eval Low Complexity: 1 Low          [address] [place]   Les Longmore B Eduard Penkala 10/10/2017, 1:34 PM

## 2017-10-10 NOTE — Progress Notes (Addendum)
[place] TEAM 1 - Stepdown/ICU TEAM  Logan [name]  LDJ:[id number] DOB: 1950/05/20 DOA: 09/26/2017 PCP: [name], MD    Brief Narrative:  67yo M with hx CHF (EF 25-30%), HTN, Afib (on ASA as outpatient, had hemoptysis when on Xarelto), OSA on CPAP, Prostate cancer, and hypermetabolic RLL mass with left adrenal nodule (diagnosed on PET scan 03/2015) who was admitted with a CHF exacerbation and Afib with RVR 09/26/17. Since then he has undergone thoracentesis 7/17 and liver biopsy 7/22. Patient has been hypotensive which has limited ability to diurese him. Amiodarone infusion was stopped as there was concern it was causing hypotension. He went into Afib with RVR and developed flash pulmonary edema, ultimately requiring intubation. He was transferred to ICU. Heart Failure Team had been following and started milrinone and levophed infusions, as well as a lasix infusion. By 7/26 he had improved and was able to come off milrinone and the vent. Course then became complicated by hemoptysis and hematuria.   Significant Events: 7/16 Admit to cardiology 7/23 Afib with RVR > Flash pulm edema > Respiratory failure requiring intubation 7/28 extubated  7/30 TRH assumed care   Subjective: In good spirits.  Resting comfortably in bed.  Denies cp, sob, n/v, or abdom pain.  While I was on the unit he developed RVR w/ HR 120>155.  I ordered 10mg  IV lopressor, and HR improved to 70 prior to my leaving the unit.    Assessment & Plan:  Acute Hypoxic and Hypercapneic Respiratory failure secondary to flash pulmonary edema - resolved   Metastatic Adenocarcinoma of the Lung, large right infrahilar mass, liver lesion, associated hemoptysis Has been seen by Oncology - staging underway w/ MRI brain needed to complete - molecular testing of biopsy pending, which generally takes a few weeks - off anticoag due to hemopytsis - not felt to be a candidate for chemotherapy, but immunotherapy to be considered if  testing proves it is an option   Right pleural effusion s/p thoracentesis 7/17 with cytology showing reactive mesothelial cells - stable on f/u CXR today, and not presently clinically significant   Afib with RVR Care as per CHF Team - rate not yet consistently controlled - follow on tele   Acute systolic CHF exacerbation Echo now shows EF 10-15% from baseline of 25% - managed by Advanced Heart Failure Service  AKI crt slowly improving - cont to follow trend    Normocytic Anemia Hgb stable   H flu Pneumonia To complete 7 days of abx tx   Newly diagnosed DM no prior hx of DM but A1c this admit meets criteria at 6.9 - utilizing insulin for now - my be able to transition to oral meds at time of d/c - stop lantus and follow w/ SSI only - avoid modifying diet if able given CA diagnosis and likelihood of FTT/malnutrition in near future   DVT prophylaxis: SCDs Code Status: FULL CODE Family Communication: no family present at time of exam  Disposition Plan:   Consultants:  PCCM Oncology CHF Team   Antimicrobials:  Rocephin 7/25 >  Objective: Blood pressure 102/68, pulse 75, temperature (!) 97.4 F (36.3 C), temperature source Oral, resp. rate (!) 31, height 5\' 11"  (1.803 m), weight 98.8 kg (217 lb 13 oz), SpO2 95 %.  Intake/Output Summary (Last 24 hours) at 10/10/2017 1220 Last data filed at 10/10/2017 1211 Gross per 24 hour  Intake 530 ml  Output 1650 ml  Net -1120 ml   Filed Weights   10/08/17  3267 10/09/17 0410 10/10/17 0500  Weight: 99.4 kg (219 lb 2.2 oz) 98.6 kg (217 lb 6 oz) 98.8 kg (217 lb 13 oz)    Examination: General: No acute respiratory distress Lungs: Clear to auscultation bilaterally without wheezes or crackles Cardiovascular: Regular rate w/o gallup or rub  Abdomen: Nontender, nondistended, soft, bowel sounds positive, no rebound, no ascites, no appreciable mass Extremities: 1+ edema B LE   CBC: Recent Labs  Lab 10/03/17 2347  10/08/17 0312  10/09/17 0411 10/10/17 0444  WBC 11.5*   < > 8.0 6.7 7.5  NEUTROABS 9.2*  --   --   --   --   HGB 11.5*   < > 9.8* 9.5* 9.7*  HCT 37.2*   < > 29.6* 29.1* 30.0*  MCV 94.7   < > 89.2 90.1 90.9  PLT 295   < > 247 253 286   < > = values in this interval not displayed.   Basic Metabolic Panel: Recent Labs  Lab 10/03/17 2347 10/04/17 0512  10/06/17 0728  10/08/17 0312 10/09/17 0411 10/10/17 0444  NA 140 141   < >  --    < > 135 138 137  K 4.9 3.9   < >  --    < > 3.2* 3.5 3.8  CL 97* 101   < >  --    < > 94* 98 100  CO2 30 29   < >  --    < > 31 29 27   GLUCOSE 238* 124*   < >  --    < > 134* 110* 86  BUN 16 15   < >  --    < > 19 21 17   CREATININE 1.55* 1.56*   < >  --    < > 1.49* 1.49* 1.41*  CALCIUM 8.8* 8.5*   < >  --    < > 8.6* 8.3* 8.7*  MG 2.2 2.0  --  1.8  --  2.2  --   --   PHOS 6.8* 4.3  --   --   --   --   --   --    < > = values in this interval not displayed.   GFR: Estimated Creatinine Clearance: 60.9 mL/min (A) (by C-G formula based on SCr of 1.41 mg/dL (H)).  Liver Function Tests: Recent Labs  Lab 10/06/17 0326  AST 27  ALT 24  ALKPHOS 61  BILITOT 1.0  PROT 6.9  ALBUMIN 2.2*    Coagulation Profile: Recent Labs  Lab 10/06/17 0326  INR 1.53    Cardiac Enzymes: Recent Labs  Lab 10/03/17 2347 10/04/17 0512 10/04/17 1046  TROPONINI 0.04* 0.04* 0.04*    HbA1C: Hgb A1c MFr Bld  Date/Time Value Ref Range Status  10/07/2017 04:06 PM 6.9 (H) 4.8 - 5.6 % Final    Comment:    (NOTE) Pre diabetes:          5.7%-6.4% Diabetes:              >6.4% Glycemic control for   <7.0% adults with diabetes     CBG: Recent Labs  Lab 10/09/17 0656 10/09/17 1144 10/09/17 1637 10/09/17 2155 10/10/17 0800  GLUCAP 88 101* 100* 96 83    Recent Results (from the past 240 hour(s))  Culture, respiratory (tracheal aspirate)     Status: None   Collection Time: 10/03/17 12:11 AM  Result Value Ref Range Status   Specimen Description TRACHEAL ASPIRATE   Final  Special Requests NONE  Final   Gram Stain   Final    RARE WBC PRESENT, PREDOMINANTLY PMN NO ORGANISMS SEEN    Culture   Final    FEW HAEMOPHILUS INFLUENZAE BETA LACTAMASE NEGATIVE RESULT CALLED TO, READ BACK BY AND VERIFIED WITH: RN Mayme Genta [id number] MLM Performed at [facility], [place] [address], [place], [place] [address]    Report Status 10/06/2017 FINAL  Final  MRSA PCR Screening     Status: None   Collection Time: 10/04/17  4:32 AM  Result Value Ref Range Status   MRSA by PCR NEGATIVE NEGATIVE Final    Comment:        The GeneXpert MRSA Assay (FDA approved for NASAL specimens only), is one component of a comprehensive MRSA colonization surveillance program. It is not intended to diagnose MRSA infection nor to guide or monitor treatment for MRSA infections. Performed at [facility], [place] [address] [place], [place] [address]      Scheduled Meds: . amiodarone  400 mg Oral BID  . Chlorhexidine Gluconate Cloth  6 each Topical Daily  . digoxin  0.125 mg Oral Daily  . furosemide  80 mg Oral BID  . insulin aspart  0-15 Units Subcutaneous TID WC  . insulin glargine  10 Units Subcutaneous Daily  . losartan  25 mg Oral QPM  . pantoprazole  40 mg Oral Daily  . potassium chloride  40 mEq Oral Daily  . sodium chloride flush  10-40 mL Intracatheter Q12H  . sodium chloride flush  3 mL Intravenous Q12H  . spironolactone  25 mg Oral Daily   Continuous Infusions: . sodium chloride    . sodium chloride Stopped (10/09/17 0956)  . cefTRIAXone (ROCEPHIN)  IV 1 g (10/09/17 2123)  . fentaNYL infusion INTRAVENOUS Stopped (10/06/17 0804)  . norepinephrine (LEVOPHED) Adult infusion Stopped (10/08/17 1420)  . phenylephrine (NEO-SYNEPHRINE) Adult infusion Stopped (10/04/17 1046)     LOS: 14 days   Cherene Altes, MD [facility] Office  [phone] Pager - Text Page per Amion  If 7PM-7AM, please contact night-coverage per Amion 10/10/2017, 12:20  PM

## 2017-10-11 ENCOUNTER — Inpatient Hospital Stay (HOSPITAL_COMMUNITY): Payer: 59

## 2017-10-11 DIAGNOSIS — I639 Cerebral infarction, unspecified: Secondary | ICD-10-CM

## 2017-10-11 LAB — BASIC METABOLIC PANEL
Anion gap: 7 (ref 5–15)
BUN: 18 mg/dL (ref 8–23)
CHLORIDE: 100 mmol/L (ref 98–111)
CO2: 31 mmol/L (ref 22–32)
CREATININE: 1.34 mg/dL — AB (ref 0.61–1.24)
Calcium: 8.7 mg/dL — ABNORMAL LOW (ref 8.9–10.3)
GFR calc Af Amer: >60 mL/min (ref >60)
GFR calc non Af Amer: 53 mL/min — ABNORMAL LOW (ref >60)
Glucose, Bld: 93 mg/dL (ref 70–99)
Potassium: 4 mmol/L (ref 3.5–5.1)
Sodium: 138 mmol/L (ref 135–145)

## 2017-10-11 LAB — CBC
HCT: 31 % — ABNORMAL LOW (ref 39.0–52.0)
HEMOGLOBIN: 10.2 g/dL — AB (ref 13.0–17.0)
MCH: 29.8 pg (ref 26.0–34.0)
MCHC: 32.9 g/dL (ref 30.0–36.0)
MCV: 90.6 fL (ref 78.0–100.0)
Platelets: 313 10*3/uL (ref 150–400)
RBC: 3.42 MIL/uL — ABNORMAL LOW (ref 4.22–5.81)
RDW: 14.4 % (ref 11.5–15.5)
WBC: 7.8 10*3/uL (ref 4.0–10.5)

## 2017-10-11 LAB — GLUCOSE, CAPILLARY
GLUCOSE-CAPILLARY: 83 mg/dL (ref 70–99)
GLUCOSE-CAPILLARY: 62 mg/dL — AB (ref 70–99)
GLUCOSE-CAPILLARY: 153 mg/dL — AB (ref 70–99)
GLUCOSE-CAPILLARY: 89 mg/dL (ref 70–99)
Glucose-Capillary: 117 mg/dL — ABNORMAL HIGH (ref 70–99)

## 2017-10-11 LAB — MAGNESIUM: Magnesium: 2.2 mg/dL (ref 1.7–2.4)

## 2017-10-11 LAB — COOXEMETRY PANEL
CARBOXYHEMOGLOBIN: 1.7 % — AB (ref 0.5–1.5)
Carboxyhemoglobin: 1.5 % (ref 0.5–1.5)
Methemoglobin: 1 % (ref 0.0–1.5)
Methemoglobin: 1.7 % — ABNORMAL HIGH (ref 0.0–1.5)
O2 SAT: 49.8 %
O2 Saturation: 41.3 %
TOTAL HEMOGLOBIN: 10.1 g/dL — AB (ref 12.0–16.0)
Total hemoglobin: 10.4 g/dL — ABNORMAL LOW (ref 12.0–16.0)

## 2017-10-11 LAB — HEPATIC FUNCTION PANEL
ALK PHOS: 64 U/L (ref 38–126)
ALT: 29 U/L (ref 0–44)
AST: 24 U/L (ref 15–41)
Albumin: 2.5 g/dL — ABNORMAL LOW (ref 3.5–5.0)
BILIRUBIN INDIRECT: 0.5 mg/dL (ref 0.3–0.9)
BILIRUBIN TOTAL: 0.7 mg/dL (ref 0.3–1.2)
Bilirubin, Direct: 0.2 mg/dL (ref 0.0–0.2)
Total Protein: 7.7 g/dL (ref 6.5–8.1)

## 2017-10-11 MED ORDER — GADOBENATE DIMEGLUMINE 529 MG/ML IV SOLN
20.0000 mL | Freq: Once | INTRAVENOUS | Status: AC
  Administered 2017-10-11: 20 mL via INTRAVENOUS

## 2017-10-11 MED ORDER — FUROSEMIDE 80 MG PO TABS
80.0000 mg | ORAL_TABLET | Freq: Every day | ORAL | Status: DC
  Administered 2017-10-12: 80 mg via ORAL
  Filled 2017-10-11: qty 1

## 2017-10-11 MED ORDER — FUROSEMIDE 40 MG PO TABS
40.0000 mg | ORAL_TABLET | Freq: Every evening | ORAL | Status: DC
  Administered 2017-10-11: 40 mg via ORAL
  Filled 2017-10-11: qty 1

## 2017-10-11 MED ORDER — ASPIRIN EC 81 MG PO TBEC
81.0000 mg | DELAYED_RELEASE_TABLET | Freq: Every day | ORAL | Status: DC
  Administered 2017-10-11 – 2017-10-12 (×2): 81 mg via ORAL
  Filled 2017-10-11 (×2): qty 1

## 2017-10-11 NOTE — Progress Notes (Signed)
*  Preliminary Results*  Carotid artery duplex has been completed. Bilateral internal carotid arteries are 1-39%. Vertebral arteries are patent with antegrade flow.  10/11/2017 11:39 AM  Jinny Blossom Dawna Part

## 2017-10-11 NOTE — Progress Notes (Addendum)
PROGRESS NOTE    Logan [name]  ERX:[id number] DOB: 1950-07-08 DOA: 09/26/2017 PCP: [name], MD   Brief Narrative:  67yo M with hx CHF (EF 25-30%), HTN, Afib (on ASA as outpatient, had hemoptysis when on Xarelto), OSA on CPAP, Prostate cancer, and hypermetabolic RLL mass with left adrenal nodule (diagnosed on PET scan 03/2015) who was admitted with a CHF exacerbation and Afib with RVR 09/26/17. Since then he has undergone thoracentesis 7/17 and liver biopsy 7/22. Patient has been hypotensive which has limited ability to diurese him.Amiodarone infusion was stopped as there was concern it was causing hypotension. He went into Afib with RVR and developed flash pulmonary edema, ultimately requiring intubation. He was transferred to ICU. Heart Failure Team had been following and started milrinone and levophed infusions, as well as a lasix infusion. By 7/26 he had improved and was able to come off milrinone and the vent. Course then became complicated by hemoptysis and hematuria.     Assessment & Plan:   Active Problems:   Acute systolic heart failure (HCC)   Atrial fibrillation with RVR (HCC)   Acute on chronic combined systolic and diastolic congestive heart failure (HCC)   Hepatic lesion   Flash pulmonary edema (HCC)   Non-small cell lung cancer (NSCLC) (HCC)   Acute respiratory failure (HCC)  Acute Hypoxic and hypercapnic   Respiratory Failure;  flash pulmonary edema. Resolved.   Acute stroke , cerebellum vs mets.  Neurology consulted.  Carotid doppler; 1-39 % stenosis.  Discussed with patient. He agrees to be back on aspirin 81 mg, he did fine before. He has only cough 2-3 times today, very small amount. Will monitor on low dose aspirin.   Metastatic Adenocarcinoma of the Lung; large right infrahilar mass; liver lesion.  MRI done to evaluate for mets.  Patient needs to follow up with Dr Burr Medico for further evaluation and treatment. Awaiting molecular testing to determine  if immunotherapy is an option.  Hemoptysis; improved. Today only minimal amount.   Right pleural effusion;  s/p thoracentesis 7/17 with cytology showing reactive mesothelial cells - stable on f/u CXR.   A fib RVR;  Per cardiology On amiodarone. Digoxin  Not on anticoagulation due to hemoptysis.   Acute systolic CHF exacerbation;  Echo now shows EF 10-15% from baseline of 25% -  Per HF team.  On oral lasix, cozaar, spironolactone. .   AKI;  Improved.   Normocytic anemia;  Follow hb trend.   H flu PNA; on ceftriaxone.  Needs 7 days.   Newly diagnosed DM;  no prior hx of DM but A1c this admit meets criteria at 6.9      DVT prophylaxis: SCD, due to hemoptysis.  Code Status: Full code.  Family Communication: care discussed with patient.  Disposition Plan: home when stable.    Consultants:   HF team  Oncology  PCCM   Procedures/ Events;  7/16 Admit to cardiology 7/23 Afib with RVR > Flash pulm edema > Respiratory failure requiring intubation 7/28 extubated  7/30 TRH assumed care      Antimicrobials:  Rocephin 7/25   Subjective: Sitting in chair. No weakness, hemoptysis improved since he has been off heparin.  He would like to take again his CBD.    Objective: Vitals:   10/10/17 2016 10/10/17 2249 10/11/17 0347 10/11/17 0352  BP: 99/60 100/64 101/64   Pulse: 80 99 97   Resp: (!) 33 (!) 33 (!) 27   Temp: 98 F (36.7 C) (!) 97.5 F (  36.4 C) 97.9 F (36.6 C)   TempSrc: Oral Oral Oral   SpO2:  99% 92%   Weight:    97.7 kg (215 lb 6.2 oz)  Height:    5\' 11"  (1.803 m)    Intake/Output Summary (Last 24 hours) at 10/11/2017 0720 Last data filed at 10/11/2017 0350 Gross per 24 hour  Intake -  Output 2350 ml  Net -2350 ml   Filed Weights   10/09/17 0410 10/10/17 0500 10/11/17 0352  Weight: 98.6 kg (217 lb 6 oz) 98.8 kg (217 lb 13 oz) 97.7 kg (215 lb 6.2 oz)    Examination:  General exam: Appears calm and comfortable  Respiratory system:  Clear to auscultation. Respiratory effort normal. Cardiovascular system: S1 & S2 heard, RRR. No JVD, murmurs, rubs, gallops or clicks. No pedal edema. Gastrointestinal system: Abdomen is nondistended, soft and nontender. No organomegaly or masses felt. Normal bowel sounds heard. Central nervous system: Alert and oriented. No focal neurological deficits. Extremities: Symmetric 5 x 5 power. Skin: No rashes, lesions or ulcers Psychiatry;  Mood & affect appropriate.     Data Reviewed: I have personally reviewed following labs and imaging studies  CBC: Recent Labs  Lab 10/07/17 0313 10/08/17 0312 10/09/17 0411 10/10/17 0444 10/11/17 0354  WBC 10.1 8.0 6.7 7.5 7.8  HGB 10.1* 9.8* 9.5* 9.7* 10.2*  HCT 30.9* 29.6* 29.1* 30.0* 31.0*  MCV 89.3 89.2 90.1 90.9 90.6  PLT 236 247 253 286 563   Basic Metabolic Panel: Recent Labs  Lab 10/06/17 0728  10/07/17 0317 10/08/17 0312 10/09/17 0411 10/10/17 0444 10/11/17 0354  NA  --    < > 135 135 138 137 138  K  --    < > 3.7 3.2* 3.5 3.8 4.0  CL  --    < > 96* 94* 98 100 100  CO2  --    < > 30 31 29 27 31   GLUCOSE  --    < > 111* 134* 110* 86 93  BUN  --    < > 19 19 21 17 18   CREATININE  --    < > 1.62* 1.49* 1.49* 1.41* 1.34*  CALCIUM  --    < > 8.7* 8.6* 8.3* 8.7* 8.7*  MG 1.8  --   --  2.2  --   --  2.2   < > = values in this interval not displayed.   GFR: Estimated Creatinine Clearance: 63.8 mL/min (A) (by C-G formula based on SCr of 1.34 mg/dL (H)). Liver Function Tests: Recent Labs  Lab 10/06/17 0326 10/11/17 0354  AST 27 24  ALT 24 29  ALKPHOS 61 64  BILITOT 1.0 0.7  PROT 6.9 7.7  ALBUMIN 2.2* 2.5*   No results for input(s): LIPASE, AMYLASE in the last 168 hours. No results for input(s): AMMONIA in the last 168 hours. Coagulation Profile: Recent Labs  Lab 10/06/17 0326  INR 1.53   Cardiac Enzymes: Recent Labs  Lab 10/04/17 1046  TROPONINI 0.04*   BNP (last 3 results) No results for input(s): PROBNP in the  last 8760 hours. HbA1C: No results for input(s): HGBA1C in the last 72 hours. CBG: Recent Labs  Lab 10/09/17 2155 10/10/17 0800 10/10/17 1213 10/10/17 1804 10/10/17 2131  GLUCAP 96 83 185* 120* 96   Lipid Profile: No results for input(s): CHOL, HDL, LDLCALC, TRIG, CHOLHDL, LDLDIRECT in the last 72 hours. Thyroid Function Tests: No results for input(s): TSH, T4TOTAL, FREET4, T3FREE, THYROIDAB in the last 72  hours. Anemia Panel: No results for input(s): VITAMINB12, FOLATE, FERRITIN, TIBC, IRON, RETICCTPCT in the last 72 hours. Sepsis Labs: No results for input(s): PROCALCITON, LATICACIDVEN in the last 168 hours.  Recent Results (from the past 240 hour(s))  Culture, respiratory (tracheal aspirate)     Status: None   Collection Time: 10/03/17 12:11 AM  Result Value Ref Range Status   Specimen Description TRACHEAL ASPIRATE  Final   Special Requests NONE  Final   Gram Stain   Final    RARE WBC PRESENT, PREDOMINANTLY PMN NO ORGANISMS SEEN    Culture   Final    FEW HAEMOPHILUS INFLUENZAE BETA LACTAMASE NEGATIVE RESULT CALLED TO, READ BACK BY AND VERIFIED WITH: RN Mayme Genta [id number] MLM Performed at [facility], [address] [place], [place] [address]    Report Status 10/06/2017 FINAL  Final  MRSA PCR Screening     Status: None   Collection Time: 10/04/17  4:32 AM  Result Value Ref Range Status   MRSA by PCR NEGATIVE NEGATIVE Final    Comment:        The GeneXpert MRSA Assay (FDA approved for NASAL specimens only), is one component of a comprehensive MRSA colonization surveillance program. It is not intended to diagnose MRSA infection nor to guide or monitor treatment for MRSA infections. Performed at [facility], [place] [address] [place], [place] [address]          Radiology Studies: Mr Brain Wo Contrast  Result Date: 10/10/2017 CLINICAL DATA:  Metastatic lung cancer. EXAM: MRI HEAD WITHOUT CONTRAST TECHNIQUE: Multiplanar, multiecho pulse  sequences of the brain and surrounding structures were obtained without intravenous contrast. COMPARISON:  None. FINDINGS: BRAIN: There is a subcentimeter focus of abnormal diffusion restriction in the left cerebellar hemisphere. No other diffusion abnormality. Minimal adjacent edema. The midline structures are normal. There are no old infarcts. The white matter signal is normal for the patient's age. The CSF spaces are normal for age, with no hydrocephalus. Susceptibility-sensitive sequences show no chronic microhemorrhage or superficial siderosis. VASCULAR: Major intracranial arterial and venous sinus flow voids are preserved. SKULL AND UPPER CERVICAL SPINE: The visualized skull base, calvarium, upper cervical spine and extracranial soft tissues are normal. SINUSES/ORBITS: No fluid levels or advanced mucosal thickening. No mastoid or middle ear effusion. The orbits are normal. IMPRESSION: 1. Single subcentimeter focus of diffusion restriction in the left cerebellum is favored to be an acute ischemic infarct, given the lack of surrounding edema. A small metastasis could also have this appearance, but perilesional edema is typically much greater. If there are no contraindications, contrast administration might be helpful. 2. Otherwise normal brain for age. Electronically Signed   By: Ulyses Jarred M.D.   On: 10/10/2017 18:48   Dg Chest Port 1 View  Result Date: 10/10/2017 CLINICAL DATA:  67 year old with shortness of breath and cough. Subsequent encounter. EXAM: PORTABLE CHEST 1 VIEW COMPARISON:  10/05/2017 chest x-ray. FINDINGS: Endotracheal tube and nasogastric tube removed. Right PICC line tip mid to distal superior vena cava level. Asymmetric airspace disease consistent with pulmonary edema and right-sided pleural effusion. In this setting, cannot exclude right lower lobe infiltrate. Cardiomegaly. No obvious pneumothorax. IMPRESSION: Endotracheal tube and nasogastric tube removed. Pulmonary edema with  right-sided pleural effusion and cardiomegaly without significant change from prior exam. Electronically Signed   By: Genia Del M.D.   On: 10/10/2017 09:27        Scheduled Meds: . amiodarone  400 mg Oral BID  .  Chlorhexidine Gluconate Cloth  6 each Topical Daily  . digoxin  0.125 mg Oral Daily  . furosemide  80 mg Oral BID  . insulin aspart  0-5 Units Subcutaneous QHS  . insulin aspart  0-9 Units Subcutaneous TID WC  . losartan  25 mg Oral QPM  . pantoprazole  40 mg Oral Daily  . potassium chloride  40 mEq Oral Daily  . sodium chloride flush  10-40 mL Intracatheter Q12H  . spironolactone  25 mg Oral Daily   Continuous Infusions: . cefTRIAXone (ROCEPHIN)  IV 1 g (10/10/17 2156)     LOS: 15 days    Time spent: 35 minutes.     Elmarie Shiley, MD [facility] Pager [phone]  If 7PM-7AM, please contact night-coverage [email or web address] Password [email or web address] 10/11/2017, 7:20 AM

## 2017-10-11 NOTE — Progress Notes (Signed)
Repeat coox 41%. Suspect falsely low.  Discussed with MD. Asymptomatic and stable labs. OK to pull PICC.   Legrand Como [address]" [place], PA-C 10/11/2017 11:04 AM

## 2017-10-11 NOTE — Progress Notes (Addendum)
Patient ID: Logan [name], male   DOB: 02-10-1951, 67 y.o.   MRN: [id number]     Advanced Heart Failure Rounding Note  PCP-Cardiologist: Sinclair Grooms, MD   Subjective:    Extubated 7/26  Off NE and moved to Stepdown 10/09/17  Feeling great this am. Denies SOB, lightheadedness or dizziness walking halls. Anxious to go home.   CVP not connected. Have asked RN to set up so we can check CVP once more prior to pulling PICC. Coox 49.8% but suspect falsely low. Will recheck.   Objective:   Weight Range: 215 lb 6.2 oz (97.7 kg) Body mass index is 30.04 kg/m.   Vital Signs:   Temp:  [97.4 F (36.3 C)-98.3 F (36.8 C)] 97.9 F (36.6 C) (07/31 0347) Pulse Rate:  [75-103] 97 (07/31 0347) Resp:  [22-33] 27 (07/31 0347) BP: (97-107)/(60-73) 101/64 (07/31 0347) SpO2:  [92 %-99 %] 92 % (07/31 0347) Weight:  [215 lb 6.2 oz (97.7 kg)] 215 lb 6.2 oz (97.7 kg) (07/31 0352) Last BM Date: 10/10/17  Weight change: Filed Weights   10/09/17 0410 10/10/17 0500 10/11/17 0352  Weight: 217 lb 6 oz (98.6 kg) 217 lb 13 oz (98.8 kg) 215 lb 6.2 oz (97.7 kg)    Intake/Output:   Intake/Output Summary (Last 24 hours) at 10/11/2017 0719 Last data filed at 10/11/2017 0350 Gross per 24 hour  Intake -  Output 2350 ml  Net -2350 ml    Physical Exam   General: NAD HEENT: Normal Neck: Supple. JVP 5-6. Carotids 2+ bilat; no bruits. No thyromegaly or nodule noted. Cor: PMI lateral. Slightly tachy, Regular this am. No M/G/R noted Lungs: CTAB, normal effort. Abdomen: Soft, non-tender, non-distended, no HSM. No bruits or masses. +BS  Extremities: No cyanosis, clubbing, or rash. R and LLE no edema.  Neuro: Alert & orientedx3, cranial nerves grossly intact. moves all 4 extremities w/o difficulty. Affect pleasant   Telemetry   Mostly NSR with intermittent episodes of Afib in 90-100s, personally reviewed.   Labs    CBC Recent Labs    10/10/17 0444 10/11/17 0354  WBC 7.5 7.8  HGB 9.7*  10.2*  HCT 30.0* 31.0*  MCV 90.9 90.6  PLT 286 315   Basic Metabolic Panel Recent Labs    10/10/17 0444 10/11/17 0354  NA 137 138  K 3.8 4.0  CL 100 100  CO2 27 31  GLUCOSE 86 93  BUN 17 18  CREATININE 1.41* 1.34*  CALCIUM 8.7* 8.7*  MG  --  2.2   Liver Function Tests Recent Labs    10/11/17 0354  AST 24  ALT 29  ALKPHOS 64  BILITOT 0.7  PROT 7.7  ALBUMIN 2.5*   No results for input(s): LIPASE, AMYLASE in the last 72 hours. Cardiac Enzymes No results for input(s): CKTOTAL, CKMB, CKMBINDEX, TROPONINI in the last 72 hours.  BNP: BNP (last 3 results) Recent Labs    09/26/17 0955 10/03/17 2347  BNP 537.4* 362.5*   ProBNP (last 3 results) No results for input(s): PROBNP in the last 8760 hours.   D-Dimer No results for input(s): DDIMER in the last 72 hours. Hemoglobin A1C No results for input(s): HGBA1C in the last 72 hours. Fasting Lipid Panel No results for input(s): CHOL, HDL, LDLCALC, TRIG, CHOLHDL, LDLDIRECT in the last 72 hours. Thyroid Function Tests No results for input(s): TSH, T4TOTAL, T3FREE, THYROIDAB in the last 72 hours.  Invalid input(s): FREET3  Other results:  Imaging   Mr Brain Wo  Contrast  Result Date: 10/10/2017 CLINICAL DATA:  Metastatic lung cancer. EXAM: MRI HEAD WITHOUT CONTRAST TECHNIQUE: Multiplanar, multiecho pulse sequences of the brain and surrounding structures were obtained without intravenous contrast. COMPARISON:  None. FINDINGS: BRAIN: There is a subcentimeter focus of abnormal diffusion restriction in the left cerebellar hemisphere. No other diffusion abnormality. Minimal adjacent edema. The midline structures are normal. There are no old infarcts. The white matter signal is normal for the patient's age. The CSF spaces are normal for age, with no hydrocephalus. Susceptibility-sensitive sequences show no chronic microhemorrhage or superficial siderosis. VASCULAR: Major intracranial arterial and venous sinus flow voids are  preserved. SKULL AND UPPER CERVICAL SPINE: The visualized skull base, calvarium, upper cervical spine and extracranial soft tissues are normal. SINUSES/ORBITS: No fluid levels or advanced mucosal thickening. No mastoid or middle ear effusion. The orbits are normal. IMPRESSION: 1. Single subcentimeter focus of diffusion restriction in the left cerebellum is favored to be an acute ischemic infarct, given the lack of surrounding edema. A small metastasis could also have this appearance, but perilesional edema is typically much greater. If there are no contraindications, contrast administration might be helpful. 2. Otherwise normal brain for age. Electronically Signed   By: Ulyses Jarred M.D.   On: 10/10/2017 18:48    Medications:    Scheduled Medications: . amiodarone  400 mg Oral BID  . Chlorhexidine Gluconate Cloth  6 each Topical Daily  . digoxin  0.125 mg Oral Daily  . furosemide  80 mg Oral BID  . insulin aspart  0-5 Units Subcutaneous QHS  . insulin aspart  0-9 Units Subcutaneous TID WC  . losartan  25 mg Oral QPM  . pantoprazole  40 mg Oral Daily  . potassium chloride  40 mEq Oral Daily  . sodium chloride flush  10-40 mL Intracatheter Q12H  . spironolactone  25 mg Oral Daily    Infusions: . cefTRIAXone (ROCEPHIN)  IV 1 g (10/10/17 2156)    PRN Medications: acetaminophen, docusate, ondansetron (ZOFRAN) IV, sodium chloride flush, traZODone  Patient Profile   [name] is 67 year old with history of HTN, A Fib, OSA, untreated prostate cancer 2012 , RLL mass 2015 declined treatment, hemoptysis for the last 6 months (he stopped xarelto), and chronic systolic heart failure.   Admitted with A fib RVR and A/C systolic heart failure.   Assessment/Plan   1. Acute on chronic systolic CHF: -  TEE 7/89 with EF 15%. Long history of nonischemic cardiomyopathy. No ICD. He was admitted with afib/RVR and dyspnea. Failed TEE-guided DCCV and intubated likely due to pulmonary edema. He  is now off norepinephrine.  - He remains in atrial fibrillation. - Coox 49.8% this am. Suspect underestimated with early draw. Will resend.  - CVP not connected. Will have RN set up so we can check once work - Continue lasix 80 mg BID by mouth => can send home on Lasix 80 qam/40 qpm.  - Continue digoxin 0.125 mg daily.  - Continue spironolactone 25 mg daily  - Continue losartan 25 mg qhs -Long-term prognosis with combination of metastatic lung cancer and severe CHF(low output) is very poor. He is not a candidate for advanced cardiac therapies.  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 Seen by oncology, not candidate for chemotherapy but may be candidate for immunotherapy depending on genomics.  - No change to current plan.   3. Hemoptysis: - Has had to come off heparin gtt with hemoptysis.  This has now resolved off anticoagulation.  Likely from lung mass.  It appears that we will not be able to anticoagulate him.  - No change to current plan.   4. Atrial fibrillation:  - Initially with RVR, likely was driving CHF. He failed DCCV but then with additionofamiodaronehad gone back to NSR on 7/24, but on 7/25 he was back in afib.  - He appears to be in NSR 80-90s this am, but intermittently in Afib overnight with rates 90-100s - Continue po amiodarone 400 mg BID for now.  - Continue digoxin for rate control - As above, unable to anticoagulate at this time with hemoptysis.  5. AKI: - Stable at 1.34 today. 6. H/o elevated PSA:  - Has declined workup in past 7. Acute hypoxemic respiratory failure:  - Suspect due to pulmonary edema + large RLL mass/hemoptysis.  - H influenzae in sputum, treating with ceftriaxone x 7 days.  - Afebrile.  He will stable for discharge for home from HF perspective pending repeat Coox.   HF medications for home.  - Lasix 80 qam/40 qpm with KCl 40 daily.  - Digoxin 0.125 mg daily - Losartan 25 mg qhs - Spironolactone 25 mg daily -  Amiodarone 400 mg BID x 1 week, then 200 mg BID.   HF follow up has been set up and placed in AVS.   Length of Stay: [place], Hershal Coria  10/11/2017, 7:19 AM  Advanced Heart Failure Team Pager [phone] (M-F; 7a - 4p)  Please contact [facility] for night-coverage after hours (4p -7a ) and weekends on [email or web address]  Patient seen with PA, agree with the above note.   Rhythm is in and out of what appears to be atrial flutter.  He is mostly in NSR but occasionally has runs of atypical atrial flutter (occurred once while I was in the room).    He feels good, creatinine down to 1.34.  No dyspnea walking around room.  Co-ox 50% this morning but done in early am while sleeping.   On exam, no JVP, regular S1S2, no edema, decreased BS at bases.  As above, he seems to be predominantly in NSR.  Will continue amiodarone 400 mg bid (x 1 week, then 200 mg bid).  He is not going to be anticoagulated due to severe hemoptysis (reports mild bloody streaking when he coughs but much better now).  Cardioversion will not be a future option as long as he is off anticoagulation.   Volume status looks ok.  Can decrease Lasix to 80 qam/40 qpm for home.  Creatinine down to 1.34.  He will continue current digoxin, spironolactone, and losartan.  No BP room to titrate. Will resend co-ox.    Antibiotics per primary service, had H influenzae in sputum.   Poor overall prognosis with CHF and metastatic lung cancer.  To followup with oncology as outpatient re: treatment options.  From my standpoint, I think he can go home.  He will need the cardiac meds listed above.  Will have close followup in CHF clinic.   Loralie Champagne 10/11/2017 8:23 AM

## 2017-10-11 NOTE — Progress Notes (Signed)
CRITICAL VALUE ALERT  Critical Value:  BG 62  Date & Time Notied:  6060  Provider Notified: No  Orders Received/Actions taken: OJx2; NT will recheck in 47mins

## 2017-10-11 NOTE — Consult Note (Addendum)
Neurology Consultation  Reason for Consult: Incidental stroke on MRI  Referring Physician: Regulado  History is obtained from: Chart and patient  HPI: Logan [name] is a 67 y.o. male with just of heart failure, hypertension, A. fib who is on aspirin as an outpatient secondary to having hemoptysis when he was on Xarelto, prostate cancer, hypermetabolic right lower lobe mass with left adrenal nodule.  While hospitalized per notes he has been mostly normal sinus rhythm with intermittent episodes of A. Fib.  MRI brain was obtained to evaluate for mets to the brain.  The MRI showed a single subcentimeter focus of diffusion restriction within the left cerebellum.    LKW: Unknown tpa given?: no, no clinical abnormalities Premorbid modified Rankin scale (mRS): 2 Stroke comorbidities include hypertension  ROS: A 14 point ROS was performed and is negative except as noted in the HPI.  Past Medical History:  Diagnosis Date  . CHF (congestive heart failure) ([place])   . Chronic systolic heart failure ([place]) 04/15/2015    EF 30-35% by echo 2016 , [facility]   . Hypertension   . Lung mass 04/15/2015    Never biopsied. History of PET scan that raised concern.   . Metabolic syndrome 7/0/6237  . OSA on CPAP   . Paroxysmal atrial fibrillation ([place]) 04/15/2015   On apixaban   . Prostate cancer ([place])   . Thrombocytopenia ([place]) 04/15/2015     Family History  Problem Relation Age of Onset  . Parkinson's disease Mother   . Hypertension Father      Social History:   reports that he quit smoking about 43 years ago. His smoking use included cigarettes, cigars, and pipe. He quit after 6.50 years of use. He has never used smokeless tobacco. He reports that he drinks alcohol. He reports that he does not use drugs.  Medications  Current Facility-Administered Medications:  .  acetaminophen (TYLENOL) tablet 650 mg, 650 mg, Oral, Q6H PRN, Cherene Altes, MD .  amiodarone (PACERONE) tablet 400 mg, 400 mg,  Oral, BID, [name], MD, 400 mg at 10/11/17 6283 .  cefTRIAXone (ROCEPHIN) 1 g in sodium chloride 0.9 % 100 mL IVPB, 1 g, Intravenous, Q24H, Hammonds, Sharyn Blitz, MD, Last Rate: 200 mL/hr at 10/10/17 2156, 1 g at 10/10/17 2156 .  Chlorhexidine Gluconate Cloth 2 % PADS 6 each, 6 each, Topical, Daily, Hammonds, Sharyn Blitz, MD, 6 each at 10/09/17 [phone] .  digoxin (LANOXIN) tablet 0.125 mg, 0.125 mg, Oral, Daily, [name], MD, 0.125 mg at 10/11/17 6160 .  docusate (COLACE) 50 MG/5ML liquid 100 mg, 100 mg, Per Tube, BID PRN, Hammonds, Sharyn Blitz, MD .  furosemide (LASIX) tablet 40 mg, 40 mg, Oral, QPM, [name], MD .  Derrill Memo ON 10/12/2017] furosemide (LASIX) tablet 80 mg, 80 mg, Oral, Q breakfast, Aundra Dubin, Dalton S, MD .  insulin aspart (novoLOG) injection 0-5 Units, 0-5 Units, Subcutaneous, QHS, McClung, Dellis Filbert T, MD .  insulin aspart (novoLOG) injection 0-9 Units, 0-9 Units, Subcutaneous, TID WC, Cherene Altes, MD .  losartan (COZAAR) tablet 25 mg, 25 mg, Oral, QPM, [name], MD, 25 mg at 10/10/17 1759 .  ondansetron (ZOFRAN) injection 4 mg, 4 mg, Intravenous, Q6H PRN, Hilty, Nadean Corwin, MD .  pantoprazole (PROTONIX) EC tablet 40 mg, 40 mg, Oral, Daily, Hammonds, Sharyn Blitz, MD, 40 mg at 10/11/17 0928 .  potassium chloride SA (K-DUR,KLOR-CON) CR tablet 40 mEq, 40 mEq, Oral, Daily, [name], MD, 40 mEq at  10/11/17 7353 .  sodium chloride flush (NS) 0.9 % injection 10-40 mL, 10-40 mL, Intracatheter, Q12H, Hammonds, Sharyn Blitz, MD, 10 mL at 10/11/17 0929 .  sodium chloride flush (NS) 0.9 % injection 10-40 mL, 10-40 mL, Intracatheter, PRN, Hammonds, Sharyn Blitz, MD .  spironolactone (ALDACTONE) tablet 25 mg, 25 mg, Oral, Daily, [name], MD, 25 mg at 10/11/17 2992 .  traZODone (DESYREL) tablet 50 mg, 50 mg, Oral, QHS PRN, Gardiner Barefoot, NP, 50 mg at 10/10/17 2143   Exam: Current vital signs: BP (!) 93/59 (BP Location: Left Arm)   Pulse (!) 113    Temp 98.3 F (36.8 C) (Oral)   Resp (!) 28   Ht 5\' 11"  (1.803 m)   Wt 97.7 kg (215 lb 6.2 oz)   SpO2 93%   BMI 30.04 kg/m  Vital signs in last 24 hours: Temp:  [97.4 F (36.3 C)-98.3 F (36.8 C)] 98.3 F (36.8 C) (07/31 0748) Pulse Rate:  [75-113] 113 (07/31 0928) Resp:  [25-33] 28 (07/31 0748) BP: (93-102)/(59-71) 93/59 (07/31 0748) SpO2:  [92 %-99 %] 93 % (07/31 0748) Weight:  [97.7 kg (215 lb 6.2 oz)] 97.7 kg (215 lb 6.2 oz) (07/31 0352)  GENERAL: Awake, alert in NAD HEENT: - Normocephalic and atraumatic, dry mm, no LN++, no Thyromegally Ext: warm, well perfused, intact peripheral pulses,  NEURO:  Mental Status: AA&Ox3, patient is up standing front of the sink washing himself. Language: speech is clear.  Naming, repetition, fluency, and comprehension intact. Cranial Nerves: PERRL 2 mm/brisk. EOMI, visual fields full, no facial asymmetry, intact facial sensation intact, hearing intact, tongue/uvula/soft palate midline,  Motor: 5/5 throughout Tone is normal and bulk is normal Sensation- Intact to light touch bilaterally.  Coordination: FTN intact bilaterally, no ataxia in BLE. Gait-within normal limits  Labs I have reviewed labs in epic and the results pertinent to this consultation are:   CBC    Component Value Date/Time   WBC 7.8 10/11/2017 0354   RBC 3.42 (L) 10/11/2017 0354   HGB 10.2 (L) 10/11/2017 0354   HGB 14.5 02/07/2017 0904   HCT 31.0 (L) 10/11/2017 0354   HCT 44.4 02/07/2017 0904   PLT 313 10/11/2017 0354   PLT 146 (L) 02/07/2017 0904   MCV 90.6 10/11/2017 0354   MCV 92 02/07/2017 0904   MCH 29.8 10/11/2017 0354   MCHC 32.9 10/11/2017 0354   RDW 14.4 10/11/2017 0354   RDW 14.8 02/07/2017 0904   LYMPHSABS 1.6 10/03/2017 2347   MONOABS 0.4 10/03/2017 2347   EOSABS 0.1 10/03/2017 2347   BASOSABS 0.1 10/03/2017 2347    CMP     Component Value Date/Time   NA 138 10/11/2017 0354   NA 138 02/07/2017 0904   K 4.0 10/11/2017 0354   CL 100  10/11/2017 0354   CO2 31 10/11/2017 0354   GLUCOSE 93 10/11/2017 0354   BUN 18 10/11/2017 0354   BUN 16 02/07/2017 0904   CREATININE 1.34 (H) 10/11/2017 0354   CREATININE 1.04 04/21/2015 1320   CALCIUM 8.7 (L) 10/11/2017 0354   PROT 7.7 10/11/2017 0354   PROT 7.1 02/07/2017 0904   ALBUMIN 2.5 (L) 10/11/2017 0354   ALBUMIN 4.5 02/07/2017 0904   AST 24 10/11/2017 0354   ALT 29 10/11/2017 0354   ALKPHOS 64 10/11/2017 0354   BILITOT 0.7 10/11/2017 0354   BILITOT 0.5 02/07/2017 0904   GFRNONAA 53 (L) 10/11/2017 0354   GFRAA >60 10/11/2017 0354    Lipid Panel  Component Value Date/Time   CHOL 155 02/07/2017 0904   TRIG 104 10/03/2017 2347   HDL 36 (L) 02/07/2017 0904   CHOLHDL 4.3 02/07/2017 0904   LDLCALC 94 02/07/2017 0904   Echocardiogram, 09/26/17: Left ventricle: The cavity size was moderately dilated. Wall   thickness was normal. There was adverse spherical remodeling. The   estimated ejection fraction was in the range of 10% to 15%.   Severe diffuse hypokinesis with no identifiable regional   variations. - Mitral valve: There was mild to moderate regurgitation directed   posteriorly. - Left atrium: The atrium was moderately dilated. - Right ventricle: The cavity size was severely dilated. Systolic   function was severely reduced. - Right atrium: The atrium was moderately dilated. - Pericardium, extracardiac: A small pericardial effusion was   identified along the right atrial free wall. There was no   evidence of hemodynamic compromise.  Imaging I have reviewed the images obtained:  MRI examination of the brain--Single subcentimeter focus of diffusion restriction in the left cerebellum is favored to be an acute ischemic infarct, given the lack of surrounding edema. A small metastasis could also have this appearance, but perilesional edema is typically much greater.  Assessment: 67 year old male with history of prostate cancer and hypermetabolic right lower lobe  mass.  MRI brain was obtained to look for mets.  No mets were observed however he does have a punctate acute infarct in the left cerebellum.   1. Exam did not show any localizing or lateralizing abnormalities.  2. Stroke was most likely cardioembolic given intermittent atrial fibrillation as well as hypokinetic segments on echocardiogram with low EF of 10-15%.  3. Etiology for the stroke may also be hypercoagulability given hypermetabolic RLL mass and history of prostate CA.   Recommendations: -MRA of brain --Also will need post-contrast images to ensure that the stroke seen on MRI brain obtained yesterday is not actually an enhancing metastasis -Carotid Dopplers -A1c and LDL -Continue aspirin but may need to escalate to an anticoagulant --PT/OT --Cardiac telemetry --BP management --Stroke Team to follow in AM  Electronically signed: Dr. Kerney Elbe

## 2017-10-12 DIAGNOSIS — R042 Hemoptysis: Secondary | ICD-10-CM

## 2017-10-12 DIAGNOSIS — I63442 Cerebral infarction due to embolism of left cerebellar artery: Secondary | ICD-10-CM

## 2017-10-12 DIAGNOSIS — I6330 Cerebral infarction due to thrombosis of unspecified cerebral artery: Secondary | ICD-10-CM

## 2017-10-12 DIAGNOSIS — E785 Hyperlipidemia, unspecified: Secondary | ICD-10-CM

## 2017-10-12 DIAGNOSIS — J90 Pleural effusion, not elsewhere classified: Secondary | ICD-10-CM

## 2017-10-12 LAB — BASIC METABOLIC PANEL
Anion gap: 9 (ref 5–15)
BUN: 20 mg/dL (ref 8–23)
CALCIUM: 8.6 mg/dL — AB (ref 8.9–10.3)
CO2: 26 mmol/L (ref 22–32)
Chloride: 102 mmol/L (ref 98–111)
Creatinine, Ser: 1.37 mg/dL — ABNORMAL HIGH (ref 0.61–1.24)
GFR calc Af Amer: >60 mL/min (ref >60)
GFR calc non Af Amer: 52 mL/min — ABNORMAL LOW (ref >60)
GLUCOSE: 163 mg/dL — AB (ref 70–99)
Potassium: 4.1 mmol/L (ref 3.5–5.1)
Sodium: 137 mmol/L (ref 135–145)

## 2017-10-12 LAB — GLUCOSE, CAPILLARY
Glucose-Capillary: 99 mg/dL (ref 70–99)
Glucose-Capillary: 92 mg/dL (ref 70–99)

## 2017-10-12 LAB — CBC
HCT: 30.7 % — ABNORMAL LOW (ref 39.0–52.0)
Hemoglobin: 10.2 g/dL — ABNORMAL LOW (ref 13.0–17.0)
MCH: 29.8 pg (ref 26.0–34.0)
MCHC: 33.2 g/dL (ref 30.0–36.0)
MCV: 89.8 fL (ref 78.0–100.0)
Platelets: 331 10*3/uL (ref 150–400)
RBC: 3.42 MIL/uL — ABNORMAL LOW (ref 4.22–5.81)
RDW: 14.5 % (ref 11.5–15.5)
WBC: 8.2 10*3/uL (ref 4.0–10.5)

## 2017-10-12 MED ORDER — FUROSEMIDE 40 MG PO TABS: 40.0000 mg | ORAL_TABLET | Freq: Every evening | ORAL | 0 refills | Status: DC

## 2017-10-12 MED ORDER — PANTOPRAZOLE SODIUM 40 MG PO TBEC: 40.0000 mg | DELAYED_RELEASE_TABLET | Freq: Every day | ORAL | 0 refills | Status: DC

## 2017-10-12 MED ORDER — AMIODARONE HCL 400 MG PO TABS: 400.0000 mg | ORAL_TABLET | Freq: Two times a day (BID) | ORAL | 0 refills | Status: DC

## 2017-10-12 MED ORDER — SPIRONOLACTONE 25 MG PO TABS: 25.0000 mg | ORAL_TABLET | Freq: Every day | ORAL | 0 refills | Status: DC

## 2017-10-12 MED ORDER — LOSARTAN POTASSIUM 25 MG PO TABS: 25.0000 mg | ORAL_TABLET | Freq: Every evening | ORAL | 1 refills | Status: DC

## 2017-10-12 MED ORDER — FUROSEMIDE 80 MG PO TABS: 80.0000 mg | ORAL_TABLET | Freq: Every day | ORAL | 0 refills | Status: DC

## 2017-10-12 MED ORDER — POTASSIUM CHLORIDE CRYS ER 20 MEQ PO TBCR: 40.0000 meq | EXTENDED_RELEASE_TABLET | Freq: Every day | ORAL | 0 refills | Status: DC

## 2017-10-12 MED ORDER — DIGOXIN 125 MCG PO TABS: 0.1250 mg | ORAL_TABLET | Freq: Every day | ORAL | 0 refills | Status: DC

## 2017-10-12 MED ORDER — TRAZODONE HCL 50 MG PO TABS: 50.0000 mg | ORAL_TABLET | Freq: Every evening | ORAL | 0 refills | Status: DC | PRN

## 2017-10-12 NOTE — Care Management Note (Addendum)
Case Management Note Previous Note Created by Marvetta Gibbons RN, BSN Unit 4E-Case Manager [phone]  Patient Details  Name: Logan [name] MRN: [id number] Date of Birth: December 04, 1950  Subjective/Objective:   Pt admitted with afib s/p cardiov. Unsuccessful. Acute on chronic HF. Liver biopsy done on 7.22 for left adrenal nodule hx prostate cancer with RLL mass.                  Action/Plan: PTA pt lived at home with spouse, CM following for transition of care needs.   Expected Discharge Date:  10/12/17               Expected Discharge Plan:  Home/Self Care  In-House Referral:     Discharge planning Services  CM Consult  Post Acute Care Choice:    Choice offered to:  Patient  DME Arranged:  Oxygen DME Agency:  [place]:  RN [facility] Agency:  [facility]  Status of Service:  Completed, signed off  If discussed at Rank[place] of Stay Meetings, dates discussed:    Discharge Disposition:   Additional Comments: 10/12/2017  Pt to discharge home with oxygen and HH.   CM offered choice of DME/HH agency to pt - pt chose [facility] - agency contacted and DME referral accepted however unable to accept [facility] [employer] accepted insurance - pt/wife in agreement with [employer].   Pt declined [place].   No other CM needs determined at the time this note was written  10/11/17 Pt now with confirmed metastic CA with limited treatment options - CM requested palliative consult when appropriate. Pt now transferred to progressive unit.  CM discussed discharge planning with wife.  Wife plans to take pt home with her - pt doesn't need any equipment in the home and PT has signed off.  Pt has PCP and wife denied barriers with paying for medications.  CM communicated the need of daily weights and low salt diet at discharge  10/09/17 Pt remains in ICU.  Per bedside nurse pt is independently ambulating in room - neither PT nor OT eval warranted at this time. CM will continue to  follow for discharge needs  10/04/17- 1100- Kristi Webster RN, CM- pt tx to ICU last night after becoming unresponsive and requiring intubation for flash pulm. Edema. Currently on Vent  Maryclare Labrador, RN 10/12/2017, 3:49 PM

## 2017-10-12 NOTE — Progress Notes (Signed)
STROKE TEAM PROGRESS NOTE   SUBJECTIVE (INTERVAL HISTORY) His RN is at the bedside.  Overall he feels his condition is rapidly improving.  Walked with PT in the hallway, stable gait.  No focal neurological deficit.  Eager to go home.  Currently on aspirin 81, hemoptysis much improved.  Plan to follow-up with cardiology in 2 weeks as outpatient.   OBJECTIVE Temp:  [97.8 F (36.6 C)-99.8 F (37.7 C)] 97.8 F (36.6 C) (08/01 1100) Pulse Rate:  [55-111] 111 (08/01 1204) Cardiac Rhythm: Atrial fibrillation (08/01 0700) Resp:  [18-30] 30 (08/01 1204) BP: (88-105)/(59-72) 105/71 (08/01 1204) SpO2:  [72 %-98 %] 93 % (08/01 1224) Weight:  [216 lb 11.2 oz (98.3 kg)] 216 lb 11.2 oz (98.3 kg) (08/01 0405)  Recent Labs  Lab 10/11/17 1437 10/11/17 1752 10/11/17 2136 10/12/17 0806 10/12/17 1159  GLUCAP 153* 89 117* 99 92   Recent Labs  Lab 10/06/17 0728  10/08/17 0312 10/09/17 0411 10/10/17 0444 10/11/17 0354 10/12/17 0231  NA  --    < > 135 138 137 138 137  K  --    < > 3.2* 3.5 3.8 4.0 4.1  CL  --    < > 94* 98 100 100 102  CO2  --    < > 31 29 27 31 26   GLUCOSE  --    < > 134* 110* 86 93 163*  BUN  --    < > 19 21 17 18 20   CREATININE  --    < > 1.49* 1.49* 1.41* 1.34* 1.37*  CALCIUM  --    < > 8.6* 8.3* 8.7* 8.7* 8.6*  MG 1.8  --  2.2  --   --  2.2  --    < > = values in this interval not displayed.   Recent Labs  Lab 10/06/17 0326 10/11/17 0354  AST 27 24  ALT 24 29  ALKPHOS 61 64  BILITOT 1.0 0.7  PROT 6.9 7.7  ALBUMIN 2.2* 2.5*   Recent Labs  Lab 10/08/17 0312 10/09/17 0411 10/10/17 0444 10/11/17 0354 10/12/17 0231  WBC 8.0 6.7 7.5 7.8 8.2  HGB 9.8* 9.5* 9.7* 10.2* 10.2*  HCT 29.6* 29.1* 30.0* 31.0* 30.7*  MCV 89.2 90.1 90.9 90.6 89.8  PLT 247 253 286 313 331   No results for input(s): CKTOTAL, CKMB, CKMBINDEX, TROPONINI in the last 168 hours. No results for input(s): LABPROT, INR in the last 72 hours. No results for input(s): COLORURINE, LABSPEC,  [place], GLUCOSEU, HGBUR, BILIRUBINUR, KETONESUR, PROTEINUR, UROBILINOGEN, NITRITE, LEUKOCYTESUR in the last 72 hours.  Invalid input(s): APPERANCEUR     Component Value Date/Time   CHOL 155 02/07/2017 0904   TRIG 104 10/03/2017 2347   HDL 36 (L) 02/07/2017 0904   CHOLHDL 4.3 02/07/2017 0904   LDLCALC 94 02/07/2017 0904   Lab Results  Component Value Date   HGBA1C 6.9 (H) 10/07/2017   No results found for: LABOPIA, COCAINSCRNUR, LABBENZ, AMPHETMU, THCU, LABBARB  No results for input(s): ETH in the last 168 hours.  I have personally reviewed the radiological images below and agree with the radiology interpretations.  Mr Jodene Nam Head Wo Contrast  Result Date: 10/11/2017 CLINICAL DATA:  Stroke followup EXAM: MRA HEAD WITHOUT CONTRAST TECHNIQUE: Angiographic images of the Circle of Willis were obtained using MRA technique without intravenous contrast. COMPARISON:  None. FINDINGS: Both vertebral arteries patent to the basilar. PICA not visualized. Large AICA bilaterally may supply the PICA territory. Basilar widely patent. Superior cerebellar and  posterior cerebral arteries widely patent bilaterally. Internal carotid artery widely patent bilaterally. Anterior and middle cerebral arteries widely patent bilaterally. No significant stenosis. Negative for aneurysm. IMPRESSION: Negative Electronically Signed   By: Franchot Gallo M.D.   On: 10/11/2017 16:34   Mr Brain Wo Contrast  Result Date: 10/10/2017 CLINICAL DATA:  Metastatic lung cancer. EXAM: MRI HEAD WITHOUT CONTRAST TECHNIQUE: Multiplanar, multiecho pulse sequences of the brain and surrounding structures were obtained without intravenous contrast. COMPARISON:  None. FINDINGS: BRAIN: There is a subcentimeter focus of abnormal diffusion restriction in the left cerebellar hemisphere. No other diffusion abnormality. Minimal adjacent edema. The midline structures are normal. There are no old infarcts. The white matter signal is normal for the  patient's age. The CSF spaces are normal for age, with no hydrocephalus. Susceptibility-sensitive sequences show no chronic microhemorrhage or superficial siderosis. VASCULAR: Major intracranial arterial and venous sinus flow voids are preserved. SKULL AND UPPER CERVICAL SPINE: The visualized skull base, calvarium, upper cervical spine and extracranial soft tissues are normal. SINUSES/ORBITS: No fluid levels or advanced mucosal thickening. No mastoid or middle ear effusion. The orbits are normal. IMPRESSION: 1. Single subcentimeter focus of diffusion restriction in the left cerebellum is favored to be an acute ischemic infarct, given the lack of surrounding edema. A small metastasis could also have this appearance, but perilesional edema is typically much greater. If there are no contraindications, contrast administration might be helpful. 2. Otherwise normal brain for age. Electronically Signed   By: Ulyses Jarred M.D.   On: 10/10/2017 18:48   Mr Brain W Contrast  Result Date: 10/11/2017 CLINICAL DATA:  Stroke follow-up EXAM: MRI HEAD WITH CONTRAST TECHNIQUE: Multiplanar, multiecho pulse sequences of the brain and surrounding structures were obtained with intravenous contrast. CONTRAST:  66mL MULTIHANCE GADOBENATE DIMEGLUMINE 529 MG/ML IV SOLN COMPARISON:  Unenhanced MRI 10/10/2017 FINDINGS: Small acute infarct left cerebellum noted on the prior MRI. This does not show abnormal enhancement. No enhancing brain lesions identified. Leptomeningeal enhancement normal. Ventricle size and cerebral volume normal. IMPRESSION: Normal enhancement postcontrast administration. Electronically Signed   By: Franchot Gallo M.D.   On: 10/11/2017 16:15   Carotid Doppler  Bilateral: 1-39% ICA stenosis. Vertebral artery flow is antegrade.   TTE  - Left ventricle: The cavity size was moderately dilated. Wall   thickness was normal. There was adverse spherical remodeling. The   estimated ejection fraction was in the range of  10% to 15%.   Severe diffuse hypokinesis with no identifiable regional   variations. - Mitral valve: There was mild to moderate regurgitation directed   posteriorly. - Left atrium: The atrium was moderately dilated. - Right ventricle: The cavity size was severely dilated. Systolic   function was severely reduced. - Right atrium: The atrium was moderately dilated. - Pericardium, extracardiac: A small pericardial effusion was   identified along the right atrial free wall. There was no   evidence of hemodynamic compromise.  TEE - Left ventricle: Moderately dilated with mild LVH. The estimated   ejection fraction was 15%. Diffuse hypokinesis. - Mitral valve: Dilated annulus. Moderate regurgitation. - Left atrium: The atrium was severely dilated. Smoke was noted. No   evidence of thrombus in the atrial cavity or appendage. - Right atrium: The atrium was dilated. - Atrial septum: No defect or patent foramen ovale was identified. - Pericardium, extracardiac: A trivial pericardial effusion was   identified. Impressions: - No LAA thrombus. Unsuccessful DCCV.  PHYSICAL EXAM  Temp:  [97.8 F (36.6 C)-99.8 F (37.7 C)]  97.8 F (36.6 C) (08/01 1100) Pulse Rate:  [55-111] 111 (08/01 1204) Resp:  [18-30] 30 (08/01 1204) BP: (88-105)/(59-72) 105/71 (08/01 1204) SpO2:  [72 %-98 %] 93 % (08/01 1224) Weight:  [216 lb 11.2 oz (98.3 kg)] 216 lb 11.2 oz (98.3 kg) (08/01 0405)  General - Well nourished, well developed, in no apparent distress.  Ophthalmologic - fundi not visualized due to noncooperation.  Cardiovascular - irregularly irregular heart rate and rhythm.  Mental Status -  Level of arousal and orientation to time, place, and person were intact. Language including expression, naming, repetition, comprehension was assessed and found intact. Fund of Knowledge was assessed and was intact.  Cranial Nerves II - XII - II - Visual field intact OU. III, IV, VI - Extraocular movements  intact. V - Facial sensation intact bilaterally. VII - Facial movement intact bilaterally. VIII - Hearing & vestibular intact bilaterally. X - Palate elevates symmetrically. XI - Chin turning & shoulder shrug intact bilaterally. XII - Tongue protrusion intact.  Motor Strength - The patient's strength was normal in all extremities and pronator drift was absent.  Bulk was normal and fasciculations were absent.   Motor Tone - Muscle tone was assessed at the neck and appendages and was normal.  Reflexes - The patient's reflexes were symmetrical in all extremities and he had no pathological reflexes.  Sensory - Light touch, temperature/pinprick were assessed and were symmetrical.    Coordination - The patient had normal movements in the hands and feet with no ataxia or dysmetria.  Tremor was absent.  Gait and Station - deferred.   ASSESSMENT/PLAN Mr. Logan [name] is a 67 y.o. male with history of CHF, HTN, OSA on CPAP, PAF off Xarelto due to hemoptysis, prostate cancer, and metastatic lung cancer admitted for CHF exacerbation and A. fib RVR.    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  Resultant no neurological focal deficit  MRI punctate to small left cerebellar infarct  MRA unremarkable  Carotid Doppler unremarkable  2D Echo 10 to 15%, down from previous 25 to 30%  LDL 94  HgbA1c 5.9  SCDs for VTE prophylaxis  Xarelto (rivaroxaban) daily prior to admission (off due to hemoptysis), now on aspirin 81 mg daily.  Ideally, patient should be on anticoagulation however due to severe hemoptysis, hematuria and anemia, patient not anticoagulated candidate at this time.  Agree with cardiology plan to continue aspirin 81 and then follow-up  in 2 weeks as outpatient.  Patient counseled to be compliant with his  antithrombotic medications  Ongoing aggressive stroke risk factor management  Therapy recommendations: None  Disposition: Home  PAF  Was on Xarelto but off due to hemoptysis  Still has hemoptysis during admission  Currently on aspirin 81, hemoptysis much better  Continue aspirin 81 and follow-up with cardiology in 2 weeks to decide on antiplatelet versus anticoagulation  On amiodarone and digoxin  Rate controlled  Cardiomyopathy  TTE/TEE this admission showed decreased ejection fraction to 10 to 15%  Previously 25 to 30%  Cardiology on board  Currently on digoxin, Lasix, spironolactone and Cozaar  Follow-up with cardiology closely as outpatient  Metastatic Adenocarcinoma of the Lung; large right infrahilar mass; liver lesion.   Possible hypercoagulable state with advanced cancer  follow up with Dr Burr Medico for further evaluation and treatment.    Hemoptysis improved with aspirin 81  Consider anticoagulation whenever  safe from cardiology, hematology and oncology standpoint  Hyperlipidemia  Home meds: None  LDL 94, goal < 70  Hold off statin at discharge due to likely liver metastasis  Other Stroke Risk Factors  Advanced age  Former smoker, quit 43 years ago  Obstructive sleep apnea, on CPAP at home  Other Active Problems  Hemoptysis improved  Anemia of chronic disease  Hypokalemia, resolved  CKD, stage II  Hospital day # 16  Neurology will sign off. Please call with questions. Pt will follow up with stroke clinic NP at [facility] in about 4 weeks. Thanks for the consult.   Rosalin Hawking, MD PhD Stroke Neurology 10/12/2017 2:04 PM    To contact Stroke Continuity provider, please refer to [email or web address] After hours, contact General Neurology

## 2017-10-12 NOTE — Discharge Summary (Signed)
Physician Discharge Summary  KIRON OSMUN TKZ:[id number] DOB: 1950-11-24 DOA: 09/26/2017  PCP: [name], MD  Admit date: 09/26/2017 Discharge date: 10/12/2017  Admitted From: Home.  Disposition:    Recommendations for Outpatient Follow-up:  1. Follow up with PCP in 1-2 weeks 2. Please obtain BMP/CBC in one week 3. Needs to follow up with cardiology, for further anticoagulation consideration.  4. Also need to follow up with Dr Burr Medico for further treatment option of lung cancer.   Home Health; None  Discharge Condition: stable.  CODE STATUS: full code.  Diet recommendation: Heart Healthy   Brief/Interim Summary: Brief Narrative:  67yo M with hx CHF (EF 25-30%), HTN, Afib (on ASA as outpatient, had hemoptysis when on Xarelto), OSA on CPAP, Prostate cancer, and hypermetabolic RLL mass with left adrenal nodule (diagnosed on PET scan 03/2015)who was admitted withaCHF exacerbation and Afib with RVR7/16/19. Since then he has undergone thoracentesis 7/17 and liver biopsy 7/22. Patient has been hypotensive which has limited ability to diurese him.Amiodarone infusion was stopped as there was concern it was causing hypotension.Hewent into Afib with RVR anddeveloped flash pulmonary edema, ultimately requiring intubation. He was transferred to ICU. HeartFailureTeam had been following and started milrinone and levophed infusions, as well as a lasix infusion. By 7/26 he had improved and was able to come off milrinone and the vent. [place] becamecomplicated by hemoptysis and hematuria.   Assessment & Plan:   Active Problems:   Acute systolic heart failure (HCC)   Atrial fibrillation with RVR (HCC)   Acute on chronic combined systolic and diastolic congestive heart failure (HCC)   Hepatic lesion   Flash pulmonary edema (HCC)   Non-small cell lung cancer (NSCLC) (HCC)   Acute respiratory failure (HCC)  Acute Hypoxic and hypercapnic   Respiratory Failure;  flash pulmonary  edema. Resolved.   Acute stroke , cerebellum  Neurology consulted.  Carotid doppler; 1-39 % stenosis.  Discussed with patient. He agrees to be back on aspirin 81 mg, he did fine before. He has only cough 2-3 times today, very small amount. Will monitor on low dose aspirin.  He will follow with cardiology to consider another trial of eliquis.  Appreciate Neurology evaluation.   Metastatic Adenocarcinoma of the Lung; large right infrahilar mass; liver lesion.  MRI done to evaluate for mets.  Patient needs to follow up with Dr Burr Medico for further evaluation and treatment. Awaiting molecular testing to determine if immunotherapy is an option.  No significant hemoptysis.   Right pleural effusion;  s/p thoracentesis 7/17 with cytology showing reactive mesothelial cells- stable on f/u CXR.   A fib RVR;  Per cardiology On amiodarone. Digoxin  Not on anticoagulation due to episodes of massive hemoptysis.   Acute systolic CHF exacerbation;  Echo now shows EF 10-15% from baseline of 25%- Per HF team.  On oral lasix, cozaar, spironolactone. .   AKI;  Improved.   Normocytic anemia;  Follow hb trend.   H flu PNA; Recieved ceftriaxone for 7 days.   Newly diagnosed DM;  noprior hx of DMbut A1c this admit meets criteria at 6.9 diet for now.       Discharge Diagnoses:  Active Problems:   Acute systolic heart failure (HCC)   Atrial fibrillation with RVR (HCC)   Acute on chronic combined systolic and diastolic congestive heart failure (HCC)   Hepatic lesion   Flash pulmonary edema (HCC)   Non-small cell lung cancer (NSCLC) (HCC)   Acute respiratory failure (HCC)   Cerebral thrombosis with  cerebral infarction    Discharge Instructions  Discharge Instructions    Diet - low sodium heart healthy   Complete by:  As directed    Increase activity slowly   Complete by:  As directed      Allergies as of 10/12/2017   No Active Allergies     Medication List    STOP  taking these medications   carvedilol 12.5 MG tablet Commonly known as:  COREG   ENTRESTO 24-26 MG Generic drug:  sacubitril-valsartan     TAKE these medications   amiodarone 400 MG tablet Commonly known as:  PACERONE Take 1 tablet (400 mg total) by mouth 2 (two) times daily.   aspirin 81 MG tablet Take 81 mg by mouth daily.   [employer] VIT D 5000 HIGH-POTENCY 5000 units capsule Generic drug:  Cholecalciferol Take 5,000 Units by mouth daily.   digoxin 0.125 MG tablet Commonly known as:  LANOXIN Take 1 tablet (0.125 mg total) by mouth daily. Start taking on:  10/13/2017   furosemide 40 MG tablet Commonly known as:  LASIX Take 1 tablet (40 mg total) by mouth every evening. What changed:  You were already taking a medication with the same name, and this prescription was added. Make sure you understand how and when to take each.   furosemide 80 MG tablet Commonly known as:  LASIX Take 1 tablet (80 mg total) by mouth daily with breakfast. Start taking on:  10/13/2017 What changed:    medication strength  how much to take  when to take this   losartan 25 MG tablet Commonly known as:  COZAAR Take 1 tablet (25 mg total) by mouth every evening.   multivitamin tablet Take 1 tablet by mouth daily.   pantoprazole 40 MG tablet Commonly known as:  PROTONIX Take 1 tablet (40 mg total) by mouth daily. Start taking on:  10/13/2017   potassium chloride SA 20 MEQ tablet Commonly known as:  K-DUR,KLOR-CON Take 2 tablets (40 mEq total) by mouth daily. Start taking on:  10/13/2017   spironolactone 25 MG tablet Commonly known as:  ALDACTONE Take 1 tablet (25 mg total) by mouth daily. Start taking on:  10/13/2017   traZODone 50 MG tablet Commonly known as:  DESYREL Take 1 tablet (50 mg total) by mouth at bedtime as needed for sleep.            Durable Medical Equipment  (From admission, onward)        Start     Ordered   10/12/17 1213  For home use only DME oxygen  Once     Question Answer Comment  Mode or (Route) Nasal cannula   Liters per Minute 2   Frequency Continuous (stationary and portable oxygen unit needed)   Oxygen delivery system Gas      10/12/17 1213     Follow-up Information    [facility] Follow up on 10/23/2017.   Specialty:  Cardiology Why:  Heart Failure Followup-10:30-Park/Dropoff at ER lot (Enter under blue "Specialty Clinics" awning) or under [address] on [address] ([employer], [place] Code: 1500, elevator 1st floor) Take all am meds, bring all med bottles. Contact information: [address] [id number] [place] [phone]       Truitt Merle, MD Follow up in 1 week(s).   Specialties:  Hematology, Oncology Contact information: [address] [place] [address] [phone]  No Active Allergies  Consultations:  Cardiology  Neurology    Procedures/Studies: Ct Abdomen Pelvis W Wo Contrast  Result Date: 09/27/2017 CLINICAL DATA:  Prostate cancer.  Lung mass.  Staging assessment EXAM: CT ABDOMEN AND PELVIS WITHOUT AND WITH CONTRAST TECHNIQUE: Multidetector CT imaging of the abdomen and pelvis was performed following the standard protocol before and following the bolus administration of intravenous contrast. CONTRAST:  118mL OMNIPAQUE IOHEXOL 300 MG/ML  SOLN COMPARISON:  CT abdomen report from 03/26/2012. CT chest from 09/26/2017 FINDINGS: Lower chest: Approximately 10.2 by 9.0 cm right lower lobe mass with some central calcifications and internal heterogeneity, and surrounding consolidation in the right lower lobe as shown on recent chest CT. Trace bilateral pleural effusions. Suspected right lower paraesophageal adenopathy is only partially included on today's exam on image 1/8. There is a moderate degree of atelectasis in the right middle lobe mild atelectasis in the left lower lobe. Moderate cardiomegaly. Mild  prominence of inferior pericardial fluid. Hepatobiliary: Heterogeneous 6.1 by 5.2 cm right hepatic lobe mass on image 35/8 has a targetoid appearance. Rim enhancing 2.6 by 2.2 cm lesion in segment 4b of the liver on image 42/8 likewise favoring malignancy. Equivocal accentuated density in segment 4a of the liver on image 22/8 measuring 1.1 cm in diameter, possibly a third lesion. Pancreas: Unremarkable Spleen: Nonspecific hypodense 2.0 by 1.6 cm lesion anteriorly in the spleen on image 22/8. Adrenals/Urinary Tract: The adrenal glands appear normal. Indistinct somewhat triangular 1.6 by 1.2 cm hypodensity in the right mid kidney laterally on image 22/13 is nonspecific. 0.8 cm hypodensity in the left kidney upper pole parenchyma on image 11/13, nonspecific. Indistinct 1.2 by 1.1 cm hypodensity in the left mid kidney anteriorly on image 20/13. These do not have the typical sharply defined appearance of cysts. Urinary bladder unremarkable. No urinary tract calculi are identified. Stomach/Bowel: Unremarkable Vascular/Lymphatic: Minimal atherosclerotic calcification the left common iliac artery. No pathologic adenopathy. Reproductive: The prostate gland measures 4.9 by 4.6 by 6.1 cm (volume = 72 cm^3). Other: Small amount of localized perihepatic ascites adjacent to the right hepatic lobe and near the right hepatic lobe lesion. Small amount of upper pelvic ascites and presacral edema. Subcutaneous edema along the flanks and lower anterior abdominal wall. Musculoskeletal: Degenerative facet arthropathy at L4-5 and L5-S1 with grade 1 degenerative anterolisthesis at L4-5. IMPRESSION: 1. Large complex masses in the right lower lobe of the lung and in the right hepatic lobe. Smaller liver lesions in the left hepatic lobe. Nonspecific lesions in the spleen and kidneys. No significant pathologic adenopathy. This would not be a characteristic metastatic pattern for prostate cancer and is not a specific metastatic and imaging  pattern to suggest an individual etiology. Possibilities might include metastatic lung cancer, melanoma, metastatic hepatocellular carcinoma, or a variety of other possibilities. Tissue diagnosis recommended. 2. Moderate prostatomegaly. 3. Scattered mild ascites. 4. Moderate cardiomegaly.  Small inferior pericardial effusion. 5. Trace bilateral pleural effusions. Electronically Signed   By: Van Clines M.D.   On: 09/27/2017 23:00   Dg Chest 1 View  Result Date: 09/27/2017 CLINICAL DATA:  Status post right thoracentesis today. EXAM: CHEST  1 VIEW COMPARISON:  Single-view of the chest earlier today. CT chest 09/26/2017. FINDINGS: Right pleural effusion is decreased after thoracentesis. No pneumothorax. No other change. IMPRESSION: Decreased right pleural effusion after thoracentesis. Negative for pneumothorax. Persistent right basilar opacity consistent with residual pleural fluid and pulmonary mass. Electronically Signed   By: Inge Rise M.D.   On: 09/27/2017 12:03  Dg Chest 2 View  Result Date: 09/27/2017 CLINICAL DATA:  Worsening shortness of breath and bilateral lower extremity swelling over the past several days. EXAM: CHEST - 2 VIEW COMPARISON:  CT chest 09/26/2017. FINDINGS: Right pleural effusion and dense opacity in the right lower chest consistent with mass seen on the prior CT is unchanged. A very small left pleural effusion and mild basilar atelectasis noted. Heart size is enlarged. No pneumothorax. No acute bony abnormality. IMPRESSION: No change in a right pleural effusion and right lower lobe opacity consistent with the mass lesion seen on prior CT. Cardiomegaly without edema. Tiny left pleural effusion and mild basilar atelectasis. Electronically Signed   By: Inge Rise M.D.   On: 09/27/2017 10:58   Ct Chest Wo Contrast  Result Date: 09/26/2017 CLINICAL DATA:  Dyspnea, peripheral edema, symptoms worsening over last week, lung nodule, lung mass EXAM: CT CHEST WITHOUT  CONTRAST TECHNIQUE: Multidetector CT imaging of the chest was performed following the standard protocol without IV contrast. COMPARISON:  None FINDINGS: Cardiovascular: No significant vascular findings. Normal heart size. No pericardial effusion. Mediastinum/Nodes: Prominent mediastinal lymph nodes are mildly enlarged. For example RIGHT lower paratracheal lymph node measuring 13 mm short axis. RIGHT hilar lymph node measuring 12 mm short axis. Lungs/Pleura: Consolidative mass in the RIGHT lower lobe with central and peripheral calcifications is poorly marginated. Mass measures approximately 8.5 by 10 by 9.0 cm and occupies the majority of the RIGHT lower lobe. There is there is airspace disease in the medial RIGHT lower lobe with air bronchograms. Small effusion. Within the RIGHT upper lobe 6 mm peripheral nodule image 62/4. Small LEFT effusion. Upper Abdomen: Limited view of the liver, kidneys, pancreas are unremarkable. Normal adrenal glands. Musculoskeletal: Bulky osteophytes of the spine. No aggressive osseous lesion. IMPRESSION: 1. Large RIGHT lower lobe mass with central and peripheral calcifications occupies the near entirety of the RIGHT lower lobe and concerning for neoplasm. 2. Moderate RIGHT effusion. 3. Mild mediastinal and RIGHT hilar adenopathy. 4. Recommend FDG PET scan for biopsy planning/staging These results will be called to the ordering clinician or representative by the Radiologist Assistant, and communication documented in the PACS or zVision Dashboard. Electronically Signed   By: Suzy Bouchard M.D.   On: 09/26/2017 10:43   Mr Jodene Nam Head Wo Contrast  Result Date: 10/11/2017 CLINICAL DATA:  Stroke followup EXAM: MRA HEAD WITHOUT CONTRAST TECHNIQUE: Angiographic images of the Circle of Willis were obtained using MRA technique without intravenous contrast. COMPARISON:  None. FINDINGS: Both vertebral arteries patent to the basilar. PICA not visualized. Large AICA bilaterally may supply the  PICA territory. Basilar widely patent. Superior cerebellar and posterior cerebral arteries widely patent bilaterally. Internal carotid artery widely patent bilaterally. Anterior and middle cerebral arteries widely patent bilaterally. No significant stenosis. Negative for aneurysm. IMPRESSION: Negative Electronically Signed   By: Franchot Gallo M.D.   On: 10/11/2017 16:34   Mr Brain Wo Contrast  Result Date: 10/10/2017 CLINICAL DATA:  Metastatic lung cancer. EXAM: MRI HEAD WITHOUT CONTRAST TECHNIQUE: Multiplanar, multiecho pulse sequences of the brain and surrounding structures were obtained without intravenous contrast. COMPARISON:  None. FINDINGS: BRAIN: There is a subcentimeter focus of abnormal diffusion restriction in the left cerebellar hemisphere. No other diffusion abnormality. Minimal adjacent edema. The midline structures are normal. There are no old infarcts. The white matter signal is normal for the patient's age. The CSF spaces are normal for age, with no hydrocephalus. Susceptibility-sensitive sequences show no chronic microhemorrhage or superficial siderosis. VASCULAR: Major intracranial  arterial and venous sinus flow voids are preserved. SKULL AND UPPER CERVICAL SPINE: The visualized skull base, calvarium, upper cervical spine and extracranial soft tissues are normal. SINUSES/ORBITS: No fluid levels or advanced mucosal thickening. No mastoid or middle ear effusion. The orbits are normal. IMPRESSION: 1. Single subcentimeter focus of diffusion restriction in the left cerebellum is favored to be an acute ischemic infarct, given the lack of surrounding edema. A small metastasis could also have this appearance, but perilesional edema is typically much greater. If there are no contraindications, contrast administration might be helpful. 2. Otherwise normal brain for age. Electronically Signed   By: Ulyses Jarred M.D.   On: 10/10/2017 18:48   Mr Brain W Contrast  Result Date: 10/11/2017 CLINICAL  DATA:  Stroke follow-up EXAM: MRI HEAD WITH CONTRAST TECHNIQUE: Multiplanar, multiecho pulse sequences of the brain and surrounding structures were obtained with intravenous contrast. CONTRAST:  36mL MULTIHANCE GADOBENATE DIMEGLUMINE 529 MG/ML IV SOLN COMPARISON:  Unenhanced MRI 10/10/2017 FINDINGS: Small acute infarct left cerebellum noted on the prior MRI. This does not show abnormal enhancement. No enhancing brain lesions identified. Leptomeningeal enhancement normal. Ventricle size and cerebral volume normal. IMPRESSION: Normal enhancement postcontrast administration. Electronically Signed   By: Franchot Gallo M.D.   On: 10/11/2017 16:15   US Biopsy (liver)  Result Date: 10/02/2017 INDICATION: 67 year old with a suspicious right lung mass and indeterminate liver lesions. Tissue diagnosis is needed. EXAM: ULTRASOUND-GUIDED CORE BIOPSY OF LIVER LESION MEDICATIONS: None. ANESTHESIA/SEDATION: Moderate (conscious) sedation was employed during this procedure. A total of Versed 1 mg and Fentanyl 50 mcg was administered intravenously. Moderate Sedation Time: 14 minutes. The patient's level of consciousness and vital signs were monitored continuously by radiology nursing throughout the procedure under my direct supervision. FLUOROSCOPY TIME:  None COMPLICATIONS: None immediate. PROCEDURE: Informed written consent was obtained from the patient after a thorough discussion of the procedural risks, benefits and alternatives. All questions were addressed. A timeout was performed prior to the initiation of the procedure. The liver was evaluated with ultrasound. The lesion in the medial left hepatic lobe, segment 4B,was targeted for biopsy. The anterior abdomen was prepped chlorhexidine and sterile field was created. Skin and soft tissues were anesthetized with 1% lidocaine. 17 gauge coaxial needle was directed into the lesion with ultrasound guidance. A total of 4 core biopsies were obtained with an 18 gauge core device.  Specimens placed in formalin. Gel-Foam slurry was injected through the 17 gauge coaxial needle as it was removed. Bandage placed over the puncture site. FINDINGS: 2.5 cm lesion in the medial left hepatic lobe with a peripheral hypoechoic rim. Patient has a larger lesion in the right hepatic lobe but this is poorly marginated and more difficult to accurately biopsy. Medial left hepatic lobe lesion was successfully targeted and biopsied. No significant bleeding or hematoma formation following the core biopsies. IMPRESSION: Successful ultrasound-guided liver lesion biopsy. Electronically Signed   By: Markus Daft M.D.   On: 10/02/2017 16:05   Dg Chest Port 1 View  Result Date: 10/10/2017 CLINICAL DATA:  67 year old with shortness of breath and cough. Subsequent encounter. EXAM: PORTABLE CHEST 1 VIEW COMPARISON:  10/05/2017 chest x-ray. FINDINGS: Endotracheal tube and nasogastric tube removed. Right PICC line tip mid to distal superior vena cava level. Asymmetric airspace disease consistent with pulmonary edema and right-sided pleural effusion. In this setting, cannot exclude right lower lobe infiltrate. Cardiomegaly. No obvious pneumothorax. IMPRESSION: Endotracheal tube and nasogastric tube removed. Pulmonary edema with right-sided pleural effusion and cardiomegaly without significant  change from prior exam. Electronically Signed   By: Genia Del M.D.   On: 10/10/2017 09:27   Dg Chest Port 1 View  Result Date: 10/05/2017 CLINICAL DATA:  Acute respiratory failure EXAM: PORTABLE CHEST 1 VIEW COMPARISON:  10/04/2016, 10/03/2017, 09/27/2017, CT chest 09/26/2017 FINDINGS: Endotracheal tube tip is about 2.6 cm superior to the carina. Right upper extremity catheter tip overlies the SVC. Esophageal tube tip is below the diaphragm but not well seen. Cardiomegaly with vascular congestion and mild edema. Small moderate right pleural effusion and small left pleural effusion. Similar appearance of dense airspace disease  at the right lung base. IMPRESSION: 1. Endotracheal tube tip about 2.6 cm superior to carina. 2. Continued cardiomegaly with vascular congestion and pulmonary edema. 3. Small moderate right pleural effusion and small left pleural effusion. Continued dense airspace disease at the right middle lobe and right base. Electronically Signed   By: Donavan Foil M.D.   On: 10/05/2017 20:37   Dg Chest Port 1 View  Result Date: 10/04/2017 CLINICAL DATA:  Respiratory failure EXAM: PORTABLE CHEST 1 VIEW COMPARISON:  10/03/2017 FINDINGS: NG tube and endotracheal tube are unchanged. Cardiomegaly. Bilateral airspace opacities are again noted, right greater than left, most confluent in the right lung base. Mild vascular congestion. IMPRESSION: Stable cardiomegaly and vascular congestion. Stable bilateral airspace opacities, likely edema. More confluent infiltrate in the right lung base, cannot exclude pneumonia. Electronically Signed   By: Rolm Baptise M.D.   On: 10/04/2017 07:18   Dg Chest Port 1 View  Result Date: 10/03/2017 CLINICAL DATA:  Respiratory failure EXAM: PORTABLE CHEST 1 VIEW COMPARISON:  09/27/2017, 09/26/2017 FINDINGS: Endotracheal tube tip is about 5 cm superior to the carina. Esophageal tube tip is below the diaphragm, tip is in the left upper quadrant. Cardiomegaly with vascular congestion and pulmonary edema. Confluent airspace disease at the right greater than left lung bases. IMPRESSION: 1. Endotracheal tube tip about 5 cm superior to the carina. 2. Cardiomegaly with vascular congestion and pulmonary edema. 3. Right greater than left basilar confluent airspace disease, on the right, this corresponds to previously noted lung mass and pleural fluid Electronically Signed   By: Donavan Foil M.D.   On: 10/03/2017 23:34   [place] Ekg Site Rite  Result Date: 10/04/2017 If Site Rite image not attached, placement could not be confirmed due to current cardiac rhythm.  Ir Thoracentesis Asp Pleural Space W/img  Guide  Result Date: 09/27/2017 INDICATION: Symptomatic right pleural effusion - request for diagnostic thoracentesis EXAM: ULTRASOUND GUIDED RIGHT THORACENTESIS MEDICATIONS: 10 mL 2 % Lidocaine. COMPLICATIONS: None immediate. PROCEDURE: An ultrasound guided thoracentesis was thoroughly discussed with the patient and questions answered. The benefits, risks, alternatives and complications were also discussed. The patient understands and wishes to proceed with the procedure. Written consent was obtained. Ultrasound was performed to localize and mark an adequate pocket of fluid in the right chest. The area was then prepped and draped in the normal sterile fashion. 2% Lidocaine was used for local anesthesia. Under ultrasound guidance a 6 Fr Safe-T-Centesis catheter was introduced. Thoracentesis was performed. The catheter was removed and a dressing applied. FINDINGS: A total of approximately 700 mL of golden yellow fluid was removed. Samples were sent to the laboratory as requested by the clinical team. IMPRESSION: Successful ultrasound guided right thoracentesis yielding 700 mL of pleural fluid. Read by Candiss Norse, PA-C Electronically Signed   By: Jacqulynn Cadet M.D.   On: 09/27/2017 11:27      Subjective: Feeling well,  denies coughing blood.  Denies dyspnea.    Discharge Exam: Vitals:   10/12/17 1207 10/12/17 1224  BP:    Pulse:    Resp:    Temp:    SpO2: (!) 72% 93%   Vitals:   10/12/17 1100 10/12/17 1204 10/12/17 1207 10/12/17 1224  BP: (!) 88/69 105/71    Pulse: 99 (!) 111    Resp: 18 (!) 30    Temp: 97.8 F (36.6 C)     TempSrc: Oral     SpO2: 98% 98% (!) 72% 93%  Weight:      Height:        General: Pt is alert, awake, not in acute distress Cardiovascular: RRR, S1/S2 +, no rubs, no gallops Respiratory: CTA bilaterally, no wheezing, no rhonchi Abdominal: Soft, NT, ND, bowel sounds + Extremities: no edema, no cyanosis    The results of significant diagnostics from  this hospitalization (including imaging, microbiology, ancillary and laboratory) are listed below for reference.     Microbiology: Recent Results (from the past 240 hour(s))  Culture, respiratory (tracheal aspirate)     Status: None   Collection Time: 10/03/17 12:11 AM  Result Value Ref Range Status   Specimen Description TRACHEAL ASPIRATE  Final   Special Requests NONE  Final   Gram Stain   Final    RARE WBC PRESENT, PREDOMINANTLY PMN NO ORGANISMS SEEN    Culture   Final    FEW HAEMOPHILUS INFLUENZAE BETA LACTAMASE NEGATIVE RESULT CALLED TO, READ BACK BY AND VERIFIED WITH: RN A BARLOW [id number] MLM Performed at [facility], [address] [place], [place] [address]    Report Status 10/06/2017 FINAL  Final  MRSA PCR Screening     Status: None   Collection Time: 10/04/17  4:32 AM  Result Value Ref Range Status   MRSA by PCR NEGATIVE NEGATIVE Final    Comment:        The GeneXpert MRSA Assay (FDA approved for NASAL specimens only), is one component of a comprehensive MRSA colonization surveillance program. It is not intended to diagnose MRSA infection nor to guide or monitor treatment for MRSA infections. Performed at [facility], [place] [address], [place], [place] [address]      Labs: BNP (last 3 results) Recent Labs    09/26/17 0955 10/03/17 2347  BNP 537.4* 212.2*   Basic Metabolic Panel: Recent Labs  Lab 10/06/17 0728  10/08/17 0312 10/09/17 0411 10/10/17 0444 10/11/17 0354 10/12/17 0231  NA  --    < > 135 138 137 138 137  K  --    < > 3.2* 3.5 3.8 4.0 4.1  CL  --    < > 94* 98 100 100 102  CO2  --    < > 31 29 27 31 26   GLUCOSE  --    < > 134* 110* 86 93 163*  BUN  --    < > 19 21 17 18 20   CREATININE  --    < > 1.49* 1.49* 1.41* 1.34* 1.37*  CALCIUM  --    < > 8.6* 8.3* 8.7* 8.7* 8.6*  MG 1.8  --  2.2  --   --  2.2  --    < > = values in this interval not displayed.   Liver Function Tests: Recent Labs  Lab 10/06/17 0326  10/11/17 0354  AST 27 24  ALT 24 29  ALKPHOS 61 64  BILITOT 1.0 0.7  PROT 6.9 7.7  ALBUMIN 2.2* 2.5*   No results for input(s): LIPASE, AMYLASE in the last 168 hours. No results for input(s): AMMONIA in the last 168 hours. CBC: Recent Labs  Lab 10/08/17 0312 10/09/17 0411 10/10/17 0444 10/11/17 0354 10/12/17 0231  WBC 8.0 6.7 7.5 7.8 8.2  HGB 9.8* 9.5* 9.7* 10.2* 10.2*  HCT 29.6* 29.1* 30.0* 31.0* 30.7*  MCV 89.2 90.1 90.9 90.6 89.8  PLT 247 253 286 313 331   Cardiac Enzymes: No results for input(s): CKTOTAL, CKMB, CKMBINDEX, TROPONINI in the last 168 hours. BNP: Invalid input(s): POCBNP CBG: Recent Labs  Lab 10/11/17 1437 10/11/17 1752 10/11/17 2136 10/12/17 0806 10/12/17 1159  GLUCAP 153* 89 117* 99 92   D-Dimer No results for input(s): DDIMER in the last 72 hours. Hgb A1c No results for input(s): HGBA1C in the last 72 hours. Lipid Profile No results for input(s): CHOL, HDL, LDLCALC, TRIG, CHOLHDL, LDLDIRECT in the last 72 hours. Thyroid function studies No results for input(s): TSH, T4TOTAL, T3FREE, THYROIDAB in the last 72 hours.  Invalid input(s): FREET3 Anemia work up No results for input(s): VITAMINB12, FOLATE, FERRITIN, TIBC, IRON, RETICCTPCT in the last 72 hours. Urinalysis    Component Value Date/Time   COLORURINE AMBER (A) 10/04/2017 0132   APPEARANCEUR CLOUDY (A) 10/04/2017 0132   LABSPEC 1.016 10/04/2017 0132   PHURINE 6.0 10/04/2017 0132   GLUCOSEU 50 (A) 10/04/2017 0132   HGBUR LARGE (A) 10/04/2017 0132   BILIRUBINUR NEGATIVE 10/04/2017 0132   KETONESUR NEGATIVE 10/04/2017 0132   PROTEINUR 100 (A) 10/04/2017 0132   NITRITE NEGATIVE 10/04/2017 0132   LEUKOCYTESUR NEGATIVE 10/04/2017 0132   Sepsis Labs Invalid input(s): PROCALCITONIN,  WBC,  LACTICIDVEN Microbiology Recent Results (from the past 240 hour(s))  Culture, respiratory (tracheal aspirate)     Status: None   Collection Time: 10/03/17 12:11 AM  Result Value Ref Range Status    Specimen Description TRACHEAL ASPIRATE  Final   Special Requests NONE  Final   Gram Stain   Final    RARE WBC PRESENT, PREDOMINANTLY PMN NO ORGANISMS SEEN    Culture   Final    FEW HAEMOPHILUS INFLUENZAE BETA LACTAMASE NEGATIVE RESULT CALLED TO, READ BACK BY AND VERIFIED WITH: RN A BARLOW [id number] Performed at [facility], [place] [address], [place], [place] [address]    Report Status 10/06/2017 FINAL  Final  MRSA PCR Screening     Status: None   Collection Time: 10/04/17  4:32 AM  Result Value Ref Range Status   MRSA by PCR NEGATIVE NEGATIVE Final    Comment:        The GeneXpert MRSA Assay (FDA approved for NASAL specimens only), is one component of a comprehensive MRSA colonization surveillance program. It is not intended to diagnose MRSA infection nor to guide or monitor treatment for MRSA infections. Performed at [facility], [place] [address], [place], [place] [address]      Time coordinating discharge: 35 minutes,   SIGNED:   Elmarie Shiley, MD  [facility] 10/12/2017, 1:54 PM Pager   If 7PM-7AM, please contact night-coverage [email or web address] Password [email or web address]

## 2017-10-12 NOTE — Plan of Care (Signed)
  Problem: Education: Goal: Knowledge of General Education information will improve Outcome: Progressing   Problem: Education: Goal: Knowledge of General Education information will improve Outcome: Progressing   Problem: Health Behavior/Discharge Planning: Goal: Ability to manage health-related needs will improve Outcome: Progressing   Problem: Clinical Measurements: Goal: Ability to maintain clinical measurements within normal limits will improve Outcome: Progressing Goal: Will remain free from infection Outcome: Progressing Goal: Diagnostic test results will improve Outcome: Progressing Goal: Respiratory complications will improve Outcome: Progressing Goal: Cardiovascular complication will be avoided Outcome: Progressing   Problem: Coping: Goal: Level of anxiety will decrease Outcome: Progressing   Problem: Safety: Goal: Ability to remain free from injury will improve Outcome: Progressing   Problem: Skin Integrity: Goal: Risk for impaired skin integrity will decrease Outcome: Progressing   Problem: Education: Goal: Knowledge of disease or condition will improve Outcome: Progressing Goal: Understanding of medication regimen will improve Outcome: Progressing Goal: Individualized Educational Video(s) Outcome: Progressing   Problem: Activity: Goal: Ability to tolerate increased activity will improve Outcome: Progressing   Problem: Cardiac: Goal: Ability to achieve and maintain adequate cardiopulmonary perfusion will improve Outcome: Progressing   Problem: Health Behavior/Discharge Planning: Goal: Ability to safely manage health-related needs after discharge will improve Outcome: Progressing   Problem: Education: Goal: Ability to demonstrate management of disease process will improve Outcome: Progressing Goal: Ability to verbalize understanding of medication therapies will improve Outcome: Progressing Goal: Individualized Educational Video(s) Outcome:  Progressing   Problem: Activity: Goal: Capacity to carry out activities will improve Outcome: Progressing   Problem: Cardiac: Goal: Ability to achieve and maintain adequate cardiopulmonary perfusion will improve Outcome: Progressing   Problem: Respiratory: Goal: Ability to maintain a clear airway and adequate ventilation will improve Outcome: Progressing

## 2017-10-12 NOTE — Progress Notes (Signed)
SATURATION QUALIFICATIONS: (This note is used to comply with regulatory documentation for home oxygen)  Patient Saturations on Room Air at Rest = 98%  Patient Saturations on Room Air while Ambulating = 72%  Patient Saturations on 2 Liters of oxygen while Ambulating = 93%-95%  Please briefly explain why patient needs home oxygen:  When pt ambulates greater than 78ft he begins to quickly desaturate. Pt does not notice any change in breathing until he has ambulated a greater distance and oxygen  levels are at a critical low.

## 2017-10-12 NOTE — Progress Notes (Addendum)
Patient ID: Logan [name], male   DOB: [birth year], 67 y.o.   MRN: [id number]     Advanced Heart Failure Rounding Note  PCP-Cardiologist: Sinclair Grooms, MD   Subjective:    Extubated 7/26  Off NE and moved to [facility] 10/09/17. PICC removed 10/11/17 in anticipation of home.   Creatinine stable.   Feeling great this am. Denies SOB, lightheadedness or dizziness.  No CP.  HR controlled at rest. Up to 110s with activity, currrently in atrial fibrillation.   Carotid US 10/11/17 - Bilateral ICAs 1-39%.   MRI brain 10/11/17 - Without contrast -> single sub-centimeter focus of diffusion restriction in left cerebellum, favoring infarct.  - Normal enhancement with contrast. No concerns for mets.   MRA Head 10/11/17 - Negative.    Objective:   Weight Range: 216 lb 11.2 oz (98.3 kg) Body mass index is 30.22 kg/m.   Vital Signs:   Temp:  [97.5 F (36.4 C)-98.7 F (37.1 C)] 98.4 F (36.9 C) (08/01 0405) Pulse Rate:  [90-113] 91 (08/01 0405) Resp:  [20-28] 20 (08/01 0405) BP: (88-116)/(59-103) 105/59 (08/01 0405) SpO2:  [93 %-98 %] 94 % (08/01 0405) Weight:  [216 lb 11.2 oz (98.3 kg)] 216 lb 11.2 oz (98.3 kg) (08/01 0405) Last BM Date: 10/10/17  Weight change: Filed Weights   10/10/17 0500 10/11/17 0352 10/12/17 0405  Weight: 217 lb 13 oz (98.8 kg) 215 lb 6.2 oz (97.7 kg) 216 lb 11.2 oz (98.3 kg)    Intake/Output:   Intake/Output Summary (Last 24 hours) at 10/12/2017 0724 Last data filed at 10/11/2017 2200 Gross per 24 hour  Intake 927.5 ml  Output 625 ml  Net 302.5 ml    Physical Exam   General: NAD  HEENT: Normal Neck: Supple. JVP 5-6. Carotids 2+ bilat; no bruits. No thyromegaly or nodule noted. Cor: PMI lateral. Irregular, slightly tachy. No M/G/R noted Lungs: CTAB, normal effort. Abdomen: Soft, non-tender, non-distended, no HSM. No bruits or masses. +BS  Extremities: No cyanosis, clubbing, or rash. R and LLE no edema.  Neuro: Alert & orientedx3, cranial  nerves grossly intact. moves all 4 extremities w/o difficulty. Affect pleasant   Telemetry   In and out of Afib overnight, for the majority of the night appears to be have been in NSR 80-90s, personally reviewed.  Labs    CBC Recent Labs    10/11/17 0354 10/12/17 0231  WBC 7.8 8.2  HGB 10.2* 10.2*  HCT 31.0* 30.7*  MCV 90.6 89.8  PLT 313 884   Basic Metabolic Panel Recent Labs    10/11/17 0354 10/12/17 0231  NA 138 137  K 4.0 4.1  CL 100 102  CO2 31 26  GLUCOSE 93 163*  BUN 18 20  CREATININE 1.34* 1.37*  CALCIUM 8.7* 8.6*  MG 2.2  --    Liver Function Tests Recent Labs    10/11/17 0354  AST 24  ALT 29  ALKPHOS 64  BILITOT 0.7  PROT 7.7  ALBUMIN 2.5*   No results for input(s): LIPASE, AMYLASE in the last 72 hours. Cardiac Enzymes No results for input(s): CKTOTAL, CKMB, CKMBINDEX, TROPONINI in the last 72 hours.  BNP: BNP (last 3 results) Recent Labs    09/26/17 0955 10/03/17 2347  BNP 537.4* 362.5*   ProBNP (last 3 results) No results for input(s): PROBNP in the last 8760 hours.   D-Dimer No results for input(s): DDIMER in the last 72 hours. Hemoglobin A1C No results for input(s): HGBA1C in the last  72 hours. Fasting Lipid Panel No results for input(s): CHOL, HDL, LDLCALC, TRIG, CHOLHDL, LDLDIRECT in the last 72 hours. Thyroid Function Tests No results for input(s): TSH, T4TOTAL, T3FREE, THYROIDAB in the last 72 hours.  Invalid input(s): FREET3  Other results:  Imaging   Logan Jodene Nam Head Wo Contrast  Result Date: 10/11/2017 CLINICAL DATA:  Stroke followup EXAM: MRA HEAD WITHOUT CONTRAST TECHNIQUE: Angiographic images of the Circle of Willis were obtained using MRA technique without intravenous contrast. COMPARISON:  None. FINDINGS: Both vertebral arteries patent to the basilar. PICA not visualized. Large AICA bilaterally may supply the PICA territory. Basilar widely patent. Superior cerebellar and posterior cerebral arteries widely patent  bilaterally. Internal carotid artery widely patent bilaterally. Anterior and middle cerebral arteries widely patent bilaterally. No significant stenosis. Negative for aneurysm. IMPRESSION: Negative Electronically Signed   By: Franchot Gallo M.D.   On: 10/11/2017 16:34   Logan Brain W Contrast  Result Date: 10/11/2017 CLINICAL DATA:  Stroke follow-up EXAM: MRI HEAD WITH CONTRAST TECHNIQUE: Multiplanar, multiecho pulse sequences of the brain and surrounding structures were obtained with intravenous contrast. CONTRAST:  55mL MULTIHANCE GADOBENATE DIMEGLUMINE 529 MG/ML IV SOLN COMPARISON:  Unenhanced MRI 10/10/2017 FINDINGS: Small acute infarct left cerebellum noted on the prior MRI. This does not show abnormal enhancement. No enhancing brain lesions identified. Leptomeningeal enhancement normal. Ventricle size and cerebral volume normal. IMPRESSION: Normal enhancement postcontrast administration. Electronically Signed   By: Franchot Gallo M.D.   On: 10/11/2017 16:15    Medications:    Scheduled Medications: . amiodarone  400 mg Oral BID  . aspirin EC  81 mg Oral Daily  . Chlorhexidine Gluconate Cloth  6 each Topical Daily  . digoxin  0.125 mg Oral Daily  . furosemide  40 mg Oral QPM  . furosemide  80 mg Oral Q breakfast  . losartan  25 mg Oral QPM  . pantoprazole  40 mg Oral Daily  . potassium chloride  40 mEq Oral Daily  . sodium chloride flush  10-40 mL Intracatheter Q12H  . spironolactone  25 mg Oral Daily    Infusions:   PRN Medications: acetaminophen, docusate, ondansetron (ZOFRAN) IV, sodium chloride flush, traZODone  Patient Profile   Logan [name] is 67 year old with history of HTN, A Fib, OSA, untreated prostate cancer 2012 , RLL mass 2015 declined treatment, hemoptysis for the last 6 months (he stopped xarelto), and chronic systolic heart failure.   Admitted with A fib RVR and A/C systolic heart failure.   Assessment/Plan   1. Acute on chronic systolic CHF: -  TEE 2/72 with  EF 15%. Long history of nonischemic cardiomyopathy. No ICD. He was admitted with afib/RVR and dyspnea. Failed TEE-guided DCCV and intubated likely due to pulmonary edema. He is now off norepinephrine.  - He remains in and out of atrial fibrillation. - Coox low yesterday but suspect falsely so. Poor candidate for home inotrope support at this time.  - Continue lasix 80 mg q am and 40 mg q pm.  - Continue digoxin 0.125 mg daily.  - Continue losartan 25 mg daily, SBP in 90s generally so will not switch back to Santel yet.  - Continue spironolactone 25 daily.  -Long-term prognosis with combination of metastatic lung cancer and severe CHF(low output) is very poor. He is not a candidate for advanced cardiac therapies.  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 Seen by oncology, not candidate for chemotherapy but may be candidate for immunotherapy depending on genomics.  -  Logan brain negative for mets.  - No change to current plan.   3. Hemoptysis: - Has had to come off heparin gtt with hemoptysis.  This has now resolved off anticoagulation.  Likely from lung mass.  - He was put on ASA 81 when small CVA found on MRI, so far no recurrent hemoptysis.  4. Atrial fibrillation:  - Initially with RVR, likely was driving CHF. He failed DCCV but then with additionofamiodaronehad gone back to NSR on 7/24, but on 7/25 he was back in afib.  - Remains in and out of Afib (spends significant time in NSR). Rate relatively well controlled.  - Continue po amiodarone 400 mg BID x 1 week on discharge, then 200 mg BID.  - Continue digoxin for rate control - As above, unable to anticoagulate at this time with profuse hemoptysis from lung tumor on anticoagulation. If he tolerates ASA for a couple of weeks, would re-challenge with Eliquis given CVA on MRI.  5. AKI: - Stable at 1.37 today. 6. H/o elevated PSA:  - Has declined workup in past. No change.  7. Acute hypoxemic respiratory failure:   - Suspect due to pulmonary edema + large RLL mass/hemoptysis.  - H influenzae in sputum, treating with ceftriaxone x 7 days.  - Afebrile.  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  Likely due to atrial fibrillation.  He is off anticoagulation due to severe hemoptysis from lung mass.  He was started on ASA 81.  Would give him two weeks off anticoagulation, then re-challenge with Eliquis at that time.    He is stable for discharge to home from HF perspective  HF medications for home.  - Lasix 80 qam/40 qpm with KCl 40 daily.  - Digoxin 0.125 mg daily - Losartan 25 mg qhs - Spironolactone 25 mg daily - Amiodarone 400 mg BID x 1 week, then 200 mg BID.   HF follow up has been set up and placed in AVS.   Length of Stay: Warden, Hershal Coria  10/12/2017, 7:24 AM  Advanced Heart Failure Team Pager [phone] (M-F; 7a - 4p)  Please contact [facility] for night-coverage after hours (4p -7a ) and weekends on [email or web address]  Patient seen with PA, agree with the above note.   Rhythm is in and out of what appears to be atypical atrial flutter.  Currently in atrial flutter but was in NSR earlier.   He feels good, creatinine stable at 1.37.  No dyspnea walking around room.    On exam, no JVP, irregular S1S2, no edema, decreased BS at bases.  As above, he seems to be predominantly in NSR.  Will continue amiodarone 400 mg bid (x 1 week, then 200 mg bid).  He is not going to be anticoagulated due to severe hemoptysis on anticoagulation initially this hospitalization.  Hemoptysis has now resolved.  Cardioversion will not be a future option as long as he is off anticoagulation.  This is complicated by small cerebellar CVA seen on MRI (no obvious signs of CVA on exam).  He was started on ASA 81.  We will keep him on this x 2 weeks.  If no further hemoptysis, would stop ASA at that time and re-challenge with Eliquis.   Volume status looks ok.   Continue Lasix 80 qam/40 qpm for home.  Creatinine stable.  He will continue current digoxin, spironolactone, and losartan.  No BP room to titrate (will not restart Entresto yet,  can try to do this as outpatient).   Antibiotics per primary service, had H influenzae in sputum => complete ceftriaxone today.   Poor overall prognosis with CHF and metastatic lung cancer. To followup with oncology as outpatient re: treatment options.  From my standpoint, I think he can go home.  He will need the cardiac meds listed above.  Will have close followup in CHF clinic to consider restarting Eliquis.   Loralie Champagne 10/12/2017 8:10 AM

## 2017-10-16 ENCOUNTER — Telehealth: Payer: Self-pay | Admitting: Hematology

## 2017-10-16 NOTE — Telephone Encounter (Signed)
Pt's wife cld back to move appt to 8/7 at [place] to arrive 30 minutes early.

## 2017-10-16 NOTE — Telephone Encounter (Signed)
Received a call from the patient's wife to schedule a hospital follow up. Pt has been scheduled to see Dr. Burr Medico on 8/9 at 1045am. Aware to arrive 30 minutes early.

## 2017-10-17 ENCOUNTER — Encounter (HOSPITAL_COMMUNITY): Payer: Self-pay | Admitting: Hematology

## 2017-10-17 NOTE — Progress Notes (Incomplete)
[place]  [phone] [phone]  Clinic Follow up Note   Patient Care Team: [name], MD as PCP - General (Family Medicine) [name], MD as PCP - Cardiology (Cardiology) Dickie La, RN as [place] Management   Date of Service:  10/17/2017  SUMMARY OF ONCOLOGIC HISTORY:  No history exists.   History of present illness:   09/28/17 Logan [name] 67 y.o. male presented to the ED for worsening SOB due to CHF. Workup shows multiple masses concerning for cancer. He presents with his wife and 2 family friends.   He had prostate cancer in 2012 found by biopsy. He was under observation as he declined treatment at the time. He notes his lung mass was found in 2015 which was incidentally discovered while in hospital for CHF and has increased in size along with chest lymph nodes. He has declined biopsies, and scans for these in the past.   He notes other than heart failure he feels fine. His wife notes him coughing up blood for the past 6 months. He notes this occurs when he is on Xarelto as he has Afib. He stopped Xarelto and went back to baby aspirin and blood decreased. Now he sees no blood. Has leg swelling which makes it harder for him to walk and he has abdominal bloating as well with no pain. He notes his appetite has been down for the past few weeks due to bloating. He increased diuretics with overall no weight loss.   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 His [facility]ologist advised him to go to [facility] ED.   He has HTN which is controlled. He had a minor heart attack several years ago which may contribute to his current CHF. He use to smoke cigarettes for 8 years in the 1970s 1ppd. He grew up in the house with a heavy smoker. He now only smokes CBD. He drinks socially.    CURRENT THERAPY  ***   INTERVAL HISTORY: *** Logan [name]  is here for his first outpatient follow up of his recently diagnosed lung cancer. He was discharged on 10/12/17.   He presents to the clinic today      REVIEW OF SYSTEMS:  *** Constitutional: Denies fevers, chills or abnormal weight loss Eyes: Denies blurriness of vision Ears, nose, mouth, throat, and face: Denies mucositis or sore throat Respiratory: Denies cough, dyspnea or wheezes Cardiovascular: Denies palpitation, chest discomfort or lower extremity swelling Gastrointestinal:  Denies nausea, heartburn or change in bowel habits Skin: Denies abnormal skin rashes Lymphatics: Denies new lymphadenopathy or easy bruising Neurological:Denies numbness, tingling or new weaknesses Behavioral/Psych: Mood is stable, no new changes  All other systems were reviewed with the patient and are negative.  MEDICAL HISTORY:  Past Medical History:  Diagnosis Date   CHF (congestive heart failure) ([place])    Chronic systolic heart failure ([place]) 04/15/2015    EF 30-35% by echo 2016 , [facility]    Hypertension    Lung mass 04/15/2015    Never biopsied. History of PET scan that raised concern.    Metabolic syndrome 06/13/7060   OSA on CPAP    Paroxysmal atrial fibrillation ([place]) 04/15/2015   On apixaban    Prostate cancer ([place])    Thrombocytopenia ([place]) 04/15/2015    SURGICAL HISTORY: Past Surgical History:  Procedure Laterality Date   CARDIAC CATHETERIZATION N/A 04/27/2015   Procedure:  Right/Left Heart Cath and Coronary Angiography;  Surgeon: [name], MD;  Location: [place] [facility];  Service: Cardiovascular;  Laterality: N/A;   CARDIOVERSION N/A 09/29/2017   Procedure: CARDIOVERSION;  Surgeon: Pixie Casino, MD;  Location: [facility];  Service: Cardiovascular;  Laterality: N/A;   IR THORACENTESIS ASP PLEURAL SPACE W/IMG GUIDE  09/27/2017   PROSTATE BIOPSY     TEE WITHOUT CARDIOVERSION N/A 09/29/2017   Procedure: TRANSESOPHAGEAL ECHOCARDIOGRAM (TEE);  Surgeon: Pixie Casino, MD;   Location: [facility] ENDOSCOPY;  Service: Cardiovascular;  Laterality: N/A;    I have reviewed the social history and family history with the patient and they are unchanged from previous note.  ALLERGIES:  has no active allergies.  MEDICATIONS:  Current Outpatient Medications  Medication Sig Dispense Refill   amiodarone (PACERONE) 400 MG tablet Take 1 tablet (400 mg total) by mouth 2 (two) times daily. 60 tablet 0   aspirin 81 MG tablet Take 81 mg by mouth daily.     Cholecalciferol ([employer] VIT D 5000 HIGH-POTENCY) 5000 units capsule Take 5,000 Units by mouth daily.     digoxin (LANOXIN) 0.125 MG tablet Take 1 tablet (0.125 mg total) by mouth daily. 30 tablet 0   furosemide (LASIX) 40 MG tablet Take 1 tablet (40 mg total) by mouth every evening. 30 tablet 0   furosemide (LASIX) 80 MG tablet Take 1 tablet (80 mg total) by mouth daily with breakfast. 30 tablet 0   losartan (COZAAR) 25 MG tablet Take 1 tablet (25 mg total) by mouth every evening. 30 tablet 1   Multiple Vitamin (MULTIVITAMIN) tablet Take 1 tablet by mouth daily.     pantoprazole (PROTONIX) 40 MG tablet Take 1 tablet (40 mg total) by mouth daily. 30 tablet 0   potassium chloride SA (K-DUR,KLOR-CON) 20 MEQ tablet Take 2 tablets (40 mEq total) by mouth daily. 30 tablet 0   spironolactone (ALDACTONE) 25 MG tablet Take 1 tablet (25 mg total) by mouth daily. 30 tablet 0   traZODone (DESYREL) 50 MG tablet Take 1 tablet (50 mg total) by mouth at bedtime as needed for sleep. 20 tablet 0   No current facility-administered medications for this visit.     PHYSICAL EXAMINATION: ECOG PERFORMANCE STATUS: {CHL ONC ECOG PS:[id number]}  There were no vitals filed for this visit. There were no vitals filed for this visit. *** GENERAL:alert, no distress and comfortable SKIN: skin color, texture, turgor are normal, no rashes or significant lesions EYES: normal, Conjunctiva are pink and non-injected, sclera clear OROPHARYNX:no exudate, no  erythema and lips, buccal mucosa, and tongue normal  NECK: supple, thyroid normal size, non-tender, without nodularity LYMPH:  no palpable lymphadenopathy in the cervical, axillary or inguinal LUNGS: clear to auscultation and percussion with normal breathing effort HEART: regular rate & rhythm and no murmurs and no lower extremity edema ABDOMEN:abdomen soft, non-tender and normal bowel sounds Musculoskeletal:no cyanosis of digits and no clubbing  NEURO: alert & oriented x 3 with fluent speech, no focal motor/sensory deficits  LABORATORY DATA:  I have reviewed the data as listed CBC Latest Ref Rng & Units 10/12/2017 10/11/2017 10/10/2017  WBC 4.0 - 10.5 K/uL 8.2 7.8 7.5  Hemoglobin 13.0 - 17.0 g/dL 10.2(L) 10.2(L) 9.7(L)  Hematocrit 39.0 - 52.0 % 30.7(L) 31.0(L) 30.0(L)  Platelets 150 - 400 K/uL 331 313 286     CMP Latest Ref Rng & Units 10/12/2017 10/11/2017 10/10/2017  Glucose 70 - 99 mg/dL 163(H) 93 86  BUN 8 - 23  mg/dL '20 18 17  '$ Creatinine 0.61 - 1.24 mg/dL 1.37(H) 1.34(H) 1.41(H)  Sodium 135 - 145 mmol/L 137 138 137  Potassium 3.5 - 5.1 mmol/L 4.1 4.0 3.8  Chloride 98 - 111 mmol/L 102 100 100  CO2 22 - 32 mmol/L '26 31 27  '$ Calcium 8.9 - 10.3 mg/dL 8.6(L) 8.7(L) 8.7(L)  Total Protein 6.5 - 8.1 g/dL - 7.7 -  Total Bilirubin 0.3 - 1.2 mg/dL - 0.7 -  Alkaline Phos 38 - 126 U/L - 64 -  AST 15 - 41 U/L - 24 -  ALT 0 - 44 U/L - 29 -     PATHOLOGY  Diagnosis 10/02/17 Liver, needle/core biopsy, Left Hepatic Lobe - METASTATIC LUNG ADENOCARCINOMA TO THE LIVER. SEE NOTE Diagnosis Note Immunohistochemical stains show that the tumor cells are positive for CK7, TTF-1 and Napsin A; while they are negative for CK20 and Hepar, consistent with the above diagnosis   RADIOGRAPHIC STUDIES: I have personally reviewed the radiological images as listed and agreed with the findings in the report. No results found.    MRI Brain 10/10/17  IMPRESSION: 1. Single subcentimeter focus of diffusion  restriction in the left cerebellum is favored to be an acute ischemic infarct, given the lack of surrounding edema. A small metastasis could also have this appearance, but perilesional edema is typically much greater. If there are no contraindications, contrast administration might be helpful. 2. Otherwise normal brain for age.   CT AP W WO Contrast 09/27/17  IMPRESSION: 1. Large complex masses in the right lower lobe of the lung and in the right hepatic lobe. Smaller liver lesions in the left hepatic lobe. Nonspecific lesions in the spleen and kidneys. No significant pathologic adenopathy. This would not be a characteristic metastatic pattern for prostate cancer and is not a specific metastatic and imaging pattern to suggest an individual etiology. Possibilities might include metastatic lung cancer, melanoma, metastatic hepatocellular carcinoma, or a variety of other possibilities. Tissue diagnosis recommended. 2. Moderate prostatomegaly. 3. Scattered mild ascites. 4. Moderate cardiomegaly.  Small inferior pericardial effusion. 5. Trace bilateral pleural effusions.   CT Chest WO Contrast 09/26/17  IMPRESSION: 1. Large RIGHT lower lobe mass with central and peripheral calcifications occupies the near entirety of the RIGHT lower lobe and concerning for neoplasm. 2. Moderate RIGHT effusion. 3. Mild mediastinal and RIGHT hilar adenopathy. 4. Recommend FDG PET scan for biopsy planning/staging    ASSESSMENT & PLAN:  Logan [name] is a 67 y.o. *** male with a history of ***    1. 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  -His staging scans were negative for metastic disease elsewhere in the brain, chest, abdomen or pelvis.  -His lung cancer seems to be relatively slow-growing, the primary  tumor was found in 2015. He has little smoking history. I suspect his tumor may contain driving gene mutation such as EGFR. I ordered [facility] genomic testing and PD-L1 to see if he is a candidate for immunotherapy or targeted therapy. *** -Due to his advanced heart failure, he is not a candidate for intensive chemotherapy, which pt's wife agrees.  -We previously discussed overall incurable nature of his disease. If he can survive from his CHF, his estimated life expendency from his metastatic lung cancer is probably 6-12 months (better than average stage IV lung cancer due to  his indolent nature of his disease). If he is a candidate for targeted therapy, especially EGFR or ALK inhibitor, his estimated life expectancy from cancer standpoint is probably a few years.  -Due to his advanced CHF, it's very reasonable to consider hospice for him. I spoke with his wife, who indicates pt will definitely fight with his cancer, wants to try everything available to him, and wish to remains full code for now.  ***    #. Atrial fibrillation, CHF -Based on 09/29/17 ECHO he has cardiomyopathy with EF 10 to 78% -Certainly his advanced heart failure place an important role on his overall prognosis, may even be worse than his lung cancer.    #. B/l pleural effusion -The 09/27/17 thoracentesis for his b/l pleural effusions were negatitve for malignancy.     #. History of untreated prostate cancer, with rising PSA -He had untreated prostate cancer in 2012    #. Goal of care discussion  -We again discussed the incurable nature of his/her cancer, and the overall poor prognosis, especially if he does not have good response to chemotherapy or progress on chemo -The patient understands the goal of care is palliative. -I previously recommended DNR/DNI, He will think about it. He is currently FULL CODE.     PLAN:  ***   No problem-specific Assessment & Plan notes found for this encounter.   No orders  of the defined types were placed in this encounter.  All questions were answered. The patient knows to call the clinic with any problems, questions or concerns. No barriers to learning was detected. I spent {CHL ONC TIME VISIT - SXQKS:[id number]} counseling the patient face to face. The total time spent in the appointment was {CHL ONC TIME VISIT - VDIXV:[id number]} and more than 50% was on counseling and review of test results     Joslyn Devon 10/17/2017 12:33 PM   I, Joslyn Devon, am acting as scribe for Truitt Merle, MD.   {Add scribe attestation statement}

## 2017-10-18 ENCOUNTER — Encounter: Payer: Self-pay | Admitting: *Deleted

## 2017-10-18 ENCOUNTER — Inpatient Hospital Stay: Payer: 59 | Admitting: Hematology

## 2017-10-18 NOTE — Progress Notes (Signed)
Oncology Nurse Navigator Documentation  Oncology Nurse Navigator Flowsheets 10/18/2017  Navigator Location [facility]  Navigator Encounter Type Other/I called [facility] to check on when Logan [name]'s molecular test results will be completed and I was told it will be on Aug 9 but not clear if in the morning or afternoon. I will update Dr. Burr Medico  Treatment Phase Pre-Tx/Tx Discussion  Barriers/Navigation Needs Coordination of Care  Interventions Coordination of Care  Coordination of Care Other  Acuity Level 2  Time Spent with Patient 30

## 2017-10-19 ENCOUNTER — Other Ambulatory Visit: Payer: Self-pay | Admitting: *Deleted

## 2017-10-19 MED ORDER — ALECTINIB HCL 150 MG PO CAPS: 600.0000 mg | ORAL_CAPSULE | Freq: Two times a day (BID) | ORAL | 1 refills | Status: DC

## 2017-10-19 NOTE — Patient Outreach (Signed)
[facility]) Care Management  10/19/2017  NASHUA HOMEWOOD 09/25/1950 [id number]  Referral via EMMI-Stroke-RED Alert-Day#1, 8/5, Day#3, 8/5; Reason-Scheduled follow up appointment-no Feeling worse-yes  Telephone call to patient; spouse answered call and voices that she is taking all of patient's calls including robotic calls from hospital. HIPPA verification received.   Spouse/caregiver voices that follow up appointments have been made with all of specialists. States that patient is in the process of changing primary  care providers and they are waiting to see if new provider is accepting patients.  States when they respond she will be able to make follow up appointment for spouse.   States patient has not had any stroke symptoms but knows to call 911 if he experiences symptoms. States she is aware of symptoms that may indicate stroke. States when he was hospitalized he was diagnosed of having had ministroke that showed up on CT scan but patient did not have any outward signs or symptoms.     States patient is feeling better since hospital stay. Voices that she & patient are managing his medications & that patient is taking medications as ordered by his doctors.   States she is taking patient to all of his appointments.    States no further health concerns.  EMMI RED alert has been addressed. Plan: Will follow up in 14 business days.   Sherrin Daisy, RN BSN [facility] Coordinator [facility] Care Management  [phone]

## 2017-10-20 ENCOUNTER — Inpatient Hospital Stay: Payer: 59 | Admitting: Hematology

## 2017-10-20 ENCOUNTER — Encounter: Payer: Self-pay | Admitting: Hematology

## 2017-10-20 ENCOUNTER — Telehealth: Payer: Self-pay

## 2017-10-20 ENCOUNTER — Telehealth: Payer: Self-pay | Admitting: Pharmacist

## 2017-10-20 ENCOUNTER — Inpatient Hospital Stay: Payer: 59 | Attending: Hematology | Admitting: Hematology

## 2017-10-20 ENCOUNTER — Other Ambulatory Visit: Payer: Self-pay | Admitting: *Deleted

## 2017-10-20 ENCOUNTER — Other Ambulatory Visit: Payer: Self-pay | Admitting: Hematology

## 2017-10-20 ENCOUNTER — Inpatient Hospital Stay (HOSPITAL_BASED_OUTPATIENT_CLINIC_OR_DEPARTMENT_OTHER): Payer: 59 | Admitting: Hematology

## 2017-10-20 VITALS — BP 111/62 | HR 87 | Temp 98.1°F | Resp 18 | Ht 71.0 in | Wt 215.9 lb

## 2017-10-20 DIAGNOSIS — C3491 Malignant neoplasm of unspecified part of right bronchus or lung: Secondary | ICD-10-CM

## 2017-10-20 DIAGNOSIS — I110 Hypertensive heart disease with heart failure: Secondary | ICD-10-CM | POA: Insufficient documentation

## 2017-10-20 DIAGNOSIS — C787 Secondary malignant neoplasm of liver and intrahepatic bile duct: Secondary | ICD-10-CM | POA: Insufficient documentation

## 2017-10-20 DIAGNOSIS — J90 Pleural effusion, not elsewhere classified: Secondary | ICD-10-CM | POA: Diagnosis not present

## 2017-10-20 DIAGNOSIS — C3431 Malignant neoplasm of lower lobe, right bronchus or lung: Secondary | ICD-10-CM | POA: Insufficient documentation

## 2017-10-20 DIAGNOSIS — C61 Malignant neoplasm of prostate: Secondary | ICD-10-CM | POA: Insufficient documentation

## 2017-10-20 DIAGNOSIS — I4891 Unspecified atrial fibrillation: Secondary | ICD-10-CM | POA: Diagnosis not present

## 2017-10-20 MED ORDER — ALECTINIB HCL 150 MG PO CAPS: 600.0000 mg | ORAL_CAPSULE | Freq: Two times a day (BID) | ORAL | 1 refills | Status: DC

## 2017-10-20 MED FILL — ALECENSA 150 MG CAPSULE: 150 | 30 days supply | Qty: 240 | Fill #0

## 2017-10-20 NOTE — Telephone Encounter (Signed)
Printed avs and calender of upcoming appointment. Per 8/9 los

## 2017-10-20 NOTE — Progress Notes (Signed)
Logan [name]  [phone] [phone]  Clinic Follow up Note   Patient Care Team: [name], MD as PCP - General (Family Medicine) [name], MD as PCP - Cardiology (Cardiology) [name], MD as Consulting Physician (Cardiology) 10/20/2017  CHIEF COMPLAIN: Follow-up with newly diagnosed metastatic lung adenocarcinoma.  SUMMARY OF ONCOLOGIC HISTORY: Oncology History   Cancer Staging Non-small cell lung cancer (NSCLC) ([place]) Staging form: Lung, AJCC 8th Edition - Clinical stage from 10/02/2017: Stage IV (cT3, cN2, cM1c) - Signed by Truitt Merle, MD on 10/20/2017      Non-small cell lung cancer (NSCLC) ([place])   04/01/2013 Imaging    04/01/2013 CXR IMPRESSION: 1. Cardiomegaly with diffuse increased interstitial markings consistent with pulmonary edema. 2. 5-6 cm rounded density seen in the right lower lobe medially. This could be related to an area of consolidation correlate clinically for pneumonia.. But the rounded and well-defined borders are suspicious for mass. Followup x-ray suggested after  treating for the pulmonary edema/pneumonia to see if this resolves.    04/30/2014 Imaging    04/30/2014 CXR IMPRESSION: Enlarging right lower lobe nodule. Would recommend PET CT to further evaluate.    01/20/2015 Imaging    01/20/2015 CXR IMPRESSION: Significant improvement comparatively, now with smaller pleural effusions and resolving pulmonary edema. Known right lower lobe mass suspicious for bronchogenic carcinoma is redemonstrated.    04/07/2015 PET scan    04/07/2015 PET Scan IMPRESSION:  Hypermetabolic right lower lobe mass Hypermetabolic left adrenal nodule.    08/21/2015 Imaging    08/21/2015 CXR IMPRESSION: Right lower lobe mass.    09/22/2017 Imaging    09/22/2017 CXR IMPRESSION: 1.Moderate right pleural effusion with right lower/middle lobe consolidation. Minimal patchy opacities in left lower lobe. 2.Cardiomegaly.    09/26/2017 Imaging    09/26/2017 CT Chest WO Contrast IMPRESSION: 1. Large RIGHT lower lobe mass with central and peripheral calcifications occupies the near entirety of the RIGHT lower lobe and concerning for neoplasm. 2. Moderate RIGHT effusion. 3. Mild mediastinal and RIGHT hilar adenopathy. 4. Recommend FDG PET scan for biopsy planning/staging    09/26/2017 Tumor Marker     Ref. Range 09/26/2017 21:31  Prostatic Specific Antigen Latest Ref Range: 0.00 - 4.00 ng/mL 84.83 (H)       09/27/2017 Imaging    09/27/2017 CT AP W WO Contrast IMPRESSION: 1. Large complex masses in the right lower lobe of the lung and in the right hepatic lobe. Smaller liver lesions in the left hepatic lobe. Nonspecific lesions in the spleen and kidneys. No significant pathologic adenopathy. This would not be a characteristic metastatic pattern for prostate cancer and is not a specific metastatic and imaging pattern to suggest an individual etiology. Possibilities might include metastatic lung cancer, melanoma, metastatic hepatocellular carcinoma, or a variety of other possibilities. Tissue diagnosis recommended. 2. Moderate prostatomegaly. 3. Scattered mild ascites. 4. Moderate cardiomegaly.  Small inferior pericardial effusion. 5. Trace bilateral pleural effusions.    10/02/2017 Pathology Results    10/02/2017 Surgical Pathology Diagnosis Liver, needle/core biopsy, Left Hepatic Lobe - METASTATIC LUNG ADENOCARCINOMA TO THE LIVER. SEE NOTE    10/02/2017 Cancer Staging    Staging form: Lung, AJCC 8th Edition - Clinical stage from 10/02/2017: Stage IV (cT3, cN2, cM1c) - Signed by Truitt Merle, MD on 10/20/2017    10/04/2017 Initial Diagnosis    Non-small cell lung cancer (NSCLC) ([place])    History of present illness:   09/28/17 Logan [name] 67 y.o. male presented to the  ED for worsening SOB due to CHF. Workup shows multiple masses concerning for cancer. He presents with his wife and 2 family friends.    He had prostate cancer in 2012 found by biopsy. He was under observation as he declined treatment at the time. He notes his lung mass was found in 2015 which was incidentally discovered while in hospital for CHF and has increased in size along with chest lymph nodes. He has declined biopsies, and scans for these in the past.   He notes other than heart failure he feels fine. His wife notes him coughing up blood for the past 6 months. He notes this occurs when he is on Xarelto as he has Afib. He stopped Xarelto and went back to baby aspirin and blood decreased. Now he sees no blood. Has leg swelling which makes it harder for him to walk and he has abdominal bloating as well with no pain. He notes his appetite has been down for the past few weeks due to bloating. He increased diuretics with overall no weight loss.   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 His [facility]ologist advised him to go to [facility] ED.   He has HTN which is controlled. He had a minor heart attack several years ago which may contribute to his current CHF. He use to smoke cigarettes for 8 years in the 1970s 1ppd. He grew up in the house with a heavy smoker. He now only smokes CBD. He drinks socially.    CURRENT THERAPY: pending Alectinib 670m bid   INTERVAL HISTORY: JFOXX KLARICHis a 67y.o. male who is here for follow up. He was admitted to the hospital on 09/26/2017 for worsening leg swelling and SOB [place] on 10/12/2017. He was treated for advanced heart failure, and required intubation and ICU stay. I met him in the hospital, and he was diagnosed with metastatic lung cancer during hospitalization.  He is here to discuss his treatment options, he is accompanied by his wife. He feels good. He sometimes feels lightheaded. He can carry out his daily activities without major limitations. He tries to stay active. He is  not in pain. His appetite is improving   He sleeps on the bed with one pillow. He currently uses O2 at night only without CPAP. His wife says that he snores more without the CPAP, but feels better when he wakes up.   REVIEW OF SYSTEMS:  Constitutional: Denies fevers, chills or abnormal weight loss (+) improving appetite  Eyes: Denies blurriness of vision Ears, nose, mouth, throat, and face: Denies mucositis or sore throat Respiratory: Denies cough, dyspnea or wheezes (+) OSA, uses nocturnal O2 Cardiovascular: Denies palpitation, chest discomfort or lower extremity swelling Gastrointestinal:  Denies nausea, heartburn or change in bowel habits Skin: Denies abnormal skin rashes Lymphatics: Denies new lymphadenopathy or easy bruising Neurological:Denies numbness, tingling or new weaknesses Behavioral/Psych: Mood is stable, no new changes  All other systems were reviewed with the patient and are negative.   MEDICAL HISTORY:  Past Medical History:  Diagnosis Date  . CHF (congestive heart failure) ([place]   . Chronic systolic heart failure ([place] 04/15/2015    EF 30-35% by echo 2016 , [facility]   . Hypertension   . Lung mass 04/15/2015    Never biopsied. History of PET scan that raised concern.   . Metabolic syndrome 21/09/15 . OSA on  CPAP   . Paroxysmal atrial fibrillation ([employer]) 04/15/2015   On apixaban   . Prostate cancer ([place])   . Thrombocytopenia ([place]) 04/15/2015    SURGICAL HISTORY: Past Surgical History:  Procedure Laterality Date  . CARDIAC CATHETERIZATION N/A 04/27/2015   Procedure: Right/Left Heart Cath and Coronary Angiography;  Surgeon: [name], MD;  Location: [facility];  Service: Cardiovascular;  Laterality: N/A;  . CARDIOVERSION N/A 09/29/2017   Procedure: CARDIOVERSION;  Surgeon: Pixie Casino, MD;  Location: [facility];  Service: Cardiovascular;  Laterality: N/A;  . IR THORACENTESIS ASP PLEURAL SPACE W/IMG GUIDE  09/27/2017  . PROSTATE BIOPSY    . TEE  WITHOUT CARDIOVERSION N/A 09/29/2017   Procedure: TRANSESOPHAGEAL ECHOCARDIOGRAM (TEE);  Surgeon: Pixie Casino, MD;  Location: [facility] ENDOSCOPY;  Service: Cardiovascular;  Laterality: N/A;    I have reviewed the social history and family history with the patient and they are unchanged from previous note.  ALLERGIES:  has No Known Allergies.  MEDICATIONS:  Current Outpatient Medications  Medication Sig Dispense Refill  . amiodarone (PACERONE) 400 MG tablet Take 1 tablet (400 mg total) by mouth 2 (two) times daily. 60 tablet 0  . aspirin 81 MG tablet Take 81 mg by mouth daily.    . Cholecalciferol (CVS VIT D 5000 HIGH-POTENCY) 5000 units capsule Take 5,000 Units by mouth daily.    . digoxin (LANOXIN) 0.125 MG tablet Take 1 tablet (0.125 mg total) by mouth daily. 30 tablet 0  . furosemide (LASIX) 40 MG tablet Take 1 tablet (40 mg total) by mouth every evening. 30 tablet 0  . furosemide (LASIX) 80 MG tablet Take 1 tablet (80 mg total) by mouth daily with breakfast. 30 tablet 0  . losartan (COZAAR) 25 MG tablet Take 1 tablet (25 mg total) by mouth every evening. 30 tablet 1  . Multiple Vitamin (MULTIVITAMIN) tablet Take 1 tablet by mouth daily.    . pantoprazole (PROTONIX) 40 MG tablet Take 1 tablet (40 mg total) by mouth daily. 30 tablet 0  . potassium chloride SA (K-DUR,KLOR-CON) 20 MEQ tablet Take 2 tablets (40 mEq total) by mouth daily. 30 tablet 0  . spironolactone (ALDACTONE) 25 MG tablet Take 1 tablet (25 mg total) by mouth daily. 30 tablet 0  . traZODone (DESYREL) 50 MG tablet Take 1 tablet (50 mg total) by mouth at bedtime as needed for sleep. 20 tablet 0   No current facility-administered medications for this visit.     PHYSICAL EXAMINATION: ECOG PERFORMANCE STATUS: 2 - Symptomatic, <50% confined to bed  Vitals:   10/20/17 1439  BP: 111/62  Pulse: 87  Resp: 18  Temp: 98.1 F (36.7 C)  SpO2: 98%   Filed Weights   10/20/17 1439  Weight: 215 lb 14.4 oz (97.9 kg)     GENERAL:alert, no distress and comfortable  SKIN: skin color, texture, turgor are normal, no rashes or significant lesions EYES: normal, Conjunctiva are pink and non-injected, sclera clear OROPHARYNX:no exudate, no erythema and lips, buccal mucosa, and tongue normal  NECK: supple, thyroid normal size, non-tender, without nodularity LYMPH:  no palpable lymphadenopathy in the cervical, axillary or inguinal LUNGS: clear to percussion with normal breathing effort (+) bilateral basilar rales HEART: regular rate & rhythm and no murmurs and no lower extremity edema ABDOMEN:abdomen soft, non-tender and normal bowel sounds Musculoskeletal:no cyanosis of digits and no clubbing  NEURO: alert & oriented x 3 with fluent speech, no focal motor/sensory deficits  LABORATORY DATA:  I have  reviewed the data as listed CBC Latest Ref Rng & Units 10/12/2017 10/11/2017 10/10/2017  WBC 4.0 - 10.5 K/uL 8.2 7.8 7.5  Hemoglobin 13.0 - 17.0 g/dL 10.2(L) 10.2(L) 9.7(L)  Hematocrit 39.0 - 52.0 % 30.7(L) 31.0(L) 30.0(L)  Platelets 150 - 400 K/uL 331 313 286     CMP Latest Ref Rng & Units 10/12/2017 10/11/2017 10/10/2017  Glucose 70 - 99 mg/dL 163(H) 93 86  BUN 8 - 23 mg/dL _0 Creatinine 0.61 - 1.24 mg/dL 1.37(H) 1.34(H) 1.41(H)  Sodium 135 - 145 mmol/L 137 138 137  Potassium 3.5 - 5.1 mmol/L 4.1 4.0 3.8  Chloride 98 - 111 mmol/L 102 100 100  CO2 22 - 32 mmol/L _1 Calcium 8.9 - 10.3 mg/dL 8.6(L) 8.7(L) 8.7(L)  Total Protein 6.5 - 8.1 g/dL - 7.7 -  Total Bilirubin 0.3 - 1.2 mg/dL - 0.7 -  Alkaline Phos 38 - 126 U/L - 64 -  AST 15 - 41 U/L - 24 -  ALT 0 - 44 U/L - 29 -   Results for Levandoski, Rae A (MRN [id number]) as of 10/19/2017 17:28  Ref. Range 09/26/2017 21:31  Prostatic Specific Antigen Latest Ref Range: 0.00 - 4.00 ng/mL 84.83 (H)     PATHOLOGY  10/02/2017 Molecular Pathology    10/02/2017 Surgical Pathology Diagnosis Liver, needle/core biopsy, Left Hepatic Lobe - METASTATIC  LUNG ADENOCARCINOMA TO THE LIVER. SEE NOTE Diagnosis Note Immunohistochemical stains show that the tumor cells are positive for CK7, TTF-1 and Napsin A; while they are negative for CK20 and Hepar, consistent with the above diagnosis Specimen(s) Obtained: Liver, needle/core biopsy, Left Hepatic Lobe Specimen Clinical Information Right lung lesion with liver lesions, concern for metastatic disease (nt) Gross Received in formalin are four cores of pale tan-pink soft tissue ranging from 0.9 to 1.1 cm, submitted entirely in two cassettes. (AK:ah 10/02/17) Stain(s) used in Diagnosis: The following stain(s) were used in diagnosing the case: Thyroid Transcription Factor -1, Napsin-A, HEPAR-1, CK-7, CK 20. The control(s) stained appropriately.   RADIOGRAPHIC STUDIES: I have personally reviewed the radiological images as listed and agreed with the findings in the report.  09/13/2017 MRI Brain WO Contrast IMPRESSION: 1. Single subcentimeter focus of diffusion restriction in the left cerebellum is favored to be an acute ischemic infarct, given the lack of surrounding edema. A small metastasis could also have this appearance, but perilesional edema is typically much greater. If there are no contraindications, contrast administration might be helpful. 2. Otherwise normal brain for age.  09/27/2017 CT AP W WO Contrast IMPRESSION: 1. Large complex masses in the right lower lobe of the lung and in the right hepatic lobe. Smaller liver lesions in the left hepatic lobe. Nonspecific lesions in the spleen and kidneys. No significant pathologic adenopathy. This would not be a characteristic metastatic pattern for prostate cancer and is not a specific metastatic and imaging pattern to suggest an individual etiology. Possibilities might include metastatic lung cancer, melanoma, metastatic hepatocellular carcinoma, or a variety of other possibilities. Tissue diagnosis recommended. 2. Moderate  prostatomegaly. 3. Scattered mild ascites. 4. Moderate cardiomegaly.  Small inferior pericardial effusion. 5. Trace bilateral pleural effusions.  09/26/2017 CT Chest WO Contrast IMPRESSION: 1. Large RIGHT lower lobe mass with central and peripheral calcifications occupies the near entirety of the RIGHT lower lobe and concerning for neoplasm. 2. Moderate RIGHT effusion. 3. Mild mediastinal and RIGHT hilar adenopathy. 4. Recommend FDG PET scan for biopsy planning/staging  09/22/2017 CXR IMPRESSION: 1.Moderate right pleural  effusion with right lower/middle lobe consolidation. Minimal patchy opacities in left lower lobe. 2.Cardiomegaly.  08/21/2015 CXR IMPRESSION: Right lower lobe mass.  04/07/2015 PET Scan IMPRESSION:  Hypermetabolic right lower lobe mass Hypermetabolic left adrenal nodule.  01/20/2015 CXR IMPRESSION: Significant improvement comparatively, now with smaller pleural effusions and resolving pulmonary edema. Known right lower lobe mass suspicious for bronchogenic carcinoma is redemonstrated.  04/30/2014 CXR IMPRESSION: Enlarging right lower lobe nodule. Would recommend PET CT to further evaluate.  04/01/2013 CXR IMPRESSION: 1. Cardiomegaly with diffuse increased interstitial markings consistent with pulmonary edema. 2. 5-6 cm rounded density seen in the right lower lobe medially. This could be related to an area of consolidation correlate clinically for pneumonia.. But the rounded and well-defined borders are suspicious for mass. Followup x-ray suggested after  treating for the pulmonary edema/pneumonia to see if this resolves.   ASSESSMENT & PLAN:  OLEY LAHAIE is a 67 y.o. male who presented to the ED on 09/26/17 for worsening SOB and leg swelling. He has a h/o a lung mass since 2015 that has not been evaluated and H/o prostate cancer on observation.   1. 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 -We discussed that his cancer is not curable at this stage, and the goal of therapy is palliative, to prolong his life and preserve his quality of life. -Due to his advanced heart failure, he is a good candidate for intensive chemotherapy. -His lung cancer seems to be relatively slow-growing, the primary tumor was found in 2015. He has little smoking history. I suspect his tumor may contain driving gene mutation such as EGFR or ALK.  -I discussed his genomic testing foundation one test result, which fortunately showed ALK-EML4 translocation, which predicts excellent response to ALK inhibitor, and overall better prognosis. -based on the NCCN guidelines and the most recent ALEX trial data, I recommend Alectinib (longer PFS and less brain metastasis than crizotinib).  -The potential side effects, which includes but not limited to, fatigue, bradycardia, abnormal liver function, diarrhea, skin rash, etc., were discussed with patient in details.  He also met our oral pharmacist Evelena Peat today and had extensive counseling about the medication. Evelena Peat has helped him to get his prescription filled today at [facility] with very low copay.  -He is recovering well after his hospital stay. I advised him to monitor for hemoptysis as it can be from cancer, even though I suspect the episode he had was from anti-coagulation which he has stopped  -he will start Alectinib tomorrow  -Due to the potential cardiac toxicity, especially bradycardia, I will discuss his cardiologist Dr. Aundra Dubin about his amiodarone -Lab and f/u with lacie at 8:30am on 8/26 -plan to repeat staging CT scan in 3 months   2.   History of untreated prostate cancer, with rising PSA -will f/u PSA   3.  Bilateral pleural effusion,R>L, likely related to CHF -s/p thoracentesis 7/17 with cytology showing reactive mesothelial cells - stable on f/u CXR. -he is on lasix   4. 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 Atrial Fibrillation -Was on Xarelto but off due to hemoptysis -Still has hemoptysis during admission -Currently on aspirin 81, hemoptysis much better -Continue aspirin 81 and follow-up with cardiology to decide on antiplatelet versus anticoagulation -On amiodarone and digoxin -Rate controlled  6.Incidental stroke:  left cerebellar small infarct -Resultant no neurological focal deficit -MRI punctate to small left cerebellar infarct -MRA unremarkable -Carotid Doppler unremarkable -Xarelto (rivaroxaban) daily prior to admission (off due to hemoptysis), now on aspirin 81 mg daily.  Ideally, patient should be on anticoagulation however due to severe hemoptysis, hematuria and anemia, patient not anticoagulated candidate at this time.  Agree with cardiology plan to continue aspirin 81 and then follow-up as outpatient. -Patient counseled to be compliant with his antithrombotic medications -Ongoing aggressive stroke risk factor management  7. Goal of care discussion  -We again discussed the incurable nature of his cancer, and the overall poor prognosis, especially if he does not have good response to treatment or progressed on treatment  -The patient understands the goal of care is palliative. -He is full code now    Plan: -He will start Alectinib tomorrow  -Lab and f/u with lacie on 8/28     All questions were answered. The patient knows to call the clinic with any problems, questions or  concerns. No barriers to learning was detected. I spent 30 minutes counseling the patient face to face. The total time spent in the appointment was 40 minutes and more than 50% was on counseling and review of test results  I, Noor Dweik am acting as scribe for Dr. Truitt Merle.  I have reviewed the above documentation for accuracy and completeness, and I agree with the above.     Truitt Merle, MD 10/20/2017

## 2017-10-20 NOTE — Telephone Encounter (Signed)
Oral Oncology Pharmacist Encounter  Received new prescription for Alecensa (alectinib) for the treatment of newly diagnosed ALK-rearrangement positive, metastatic non small cell lung cancer, planned duration until disease progression or unacceptable toxicity.  Labs from Epic assessed 10/12/2017 SCr=1.37, est CrCl ~ 70 mL/min (may be overestimated with wt=98.3kg) No dose adjustment per manufacturer for renal dysfunction  HR in Epic reviewed, range 66-111 bpm, risk of brady cardia will be discussed with patient  No baseline CPK, this will be monitored during treatment with Alecensa  Noted patient with PMH that includes chronic systolic heart failure and paroxysmal atrial fibrillation 09/29/2017 TEE shows LVEF extremely reduced at 15% 10/10/2017 shows ventricular rate of 84 bpm  Current medication list in Epic reviewed, moderate DDIs with Alecensa and amiodarone identified:  Category C interaction due to both agents ability to produce bradycardia. HR will be monitred during treatment. This will be discussed with patient. No change to current therapy indicated at this time.  Prescription has been e-scribed to the [facility] for benefits analysis and approval.  Oral Oncology Clinic will continue to follow for insurance authorization, copayment issues, initial counseling and start date.  Johny Drilling, PharmD, BCPS, BCOP  10/20/2017 8:27 AM Oral Oncology Clinic [phone]

## 2017-10-20 NOTE — Telephone Encounter (Signed)
Oral Chemotherapy Pharmacist Encounter   I spoke with patient and wife in exam room for overview of: Alecensa.  Counseled patient on administration, dosing, side effects, monitoring, drug-food interactions, safe handling, storage, and disposal.  Patient will take Alecensa 150mg  capsules, 4 capsules (600mg ) by mouth twice daily with food.  Alecensa start date: 10/21/2017  Adverse effects include but are not limited to: bradycardia, muscle pain, edema, constipation, fatigue, diarrhea, skin photosensitivity, visual disturbances, headache, skin rash, increased blood sugar, decreased electrolytes, hepatotoxicity, and decreased blood counts.    Reviewed with patient importance of keeping a medication schedule and plan for any missed doses.  Medication reconciliation performed and medication/allergy list updated.  We extensively discussed medication interaction with amiodarone and increased risk of bradycardia. Patient states he checks his heart rate several times daily. He will ensure his heart rate is at 60 bpm prior to administration of any dose of his Alecensa.  Patient and wife informed insurance authorization from [place] has been approved. Due to commercial insurance patient was eligible for manufacturer co-pay card to reduce out-of-pocket expense for Alecensa to $5 per fill.  Patient will pick up his first fill of Alecensa from the [facility] today. Patient informed subsequent fills will come through [facility] per insurance requirement. Patient will still have 1 fill at a local pharmacy for the year if needed.  Oral oncology clinic will coordinate refill prescription of Alecensa to [facility] when claim is able to be processed again in 21 days.  Patient instructed to give [place] [facility] co-pay card billing information at the time when they call to schedule his first shipment.  All questions answered.  Mr. and Mrs.  Buckman voiced understanding and appreciation.   Patient knows to call the office with questions or concerns. Oral Oncology Clinic will continue to follow.  Thank you,  Johny Drilling, PharmD, BCPS, BCOP  10/20/2017   3:56 PM Oral Oncology Clinic [phone]

## 2017-10-20 NOTE — Patient Outreach (Addendum)
[facility] Care Management  10/20/2017  Davione Lenker Karr [birth year] [id number]  RED Alert--EMMI-Stroke referral-Day#6, 10/19/2017 Reason: Question about med?-yes.  This was dealt with on call 08/08 while speaking with patient's spouse/caregiver. Caregiver advised that she would ask question regarding medication at follow up appointment with patient's specialist next Monday.  Plan: Follow up scheduled.  Sherrin Daisy, RN BSN CCM Care Management Coordinator [facility] Care Management  [phone] .

## 2017-10-20 NOTE — Telephone Encounter (Signed)
Oral Oncology Pharmacist Encounter  Insurance authorization for Logan [name] has been approved. Test claim at the [facility] reveals copayment $706.59 for 1st 30-day supply  I enrolled patient for copayment card from [place]: [id number] Helper: Haivana Nakya, PharmD, BCPS, BCOP  10/20/2017 1:20 PM Oral Oncology Clinic [phone]

## 2017-10-20 NOTE — Telephone Encounter (Signed)
Oral Oncology Pharmacist Encounter  Urgent insurance authorization for [facility] submitted to [place] has been approved Auth # [id number] Effective dates: 10/20/17-04/18/18  Johny Drilling, PharmD, BCPS, BCOP  10/20/2017 12:48 PM Oral Oncology Clinic [phone]

## 2017-10-22 ENCOUNTER — Encounter: Payer: Self-pay | Admitting: Hematology

## 2017-10-22 ENCOUNTER — Encounter (HOSPITAL_COMMUNITY): Payer: Self-pay

## 2017-10-22 NOTE — Progress Notes (Signed)
Advanced Heart Failure Clinic Note   Referring Physician: PCP: [name], MD PCP-Cardiologist: Sinclair Grooms, MD  HF: Dr Aundra Dubin  HPI: [name] is 67 year old with history of HTN, A Fib, OSA, untreated prostate cancer 2012 , RLL mass 2015 declined treatment, hemoptysis for the last 6 months (he stopped xarelto), and chronic systolic heart failure.   Admitted 5/28-06/13/30 with A/C systolic HF. Required thoracentesis 7/17. Echo showed EF 15% with biventricular failure. Course complicated by Afib RVR and associated flash pulmonary edema. Required intubation and pressors. Diuresed with IV lasix. Oncology consulted for lung cancer (RLL) with liver mets. He is not a candidate for chemotherapy, but might be candidate for immunotherapy depending on genomics. MRI brain was negative for mets, but showed small left cerebellar infarct. He had hemoptysis with heparin, so he was only discharged on ASA 81 mg daily. HF meds were optimized.  He saw Dr Burr Medico last week and was started on Alectinib, which has potential cardiac toxicity, especially bradycardia.   Today he returns for post hospital follow up. Feeling great overall. He denies any recurrent hemoptysis or palpitation. EKG shows NSR today. He has been on Alecensa without difficulty thus far. Following for bradycardia.  He has continue to take amiodarone 400 mg BID. He denies SOB getting around the house. He continues to vape CBD oil on occasion. He is on lasix 80 mg q am and 40 mg q pm, weight up 2 lbs over past 2 weeks.   Review of systems complete and found to be negative unless listed in HPI.   Past Medical History:  Diagnosis Date  . CHF (congestive heart failure) ([place])   . Chronic systolic heart failure ([place]) 04/15/2015    EF 30-35% by echo 2016 , [facility]   . Hypertension   . Lung mass 04/15/2015    Never biopsied. History of PET scan that raised concern.   . Metabolic syndrome 06/15/100  . OSA on CPAP   . Paroxysmal atrial  fibrillation ([place]) 04/15/2015   On apixaban   . Prostate cancer ([place])   . Thrombocytopenia ([place]) 04/15/2015    Current Outpatient Medications  Medication Sig Dispense Refill  . alectinib (ALECENSA) 150 MG capsule Take 600 mg by mouth 2 (two) times daily with a meal.    . amiodarone (PACERONE) 400 MG tablet Take 1 tablet (400 mg total) by mouth 2 (two) times daily. 60 tablet 0  . aspirin 81 MG tablet Take 81 mg by mouth daily.    . Cholecalciferol ([employer] VIT D 5000 HIGH-POTENCY) 5000 units capsule Take 5,000 Units by mouth daily.    . digoxin (LANOXIN) 0.125 MG tablet Take 1 tablet (0.125 mg total) by mouth daily. 30 tablet 0  . furosemide (LASIX) 40 MG tablet Take 1 tablet (40 mg total) by mouth every evening. 30 tablet 0  . furosemide (LASIX) 80 MG tablet Take 1 tablet (80 mg total) by mouth daily with breakfast. 30 tablet 0  . losartan (COZAAR) 25 MG tablet Take 1 tablet (25 mg total) by mouth every evening. 30 tablet 1  . Multiple Vitamin (MULTIVITAMIN) tablet Take 1 tablet by mouth daily.    . pantoprazole (PROTONIX) 40 MG tablet Take 1 tablet (40 mg total) by mouth daily. 30 tablet 0  . potassium chloride SA (K-DUR,KLOR-CON) 20 MEQ tablet Take 2 tablets (40 mEq total) by mouth daily. 30 tablet 0  . spironolactone (ALDACTONE) 25 MG tablet Take 1 tablet (25 mg total) by mouth daily. [place]  tablet 0  . traZODone (DESYREL) 50 MG tablet Take 1 tablet (50 mg total) by mouth at bedtime as needed for sleep. 20 tablet 0   No current facility-administered medications for this encounter.     No Known Allergies    Social History   Socioeconomic History  . Marital status: Married    Spouse name: Not on file  . Number of children: Not on file  . Years of education: Not on file  . Highest education level: Not on file  Occupational History  . Not on file  Social Needs  . Financial resource strain: Not on file  . Food insecurity:    Worry: Not on file    Inability: Not on file  . Transportation  needs:    Medical: Not on file    Non-medical: Not on file  Tobacco Use  . Smoking status: Former Smoker    Years: 6.50    Types: Cigarettes, Cigars, Pipe    Last attempt to quit: 03/14/1974    Years since quitting: 43.6  . Smokeless tobacco: Never Used  Substance and Sexual Activity  . Alcohol use: Yes    Alcohol/week: 0.0 standard drinks    Comment: 09/26/2017 "holidays only"  . Drug use: Never  . Sexual activity: Yes  Lifestyle  . Physical activity:    Days per week: Not on file    Minutes per session: Not on file  . Stress: Not on file  Relationships  . Social connections:    Talks on phone: Not on file    Gets together: Not on file    Attends religious service: Not on file    Active member of club or organization: Not on file    Attends meetings of clubs or organizations: Not on file    Relationship status: Not on file  . Intimate partner violence:    Fear of current or ex partner: Not on file    Emotionally abused: Not on file    Physically abused: Not on file    Forced sexual activity: Not on file  Other Topics Concern  . Not on file  Social History Narrative  . Not on file      Family History  Problem Relation Age of Onset  . Parkinson's disease Mother   . Hypertension Father     Vitals:   10/23/17 1101  BP: 96/62  Pulse: 76  SpO2: 95%  Weight: 98.7 kg (217 lb 9.6 oz)  Height: 5\' 11"  (1.803 m)     PHYSICAL EXAM: General:  Well appearing. No respiratory difficulty HEENT: normal Neck: supple. JVP 6-7 cm. Carotids 2+ bilat; no bruits. No lymphadenopathy or thyromegaly appreciated. Cor: PMI laterally displaced. Irregular rate & rhythm. No rubs, gallops or murmurs. Lungs: clear Abdomen: soft, nontender, nondistended. No hepatosplenomegaly. No bruits or masses. Good bowel sounds. Extremities: no cyanosis, clubbing, rash, edema Neuro: alert & oriented x 3, cranial nerves grossly intact. moves all 4 extremities w/o difficulty. Affect pleasant.  ECG: NSR  77 bpm, personally reviewed.    ASSESSMENT & PLAN:  1. Chronic systolic CHF: -  TEE 5/85 with EF 15%. Long history of nonischemic cardiomyopathy. No ICD. He was admitted with afib/RVR and dyspnea. Failed TEE-guided DCCV and intubated likely due to pulmonary edema.  - NYHA III symptoms chronically - Volume status stable on exam.  - Continue lasix 80 mg q am and 40 mg q pm. BMET today.  - Continue digoxin 0.125 mg daily. Dig level today.  -  Continue losartan 25 mg daily - Continue spironolactone 25 daily.  -Long-term prognosis with combination of metastatic lung cancer and severe CHF(low output) is very poor. He is not a candidate for advanced cardiac therapies.  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 Seen by oncology, not candidate for chemotherapy but may be candidate for immunotherapy depending on genomics.  - MR brain negative for mets.  - Follows with Dr Burr Medico. Started Alectinib last week (potential cardiac toxicity, especially bradycardia). Plan to repeat CT scan in 3 months. GOC discussion had and he wishes to remain full code. 3. Hemoptysis: - Resolved once AC stopped. Likely from lung mass.  - He was put on ASA 81 when small CVA found on MRI, so far no recurrent hemoptysis.  4. Paroxysmal Atrial fibrillation:  - Initially with RVR, likely was driving CHF. He failed DCCV but then with additionofamiodaronehad gone back to NSR on 7/24, buton 7/25 he wasback inafib.  - EKG today shows NSR 77 npm.  - Decrease amio to 200 mg BID -Continue digoxin for rate control - As above, unable to anticoagulate at this time with profuse hemoptysis from lung tumor on anticoagulation. If he tolerates ASA for a couple of weeks, would re-challenge with Eliquis given CVA on MRI.  5. Recent AKI: - Check BMET 6. H/o elevated PSA:  - Has declined workup in past. No change.   7. Recent acute hypoxemic respiratory failure:  - Suspect due to pulmonary edema+ large RLL  mass/hemoptysis.  - H influenzae in sputum, treating with ceftriaxone x 7 days.  - He denies cough, fever, chills, or sweats. 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  Likely due to atrial fibrillation.  He is off anticoagulation due to severe hemoptysis from lung mass.  - He was started on ASA 81.  Will stop ASA today and re-challenge with Eliquis 5 mg BID   RTC 2 weeks. Sooner with symptoms. Labs and Meds as above. RTC 6-8 to see MD after APP follow up.   Shirley Friar, PA-C 10/23/17  Greater than 50% of the 30 minute visit was spent in counseling/coordination of care regarding disease state education, salt/fluid restriction, sliding scale diuretics, and medication compliance.

## 2017-10-23 ENCOUNTER — Ambulatory Visit (HOSPITAL_COMMUNITY)
Admission: RE | Admit: 2017-10-23 | Discharge: 2017-10-23 | Disposition: A | Discharge status: Home / Self Care | Payer: 59 | Source: Ambulatory Visit | Attending: Cardiology | Admitting: Cardiology

## 2017-10-23 ENCOUNTER — Encounter (HOSPITAL_COMMUNITY): Payer: 59

## 2017-10-23 ENCOUNTER — Telehealth (HOSPITAL_COMMUNITY): Payer: Self-pay | Admitting: Cardiology

## 2017-10-23 VITALS — BP 96/62 | HR 76 | Ht 71.0 in | Wt 217.6 lb

## 2017-10-23 DIAGNOSIS — J9601 Acute respiratory failure with hypoxia: Secondary | ICD-10-CM | POA: Diagnosis not present

## 2017-10-23 DIAGNOSIS — G4733 Obstructive sleep apnea (adult) (pediatric): Secondary | ICD-10-CM | POA: Insufficient documentation

## 2017-10-23 DIAGNOSIS — Z79899 Other long term (current) drug therapy: Secondary | ICD-10-CM | POA: Diagnosis not present

## 2017-10-23 DIAGNOSIS — R042 Hemoptysis: Secondary | ICD-10-CM | POA: Diagnosis not present

## 2017-10-23 DIAGNOSIS — I4581 Long QT syndrome: Secondary | ICD-10-CM | POA: Insufficient documentation

## 2017-10-23 DIAGNOSIS — I480 Paroxysmal atrial fibrillation: Secondary | ICD-10-CM | POA: Insufficient documentation

## 2017-10-23 DIAGNOSIS — I5022 Chronic systolic (congestive) heart failure: Secondary | ICD-10-CM | POA: Diagnosis not present

## 2017-10-23 DIAGNOSIS — Z7982 Long term (current) use of aspirin: Secondary | ICD-10-CM | POA: Insufficient documentation

## 2017-10-23 DIAGNOSIS — R972 Elevated prostate specific antigen [PSA]: Secondary | ICD-10-CM | POA: Insufficient documentation

## 2017-10-23 DIAGNOSIS — N179 Acute kidney failure, unspecified: Secondary | ICD-10-CM | POA: Insufficient documentation

## 2017-10-23 DIAGNOSIS — R918 Other nonspecific abnormal finding of lung field: Secondary | ICD-10-CM | POA: Diagnosis not present

## 2017-10-23 DIAGNOSIS — Z87891 Personal history of nicotine dependence: Secondary | ICD-10-CM | POA: Diagnosis not present

## 2017-10-23 DIAGNOSIS — Z7983 Long term (current) use of bisphosphonates: Secondary | ICD-10-CM | POA: Diagnosis not present

## 2017-10-23 DIAGNOSIS — Z8249 Family history of ischemic heart disease and other diseases of the circulatory system: Secondary | ICD-10-CM | POA: Diagnosis not present

## 2017-10-23 DIAGNOSIS — C787 Secondary malignant neoplasm of liver and intrahepatic bile duct: Secondary | ICD-10-CM | POA: Insufficient documentation

## 2017-10-23 DIAGNOSIS — Z8546 Personal history of malignant neoplasm of prostate: Secondary | ICD-10-CM | POA: Diagnosis not present

## 2017-10-23 DIAGNOSIS — Z8673 Personal history of transient ischemic attack (TIA), and cerebral infarction without residual deficits: Secondary | ICD-10-CM | POA: Diagnosis not present

## 2017-10-23 DIAGNOSIS — Z09 Encounter for follow-up examination after completed treatment for conditions other than malignant neoplasm: Secondary | ICD-10-CM | POA: Diagnosis present

## 2017-10-23 DIAGNOSIS — I5082 Biventricular heart failure: Secondary | ICD-10-CM | POA: Diagnosis not present

## 2017-10-23 DIAGNOSIS — I428 Other cardiomyopathies: Secondary | ICD-10-CM | POA: Diagnosis not present

## 2017-10-23 DIAGNOSIS — C3490 Malignant neoplasm of unspecified part of unspecified bronchus or lung: Secondary | ICD-10-CM | POA: Diagnosis not present

## 2017-10-23 DIAGNOSIS — I110 Hypertensive heart disease with heart failure: Secondary | ICD-10-CM | POA: Diagnosis not present

## 2017-10-23 DIAGNOSIS — Z7901 Long term (current) use of anticoagulants: Secondary | ICD-10-CM

## 2017-10-23 LAB — BASIC METABOLIC PANEL
ANION GAP: 8 (ref 5–15)
BUN: 38 mg/dL — ABNORMAL HIGH (ref 8–23)
CHLORIDE: 96 mmol/L — AB (ref 98–111)
CO2: 29 mmol/L (ref 22–32)
Calcium: 9.2 mg/dL (ref 8.9–10.3)
Creatinine, Ser: 1.69 mg/dL — ABNORMAL HIGH (ref 0.61–1.24)
GFR calc non Af Amer: 40 mL/min — ABNORMAL LOW (ref >60)
GFR, EST AFRICAN AMERICAN: 47 mL/min — AB (ref >60)
GLUCOSE: 88 mg/dL (ref 70–99)
Potassium: 4.5 mmol/L (ref 3.5–5.1)
Sodium: 133 mmol/L — ABNORMAL LOW (ref 135–145)

## 2017-10-23 LAB — CBC
HCT: 37 % — ABNORMAL LOW (ref 39.0–52.0)
HEMOGLOBIN: 12.3 g/dL — AB (ref 13.0–17.0)
MCH: 29.3 pg (ref 26.0–34.0)
MCHC: 33.2 g/dL (ref 30.0–36.0)
MCV: 88.1 fL (ref 78.0–100.0)
Platelets: 282 10*3/uL (ref 150–400)
RBC: 4.2 MIL/uL — AB (ref 4.22–5.81)
RDW: 15 % (ref 11.5–15.5)
WBC: 6.2 10*3/uL (ref 4.0–10.5)

## 2017-10-23 LAB — DIGOXIN LEVEL: DIGOXIN LVL: 0.9 ng/mL (ref 0.8–2.0)

## 2017-10-23 MED ORDER — APIXABAN 5 MG PO TABS: 5.0000 mg | ORAL_TABLET | Freq: Two times a day (BID) | ORAL | 6 refills | Status: DC

## 2017-10-23 MED ORDER — DIGOXIN 125 MCG PO TABS: 0.1250 mg | ORAL_TABLET | Freq: Every day | ORAL | 2 refills | Status: DC

## 2017-10-23 MED ORDER — FUROSEMIDE 40 MG PO TABS: 40.0000 mg | ORAL_TABLET | Freq: Every evening | ORAL | 2 refills | Status: DC

## 2017-10-23 MED ORDER — AMIODARONE HCL 200 MG PO TABS: 200.0000 mg | ORAL_TABLET | Freq: Two times a day (BID) | ORAL | 3 refills | Status: DC

## 2017-10-23 MED ORDER — LOSARTAN POTASSIUM 25 MG PO TABS: 25.0000 mg | ORAL_TABLET | Freq: Every evening | ORAL | 2 refills | Status: DC

## 2017-10-23 MED ORDER — FUROSEMIDE 40 MG PO TABS: 40.0000 mg | ORAL_TABLET | Freq: Two times a day (BID) | ORAL | 2 refills | Status: DC

## 2017-10-23 MED ORDER — FUROSEMIDE 80 MG PO TABS: 80.0000 mg | ORAL_TABLET | Freq: Every day | ORAL | 2 refills | Status: DC

## 2017-10-23 NOTE — Telephone Encounter (Signed)
Notes recorded by Kerry Dory, CMA on 10/23/2017 at 4:01 PM EDT Patient aware. Patient voiced understanding, repeat labs 8/20  ------  Notes recorded by Shirley Friar, PA-C on 10/23/2017 at 2:50 PM EDT Creatinine elevated. Please have him hold one dose of lasix, then decrease to 40 mg BID.  Needs repeat in 1 weeks, with repeat digoxin level as well. Please have him hold dixogin that am.    Lollie Marrow, PA-C 10/23/2017 2:50 PM

## 2017-10-23 NOTE — Patient Instructions (Signed)
Stop Aspirin  Start Eliquis 5 mg Twice daily   Decrease Amiodarone to 200 mg Twice daily   Labs today  Your physician recommends that you schedule a follow-up appointment in: 2 weeks   Your physician recommends that you schedule a follow-up appointment in: 2 months with Dr Aundra Dubin

## 2017-10-23 NOTE — Addendum Note (Signed)
Addended by: Kerry Dory on: 10/23/2017 04:06 PM   Modules accepted: Orders

## 2017-10-24 ENCOUNTER — Telehealth: Payer: Self-pay

## 2017-10-24 NOTE — Telephone Encounter (Signed)
Oral Oncology Patient Advocate Encounter  Confirmed with [place] that Logan [name] was picked up on 10/20/17  [place] Patient [name] Phone [phone] Fax [phone]

## 2017-10-25 ENCOUNTER — Telehealth: Payer: Self-pay | Admitting: Nurse Practitioner

## 2017-10-25 NOTE — Telephone Encounter (Signed)
Home health RN is calling to seek orders for home health. I told the RN that I was not sure Dr Tamala Julian or Truitt Merle wrote orders for home health, but I would forward her request to Dr Thompson Caul RN. In the meantime, I suggested the home health RN call pt's oncologist to see if they would approve orders as well.

## 2017-10-25 NOTE — Telephone Encounter (Signed)
New Message:       Mechele Claude from [facility] home [facility] is wanting orders to visit the pt's home for 1x 1 week for this week and then 2x a week for the next 2 wks and 2 prn visits.

## 2017-10-26 NOTE — Telephone Encounter (Signed)
[facility] as requested.

## 2017-10-26 NOTE — Telephone Encounter (Signed)
Spoke with Mechele Claude and let her know ok to approve orders.

## 2017-10-31 ENCOUNTER — Ambulatory Visit (HOSPITAL_COMMUNITY)
Admission: RE | Admit: 2017-10-31 | Discharge: 2017-10-31 | Disposition: A | Discharge status: Home / Self Care | Payer: 59 | Source: Ambulatory Visit | Attending: Internal Medicine | Admitting: Internal Medicine

## 2017-10-31 DIAGNOSIS — I5022 Chronic systolic (congestive) heart failure: Secondary | ICD-10-CM | POA: Diagnosis present

## 2017-10-31 LAB — DIGOXIN LEVEL: Digoxin Level: 0.5 ng/mL — ABNORMAL LOW (ref 0.8–2.0)

## 2017-10-31 LAB — BASIC METABOLIC PANEL
Anion gap: 8 (ref 5–15)
BUN: 42 mg/dL — ABNORMAL HIGH (ref 8–23)
CHLORIDE: 97 mmol/L — AB (ref 98–111)
CO2: 28 mmol/L (ref 22–32)
CREATININE: 1.93 mg/dL — AB (ref 0.61–1.24)
Calcium: 9.3 mg/dL (ref 8.9–10.3)
GFR calc Af Amer: 40 mL/min — ABNORMAL LOW (ref >60)
GFR calc non Af Amer: 34 mL/min — ABNORMAL LOW (ref >60)
Glucose, Bld: 112 mg/dL — ABNORMAL HIGH (ref 70–99)
POTASSIUM: 4.5 mmol/L (ref 3.5–5.1)
SODIUM: 133 mmol/L — AB (ref 135–145)

## 2017-11-01 ENCOUNTER — Telehealth (HOSPITAL_COMMUNITY): Payer: Self-pay | Admitting: Cardiology

## 2017-11-01 ENCOUNTER — Encounter (HOSPITAL_COMMUNITY): Payer: Self-pay | Admitting: Cardiology

## 2017-11-01 MED ORDER — FUROSEMIDE 40 MG PO TABS: 40.0000 mg | ORAL_TABLET | Freq: Every day | ORAL | 2 refills | Status: DC

## 2017-11-01 NOTE — Telephone Encounter (Signed)
Notes recorded by Kerry Dory, CMA on 11/01/2017 at 3:30 PM EDT At the request of the patient, a message with instructions was sent thru MyChart ------  Notes recorded by Kerry Dory, CMA on 11/01/2017 at 3:25 PM EDT Patient aware. Patient voiced understanding.   ------  Notes recorded by Shirley Friar, PA-C on 11/01/2017 at 2:15 PM EDT Please call today. ------  Notes recorded by Shirley Friar, PA-C on 10/31/2017 at 9:53 AM EDT Continue digoxin at current dose.   Hold lasix x 2 days. Resume at 40 mg daily on 11/02/17. Can take extra 40 mg in evening as needed.   Keep 1 week f/u.   Legrand Como [address] [place], PA-C 10/31/2017 9:53 AM

## 2017-11-02 ENCOUNTER — Ambulatory Visit: Payer: Self-pay | Admitting: *Deleted

## 2017-11-03 ENCOUNTER — Other Ambulatory Visit: Payer: Self-pay | Admitting: *Deleted

## 2017-11-03 NOTE — Patient Outreach (Signed)
[facility]) Care Management  11/03/2017  Logan [name] 07/04/1950 [id number]  EMMI-Stroke follow up call.  Telephone call to patient; left HIPPA compliant voice mail requesting return call.   Plan: Will follow up 2-4 business days.   Sherrin Daisy, RN BSN [place] Management Coordinator [facility] Care Management  [phone]

## 2017-11-06 ENCOUNTER — Telehealth: Payer: Self-pay | Admitting: Pharmacist

## 2017-11-06 NOTE — Telephone Encounter (Signed)
Oral Oncology Pharmacist Encounter  Received voicemail from patient's wife, [name], with questions about continued Alecensa acquisition. As discussed during initial counseling session first fill of Alecensa would be performed at the [facility]. We had acquired a co-pay card to ensure the medication was affordable and patient and wife were instructed at that time to provide co-pay billing information to [facility] once it was time for Alecensa refills.  Alecensa prescription processed at the [facility] outpatient pharmacy on 10/20/2017. Prescription for Alecensa refills will be sent to [facility] per insurance requirement once it is able to be processed by pharmacy (approximately 3 weeks after first fill).  Refill of Alecensa will be E scribed to [facility] tomorrow (11/07/2017) in an effort to ensure the pharmacy is able to process the prescription without getting a rejection for fill coming to early.  I called and left voicemail for patient and wife on patient secure voicemail. Phone number to oral oncology clinic provided in case they have any additional questions or concerns.  Johny Drilling, PharmD, BCPS, BCOP  11/06/2017 9:17 AM Oral Oncology Clinic [phone]

## 2017-11-07 ENCOUNTER — Telehealth: Payer: Self-pay | Admitting: Pharmacist

## 2017-11-07 ENCOUNTER — Encounter (HOSPITAL_COMMUNITY): Payer: 59

## 2017-11-07 DIAGNOSIS — C3491 Malignant neoplasm of unspecified part of right bronchus or lung: Secondary | ICD-10-CM

## 2017-11-07 MED ORDER — ALECTINIB HCL 150 MG PO CAPS: 600.0000 mg | ORAL_CAPSULE | Freq: Two times a day (BID) | ORAL | 1 refills | Status: DC

## 2017-11-07 NOTE — Telephone Encounter (Signed)
Oral Oncology Pharmacist Encounter  Alecensa prescription e-scribed to [place] per insurance requirement.  Johny Drilling, PharmD, BCPS, BCOP  11/07/2017 9:15 AM Oral Oncology Clinic [phone]

## 2017-11-08 ENCOUNTER — Inpatient Hospital Stay: Payer: 59

## 2017-11-08 ENCOUNTER — Inpatient Hospital Stay (HOSPITAL_BASED_OUTPATIENT_CLINIC_OR_DEPARTMENT_OTHER): Payer: 59 | Admitting: Nurse Practitioner

## 2017-11-08 ENCOUNTER — Telehealth: Payer: Self-pay

## 2017-11-08 ENCOUNTER — Encounter: Payer: Self-pay | Admitting: Nurse Practitioner

## 2017-11-08 VITALS — BP 98/53 | HR 72 | Temp 97.9°F | Resp 18 | Ht 71.0 in | Wt 226.3 lb

## 2017-11-08 DIAGNOSIS — C3431 Malignant neoplasm of lower lobe, right bronchus or lung: Secondary | ICD-10-CM

## 2017-11-08 DIAGNOSIS — C61 Malignant neoplasm of prostate: Secondary | ICD-10-CM | POA: Diagnosis not present

## 2017-11-08 DIAGNOSIS — J90 Pleural effusion, not elsewhere classified: Secondary | ICD-10-CM

## 2017-11-08 DIAGNOSIS — C3491 Malignant neoplasm of unspecified part of right bronchus or lung: Secondary | ICD-10-CM

## 2017-11-08 DIAGNOSIS — I4891 Unspecified atrial fibrillation: Secondary | ICD-10-CM

## 2017-11-08 DIAGNOSIS — C787 Secondary malignant neoplasm of liver and intrahepatic bile duct: Secondary | ICD-10-CM

## 2017-11-08 DIAGNOSIS — I110 Hypertensive heart disease with heart failure: Secondary | ICD-10-CM

## 2017-11-08 LAB — CBC WITH DIFFERENTIAL/PLATELET
BASOS ABS: 0.1 10*3/uL (ref 0.0–0.1)
BASOS PCT: 1 %
EOS ABS: 0.1 10*3/uL (ref 0.0–0.5)
Eosinophils Relative: 1 %
HEMATOCRIT: 32.9 % — AB (ref 38.4–49.9)
Hemoglobin: 11 g/dL — ABNORMAL LOW (ref 13.0–17.1)
Lymphocytes Relative: 39 %
Lymphs Abs: 2.5 10*3/uL (ref 0.9–3.3)
MCH: 29.3 pg (ref 27.2–33.4)
MCHC: 33.4 g/dL (ref 32.0–36.0)
MCV: 87.7 fL (ref 79.3–98.0)
MONO ABS: 0.4 10*3/uL (ref 0.1–0.9)
MONOS PCT: 6 %
NEUTROS ABS: 3.4 10*3/uL (ref 1.5–6.5)
Neutrophils Relative %: 53 %
PLATELETS: 160 10*3/uL (ref 140–400)
RBC: 3.74 MIL/uL — ABNORMAL LOW (ref 4.20–5.82)
RDW: 15.4 % — AB (ref 11.0–14.6)
WBC: 6.4 10*3/uL (ref 4.0–10.3)

## 2017-11-08 LAB — COMPREHENSIVE METABOLIC PANEL
ALBUMIN: 4.1 g/dL (ref 3.5–5.0)
ALT: 29 U/L (ref 0–44)
AST: 26 U/L (ref 15–41)
Alkaline Phosphatase: 93 U/L (ref 38–126)
Anion gap: 10 (ref 5–15)
BUN: 23 mg/dL (ref 8–23)
CHLORIDE: 98 mmol/L (ref 98–111)
CO2: 29 mmol/L (ref 22–32)
Calcium: 9.3 mg/dL (ref 8.9–10.3)
Creatinine, Ser: 1.62 mg/dL — ABNORMAL HIGH (ref 0.61–1.24)
GFR calc Af Amer: 49 mL/min — ABNORMAL LOW (ref >60)
GFR calc non Af Amer: 42 mL/min — ABNORMAL LOW (ref >60)
GLUCOSE: 149 mg/dL — AB (ref 70–99)
POTASSIUM: 3.7 mmol/L (ref 3.5–5.1)
SODIUM: 137 mmol/L (ref 135–145)
TOTAL PROTEIN: 8.1 g/dL (ref 6.5–8.1)
Total Bilirubin: 0.4 mg/dL (ref 0.3–1.2)

## 2017-11-08 LAB — CK: CK TOTAL: 262 U/L (ref 49–397)

## 2017-11-08 MED ORDER — POTASSIUM CHLORIDE CRYS ER 20 MEQ PO TBCR: 40.0000 meq | EXTENDED_RELEASE_TABLET | Freq: Every day | ORAL | 0 refills | Status: DC

## 2017-11-08 NOTE — Telephone Encounter (Signed)
Printed  avs and calender of upcoming appointment. Per 8/28 los 

## 2017-11-08 NOTE — Progress Notes (Signed)
[place]  [phone] [phone]  Clinic Follow up Note   Patient Care Team: [name], MD as PCP - General (Family Medicine) [name], MD as PCP - Cardiology (Cardiology) [name], MD as Consulting Physician (Cardiology) 11/08/2017  SUMMARY OF ONCOLOGIC HISTORY: Oncology History   Cancer Staging Non-small cell lung cancer (NSCLC) [facility]) Staging form: Lung, AJCC 8th Edition - Clinical stage from 10/02/2017: Stage IV (cT3, cN2, cM1c) - Signed by Truitt Merle, MD on 10/20/2017      Non-small cell lung cancer (NSCLC) ([place])   04/01/2013 Imaging    04/01/2013 CXR IMPRESSION: 1. Cardiomegaly with diffuse increased interstitial markings consistent with pulmonary edema. 2. 5-6 cm rounded density seen in the right lower lobe medially. This could be related to an area of consolidation correlate clinically for pneumonia.. But the rounded and well-defined borders are suspicious for mass. Followup x-ray suggested after  treating for the pulmonary edema/pneumonia to see if this resolves.    04/30/2014 Imaging    04/30/2014 CXR IMPRESSION: Enlarging right lower lobe nodule. Would recommend PET CT to further evaluate.    01/20/2015 Imaging    01/20/2015 CXR IMPRESSION: Significant improvement comparatively, now with smaller pleural effusions and resolving pulmonary edema. Known right lower lobe mass suspicious for bronchogenic carcinoma is redemonstrated.    04/07/2015 PET scan    04/07/2015 PET Scan IMPRESSION:  Hypermetabolic right lower lobe mass Hypermetabolic left adrenal nodule.    08/21/2015 Imaging    08/21/2015 CXR IMPRESSION: Right lower lobe mass.    09/22/2017 Imaging    09/22/2017 CXR IMPRESSION: 1.Moderate right pleural effusion with right lower/middle lobe consolidation. Minimal patchy opacities in left lower lobe. 2.Cardiomegaly.    09/26/2017 Imaging    09/26/2017 CT Chest WO Contrast IMPRESSION: 1. Large RIGHT lower  lobe mass with central and peripheral calcifications occupies the near entirety of the RIGHT lower lobe and concerning for neoplasm. 2. Moderate RIGHT effusion. 3. Mild mediastinal and RIGHT hilar adenopathy. 4. Recommend FDG PET scan for biopsy planning/staging    09/26/2017 Tumor Marker     Ref. Range 09/26/2017 21:31  Prostatic Specific Antigen Latest Ref Range: 0.00 - 4.00 ng/mL 84.83 (H)       09/27/2017 Imaging    09/27/2017 CT AP W WO Contrast IMPRESSION: 1. Large complex masses in the right lower lobe of the lung and in the right hepatic lobe. Smaller liver lesions in the left hepatic lobe. Nonspecific lesions in the spleen and kidneys. No significant pathologic adenopathy. This would not be a characteristic metastatic pattern for prostate cancer and is not a specific metastatic and imaging pattern to suggest an individual etiology. Possibilities might include metastatic lung cancer, melanoma, metastatic hepatocellular carcinoma, or a variety of other possibilities. Tissue diagnosis recommended. 2. Moderate prostatomegaly. 3. Scattered mild ascites. 4. Moderate cardiomegaly.  Small inferior pericardial effusion. 5. Trace bilateral pleural effusions.    10/02/2017 Pathology Results    10/02/2017 Surgical Pathology Diagnosis Liver, needle/core biopsy, Left Hepatic Lobe - METASTATIC LUNG ADENOCARCINOMA TO THE LIVER. SEE NOTE    10/02/2017 Cancer Staging    Staging form: Lung, AJCC 8th Edition - Clinical stage from 10/02/2017: Stage IV (cT3, cN2, cM1c) - Signed by Truitt Merle, MD on 10/20/2017    10/04/2017 Initial Diagnosis    Non-small cell lung cancer (NSCLC) ([name])   CURRENT THERAPY: Alectinib '600mg'$  bid, began 10/22/17   INTERVAL HISTORY: [name] returns with his wife for lab and f/u as scheduled. He began Alectinib  BID on 8/11. He feels well, with good energy level and appetite. He remains active and goes to the gym. He gets occasional leg aches and cramps after  prolonged activity, resolves quickly with rest. No swelling in legs. He manages constipation with prunes and diet. He had a BM today, no blood in stool. Denies n/v/d. Denies hematuria or dysuria. Denies fever, chills, cough, chest pain, dyspnea, or skin rash. He denies palpitation or slow heart beat, he changes position slowly.    MEDICAL HISTORY:  Past Medical History:  Diagnosis Date  . CHF (congestive heart failure) ([place])   . Chronic systolic heart failure ([place]) 04/15/2015    EF 30-35% by echo 2016 , [facility]   . Hypertension   . Lung mass 04/15/2015    Never biopsied. History of PET scan that raised concern.   . Metabolic syndrome 6/0/6301  . OSA on CPAP   . Paroxysmal atrial fibrillation ([place]) 04/15/2015   On apixaban   . Prostate cancer ([place])   . Thrombocytopenia ([place]) 04/15/2015    SURGICAL HISTORY: Past Surgical History:  Procedure Laterality Date  . CARDIAC CATHETERIZATION N/A 04/27/2015   Procedure: Right/Left Heart Cath and Coronary Angiography;  Surgeon: [name], MD;  Location: [place] [facility];  Service: Cardiovascular;  Laterality: N/A;  . CARDIOVERSION N/A 09/29/2017   Procedure: CARDIOVERSION;  Surgeon: Pixie Casino, MD;  Location: [place];  Service: Cardiovascular;  Laterality: N/A;  . IR THORACENTESIS ASP PLEURAL SPACE W/IMG GUIDE  09/27/2017  . PROSTATE BIOPSY    . TEE WITHOUT CARDIOVERSION N/A 09/29/2017   Procedure: TRANSESOPHAGEAL ECHOCARDIOGRAM (TEE);  Surgeon: Pixie Casino, MD;  Location: [facility] ENDOSCOPY;  Service: Cardiovascular;  Laterality: N/A;    I have reviewed the social history and family history with the patient and they are unchanged from previous note.  ALLERGIES:  has No Known Allergies.  MEDICATIONS:  Current Outpatient Medications  Medication Sig Dispense Refill  . alectinib (ALECENSA) 150 MG capsule Take 4 capsules (600 mg total) by mouth 2 (two) times daily with a meal. 240 capsule 1  . amiodarone (PACERONE) 200 MG tablet Take 1  tablet (200 mg total) by mouth 2 (two) times daily. 180 tablet 3  . apixaban (ELIQUIS) 5 MG TABS tablet Take 1 tablet (5 mg total) by mouth 2 (two) times daily. 60 tablet 6  . Cholecalciferol ([employer] VIT D 5000 HIGH-POTENCY) 5000 units capsule Take 5,000 Units by mouth daily.    . digoxin (LANOXIN) 0.125 MG tablet Take 1 tablet (0.125 mg total) by mouth daily. 90 tablet 2  . furosemide (LASIX) 40 MG tablet Take 1 tablet (40 mg total) by mouth daily. 60 tablet 2  . losartan (COZAAR) 25 MG tablet Take 1 tablet (25 mg total) by mouth every evening. 90 tablet 2  . Multiple Vitamin (MULTIVITAMIN) tablet Take 1 tablet by mouth daily.    . potassium chloride SA (K-DUR,KLOR-CON) 20 MEQ tablet Take 2 tablets (40 mEq total) by mouth daily. 30 tablet 0  . spironolactone (ALDACTONE) 25 MG tablet Take 1 tablet (25 mg total) by mouth daily. 30 tablet 0  . traZODone (DESYREL) 50 MG tablet Take 1 tablet (50 mg total) by mouth at bedtime as needed for sleep. 20 tablet 0  . pantoprazole (PROTONIX) 40 MG tablet Take 1 tablet (40 mg total) by mouth daily. 30 tablet 0   No current facility-administered medications for this visit.     PHYSICAL EXAMINATION: ECOG PERFORMANCE STATUS: 1 - Symptomatic but  completely ambulatory  Vitals:   11/08/17 1430 11/08/17 1448  BP: (!) 98/53 (!) 98/53  Pulse: 72 72  Resp: 18 18  Temp: 97.9 F (36.6 C) 97.9 F (36.6 C)  SpO2: 100% 100%   Filed Weights   11/08/17 1430 11/08/17 1448  Weight: 226 lb 4.8 oz (102.6 kg) 226 lb 4.8 oz (102.6 kg)    GENERAL:alert, no distress and comfortable SKIN: skin color, texture, turgor are normal, no rashes or significant lesions EYES: sclera clear OROPHARYNX:no thrush or ulcers LYMPH:  no palpable cervical or supraclavicular lymphadenopathy LUNGS: distant breath sounds with normal breathing effort HEART: regular rate & rhythm, no lower extremity edema ABDOMEN:abdomen soft, non-tender and normal bowel sounds Musculoskeletal:no cyanosis  of digits and no clubbing  NEURO: alert & oriented x 3 with fluent speech, no focal motor/sensory deficits  LABORATORY DATA:  I have reviewed the data as listed CBC Latest Ref Rng & Units 11/08/2017 10/23/2017 10/12/2017  WBC 4.0 - 10.3 K/uL 6.4 6.2 8.2  Hemoglobin 13.0 - 17.1 g/dL 11.0(L) 12.3(L) 10.2(L)  Hematocrit 38.4 - 49.9 % 32.9(L) 37.0(L) 30.7(L)  Platelets 140 - 400 K/uL 160 282 331     CMP Latest Ref Rng & Units 11/08/2017 10/31/2017 10/23/2017  Glucose 70 - 99 mg/dL 149(H) 112(H) 88  BUN 8 - 23 mg/dL 23 42(H) 38(H)  Creatinine 0.61 - 1.24 mg/dL 1.62(H) 1.93(H) 1.69(H)  Sodium 135 - 145 mmol/L 137 133(L) 133(L)  Potassium 3.5 - 5.1 mmol/L 3.7 4.5 4.5  Chloride 98 - 111 mmol/L 98 97(L) 96(L)  CO2 22 - 32 mmol/L '29 28 29  '$ Calcium 8.9 - 10.3 mg/dL 9.3 9.3 9.2  Total Protein 6.5 - 8.1 g/dL 8.1 - -  Total Bilirubin 0.3 - 1.2 mg/dL 0.4 - -  Alkaline Phos 38 - 126 U/L 93 - -  AST 15 - 41 U/L 26 - -  ALT 0 - 44 U/L 29 - -      RADIOGRAPHIC STUDIES: I have personally reviewed the radiological images as listed and agreed with the findings in the report. No results found.   ASSESSMENT & PLAN: Logan [name] Adegboyegais a 67 y.o.malewho presented to the ED on 09/26/17 for worsening SOB and leg swelling. He has a h/o a lung mass since 2015 that has not been evaluatedand H/o prostate cancer onobservation.  1. 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 H/o untreated prostate cancer, with rising PSA 3. Bilateral pleural effusions, R>L, likely related to CHF  4. Cardiomyopathy, with reduced EF 10-15% 5. Afib, previously on xarelto but discontinued due to hemoptysis, now on aspirin, amio, digoxin, and losartan per cardiology  6. Incidental stroke, left cerebellar small infarct  7. Goals of care - full code   Mr. Deweese appears stable. He began alectinib 600 mg BID on 10/22/17. He is tolerating well overall, without GI toxicity, skin  rash, or bradycardia. He has mild muscle cramps, resolves quickly with rest. CK is pending today. Other labs stable. Cr stable. I encouraged him to hydrate well. I refilled potassium. I recommend he continue alectinib at current dose. We reviewed to continue well rounded diet and physical activity. He will return for lab and f/u in 4 weeks. Plan to restage in 01/2018, after 3 months on therapy.   PLAN -Labs reviewed, CK pending  -Continue alectinib 600 mg BID, refill sent per Johny Drilling on 8/27 -Return for lab and f/u in 4 weeks  -Refilled potassium  All  questions were answered. The patient knows to call the clinic with any problems, questions or concerns. No barriers to learning was detected. I spent 20 minutes counseling the patient face to face. The total time spent in the appointment was 25 minutes and more than 50% was on counseling and review of test results     Alla Feeling, NP 11/08/17

## 2017-11-10 ENCOUNTER — Other Ambulatory Visit: Payer: Self-pay | Admitting: *Deleted

## 2017-11-10 NOTE — Patient Outreach (Signed)
[place] [facility]) Care Management  11/10/2017  Logan [name] 05-10-1950 [id number]  Follow up call-EMMI-Stroke:  Telephone call to patient; spouse/caregiver took call for patient & gave HIPPA verification.  Spouse states patient is doing well and has not had any stroke symptoms since last hospital stay. Voices that patient has had follow visit with neurologist and is involved with the heart failure clinic.  No further concerns at this time.  Plan: Case closure>  Sherrin Daisy, RN BSN CCM Care Management Coordinator [facility] Care Management  [phone]    .

## 2017-11-14 ENCOUNTER — Telehealth (HOSPITAL_COMMUNITY): Payer: Self-pay | Admitting: Surgery

## 2017-11-14 ENCOUNTER — Other Ambulatory Visit (HOSPITAL_COMMUNITY): Payer: Self-pay

## 2017-11-14 MED ORDER — SPIRONOLACTONE 25 MG PO TABS: 25.0000 mg | ORAL_TABLET | Freq: Every day | ORAL | 0 refills | Status: DC

## 2017-11-14 MED ORDER — PANTOPRAZOLE SODIUM 40 MG PO TBEC: 40.0000 mg | DELAYED_RELEASE_TABLET | Freq: Every day | ORAL | 3 refills | Status: DC

## 2017-11-14 NOTE — Telephone Encounter (Signed)
I spoke with patient's wife [place].  She is concerned that patient has gained weight lately--up to 222 lbs.  She denies any increase in swelling or SOB.  He is active and is actually not home currently.  She is requesting an earlier appointment secondary to another appt they scheduled on Friday.  I did say that it would be ok for them to come a bit early to his scheduled appt Friday--10:30AM in order to expedite and help him make the other scheduled appt.

## 2017-11-16 NOTE — Progress Notes (Signed)
Advanced Heart Failure Clinic Note   Referring Physician: PCP: Logan Keens, MD PCP-Cardiologist: Logan Grooms, MD  HF: Dr Logan [name]  HPI: [name] is 67 year old with history of HTN, A Fib, OSA, untreated prostate cancer 2012 , RLL mass 2015 declined treatment, hemoptysis for the last 6 months (he stopped xarelto), and chronic systolic heart failure.   Admitted 1/19-03/17/76 with A/C systolic HF. Required thoracentesis 7/17. Echo showed EF 15% with biventricular failure. Course complicated by Afib RVR and associated flash pulmonary edema. Required intubation and pressors. Diuresed with IV lasix. Oncology consulted for lung cancer (RLL) with liver mets. He is not a candidate for chemotherapy, but might be candidate for immunotherapy depending on genomics. MRI brain was negative for mets, but showed small left cerebellar infarct. He had hemoptysis with heparin, so he was only discharged on ASA 81 mg daily. HF meds were optimized.  He saw Dr Logan [name] recently and was started on Alectinib, which has potential cardiac toxicity, especially bradycardia.   Today he returns for HF follow up with his wife. Last visit, amio was decreased to 200 mg BID. Lasix was also decreased from 80/40 to 40 mg daily due to AKI. ASA was stopped and he was restarted on Eliquis 5 mg BID. Overall doing fine. He is taking 80 mg lasix daily with additional 40 mg PRN. Denies SOB, orthopnea, or edema. No CP. Occasional dizziness when he doesn't eat enough. Appetite and energy level are great. No bleeding with resumption of Eliquis. No longer wearing O2. Wearing CPAP qHS. Limits fluid and salt. Wife cooks all meals. Weights stable at home. SBP 110s, HR 70-80s. Has taken extra lasix twice since last visit. He has not been taking digoxin, spiro, or potassium for the last 2 weeks because he ran out of refills.   Review of systems complete and found to be negative unless listed in HPI.   Past Medical History:  Diagnosis Date    . CHF (congestive heart failure) ([place])   . Chronic systolic heart failure ([place]) 04/15/2015    EF 30-35% by echo 2016 , [facility]   . Hypertension   . Lung mass 04/15/2015    Never biopsied. History of PET scan that raised concern.   . Metabolic syndrome 04/23/5619  . OSA on CPAP   . Paroxysmal atrial fibrillation ([place]) 04/15/2015   On apixaban   . Prostate cancer ([place])   . Thrombocytopenia ([place]) 04/15/2015    Current Outpatient Medications  Medication Sig Dispense Refill  . alectinib (ALECENSA) 150 MG capsule Take 4 capsules (600 mg total) by mouth 2 (two) times daily with a meal. 240 capsule 1  . amiodarone (PACERONE) 200 MG tablet Take 1 tablet (200 mg total) by mouth daily. 90 tablet 3  . apixaban (ELIQUIS) 5 MG TABS tablet Take 1 tablet (5 mg total) by mouth 2 (two) times daily. 60 tablet 6  . Cholecalciferol (CVS VIT D 5000 HIGH-POTENCY) 5000 units capsule Take 5,000 Units by mouth daily.    . furosemide (LASIX) 80 MG tablet Take 80 mg by mouth daily. Take 40 mg in pm as needed for weight gain/swelling    . losartan (COZAAR) 25 MG tablet Take 1 tablet (25 mg total) by mouth every evening. 90 tablet 2  . Multiple Vitamin (MULTIVITAMIN) tablet Take 1 tablet by mouth daily.    . traZODone (DESYREL) 50 MG tablet Take 1 tablet (50 mg total) by mouth at bedtime as needed for sleep. 20 tablet 0  .  digoxin (LANOXIN) 0.125 MG tablet Take 1 tablet (0.125 mg total) by mouth daily. 90 tablet 3  . spironolactone (ALDACTONE) 25 MG tablet Take 1 tablet (25 mg total) by mouth daily. 90 tablet 3   No current facility-administered medications for this encounter.     No Known Allergies    Social History   Socioeconomic History  . Marital status: Married    Spouse name: Not on file  . Number of children: Not on file  . Years of education: Not on file  . Highest education level: Not on file  Occupational History  . Not on file  Social Needs  . Financial resource strain: Not on file  . Food  insecurity:    Worry: Not on file    Inability: Not on file  . Transportation needs:    Medical: Not on file    Non-medical: Not on file  Tobacco Use  . Smoking status: Former Smoker    Years: 6.50    Types: Cigarettes, Cigars, Pipe    Last attempt to quit: 03/14/1974    Years since quitting: 43.7  . Smokeless tobacco: Never Used  Substance and Sexual Activity  . Alcohol use: Yes    Alcohol/week: 0.0 standard drinks    Comment: 09/26/2017 "holidays only"  . Drug use: Never  . Sexual activity: Yes  Lifestyle  . Physical activity:    Days per week: Not on file    Minutes per session: Not on file  . Stress: Not on file  Relationships  . Social connections:    Talks on phone: Not on file    Gets together: Not on file    Attends religious service: Not on file    Active member of club or organization: Not on file    Attends meetings of clubs or organizations: Not on file    Relationship status: Not on file  . Intimate partner violence:    Fear of current or ex partner: Not on file    Emotionally abused: Not on file    Physically abused: Not on file    Forced sexual activity: Not on file  Other Topics Concern  . Not on file  Social History Narrative  . Not on file      Family History  Problem Relation Age of Onset  . Parkinson's disease Mother   . Hypertension Father     Vitals:   11/17/17 0940  BP: 92/60  Pulse: 78  SpO2: 98%  Weight: 103.9 kg (229 lb)   Wt Readings from Last 3 Encounters:  11/17/17 103.9 kg (229 lb)  11/08/17 102.6 kg (226 lb 4.8 oz)  10/23/17 98.7 kg (217 lb 9.6 oz)    PHYSICAL EXAM: General: Well appearing. No resp difficulty. HEENT: Normal Neck: Supple. JVP flat. Carotids 2+ bilat; no bruits. No thyromegaly or nodule noted. Cor: PMI nondisplaced. RRR, No M/G/R noted Lungs: CTAB, normal effort. Abdomen: Soft, non-tender, non-distended, no HSM. No bruits or masses. +BS  Extremities: No cyanosis, clubbing, or rash. R and LLE no edema.    Neuro: Alert & orientedx3, cranial nerves grossly intact. moves all 4 extremities w/o difficulty. Affect pleasant  EKG: NSR with 1st degree AV block, 67 bpm.   ASSESSMENT & PLAN:  1. Chronic systolic CHF: -  TEE 3/29 with EF 15%. Long history of nonischemic cardiomyopathy. No ICD. He was admitted with afib/RVR and dyspnea. Failed TEE-guided DCCV and intubated likely due to pulmonary edema.  - NYHA III symptoms chronically - Volume  status stable - Continue lasix 80 mg daily. Can take additional 40 mg pm PRN - Restart digoxin 0.125 mg daily. Dig level 0.5 10/31/17  - Continue losartan 25 mg daily - Restart spiro 12.5 mg daily. BMET today and in 7-10 days -Long-term prognosis with combination of metastatic lung cancer and severe CHF(low output) is very poor. He is not a candidate for advanced cardiac therapies.  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 Seen by oncology, not candidate for chemotherapy but may be candidate for immunotherapy depending on genomics.  - MR brain negative for mets.  - Follows with Dr Logan [name]. Started Alectinib last week (potential cardiac toxicity, especially bradycardia). Plan to repeat CT scan in 3 months. GOC discussion had and he wishes to remain full code. - HR stable. He checks at home and it is 70-80s. Decrease amio as below 3. Hemoptysis: - Resolved once AC stopped. Likely from lung mass.  - He was put on ASA 81 when small CVA found on MRI, so far no recurrent hemoptysis.  - Now back on Eliquis. Denies further hemoptysis. 4. Paroxysmal Atrial fibrillation:  - Initially with RVR, likely was driving CHF. He failed DCCV but then with additionofamiodaronehad gone back to NSR on 7/24, buton 7/25 he wasback inafib.  - EKG today shows NSR - Decrease amio to 200 mg daily -Restart dig as above - Continue Eliquis 5 mg BID. Denies bleeding. Hemoglobin stable 8/28 at 11.0 5. Recent AKI: - Check BMET today.  6. H/o elevated PSA:  -  Has declined workup in past. No change.   7. Recent acute hypoxemic respiratory failure:  - Suspect due to pulmonary edema+ large RLL mass/hemoptysis.  - Denies recent symptoms. - Now off O2. 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  Likely due to atrial fibrillation.  He is off anticoagulation due to severe hemoptysis from lung mass.  - Continue Eliquis 5 mg BID. Denies bleeding.  Decrease amio to 200 mg daily Restart spiro 12.5 mg daily Restart digoxin 0.125 mg daily BMET today and in 7-10 days Keep f/u with Dr Logan [name] 10/8   Logan Shore, NP 11/17/17  Greater than 50% of the 25 minute visit was spent in counseling/coordination of care regarding disease state education, salt/fluid restriction, sliding scale diuretics, and medication compliance.

## 2017-11-17 ENCOUNTER — Ambulatory Visit (HOSPITAL_COMMUNITY)
Admission: RE | Admit: 2017-11-17 | Discharge: 2017-11-17 | Disposition: A | Discharge status: Home / Self Care | Payer: 59 | Source: Ambulatory Visit | Attending: Cardiology | Admitting: Cardiology

## 2017-11-17 ENCOUNTER — Encounter (HOSPITAL_COMMUNITY): Payer: Self-pay

## 2017-11-17 ENCOUNTER — Other Ambulatory Visit (HOSPITAL_COMMUNITY): Payer: Self-pay

## 2017-11-17 ENCOUNTER — Telehealth (HOSPITAL_COMMUNITY): Payer: Self-pay

## 2017-11-17 VITALS — BP 92/60 | HR 78 | Wt 229.0 lb

## 2017-11-17 DIAGNOSIS — I428 Other cardiomyopathies: Secondary | ICD-10-CM

## 2017-11-17 DIAGNOSIS — Z8673 Personal history of transient ischemic attack (TIA), and cerebral infarction without residual deficits: Secondary | ICD-10-CM | POA: Diagnosis not present

## 2017-11-17 DIAGNOSIS — R972 Elevated prostate specific antigen [PSA]: Secondary | ICD-10-CM | POA: Insufficient documentation

## 2017-11-17 DIAGNOSIS — Z79899 Other long term (current) drug therapy: Secondary | ICD-10-CM | POA: Insufficient documentation

## 2017-11-17 DIAGNOSIS — Z7901 Long term (current) use of anticoagulants: Secondary | ICD-10-CM | POA: Diagnosis not present

## 2017-11-17 DIAGNOSIS — I5022 Chronic systolic (congestive) heart failure: Secondary | ICD-10-CM | POA: Diagnosis present

## 2017-11-17 DIAGNOSIS — C3490 Malignant neoplasm of unspecified part of unspecified bronchus or lung: Secondary | ICD-10-CM | POA: Diagnosis not present

## 2017-11-17 DIAGNOSIS — C787 Secondary malignant neoplasm of liver and intrahepatic bile duct: Secondary | ICD-10-CM | POA: Insufficient documentation

## 2017-11-17 DIAGNOSIS — R042 Hemoptysis: Secondary | ICD-10-CM | POA: Insufficient documentation

## 2017-11-17 DIAGNOSIS — Z8546 Personal history of malignant neoplasm of prostate: Secondary | ICD-10-CM | POA: Diagnosis not present

## 2017-11-17 DIAGNOSIS — I480 Paroxysmal atrial fibrillation: Secondary | ICD-10-CM | POA: Insufficient documentation

## 2017-11-17 DIAGNOSIS — I110 Hypertensive heart disease with heart failure: Secondary | ICD-10-CM | POA: Diagnosis not present

## 2017-11-17 DIAGNOSIS — R918 Other nonspecific abnormal finding of lung field: Secondary | ICD-10-CM

## 2017-11-17 DIAGNOSIS — N179 Acute kidney failure, unspecified: Secondary | ICD-10-CM | POA: Diagnosis not present

## 2017-11-17 DIAGNOSIS — Z820 Family history of epilepsy and other diseases of the nervous system: Secondary | ICD-10-CM | POA: Diagnosis not present

## 2017-11-17 DIAGNOSIS — G4733 Obstructive sleep apnea (adult) (pediatric): Secondary | ICD-10-CM | POA: Diagnosis not present

## 2017-11-17 DIAGNOSIS — Z87891 Personal history of nicotine dependence: Secondary | ICD-10-CM | POA: Insufficient documentation

## 2017-11-17 DIAGNOSIS — Z8249 Family history of ischemic heart disease and other diseases of the circulatory system: Secondary | ICD-10-CM | POA: Diagnosis not present

## 2017-11-17 LAB — COMPREHENSIVE METABOLIC PANEL
ALT: 29 U/L (ref 0–44)
AST: 27 U/L (ref 15–41)
Albumin: 4.1 g/dL (ref 3.5–5.0)
Alkaline Phosphatase: 71 U/L (ref 38–126)
Anion gap: 11 (ref 5–15)
BUN: 23 mg/dL (ref 8–23)
CHLORIDE: 102 mmol/L (ref 98–111)
CO2: 26 mmol/L (ref 22–32)
CREATININE: 1.49 mg/dL — AB (ref 0.61–1.24)
Calcium: 9.4 mg/dL (ref 8.9–10.3)
GFR calc Af Amer: 54 mL/min — ABNORMAL LOW (ref >60)
GFR, EST NON AFRICAN AMERICAN: 47 mL/min — AB (ref >60)
Glucose, Bld: 110 mg/dL — ABNORMAL HIGH (ref 70–99)
POTASSIUM: 3.4 mmol/L — AB (ref 3.5–5.1)
SODIUM: 139 mmol/L (ref 135–145)
Total Bilirubin: 1 mg/dL (ref 0.3–1.2)
Total Protein: 7.5 g/dL (ref 6.5–8.1)

## 2017-11-17 LAB — T4, FREE: FREE T4: 0.89 ng/dL (ref 0.82–1.77)

## 2017-11-17 LAB — TSH: TSH: 5.745 u[IU]/mL — AB (ref 0.350–4.500)

## 2017-11-17 MED ORDER — POTASSIUM CHLORIDE CRYS ER 20 MEQ PO TBCR: 20.0000 meq | EXTENDED_RELEASE_TABLET | Freq: Every day | ORAL | 3 refills | Status: DC

## 2017-11-17 MED ORDER — DIGOXIN 125 MCG PO TABS: 0.1250 mg | ORAL_TABLET | Freq: Every day | ORAL | 3 refills | Status: DC

## 2017-11-17 MED ORDER — AMIODARONE HCL 200 MG PO TABS: 200.0000 mg | ORAL_TABLET | Freq: Every day | ORAL | 3 refills | Status: DC

## 2017-11-17 MED ORDER — SPIRONOLACTONE 25 MG PO TABS: 25.0000 mg | ORAL_TABLET | Freq: Every day | ORAL | 3 refills | Status: DC

## 2017-11-17 NOTE — Patient Instructions (Signed)
RESTART Digoxin 0.125 mg tablet once daily.  RESTART Spironolactone 12.5 mg (1/2 tablet) once daily.  DECREASE Amiodarone to 200 mg tablet ONCE daily.  Routine lab work today. Will notify you of abnormal results, otherwise no news is good news!  Repeat labs in 1-2 weeks.  Follow up as scheduled.  Take all medication as prescribed the day of your appointment. Bring all medications with you to your appointment.  Do the following things EVERYDAY: 1) Weigh yourself in the morning before breakfast. Write it down and keep it in a log. 2) Take your medicines as prescribed 3) Eat low salt foods-Limit salt (sodium) to 2000 mg per day.  4) Stay as active as you can everyday 5) Limit all fluids for the day to less than 2 liters

## 2017-11-17 NOTE — Telephone Encounter (Signed)
Result Notes for T4, free   Notes recorded by Effie Berkshire, RN on 11/17/2017 at 3:50 PM EDT Done  ------  Notes recorded by Georgiana Shore, NP on 11/17/2017 at 1:48 PM EDT Renal function stable. Potassium slightly low. Have him restart potassium 20 meq daily. He should have BMET scheduled already for 1 week.

## 2017-11-18 LAB — T3, FREE: T3, Free: 2.9 pg/mL (ref 2.0–4.4)

## 2017-11-20 ENCOUNTER — Ambulatory Visit: Payer: 59 | Admitting: Nurse Practitioner

## 2017-11-27 ENCOUNTER — Telehealth: Payer: Self-pay | Admitting: Pharmacist

## 2017-11-27 NOTE — Telephone Encounter (Signed)
Oral Chemotherapy Pharmacist Encounter   Received a call from the patient's wife stating that her husband has developed a black tongue within the last week. Reviewed the patient's medication list and was unable to identify a causative agent. The patient does NOT endorse the use of an antibiotic or pepto-bismol. I did not find and documentation connecting alectinib to black tongue. He did state that he has been vaping and using drops of CBD oil.   He reported that his black tongue is not hairy. Other than visually noticing the discoloration, he has not had any other tongue associacted problems (eg. pain, loss of taste). When he brushes his tongue the blackness goes away. Encouraged him to continue to brush his tongue whenever he brushes his teeth.   Darl Pikes, PharmD, BCPS, [facility] Hematology/Oncology Clinical Pharmacist [facility] [phone]  11/27/2017 11:06 AM

## 2017-12-01 ENCOUNTER — Other Ambulatory Visit (HOSPITAL_COMMUNITY): Payer: Self-pay | Admitting: Cardiology

## 2017-12-02 ENCOUNTER — Other Ambulatory Visit: Payer: Self-pay | Admitting: Nurse Practitioner

## 2017-12-06 NOTE — Progress Notes (Signed)
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  SUMMARY OF ONCOLOGIC HISTORY: Oncology History   Cancer Staging Non-small cell lung cancer (NSCLC) ([place]) Staging form: Lung, AJCC 8th Edition - Clinical stage from 10/02/2017: Stage IV (cT3, cN2, cM1c) - Signed by Truitt Merle, MD on 10/20/2017      Non-small cell lung cancer (NSCLC) ([place])   04/01/2013 Imaging    04/01/2013 CXR IMPRESSION: 1. Cardiomegaly with diffuse increased interstitial markings consistent with pulmonary edema. 2. 5-6 cm rounded density seen in the right lower lobe medially. This could be related to an area of consolidation correlate clinically for pneumonia.. But the rounded and well-defined borders are suspicious for mass. Followup x-ray suggested after  treating for the pulmonary edema/pneumonia to see if this resolves.    04/30/2014 Imaging    04/30/2014 CXR IMPRESSION: Enlarging right lower lobe nodule. Would recommend PET CT to further evaluate.    01/20/2015 Imaging    01/20/2015 CXR IMPRESSION: Significant improvement comparatively, now with smaller pleural effusions and resolving pulmonary edema. Known right lower lobe mass suspicious for bronchogenic carcinoma is redemonstrated.    04/07/2015 PET scan    04/07/2015 PET Scan IMPRESSION:  Hypermetabolic right lower lobe mass Hypermetabolic left adrenal nodule.    08/21/2015 Imaging    08/21/2015 CXR IMPRESSION: Right lower lobe mass.    09/22/2017 Imaging    09/22/2017 CXR IMPRESSION: 1.Moderate right pleural effusion with right lower/middle lobe consolidation. Minimal patchy opacities in left lower lobe. 2.Cardiomegaly.    09/26/2017 Imaging     09/26/2017 CT Chest WO Contrast IMPRESSION: 1. Large RIGHT lower lobe mass with central and peripheral calcifications occupies the near entirety of the RIGHT lower lobe and concerning for neoplasm. 2. Moderate RIGHT effusion. 3. Mild mediastinal and RIGHT hilar adenopathy. 4. Recommend FDG PET scan for biopsy planning/staging    09/26/2017 Tumor Marker     Ref. Range 09/26/2017 21:31  Prostatic Specific Antigen Latest Ref Range: 0.00 - 4.00 ng/mL 84.83 (H)       09/27/2017 Imaging    09/27/2017 CT AP W WO Contrast IMPRESSION: 1. Large complex masses in the right lower lobe of the lung and in the right hepatic lobe. Smaller liver lesions in the left hepatic lobe. Nonspecific lesions in the spleen and kidneys. No significant pathologic adenopathy. This would not be a characteristic metastatic pattern for prostate cancer and is not a specific metastatic and imaging pattern to suggest an individual etiology. Possibilities might include metastatic lung cancer, melanoma, metastatic hepatocellular carcinoma, or a variety of other possibilities. Tissue diagnosis recommended. 2. Moderate prostatomegaly. 3. Scattered mild ascites. 4. Moderate cardiomegaly.  Small inferior pericardial effusion. 5. Trace bilateral pleural effusions.    10/02/2017 Pathology Results    10/02/2017 Surgical Pathology Diagnosis Liver, needle/core biopsy, Left Hepatic Lobe - METASTATIC LUNG ADENOCARCINOMA TO THE LIVER. SEE NOTE    10/02/2017 Cancer Staging    Staging form: Lung, AJCC 8th Edition - Clinical stage from 10/02/2017: Stage IV (cT3, cN2, cM1c) - Signed by Truitt Merle, MD on 10/20/2017    10/04/2017 Initial Diagnosis    Non-small cell lung cancer (NSCLC) ([place])    History of present illness:   09/28/17 Logan [name] 67 y.o. male presented to the  ED for worsening SOB due to CHF. Workup shows multiple masses concerning for cancer. He presents with his wife and 2 family friends.   He had prostate  cancer in 2012 found by biopsy. He was under observation as he declined treatment at the time. He notes his lung mass was found in 2015 which was incidentally discovered while in hospital for CHF and has increased in size along with chest lymph nodes. He has declined biopsies, and scans for these in the past.   He notes other than heart failure he feels fine. His wife notes him coughing up blood for the past 6 months. He notes this occurs when he is on Xarelto as he has Afib. He stopped Xarelto and went back to baby aspirin and blood decreased. Now he sees no blood. Has leg swelling which makes it harder for him to walk and he has abdominal bloating as well with no pain. He notes his appetite has been down for the past few weeks due to bloating. He increased diuretics with overall no weight loss.   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 His [facility]ologist advised him to go to [facility] ED.   He has HTN which is controlled. He had a minor heart attack several years ago which may contribute to his current CHF. He use to smoke cigarettes for 8 years in the 1970s 1ppd. He grew up in the house with a heavy smoker. He now only smokes CBD. He drinks socially.    CURRENT THERAPY:  Alectinib '600mg'$  bid started on 10/21/2017  INTERVAL HISTORY: Logan [name] is a 67 y.o. male who is here for follow up. He saw NP Lacie on 11/08/2017 when he was noted to have good energy and appetite. He also saw Cardiology NP Tamala Julian on 11/17/2017 for HF follow-up, and was noted to be doing well but has occasional dizziness when he doesn't eat.  Today, he is here with his wife. He feels good and states that his appetite and energy are good. He denies LL edema. He will see his Cardiologist soon and states that he is going to the gym. He no longer has hemoptysis. His sleep is improving. His only complaint is constipation. He was advised to chew cloves.   He is back  to work and tolerating well.     REVIEW OF SYSTEMS:  Constitutional: Denies fevers, chills or abnormal weight loss (+) improving appetite, energy Eyes: Denies blurriness of vision Ears, nose, mouth, throat, and face: Denies mucositis or sore throat Respiratory: Denies cough, dyspnea or wheezes (+) OSA, uses nocturnal O2 Cardiovascular: Denies palpitation, chest discomfort or lower extremity swelling Gastrointestinal:  Denies nausea, heartburn (+) constipation  Skin: Denies abnormal skin rashes Lymphatics: Denies new lymphadenopathy or easy bruising Neurological:Denies numbness, tingling or new weaknesses Behavioral/Psych: Mood is stable, no new changes  All other systems were reviewed with the patient and are negative.   MEDICAL HISTORY:  Past Medical History:  Diagnosis Date  . CHF (congestive heart failure) ([place])   . Chronic systolic heart failure ([place]) 04/15/2015    EF 30-35% by echo 2016 , [facility]   . Hypertension   . Lung mass 04/15/2015    Never biopsied. History of PET scan that raised concern.   . Metabolic syndrome 03/16/7515  . OSA on CPAP   . Paroxysmal atrial fibrillation ([place]) 04/15/2015   On apixaban   . Prostate cancer ([place])   .  Thrombocytopenia ([place]) 04/15/2015    SURGICAL HISTORY: Past Surgical History:  Procedure Laterality Date  . CARDIAC CATHETERIZATION N/A 04/27/2015   Procedure: Right/Left Heart Cath and Coronary Angiography;  Surgeon: [name], MD;  Location: [facility];  Service: Cardiovascular;  Laterality: N/A;  . CARDIOVERSION N/A 09/29/2017   Procedure: CARDIOVERSION;  Surgeon: Pixie Casino, MD;  Location: [facility];  Service: Cardiovascular;  Laterality: N/A;  . IR THORACENTESIS ASP PLEURAL SPACE W/IMG GUIDE  09/27/2017  . PROSTATE BIOPSY    . TEE WITHOUT CARDIOVERSION N/A 09/29/2017   Procedure: TRANSESOPHAGEAL ECHOCARDIOGRAM (TEE);  Surgeon: Pixie Casino, MD;  Location: [facility] ENDOSCOPY;  Service: Cardiovascular;  Laterality: N/A;     I have reviewed the social history and family history with the patient and they are unchanged from previous note.  ALLERGIES:  has No Known Allergies.  MEDICATIONS:  Current Outpatient Medications  Medication Sig Dispense Refill  . alectinib (ALECENSA) 150 MG capsule Take 4 capsules (600 mg total) by mouth 2 (two) times daily with a meal. 240 capsule 1  . amiodarone (PACERONE) 200 MG tablet Take 1 tablet (200 mg total) by mouth daily. 90 tablet 3  . apixaban (ELIQUIS) 5 MG TABS tablet Take 1 tablet (5 mg total) by mouth 2 (two) times daily. 60 tablet 6  . Cholecalciferol ([employer]S VIT D 5000 HIGH-POTENCY) 5000 units capsule Take 5,000 Units by mouth daily.    . digoxin (LANOXIN) 0.125 MG tablet Take 1 tablet (0.125 mg total) by mouth daily. 90 tablet 3  . furosemide (LASIX) 80 MG tablet Take 80 mg by mouth daily. Take 40 mg in pm as needed for weight gain/swelling    . losartan (COZAAR) 25 MG tablet Take 1 tablet (25 mg total) by mouth every evening. 90 tablet 2  . Multiple Vitamin (MULTIVITAMIN) tablet Take 1 tablet by mouth daily.    . potassium chloride SA (KLOR-CON M20) 20 MEQ tablet Take 1 tablet (20 mEq total) by mouth daily. 90 tablet 3  . spironolactone (ALDACTONE) 25 MG tablet Take 1 tablet (25 mg total) by mouth daily. 90 tablet 3  . traZODone (DESYREL) 50 MG tablet Take 1 tablet (50 mg total) by mouth at bedtime as needed for sleep. 20 tablet 0   No current facility-administered medications for this visit.     PHYSICAL EXAMINATION: ECOG PERFORMANCE STATUS: 1  Vitals:   12/07/17 1526  BP: (!) 98/59  Pulse: 78  Resp: 18  Temp: 97.6 F (36.4 C)  SpO2: 97%   Filed Weights   12/07/17 1526  Weight: 236 lb 9.6 oz (107.3 kg)    GENERAL:alert, no distress and comfortable  SKIN: skin color, texture, turgor are normal, no rashes or significant lesions EYES: normal, Conjunctiva are pink and non-injected, sclera clear OROPHARYNX:no exudate, no erythema and lips, buccal  mucosa, and tongue normal  NECK: supple, thyroid normal size, non-tender, without nodularity LYMPH:  no palpable lymphadenopathy in the cervical, axillary or inguinal LUNGS: clear to auscultation and percussion with normal breathing effort  HEART: regular rate & rhythm and no murmurs and no lower extremity edema ABDOMEN:abdomen soft, non-tender and normal bowel sounds Musculoskeletal:no cyanosis of digits and no clubbing  NEURO: alert & oriented x 3 with fluent speech, no focal motor/sensory deficits  LABORATORY DATA:  I have reviewed the data as listed CBC Latest Ref Rng & Units 12/07/2017 11/08/2017 10/23/2017  WBC 4.0 - 10.3 K/uL 5.7 6.4 6.2  Hemoglobin 13.0 - 17.1 g/dL 10.1(L) 11.0(L)  12.3(L)  Hematocrit 38.4 - 49.9 % 29.9(L) 32.9(L) 37.0(L)  Platelets 140 - 400 K/uL 141 160 282     CMP Latest Ref Rng & Units 12/07/2017 11/17/2017 11/08/2017  Glucose 70 - 99 mg/dL 137(H) 110(H) 149(H)  BUN 8 - 23 mg/dL 25(H) 23 23  Creatinine 0.61 - 1.24 mg/dL 2.44(H) 1.49(H) 1.62(H)  Sodium 135 - 145 mmol/L 140 139 137  Potassium 3.5 - 5.1 mmol/L 4.0 3.4(L) 3.7  Chloride 98 - 111 mmol/L 103 102 98  CO2 22 - 32 mmol/L '28 26 29  '$ Calcium 8.9 - 10.3 mg/dL 9.2 9.4 9.3  Total Protein 6.5 - 8.1 g/dL 7.9 7.5 8.1  Total Bilirubin 0.3 - 1.2 mg/dL 0.4 1.0 0.4  Alkaline Phos 38 - 126 U/L 78 71 93  AST 15 - 41 U/L '27 27 26  '$ ALT 0 - 44 U/L '26 29 29   '$ Results for Logan [name], Logan [name] (MRN [id number]) as of 10/19/2017 17:28  Ref. Range 09/26/2017 21:31  Prostatic Specific Antigen Latest Ref Range: 0.00 - 4.00 ng/mL 84.83 (H)     PATHOLOGY  10/02/2017 Molecular Pathology    10/02/2017 Surgical Pathology Diagnosis Liver, needle/core biopsy, Left Hepatic Lobe - METASTATIC LUNG ADENOCARCINOMA TO THE LIVER. SEE NOTE Diagnosis Note Immunohistochemical stains show that the tumor cells are positive for CK7, TTF-1 and Napsin A; while they are negative for CK20 and Hepar, consistent with the above  diagnosis Specimen(s) Obtained: Liver, needle/core biopsy, Left Hepatic Lobe Specimen Clinical Information Right lung lesion with liver lesions, concern for metastatic disease (nt) Gross Received in formalin are four cores of pale tan-pink soft tissue ranging from 0.9 to 1.1 cm, submitted entirely in two cassettes. (AK:ah 10/02/17) Stain(s) used in Diagnosis: The following stain(s) were used in diagnosing the case: Thyroid Transcription Factor -1, Napsin-A, HEPAR-1, CK-7, CK 20. The control(s) stained appropriately.   RADIOGRAPHIC STUDIES: I have personally reviewed the radiological images as listed and agreed with the findings in the report.   10/10/2017 MRI Brain WO Contrast IMPRESSION: 1. Single subcentimeter focus of diffusion restriction in the left cerebellum is favored to be an acute ischemic infarct, given the lack of surrounding edema. A small metastasis could also have this appearance, but perilesional edema is typically much greater. If there are no contraindications, contrast administration might be helpful. 2. Otherwise normal brain for age.  09/27/2017 CT AP W WO Contrast IMPRESSION: 1. Large complex masses in the right lower lobe of the lung and in the right hepatic lobe. Smaller liver lesions in the left hepatic lobe. Nonspecific lesions in the spleen and kidneys. No significant pathologic adenopathy. This would not be a characteristic metastatic pattern for prostate cancer and is not a specific metastatic and imaging pattern to suggest an individual etiology. Possibilities might include metastatic lung cancer, melanoma, metastatic hepatocellular carcinoma, or a variety of other possibilities. Tissue diagnosis recommended. 2. Moderate prostatomegaly. 3. Scattered mild ascites. 4. Moderate cardiomegaly.  Small inferior pericardial effusion. 5. Trace bilateral pleural effusions.  09/26/2017 CT Chest WO Contrast IMPRESSION: 1. Large RIGHT lower lobe mass with  central and peripheral calcifications occupies the near entirety of the RIGHT lower lobe and concerning for neoplasm. 2. Moderate RIGHT effusion. 3. Mild mediastinal and RIGHT hilar adenopathy. 4. Recommend FDG PET scan for biopsy planning/staging  09/22/2017 CXR IMPRESSION: 1.Moderate right pleural effusion with right lower/middle lobe consolidation. Minimal patchy opacities in left lower lobe. 2.Cardiomegaly.  08/21/2015 CXR IMPRESSION: Right lower lobe mass.  04/07/2015 PET Scan IMPRESSION:  Hypermetabolic right  lower lobe mass Hypermetabolic left adrenal nodule.  01/20/2015 CXR IMPRESSION: Significant improvement comparatively, now with smaller pleural effusions and resolving pulmonary edema. Known right lower lobe mass suspicious for bronchogenic carcinoma is redemonstrated.  04/30/2014 CXR IMPRESSION: Enlarging right lower lobe nodule. Would recommend PET CT to further evaluate.  04/01/2013 CXR IMPRESSION: 1. Cardiomegaly with diffuse increased interstitial markings consistent with pulmonary edema. 2. 5-6 cm rounded density seen in the right lower lobe medially. This could be related to an area of consolidation correlate clinically for pneumonia.. But the rounded and well-defined borders are suspicious for mass. Followup x-ray suggested after  treating for the pulmonary edema/pneumonia to see if this resolves.   ASSESSMENT & PLAN:  Logan [name] is a 67 y.o. male who presented to the ED on 09/26/17 for worsening SOB and leg swelling. He has a h/o a lung mass since 2015 that has not been evaluated and H/o prostate cancer on observation.   1. 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 -We discussed that his cancer is not curable at this stage, and the goal of therapy is palliative, to prolong his life and preserve his quality of life. -Due to his advanced heart failure, he is a good candidate for intensive chemotherapy. -tumor NGS showed ALK translocation (+) --he started Alectinib on  10/21/2017, tolerating very well  -He is clinically doing very well, energy much improved, is back to work.  Leg swelling resolved, cough and dyspnea on exertion much improved.  Has not had recurrent hemoptysis -Labs reviewed. CBC showed Hg 10.1 Hct 29.9 WBC 5.7 and platelets 141K. CMP showed BUN 25 Cr 2.44 GFR 30. -will continue Alectinib. I recommend him to follow-up with Dr. Algernon Huxley closely, to see if his diuretics needs to be adjusted, due to his worsening renal function, which may impact his dosing of Alectinib -f/u in 5 weeks  -PET scan in 5 weeks before visit.  Due to his renal failure, he cannot receive CT contrast.   2.  History of untreated prostate cancer, with rising PSA -will f/u PSA   3.  Bilateral pleural effusion,R>L, likely related to CHF -s/p thoracentesis 7/17 with cytology showing reactive mesothelial cells - stable on f/u CXR. -he is on lasix   4. 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 Aundra Dubin  -Currently on digoxin, Lasix, spironolactone and Cozaar   5. Atrial Fibrillation -Was on Xarelto but off due to hemoptysis -Still has hemoptysis during admission -Currently on aspirin 81, no recurrent hemoptysis, OK to restart anticoagulant if needed for her heart disease  -Continue aspirin 81 and follow-up with cardiology to decide on antiplatelet versus anticoagulation -On amiodarone and digoxin -Rate controlled  6.Incidental stroke:  left cerebellar  small infarct -Resultant no neurological focal deficit -MRI punctate to small left cerebellar infarct -MRA unremarkable -Carotid Doppler unremarkable -Xarelto (rivaroxaban) daily prior to admission (off due to hemoptysis), now on aspirin 81 mg daily.  Ideally, patient should be on anticoagulation however due to severe hemoptysis, hematuria and anemia, patient not anticoagulated candidate at this time.  Agree with  cardiology plan to continue aspirin 81 and then follow-up as outpatient. -Patient counseled to be compliant with his antithrombotic medications -Ongoing aggressive stroke risk factor management  7. Goal of care discussion  -We again discussed the incurable nature of his cancer, and the overall poor prognosis, especially if he does not have good response to treatment or progressed on treatment  -The patient understands the goal of care is palliative. -He is full code now   8. Renal insufficiency, worsening  -CMP showed BUN 25 Cr 2.44 GFR 30 on 12/07/2017, worse than before  -I advised him to see Dr. Aundra Dubin back to adjust his diuretics  -I will monitor. I might change his medications if his kidney function doesn't improve  Plan: -f/u in 5 weeks with lab and PET scan few days before.  -will send a message to Dr. Aundra Dubin and his PCP regarding his worsening renal function     All questions were answered. The patient knows to call the clinic with any problems, questions or concerns. No barriers to learning was detected. I spent 20 minutes counseling the patient face to face. The total time spent in the appointment was 25 minutes and more than 50% was on counseling and review of test results  I, Noor Dweik am acting as scribe for Dr. Truitt Merle.  I have reviewed the above documentation for accuracy and completeness, and I agree with the above.     Truitt Merle, MD 12/07/2017

## 2017-12-07 ENCOUNTER — Encounter: Payer: Self-pay | Admitting: Hematology

## 2017-12-07 ENCOUNTER — Inpatient Hospital Stay (HOSPITAL_BASED_OUTPATIENT_CLINIC_OR_DEPARTMENT_OTHER): Payer: 59 | Admitting: Hematology

## 2017-12-07 ENCOUNTER — Other Ambulatory Visit: Payer: Self-pay | Admitting: Hematology

## 2017-12-07 ENCOUNTER — Telehealth: Payer: Self-pay | Admitting: Hematology

## 2017-12-07 ENCOUNTER — Inpatient Hospital Stay: Payer: 59 | Attending: Hematology

## 2017-12-07 VITALS — BP 98/59 | HR 78 | Temp 97.6°F | Resp 18 | Ht 71.0 in | Wt 236.6 lb

## 2017-12-07 DIAGNOSIS — C787 Secondary malignant neoplasm of liver and intrahepatic bile duct: Secondary | ICD-10-CM | POA: Diagnosis present

## 2017-12-07 DIAGNOSIS — C3431 Malignant neoplasm of lower lobe, right bronchus or lung: Secondary | ICD-10-CM | POA: Insufficient documentation

## 2017-12-07 DIAGNOSIS — I110 Hypertensive heart disease with heart failure: Secondary | ICD-10-CM | POA: Diagnosis not present

## 2017-12-07 DIAGNOSIS — C61 Malignant neoplasm of prostate: Secondary | ICD-10-CM

## 2017-12-07 DIAGNOSIS — I4891 Unspecified atrial fibrillation: Secondary | ICD-10-CM | POA: Insufficient documentation

## 2017-12-07 DIAGNOSIS — Z8546 Personal history of malignant neoplasm of prostate: Secondary | ICD-10-CM | POA: Insufficient documentation

## 2017-12-07 DIAGNOSIS — C3491 Malignant neoplasm of unspecified part of right bronchus or lung: Secondary | ICD-10-CM

## 2017-12-07 DIAGNOSIS — I480 Paroxysmal atrial fibrillation: Secondary | ICD-10-CM

## 2017-12-07 DIAGNOSIS — K5900 Constipation, unspecified: Secondary | ICD-10-CM

## 2017-12-07 DIAGNOSIS — Z7982 Long term (current) use of aspirin: Secondary | ICD-10-CM | POA: Diagnosis not present

## 2017-12-07 DIAGNOSIS — I428 Other cardiomyopathies: Secondary | ICD-10-CM

## 2017-12-07 DIAGNOSIS — J90 Pleural effusion, not elsewhere classified: Secondary | ICD-10-CM | POA: Diagnosis not present

## 2017-12-07 LAB — CMP (CANCER CENTER ONLY)
ALBUMIN: 4.2 g/dL (ref 3.5–5.0)
ALK PHOS: 78 U/L (ref 38–126)
ALT: 26 U/L (ref 0–44)
ANION GAP: 9 (ref 5–15)
AST: 27 U/L (ref 15–41)
BUN: 25 mg/dL — ABNORMAL HIGH (ref 8–23)
CHLORIDE: 103 mmol/L (ref 98–111)
CO2: 28 mmol/L (ref 22–32)
Calcium: 9.2 mg/dL (ref 8.9–10.3)
Creatinine: 2.44 mg/dL — ABNORMAL HIGH (ref 0.61–1.24)
GFR, Est AFR Am: 30 mL/min — ABNORMAL LOW (ref >60)
GFR, Estimated: 26 mL/min — ABNORMAL LOW (ref >60)
GLUCOSE: 137 mg/dL — AB (ref 70–99)
POTASSIUM: 4 mmol/L (ref 3.5–5.1)
SODIUM: 140 mmol/L (ref 135–145)
Total Bilirubin: 0.4 mg/dL (ref 0.3–1.2)
Total Protein: 7.9 g/dL (ref 6.5–8.1)

## 2017-12-07 LAB — CBC WITH DIFFERENTIAL (CANCER CENTER ONLY)
BASOS PCT: 1 %
Basophils Absolute: 0.1 10*3/uL (ref 0.0–0.1)
EOS ABS: 0 10*3/uL (ref 0.0–0.5)
EOS PCT: 1 %
HCT: 29.9 % — ABNORMAL LOW (ref 38.4–49.9)
HEMOGLOBIN: 10.1 g/dL — AB (ref 13.0–17.1)
Lymphocytes Relative: 37 %
Lymphs Abs: 2.1 10*3/uL (ref 0.9–3.3)
MCH: 30.8 pg (ref 27.2–33.4)
MCHC: 34 g/dL (ref 32.0–36.0)
MCV: 90.8 fL (ref 79.3–98.0)
Monocytes Absolute: 0.4 10*3/uL (ref 0.1–0.9)
Monocytes Relative: 7 %
NEUTROS PCT: 54 %
Neutro Abs: 3.1 10*3/uL (ref 1.5–6.5)
PLATELETS: 141 10*3/uL (ref 140–400)
RBC: 3.29 MIL/uL — ABNORMAL LOW (ref 4.20–5.82)
RDW: 16.6 % — AB (ref 11.0–14.6)
WBC Count: 5.7 10*3/uL (ref 4.0–10.3)

## 2017-12-07 LAB — CK: Total CK: 335 U/L (ref 49–397)

## 2017-12-07 NOTE — Telephone Encounter (Signed)
Scheduled appt per 9/26 los - gave patient AVS and calender per los. [facility] to contact patient with PET scan .

## 2017-12-08 ENCOUNTER — Encounter: Payer: Self-pay | Admitting: Hematology

## 2017-12-11 ENCOUNTER — Telehealth (HOSPITAL_COMMUNITY): Payer: Self-pay | Admitting: *Deleted

## 2017-12-11 NOTE — Telephone Encounter (Signed)
-----   Message from [name], MD sent at 12/09/2017  9:04 PM EDT ----- Yes, we will adjust his meds.   Heather:  Please have this patient stop losartan for now. He will also need to cut Lasix back to 40 mg daily for right now.  Needs BMET in 1 week and followup in our office.   Thanks.  [place] ----- Message ----- From: Truitt Merle, MD Sent: 12/08/2017   6:39 PM EDT To: [name], MD  Dr. Aundra Dubin,  He is doing very well clinically, I think he is responding well to his lung cancer treatment.  I noted his lab from yesterday that Cr has increased from 1.5 to 2.4, he is on several medications which may need to be adjusted if renal function does not improve, could you review his meds, or see him sooner to see if he needs to back off his diuretics?  He was previously on Xarelto for his heart condition, stopped after an episode of hemoptysis in the hospital.  I plan to repeat scan next month, if his cancer regressl, you may restart anticoagulation for his heart condition if needed. But his CKD may make anticoagulation choice limited.   Thanks much,  yan

## 2017-12-11 NOTE — Telephone Encounter (Signed)
Pt aware of medication changes and appt already scheduled.

## 2017-12-18 ENCOUNTER — Encounter (HOSPITAL_COMMUNITY): Payer: Self-pay

## 2017-12-19 ENCOUNTER — Encounter (HOSPITAL_COMMUNITY): Payer: 59 | Admitting: Cardiology

## 2017-12-20 ENCOUNTER — Other Ambulatory Visit (HOSPITAL_COMMUNITY): Payer: Self-pay | Admitting: *Deleted

## 2017-12-20 MED ORDER — FUROSEMIDE 80 MG PO TABS: 40.0000 mg | ORAL_TABLET | Freq: Every day | ORAL | 3 refills | Status: DC

## 2017-12-20 MED ORDER — POTASSIUM CHLORIDE CRYS ER 20 MEQ PO TBCR: 20.0000 meq | EXTENDED_RELEASE_TABLET | Freq: Every day | ORAL | 3 refills | Status: DC

## 2017-12-30 ENCOUNTER — Other Ambulatory Visit: Payer: Self-pay | Admitting: Hematology

## 2017-12-30 DIAGNOSIS — C3491 Malignant neoplasm of unspecified part of right bronchus or lung: Secondary | ICD-10-CM

## 2018-01-01 ENCOUNTER — Telehealth: Payer: Self-pay | Admitting: Hematology

## 2018-01-01 NOTE — Telephone Encounter (Signed)
Faxed medical records to [facility] [phone], Release ID: [id number]

## 2018-01-02 ENCOUNTER — Other Ambulatory Visit: Payer: Self-pay | Admitting: Interventional Cardiology

## 2018-01-03 ENCOUNTER — Telehealth: Payer: Self-pay | Admitting: Interventional Cardiology

## 2018-01-03 NOTE — Telephone Encounter (Signed)
Returned call to wife [name] (on [place]) who is asking to speak with Dr Tamala Julian directly about the patient's care and how to keep him out of the hospital and out of CHF.  She has multiple questions regarding pacemakers and defibrillators and only wants to speak with Dr Tamala Julian.  Advised wife since she has so many questions Dr Tamala Julian may need them to schedule an appointment so that everything can be discussed.  Informed her I will need to ask Dr Tamala Julian which he prefers.  Wife then asked how soon she will receive a call back.  Advised I am unsure how soon he will review and respond however Anderson Malta or I will call back with further information as soon as he reviews and lets [place] know.  Wife continued to press for an amount of time before she hears back as she is traveling and wants to make sure she has her phone with her.  I again advised I can not give her a time frame but Dr Tamala Julian does generally review and respond quickly however it does depend on how busy the clinic or hospital is.  She stated she felt I was "being rude and facetious."  I apologized that she felt that way, that I was trying to help her.  She again stated I was being rude and she would like to be able to tell someone, everyone about it.  I gave her my full name, Janan Halter, RN, Georgana Curio, RN and the phone number to the office of Patient Experience.

## 2018-01-03 NOTE — Telephone Encounter (Signed)
New message   Per patient wife states that she has questions on how to keep him out of congestive failure clinic. Please call to discuss.

## 2018-01-04 ENCOUNTER — Telehealth: Payer: Self-pay

## 2018-01-04 NOTE — Telephone Encounter (Signed)
Patient's wife calls just to let Dr. Burr [name] know he has been in [facility] since 10/21 with fluid in his lungs. Has been in ICU but today being moved to a stepdown unit.  He has an appointment with Dr. Burr [name] 11/7.

## 2018-01-04 NOTE — Telephone Encounter (Signed)
Dr. Tamala Julian called and left message for wife to call back

## 2018-01-05 NOTE — Telephone Encounter (Signed)
Late entry: Left message 01/04/18 to call back to discuss concerns.

## 2018-01-09 NOTE — Telephone Encounter (Signed)
Appointment with Dr. Aundra Dubin would be most productive. I am happy to see if long wait for [facility].

## 2018-01-10 ENCOUNTER — Ambulatory Visit (HOSPITAL_COMMUNITY)
Admission: RE | Admit: 2018-01-10 | Discharge: 2018-01-10 | Disposition: A | Discharge status: Home / Self Care | Payer: 59 | Source: Ambulatory Visit | Attending: Hematology | Admitting: Hematology

## 2018-01-10 DIAGNOSIS — C3491 Malignant neoplasm of unspecified part of right bronchus or lung: Secondary | ICD-10-CM | POA: Insufficient documentation

## 2018-01-10 LAB — GLUCOSE, CAPILLARY: GLUCOSE-CAPILLARY: 107 mg/dL — AB (ref 70–99)

## 2018-01-10 MED ORDER — FLUDEOXYGLUCOSE F - 18 (FDG) INJECTION
11.1800 | Freq: Once | INTRAVENOUS | Status: AC | PRN
  Administered 2018-01-10: 11.18 via INTRAVENOUS

## 2018-01-15 ENCOUNTER — Other Ambulatory Visit: Payer: Self-pay | Admitting: Hematology

## 2018-01-15 ENCOUNTER — Inpatient Hospital Stay: Payer: 59 | Attending: Hematology

## 2018-01-15 DIAGNOSIS — I130 Hypertensive heart and chronic kidney disease with heart failure and stage 1 through stage 4 chronic kidney disease, or unspecified chronic kidney disease: Secondary | ICD-10-CM | POA: Diagnosis not present

## 2018-01-15 DIAGNOSIS — I5022 Chronic systolic (congestive) heart failure: Secondary | ICD-10-CM | POA: Insufficient documentation

## 2018-01-15 DIAGNOSIS — J90 Pleural effusion, not elsewhere classified: Secondary | ICD-10-CM | POA: Diagnosis not present

## 2018-01-15 DIAGNOSIS — N183 Chronic kidney disease, stage 3 (moderate): Secondary | ICD-10-CM | POA: Diagnosis not present

## 2018-01-15 DIAGNOSIS — I480 Paroxysmal atrial fibrillation: Secondary | ICD-10-CM | POA: Insufficient documentation

## 2018-01-15 DIAGNOSIS — C787 Secondary malignant neoplasm of liver and intrahepatic bile duct: Secondary | ICD-10-CM | POA: Diagnosis present

## 2018-01-15 DIAGNOSIS — C3431 Malignant neoplasm of lower lobe, right bronchus or lung: Secondary | ICD-10-CM | POA: Diagnosis present

## 2018-01-15 DIAGNOSIS — Z7982 Long term (current) use of aspirin: Secondary | ICD-10-CM | POA: Insufficient documentation

## 2018-01-15 DIAGNOSIS — C3491 Malignant neoplasm of unspecified part of right bronchus or lung: Secondary | ICD-10-CM

## 2018-01-15 LAB — CMP (CANCER CENTER ONLY)
ALBUMIN: 4 g/dL (ref 3.5–5.0)
ALK PHOS: 84 U/L (ref 38–126)
ALT: 17 U/L (ref 0–44)
ANION GAP: 10 (ref 5–15)
AST: 20 U/L (ref 15–41)
BUN: 19 mg/dL (ref 8–23)
CALCIUM: 9.6 mg/dL (ref 8.9–10.3)
CHLORIDE: 100 mmol/L (ref 98–111)
CO2: 30 mmol/L (ref 22–32)
CREATININE: 1.58 mg/dL — AB (ref 0.61–1.24)
GFR, Est AFR Am: 51 mL/min — ABNORMAL LOW (ref >60)
GFR, Estimated: 44 mL/min — ABNORMAL LOW (ref >60)
GLUCOSE: 98 mg/dL (ref 70–99)
Potassium: 4 mmol/L (ref 3.5–5.1)
SODIUM: 140 mmol/L (ref 135–145)
Total Bilirubin: 0.3 mg/dL (ref 0.3–1.2)
Total Protein: 7.7 g/dL (ref 6.5–8.1)

## 2018-01-15 LAB — CBC WITH DIFFERENTIAL (CANCER CENTER ONLY)
Abs Immature Granulocytes: 0.06 10*3/uL (ref 0.00–0.07)
BASOS ABS: 0 10*3/uL (ref 0.0–0.1)
Basophils Relative: 1 %
EOS ABS: 0 10*3/uL (ref 0.0–0.5)
Eosinophils Relative: 0 %
HCT: 31.1 % — ABNORMAL LOW (ref 39.0–52.0)
HEMOGLOBIN: 10.2 g/dL — AB (ref 13.0–17.0)
IMMATURE GRANULOCYTES: 1 %
LYMPHS PCT: 37 %
Lymphs Abs: 2.3 10*3/uL (ref 0.7–4.0)
MCH: 29.7 pg (ref 26.0–34.0)
MCHC: 32.8 g/dL (ref 30.0–36.0)
MCV: 90.7 fL (ref 80.0–100.0)
MONOS PCT: 7 %
Monocytes Absolute: 0.5 10*3/uL (ref 0.1–1.0)
NEUTROS PCT: 54 %
Neutro Abs: 3.4 10*3/uL (ref 1.7–7.7)
PLATELETS: 315 10*3/uL (ref 150–400)
RBC: 3.43 MIL/uL — ABNORMAL LOW (ref 4.22–5.81)
RDW: 15 % (ref 11.5–15.5)
WBC Count: 6.3 10*3/uL (ref 4.0–10.5)
nRBC: 0 % (ref 0.0–0.2)

## 2018-01-15 LAB — CK: Total CK: 140 U/L (ref 49–397)

## 2018-01-17 NOTE — Progress Notes (Signed)
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  SUMMARY OF ONCOLOGIC HISTORY: Oncology History   Cancer Staging Non-small cell lung cancer (NSCLC) ([place]) Staging form: Lung, AJCC 8th Edition - Clinical stage from 10/02/2017: Stage IV (cT3, cN2, cM1c) - Signed by Truitt Merle, MD on 10/20/2017      Non-small cell lung cancer (NSCLC) ([place])   04/01/2013 Imaging    04/01/2013 CXR IMPRESSION: 1. Cardiomegaly with diffuse increased interstitial markings consistent with pulmonary edema. 2. 5-6 cm rounded density seen in the right lower lobe medially. This could be related to an area of consolidation correlate clinically for pneumonia.. But the rounded and well-defined borders are suspicious for mass. Followup x-ray suggested after  treating for the pulmonary edema/pneumonia to see if this resolves.    04/30/2014 Imaging    04/30/2014 CXR IMPRESSION: Enlarging right lower lobe nodule. Would recommend PET CT to further evaluate.    01/20/2015 Imaging    01/20/2015 CXR IMPRESSION: Significant improvement comparatively, now with smaller pleural effusions and resolving pulmonary edema. Known right lower lobe mass suspicious for bronchogenic carcinoma is redemonstrated.    04/07/2015 PET scan    04/07/2015 PET Scan IMPRESSION:  Hypermetabolic right lower lobe mass Hypermetabolic left adrenal nodule.    08/21/2015 Imaging    08/21/2015 CXR IMPRESSION: Right lower lobe mass.    09/22/2017 Imaging    09/22/2017 CXR IMPRESSION: 1.Moderate right pleural effusion with right lower/middle lobe consolidation. Minimal patchy opacities in left lower lobe. 2.Cardiomegaly.    09/26/2017 Imaging     09/26/2017 CT Chest WO Contrast IMPRESSION: 1. Large RIGHT lower lobe mass with central and peripheral calcifications occupies the near entirety of the RIGHT lower lobe and concerning for neoplasm. 2. Moderate RIGHT effusion. 3. Mild mediastinal and RIGHT hilar adenopathy. 4. Recommend FDG PET scan for biopsy planning/staging    09/26/2017 Tumor Marker     Ref. Range 09/26/2017 21:31  Prostatic Specific Antigen Latest Ref Range: 0.00 - 4.00 ng/mL 84.83 (H)       09/27/2017 Imaging    09/27/2017 CT AP W WO Contrast IMPRESSION: 1. Large complex masses in the right lower lobe of the lung and in the right hepatic lobe. Smaller liver lesions in the left hepatic lobe. Nonspecific lesions in the spleen and kidneys. No significant pathologic adenopathy. This would not be a characteristic metastatic pattern for prostate cancer and is not a specific metastatic and imaging pattern to suggest an individual etiology. Possibilities might include metastatic lung cancer, melanoma, metastatic hepatocellular carcinoma, or a variety of other possibilities. Tissue diagnosis recommended. 2. Moderate prostatomegaly. 3. Scattered mild ascites. 4. Moderate cardiomegaly.  Small inferior pericardial effusion. 5. Trace bilateral pleural effusions.    10/02/2017 Pathology Results    10/02/2017 Surgical Pathology Diagnosis Liver, needle/core biopsy, Left Hepatic Lobe - METASTATIC LUNG ADENOCARCINOMA TO THE LIVER. SEE NOTE    10/02/2017 Cancer Staging    Staging form: Lung, AJCC 8th Edition - Clinical stage from 10/02/2017: Stage IV (cT3, cN2, cM1c) - Signed by Truitt Merle, MD on 10/20/2017    10/04/2017 Initial Diagnosis    Non-small cell lung cancer (NSCLC) ([place])    01/10/2018 PET scan    01/10/2018 PET Scan IMPRESSION: 1. Marked reduction in size of  the right lower lobe mass with fairly low metabolic activity in the residual mass, maximum SUV 5.3. Notably reduced thoracic adenopathy which is not  currently hypermetabolic. 2. Marked reduction in size and conspicuity of the hepatic tumors, both of which have metabolic activity similar to that of the background liver parenchyma. 3. Resolution of the splenic mass. No additional or new lesions are identified. 4. Accentuated activity anteriorly in the left seventh rib, low-grade with maximum SUV 3.5, without appreciable CT abnormality on the CT data, significance uncertain. 5. Accentuated activity at the right sternoclavicular joint, SUV 7.2, probably from arthropathy. 6. Accentuated activity in the posterior and apical portions of the prostate gland, significance uncertain. 7. Other imaging findings of potential clinical significance: Chronic left maxillary sinusitis.    History of present illness:   09/28/17 Logan [name] 67 y.o. male presented to the ED for worsening SOB due to CHF. Workup shows multiple masses concerning for cancer. He presents with his wife and 2 family friends.   He had prostate cancer in 2012 found by biopsy. He was under observation as he declined treatment at the time. He notes his lung mass was found in 2015 which was incidentally discovered while in hospital for CHF and has increased in size along with chest lymph nodes. He has declined biopsies, and scans for these in the past.   He notes other than heart failure he feels fine. His wife notes him coughing up blood for the past 6 months. He notes this occurs when he is on Xarelto as he has Afib. He stopped Xarelto and went back to baby aspirin and blood decreased. Now he sees no blood. Has leg swelling which makes it harder for him to walk and he has abdominal bloating as well with no pain. He notes his appetite has been down for the past few weeks due to bloating. He increased diuretics with overall no weight loss.   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 His [facility]ologist advised him  to go to [facility] ED.   He has HTN which is controlled. He had a minor heart attack several years ago which may contribute to his current CHF. He use to smoke cigarettes for 8 years in the 1970s 1ppd. He grew up in the house with a heavy smoker. He now only smokes CBD. He drinks socially.    CURRENT THERAPY:  Alectinib '600mg'$  bid started on 10/21/2017  INTERVAL HISTORY: Logan [name] is a 67 y.o. male who is here for follow up. Since out last visit, he was hospitalized for cardiac arrest and respiratory failure. Today, he is here with his wife. He recovered well from his cardiac arrest. He initially noticed weight gain and started feeling unwell before his recent unfortunate hospitalization. He says that he ate a lot and drank lemon water before feeling shortness of breath that led to the ER visit. He says that his abdomen usually swells when his CHF worsens. He reports having panic attacks and states that he likes to meditate.   He states that he's been having tailbone pain after sitting for a long time on the hospital bed.  He will follow up with cardiology for his cardiac problems.    REVIEW OF SYSTEMS:  Constitutional: Denies fevers, chills or abnormal weight loss  Eyes: Denies blurriness of vision Ears, nose, mouth, throat, and face: Denies mucositis or sore throat Respiratory: Denies cough, dyspnea or wheezes  Cardiovascular: Denies palpitation, chest discomfort or lower extremity swelling Gastrointestinal:  Denies nausea, heartburn, or changes in BMs Skin: Denies abnormal skin rashes Lymphatics: Denies new lymphadenopathy or easy bruising Neurological:Denies numbness, tingling or new weaknesses Behavioral/Psych: Mood is stable, no new changes  All other systems were reviewed with the patient and are negative.   MEDICAL HISTORY:  Past Medical History:  Diagnosis Date  . CHF (congestive heart failure) ([place])   . Chronic systolic heart failure ([place]) 04/15/2015    EF 30-35% by  echo 2016 , [facility]   . Hypertension   . Lung mass 04/15/2015    Never biopsied. History of PET scan that raised concern.   . Metabolic syndrome 04/14/3084  . OSA on CPAP   . Paroxysmal atrial fibrillation ([place]) 04/15/2015   On apixaban   . Prostate cancer ([place])   . Thrombocytopenia ([place]) 04/15/2015    SURGICAL HISTORY: Past Surgical History:  Procedure Laterality Date  . CARDIAC CATHETERIZATION N/A 04/27/2015   Procedure: Right/Left Heart Cath and Coronary Angiography;  Surgeon: [name], MD;  Location: [facility];  Service: Cardiovascular;  Laterality: N/A;  . CARDIOVERSION N/A 09/29/2017   Procedure: CARDIOVERSION;  Surgeon: Pixie Casino, MD;  Location: [facility];  Service: Cardiovascular;  Laterality: N/A;  . IR THORACENTESIS ASP PLEURAL SPACE W/IMG GUIDE  09/27/2017  . PROSTATE BIOPSY    . TEE WITHOUT CARDIOVERSION N/A 09/29/2017   Procedure: TRANSESOPHAGEAL ECHOCARDIOGRAM (TEE);  Surgeon: Pixie Casino, MD;  Location: [facility] ENDOSCOPY;  Service: Cardiovascular;  Laterality: N/A;    I have reviewed the social history and family history with the patient and they are unchanged from previous note.  ALLERGIES:  has No Known Allergies.  MEDICATIONS:  Current Outpatient Medications  Medication Sig Dispense Refill  . ALECENSA 150 MG capsule TAKE 4 CAPSULES (600 MG TOTAL) BY MOUTH 2 TIMES DAILY WITH A MEAL 240 capsule 0  . amiodarone (PACERONE) 200 MG tablet Take 1 tablet (200 mg total) by mouth daily. 90 tablet 3  . apixaban (ELIQUIS) 5 MG TABS tablet Take 1 tablet (5 mg total) by mouth 2 (two) times daily. 60 tablet 6  . Cholecalciferol ([employer] VIT D 5000 HIGH-POTENCY) 5000 units capsule Take 5,000 Units by mouth daily.    . digoxin (LANOXIN) 0.125 MG tablet Take 1 tablet (0.125 mg total) by mouth daily. 90 tablet 3  . furosemide (LASIX) 80 MG tablet Take 0.5 tablets (40 mg total) by mouth daily. 45 tablet 3  . lisinopril (PRINIVIL,ZESTRIL) 5 MG tablet Take 5 mg by mouth  daily.    . Multiple Vitamin (MULTIVITAMIN) tablet Take 1 tablet by mouth daily.    . potassium chloride SA (KLOR-CON M20) 20 MEQ tablet Take 1 tablet (20 mEq total) by mouth daily. 90 tablet 3  . traZODone (DESYREL) 50 MG tablet Take 1 tablet (50 mg total) by mouth at bedtime as needed for sleep. (Patient not taking: Reported on 01/18/2018) 20 tablet 0   No current facility-administered medications for this visit.     PHYSICAL EXAMINATION: ECOG PERFORMANCE STATUS: 1  Vitals:   01/18/18 1532  BP: (!) 98/58  Pulse: 77  Resp: 18  Temp: 98.2 F (36.8 C)  SpO2: 100%   Filed Weights   01/18/18 1532  Weight: 226 lb (102.5 kg)    GENERAL:alert, no distress and comfortable  SKIN: skin color, texture, turgor are normal, no rashes or significant lesions EYES: normal, Conjunctiva are pink and non-injected, sclera clear OROPHARYNX:no exudate,  no erythema and lips, buccal mucosa, and tongue normal  NECK: supple, thyroid normal size, non-tender, without nodularity LYMPH:  no palpable lymphadenopathy in the cervical, axillary or inguinal LUNGS: clear to auscultation and percussion with normal breathing effort  HEART: regular rate & rhythm and no murmurs and no lower extremity edema ABDOMEN:abdomen soft, non-tender and normal bowel sounds Musculoskeletal:no cyanosis of digits and no clubbing  NEURO: alert & oriented x 3 with fluent speech, no focal motor/sensory deficits  LABORATORY DATA:  I have reviewed the data as listed CBC Latest Ref Rng & Units 01/15/2018 12/07/2017 11/08/2017  WBC 4.0 - 10.5 K/uL 6.3 5.7 6.4  Hemoglobin 13.0 - 17.0 g/dL 10.2(L) 10.1(L) 11.0(L)  Hematocrit 39.0 - 52.0 % 31.1(L) 29.9(L) 32.9(L)  Platelets 150 - 400 K/uL 315 141 160     CMP Latest Ref Rng & Units 01/15/2018 12/07/2017 11/17/2017  Glucose 70 - 99 mg/dL 98 137(H) 110(H)  BUN 8 - 23 mg/dL 19 25(H) 23  Creatinine 0.61 - 1.24 mg/dL 1.58(H) 2.44(H) 1.49(H)  Sodium 135 - 145 mmol/L 140 140 139  Potassium 3.5  - 5.1 mmol/L 4.0 4.0 3.4(L)  Chloride 98 - 111 mmol/L 100 103 102  CO2 22 - 32 mmol/L '30 28 26  '$ Calcium 8.9 - 10.3 mg/dL 9.6 9.2 9.4  Total Protein 6.5 - 8.1 g/dL 7.7 7.9 7.5  Total Bilirubin 0.3 - 1.2 mg/dL 0.3 0.4 1.0  Alkaline Phos 38 - 126 U/L 84 78 71  AST 15 - 41 U/L '20 27 27  '$ ALT 0 - 44 U/L '17 26 29   '$ Results for Logan [name], Logan [name] (MRN [id number]) as of 10/19/2017 17:28  Ref. Range 09/26/2017 21:31  Prostatic Specific Antigen Latest Ref Range: 0.00 - 4.00 ng/mL 84.83 (H)     PATHOLOGY  10/02/2017 Molecular Pathology    10/02/2017 Surgical Pathology Diagnosis Liver, needle/core biopsy, Left Hepatic Lobe - METASTATIC LUNG ADENOCARCINOMA TO THE LIVER. SEE NOTE Diagnosis Note Immunohistochemical stains show that the tumor cells are positive for CK7, TTF-1 and Napsin A; while they are negative for CK20 and Hepar, consistent with the above diagnosis Specimen(s) Obtained: Liver, needle/core biopsy, Left Hepatic Lobe Specimen Clinical Information Right lung lesion with liver lesions, concern for metastatic disease (nt) Gross Received in formalin are four cores of pale tan-pink soft tissue ranging from 0.9 to 1.1 cm, submitted entirely in two cassettes. (AK:ah 10/02/17) Stain(s) used in Diagnosis: The following stain(s) were used in diagnosing the case: Thyroid Transcription Factor -1, Napsin-A, HEPAR-1, CK-7, CK 20. The control(s) stained appropriately.   RADIOGRAPHIC STUDIES: I have personally reviewed the radiological images as listed and agreed with the findings in the report.  01/10/2018 PET Scan IMPRESSION: 1. Marked reduction in size of the right lower lobe mass with fairly low metabolic activity in the residual mass, maximum SUV 5.3. Notably reduced thoracic adenopathy which is not currently hypermetabolic. 2. Marked reduction in size and conspicuity of the hepatic tumors, both of which have metabolic activity similar to that of the background liver  parenchyma. 3. Resolution of the splenic mass. No additional or new lesions are identified. 4. Accentuated activity anteriorly in the left seventh rib, low-grade with maximum SUV 3.5, without appreciable CT abnormality on the CT data, significance uncertain. 5. Accentuated activity at the right sternoclavicular joint, SUV 7.2, probably from arthropathy. 6. Accentuated activity in the posterior and apical portions of the prostate gland, significance uncertain. 7. Other imaging findings of potential clinical significance: Chronic left maxillary sinusitis.  10/10/2017 MRI  Brain WO Contrast IMPRESSION: 1. Single subcentimeter focus of diffusion restriction in the left cerebellum is favored to be an acute ischemic infarct, given the lack of surrounding edema. A small metastasis could also have this appearance, but perilesional edema is typically much greater. If there are no contraindications, contrast administration might be helpful. 2. Otherwise normal brain for age.  09/27/2017 CT AP W WO Contrast IMPRESSION: 1. Large complex masses in the right lower lobe of the lung and in the right hepatic lobe. Smaller liver lesions in the left hepatic lobe. Nonspecific lesions in the spleen and kidneys. No significant pathologic adenopathy. This would not be a characteristic metastatic pattern for prostate cancer and is not a specific metastatic and imaging pattern to suggest an individual etiology. Possibilities might include metastatic lung cancer, melanoma, metastatic hepatocellular carcinoma, or a variety of other possibilities. Tissue diagnosis recommended. 2. Moderate prostatomegaly. 3. Scattered mild ascites. 4. Moderate cardiomegaly.  Small inferior pericardial effusion. 5. Trace bilateral pleural effusions.  09/26/2017 CT Chest WO Contrast IMPRESSION: 1. Large RIGHT lower lobe mass with central and peripheral calcifications occupies the near entirety of the RIGHT lower lobe and  concerning for neoplasm. 2. Moderate RIGHT effusion. 3. Mild mediastinal and RIGHT hilar adenopathy. 4. Recommend FDG PET scan for biopsy planning/staging  09/22/2017 CXR IMPRESSION: 1.Moderate right pleural effusion with right lower/middle lobe consolidation. Minimal patchy opacities in left lower lobe. 2.Cardiomegaly.  08/21/2015 CXR IMPRESSION: Right lower lobe mass.  04/07/2015 PET Scan IMPRESSION:  Hypermetabolic right lower lobe mass Hypermetabolic left adrenal nodule.  01/20/2015 CXR IMPRESSION: Significant improvement comparatively, now with smaller pleural effusions and resolving pulmonary edema. Known right lower lobe mass suspicious for bronchogenic carcinoma is redemonstrated.  04/30/2014 CXR IMPRESSION: Enlarging right lower lobe nodule. Would recommend PET CT to further evaluate.  04/01/2013 CXR IMPRESSION: 1. Cardiomegaly with diffuse increased interstitial markings consistent with pulmonary edema. 2. 5-6 cm rounded density seen in the right lower lobe medially. This could be related to an area of consolidation correlate clinically for pneumonia.. But the rounded and well-defined borders are suspicious for mass. Followup x-ray suggested after  treating for the pulmonary edema/pneumonia to see if this resolves.   ASSESSMENT & PLAN:  Logan [name] is a 67 y.o. male who presented to the ED on 09/26/17 for worsening SOB and leg swelling. He has a h/o a lung mass since 2015 that has not been evaluated and H/o prostate cancer on observation.   1. 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 -We discussed that his cancer is not curable at this stage, and the goal of therapy is palliative, to prolong his life and preserve his quality of life. -Due to his advanced heart failure, he is a good candidate for intensive chemotherapy. -tumor NGS showed ALK translocation (+) --he started Alectinib on  10/21/2017, tolerating very well  -I reviewed his restaging PET scan from 10/30.2019 images with pt and his wife in detail, it showed excellent partial response, his liver metastasis are not hypermetabolic now. -Labs reviewed. CBC showed Hg 10.2 Hct 31.1 WBC 6.3 and platelets 315K. CMP showed BUN 19 Cr 1.58  -continue Alectinib  -We discussed that he will  developed resistance to Alectinib in the future, and we will consider third generation ALK inhibitor or other therapy option  -f/u in 2 months   2.  History of untreated prostate cancer, with rising PSA -will f/u PSA   3.  Bilateral pleural effusion,R>L, likely related to CHF -s/p thoracentesis 7/17 with cytology showing reactive mesothelial cells - stable on f/u CXR. -he is on lasix   4. 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 Aundra Dubin  -Currently on digoxin, Lasix, spironolactone and Cozaar -He recently was hospitalized for CHF exacerbation and had cardiac arrest, he was resuscitated and recovered well.   5. Atrial Fibrillation -Was on Xarelto but off due to hemoptysis -Still has hemoptysis during admission -Currently on aspirin 81, no recurrent hemoptysis, OK to restart anticoagulant if needed for her heart disease  -Continue aspirin 81 and follow-up with cardiology to decide on antiplatelet versus anticoagulation -On amiodarone and digoxin -Rate controlled  6.Incidental stroke:  left cerebellar small infarct -Resultant no neurological focal deficit -MRI punctate to small  left cerebellar infarct -MRA unremarkable -Carotid Doppler unremarkable -Xarelto (rivaroxaban) daily prior to admission (off due to hemoptysis), now on aspirin 81 mg daily.  Ideally, patient should be on anticoagulation however due to severe hemoptysis, hematuria and anemia, patient not anticoagulated candidate at this time.  Agree with cardiology plan to continue aspirin 81 and then follow-up as outpatient. -Patient counseled to be compliant with his antithrombotic medications -Ongoing aggressive stroke risk factor management  7. Goal of care discussion  -We again discussed the incurable nature of his cancer, and the overall poor prognosis, especially if he does not have good response to treatment or progressed on treatment  -The patient understands the goal of care is palliative. -He is full code now   8. CKD stage III -he is on diuretics for heart failure   Plan: -Skin reviewed, he had excellent response  -Continue alectinib  -f/u in 2 months    All questions were answered. The patient knows to call the clinic with any problems, questions or concerns. No barriers to learning was detected. I spent 25 minutes counseling the patient face to face. The total time spent in the appointment was 30 minutes and more than 50% was on counseling and review of test results  I, Noor Dweik am acting as scribe for Dr. Truitt Merle.  I have reviewed the above documentation for accuracy and completeness, and I agree with the above.     Truitt Merle, MD 01/18/2018

## 2018-01-18 ENCOUNTER — Inpatient Hospital Stay (HOSPITAL_BASED_OUTPATIENT_CLINIC_OR_DEPARTMENT_OTHER): Payer: 59 | Admitting: Hematology

## 2018-01-18 VITALS — BP 98/58 | HR 77 | Temp 98.2°F | Resp 18 | Ht 71.0 in | Wt 226.0 lb

## 2018-01-18 DIAGNOSIS — C787 Secondary malignant neoplasm of liver and intrahepatic bile duct: Secondary | ICD-10-CM | POA: Diagnosis not present

## 2018-01-18 DIAGNOSIS — I428 Other cardiomyopathies: Secondary | ICD-10-CM

## 2018-01-18 DIAGNOSIS — N183 Chronic kidney disease, stage 3 (moderate): Secondary | ICD-10-CM

## 2018-01-18 DIAGNOSIS — C3431 Malignant neoplasm of lower lobe, right bronchus or lung: Secondary | ICD-10-CM | POA: Diagnosis not present

## 2018-01-18 DIAGNOSIS — Z7982 Long term (current) use of aspirin: Secondary | ICD-10-CM

## 2018-01-18 DIAGNOSIS — C3491 Malignant neoplasm of unspecified part of right bronchus or lung: Secondary | ICD-10-CM

## 2018-01-18 DIAGNOSIS — I130 Hypertensive heart and chronic kidney disease with heart failure and stage 1 through stage 4 chronic kidney disease, or unspecified chronic kidney disease: Secondary | ICD-10-CM

## 2018-01-18 DIAGNOSIS — J90 Pleural effusion, not elsewhere classified: Secondary | ICD-10-CM

## 2018-01-18 DIAGNOSIS — I480 Paroxysmal atrial fibrillation: Secondary | ICD-10-CM

## 2018-01-18 DIAGNOSIS — C61 Malignant neoplasm of prostate: Secondary | ICD-10-CM

## 2018-01-18 DIAGNOSIS — I5022 Chronic systolic (congestive) heart failure: Secondary | ICD-10-CM

## 2018-01-20 ENCOUNTER — Encounter: Payer: Self-pay | Admitting: Hematology

## 2018-01-22 ENCOUNTER — Encounter (HOSPITAL_COMMUNITY): Payer: Self-pay

## 2018-01-22 ENCOUNTER — Other Ambulatory Visit: Payer: Self-pay

## 2018-01-22 ENCOUNTER — Ambulatory Visit (HOSPITAL_COMMUNITY)
Admission: RE | Admit: 2018-01-22 | Discharge: 2018-01-22 | Disposition: A | Discharge status: Home / Self Care | Payer: 59 | Source: Ambulatory Visit | Attending: Cardiology | Admitting: Cardiology

## 2018-01-22 VITALS — BP 100/60 | HR 71 | Wt 229.6 lb

## 2018-01-22 DIAGNOSIS — Z7901 Long term (current) use of anticoagulants: Secondary | ICD-10-CM | POA: Diagnosis not present

## 2018-01-22 DIAGNOSIS — Z8673 Personal history of transient ischemic attack (TIA), and cerebral infarction without residual deficits: Secondary | ICD-10-CM | POA: Insufficient documentation

## 2018-01-22 DIAGNOSIS — Z8674 Personal history of sudden cardiac arrest: Secondary | ICD-10-CM | POA: Diagnosis not present

## 2018-01-22 DIAGNOSIS — C3490 Malignant neoplasm of unspecified part of unspecified bronchus or lung: Secondary | ICD-10-CM | POA: Diagnosis not present

## 2018-01-22 DIAGNOSIS — I110 Hypertensive heart disease with heart failure: Secondary | ICD-10-CM | POA: Diagnosis not present

## 2018-01-22 DIAGNOSIS — R918 Other nonspecific abnormal finding of lung field: Secondary | ICD-10-CM

## 2018-01-22 DIAGNOSIS — I5022 Chronic systolic (congestive) heart failure: Secondary | ICD-10-CM | POA: Diagnosis present

## 2018-01-22 DIAGNOSIS — I428 Other cardiomyopathies: Secondary | ICD-10-CM | POA: Insufficient documentation

## 2018-01-22 DIAGNOSIS — Z79899 Other long term (current) drug therapy: Secondary | ICD-10-CM | POA: Insufficient documentation

## 2018-01-22 DIAGNOSIS — G4733 Obstructive sleep apnea (adult) (pediatric): Secondary | ICD-10-CM | POA: Diagnosis not present

## 2018-01-22 DIAGNOSIS — Z8249 Family history of ischemic heart disease and other diseases of the circulatory system: Secondary | ICD-10-CM | POA: Diagnosis not present

## 2018-01-22 DIAGNOSIS — I480 Paroxysmal atrial fibrillation: Secondary | ICD-10-CM | POA: Diagnosis not present

## 2018-01-22 DIAGNOSIS — R042 Hemoptysis: Secondary | ICD-10-CM | POA: Diagnosis not present

## 2018-01-22 DIAGNOSIS — R972 Elevated prostate specific antigen [PSA]: Secondary | ICD-10-CM | POA: Insufficient documentation

## 2018-01-22 DIAGNOSIS — Z7982 Long term (current) use of aspirin: Secondary | ICD-10-CM | POA: Diagnosis not present

## 2018-01-22 DIAGNOSIS — N179 Acute kidney failure, unspecified: Secondary | ICD-10-CM | POA: Insufficient documentation

## 2018-01-22 DIAGNOSIS — I5082 Biventricular heart failure: Secondary | ICD-10-CM | POA: Diagnosis not present

## 2018-01-22 DIAGNOSIS — Z8546 Personal history of malignant neoplasm of prostate: Secondary | ICD-10-CM | POA: Diagnosis not present

## 2018-01-22 DIAGNOSIS — Z87891 Personal history of nicotine dependence: Secondary | ICD-10-CM | POA: Diagnosis not present

## 2018-01-22 DIAGNOSIS — C787 Secondary malignant neoplasm of liver and intrahepatic bile duct: Secondary | ICD-10-CM | POA: Diagnosis not present

## 2018-01-22 MED ORDER — SACUBITRIL-VALSARTAN 24-26 MG PO TABS: 1.0000 | ORAL_TABLET | Freq: Two times a day (BID) | ORAL | 6 refills | Status: DC

## 2018-01-22 MED ORDER — AMIODARONE HCL 100 MG PO TABS: 100.0000 mg | ORAL_TABLET | Freq: Every day | ORAL | 6 refills | Status: DC

## 2018-01-22 NOTE — Patient Instructions (Addendum)
STOP Lisinopril WAIT 36 Hours then  START Entresto 24/26 mg, one tab twice a day  DECREASE Amiodarone to 100 mg ,one tab daily  Be sure to take an additional 40 mg of Lasix for 3 lb weight gain overnight or 5 lbs in a week  You have been referred for a CardioMems device, please read over the hand out provided. Once the procedure has been approved we will reach out to you for scheduling.  Labs needed in 7-10 days  Your physician recommends that you schedule a follow-up appointment in: 3 weeks  in the [facility] ([facility]NP[facility]    Do the following things EVERYDAY: 1) Weigh yourself in the morning before breakfast. Write it down and keep it in a log. 2) Take your medicines as prescribed 3) Eat low salt foods-Limit salt (sodium) to 2000 mg per day.  4) Stay as active as you can everyday 5) Limit all fluids for the day to less than 2 liters

## 2018-01-22 NOTE — Progress Notes (Signed)
Advanced Heart Failure Clinic Note   Referring Physician: PCP: Logan Keens, MD PCP-Cardiologist: Logan Grooms, MD  HF: Dr Logan [name]  HPI: [name] is 67 year old with history of HTN, A Fib, OSA, untreated prostate cancer 2012 , RLL mass 2015 declined treatment, hemoptysis for the last 6 months (he stopped xarelto), and chronic systolic heart failure.   Admitted 8/33-10/13/48 with A/C systolic HF. Required thoracentesis 7/17. Echo showed EF 15% with biventricular failure. Course complicated by Afib RVR and associated flash pulmonary edema. Required intubation and pressors. Diuresed with IV lasix. Oncology consulted for lung cancer (RLL) with liver mets. He is not a candidate for chemotherapy, but might be candidate for immunotherapy depending on genomics. MRI brain was negative for mets, but showed small left cerebellar infarct. He had hemoptysis with heparin, so he was only discharged on ASA 81 mg daily. HF meds were optimized.  He saw Dr Logan [name] recently and was started on Alectinib, which has potential cardiac toxicity, especially bradycardia.   Admitted to [facility] with increased dyspnea on 10/22. Had PEA and required intubation. LHC with no coronary diseased. LVEDP was 40 so he was diuresed with IV las and later transitioned to 40 mg po lasix. Discharge weight 221 pound. He was discharged to home  01/06/2018 .   Today he returns for HF follow up. He has been hospitalized since the last admit. See above.  day, digoxin and spiro started. Overall feeling ok but thinks he is picking up fluid. Coughing at night. Denies PND/Orthopnea. Having increased abdominal bloating.  Appetite ok. No fever or chills. Weight at home 219-->222 pounds. Taking all medications.     LHC at [facility] 01/02/2018 Coronaries ok.   ECHO 1022/2019 EF 25-30% ECHO 09/2017 EF 15%.   Review of systems complete and found to be negative unless listed in HPI.   Past Medical History:  Diagnosis Date  . CHF  (congestive heart failure) ([place])   . Chronic systolic heart failure ([place]) 04/15/2015    EF 30-35% by echo 2016 , [facility]   . Hypertension   . Lung mass 04/15/2015    Never biopsied. History of PET scan that raised concern.   . Metabolic syndrome 07/14/9765  . OSA on CPAP   . Paroxysmal atrial fibrillation ([place]) 04/15/2015   On apixaban   . Prostate cancer ([place])   . Thrombocytopenia ([place]) 04/15/2015    Current Outpatient Medications  Medication Sig Dispense Refill  . ALECENSA 150 MG capsule TAKE 4 CAPSULES (600 MG TOTAL) BY MOUTH 2 TIMES DAILY WITH A MEAL 240 capsule 0  . amiodarone (PACERONE) 200 MG tablet Take 1 tablet (200 mg total) by mouth daily. 90 tablet 3  . apixaban (ELIQUIS) 5 MG TABS tablet Take 1 tablet (5 mg total) by mouth 2 (two) times daily. 60 tablet 6  . Cholecalciferol (CVS VIT D 5000 HIGH-POTENCY) 5000 units capsule Take 5,000 Units by mouth daily.    . digoxin (LANOXIN) 0.125 MG tablet Take 1 tablet (0.125 mg total) by mouth daily. 90 tablet 3  . furosemide (LASIX) 80 MG tablet Take 80 mg by mouth. 0.5 tablet in the morning 0.5 tablet at night    . lisinopril (PRINIVIL,ZESTRIL) 5 MG tablet Take 5 mg by mouth daily.    . Multiple Vitamin (MULTIVITAMIN) tablet Take 1 tablet by mouth daily.    . potassium chloride SA (KLOR-CON M20) 20 MEQ tablet Take 1 tablet (20 mEq total) by mouth daily. 90 tablet 3  . traZODone (  DESYREL) 50 MG tablet Take 1 tablet (50 mg total) by mouth at bedtime as needed for sleep. 20 tablet 0   No current facility-administered medications for this encounter.     No Known Allergies    Social History   Socioeconomic History  . Marital status: Married    Spouse name: Not on file  . Number of children: Not on file  . Years of education: Not on file  . Highest education level: Not on file  Occupational History  . Not on file  Social Needs  . Financial resource strain: Not on file  . Food insecurity:    Worry: Not on file    Inability: Not on  file  . Transportation needs:    Medical: Not on file    Non-medical: Not on file  Tobacco Use  . Smoking status: Former Smoker    Years: 6.50    Types: Cigarettes, Cigars, Pipe    Last attempt to quit: 03/14/1974    Years since quitting: 43.8  . Smokeless tobacco: Never Used  Substance and Sexual Activity  . Alcohol use: Yes    Alcohol/week: 0.0 standard drinks    Comment: 09/26/2017 "holidays only"  . Drug use: Never  . Sexual activity: Yes  Lifestyle  . Physical activity:    Days per week: Not on file    Minutes per session: Not on file  . Stress: Not on file  Relationships  . Social connections:    Talks on phone: Not on file    Gets together: Not on file    Attends religious service: Not on file    Active member of club or organization: Not on file    Attends meetings of clubs or organizations: Not on file    Relationship status: Not on file  . Intimate partner violence:    Fear of current or ex partner: Not on file    Emotionally abused: Not on file    Physically abused: Not on file    Forced sexual activity: Not on file  Other Topics Concern  . Not on file  Social History Narrative  . Not on file      Family History  Problem Relation Age of Onset  . Parkinson's disease Mother   . Hypertension Father     Vitals:   01/22/18 0854  BP: 100/60  Pulse: 71  SpO2: 98%  Weight: 104.1 kg (229 lb 9.6 oz)   Wt Readings from Last 3 Encounters:  01/22/18 104.1 kg (229 lb 9.6 oz)  01/18/18 102.5 kg (226 lb)  12/07/17 107.3 kg (236 lb 9.6 oz)    PHYSICAL EXAM: General:  Well appearing. No resp difficulty. Walked in the clinic.  HEENT: normal Neck: supple. JVP 7-8 . Carotids 2+ bilat; no bruits. No lymphadenopathy or thryomegaly appreciated. Cor: PMI nondisplaced. Regular rate & rhythm. No rubs, gallops or murmurs. Lungs: clear Abdomen: soft, nontender, distended. No hepatosplenomegaly. No bruits or masses. Good bowel sounds. Extremities: no cyanosis, clubbing,  rash, edema Neuro: alert & orientedx3, cranial nerves grossly intact. moves all 4 extremities w/o difficulty. Affect pleasant  EKG: SR  63 BPM 1st degree heart block 250 ms   ASSESSMENT & PLAN:  1. Chronic systolic CHF: -  TEE 2/12 with EF 15%. Long history of nonischemic cardiomyopathy. No ICD. He was admitted with afib/RVR and dyspnea. Failed TEE-guided DCCV and intubated likely due to pulmonary edema.  - Recent hospitalization 12/2017 with increased dyspnea and PEA arrest. Diuresed with IV lasix.  NYHA IIIb. Mild volume overload today. Continue 40 mg po lasix with extra 40 mg lasix for 3 piound weight gain.  - stop lisinopril.36 hour washout then start entresto 24-26 mg twice a day . Check BMET in 7 days.  -Continue digoxin 0.125 mg daily. Dig level 0.5 10/31/17  -Long-term prognosis with combination of metastatic lung cancer and severe CHF(low output) is very poor. He is not a candidate for advanced cardiac therapies. Would not recommend ICD - I do think cardiomems would be an option. Today we discussed cardiomems and he he would like to pursue. Application for cardiomems started.  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 Seen by oncology, not candidate for chemotherapy but may be candidate for immunotherapy depending on genomics.  - MR brain negative for mets.  - Follows with Dr Logan [name]. Tumor is getting smaller. Continue current regimen.  -3. Hemoptysis: - Resolved.  - He was put on ASA 81 when small CVA found on MRI, so far no recurrent hemoptysis.  - Now back on Eliquis.  4. Paroxysmal Atrial fibrillation:  - Initially with RVR, likely was driving CHF. He failed DCCV but then with additionofamiodaronehad gone back to NSR on 7/24, buton 7/25 he wasback inafib.  -Cut back amio to 100 mg daily.  - Continue Eliquis 5 mg BID. Denies bleeding. Hemoglobin stable 8/28 at 11.0 5. Recent AKI: - Check BMET in 7 days.  6. H/o elevated PSA:  - Has declined workup  in past. No change.   7. Recent acute hypoxemic respiratory failure:  - Suspect due to pulmonary edema+ large RLL mass/hemoptysis.  - Resolved  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  Likely due to atrial fibrillation.  He is off anticoagulation due to severe hemoptysis from lung mass.  - Continue Eliquis 5 mg BID. Denies bleeding.  Follow up in 2-3 weeks. Place cardiomems once approved to help manage heart failure/volume overload.    Darrick Grinder, NP 01/22/18

## 2018-01-23 NOTE — Progress Notes (Signed)
Cardiology Office Note:    Date:  01/24/2018   ID:  Logan [name], DOB [birth year], MRN [id number]  PCP:  [name], MD  Cardiologist:  Sinclair Grooms, MD   Referring MD: [name], MD   Chief Complaint  Patient presents with  . Congestive Heart Failure    History of Present Illness:    Logan [name] is a 67 y.o. 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  Is just seen in the heart failure clinic yesterday by Darrick Grinder NP.  Delene Loll was started.  He has been off of lisinopril for 48 hours.  He was unable to obtain [facility] yesterday with his car due to [facility] blocking medication distribution until a physician conference can be arranged.  He is not short of breath.  Weight is been stable.  We had a long conversation concerning the importance of salt and volume management.  Also discussed the importance of diuretic therapy.  Past Medical History:  Diagnosis Date  . CHF (congestive heart failure) ([place])   . Chronic systolic heart failure ([place]) 04/15/2015    EF 30-35% by echo 2016 , [facility]   . Hypertension   . Lung mass 04/15/2015    Never biopsied. History of PET scan that raised concern.   . Metabolic syndrome 09/19/2954  . OSA on CPAP   . Paroxysmal atrial fibrillation ([place]) 04/15/2015   On apixaban   . Prostate cancer ([place])   . Thrombocytopenia ([place]) 04/15/2015    Past Surgical History:  Procedure Laterality Date  . CARDIAC CATHETERIZATION N/A 04/27/2015   Procedure: Right/Left Heart Cath and Coronary Angiography;  Surgeon: [name], MD;  Location: [facility];  Service: Cardiovascular;  Laterality: N/A;  . CARDIOVERSION N/A 09/29/2017   Procedure: CARDIOVERSION;  Surgeon: Pixie Casino, MD;  Location: [place];   Service: Cardiovascular;  Laterality: N/A;  . IR THORACENTESIS ASP PLEURAL SPACE W/IMG GUIDE  09/27/2017  . PROSTATE BIOPSY    . TEE WITHOUT CARDIOVERSION N/A 09/29/2017   Procedure: TRANSESOPHAGEAL ECHOCARDIOGRAM (TEE);  Surgeon: Pixie Casino, MD;  Location: [facility]OSCOPY;  Service: Cardiovascular;  Laterality: N/A;    Current Medications: Current Meds  Medication Sig  . ALECENSA 150 MG capsule TAKE 4 CAPSULES (600 MG TOTAL) BY MOUTH 2 TIMES DAILY WITH A MEAL  . amiodarone (PACERONE) 100 MG tablet Take 1 tablet (100 mg total) by mouth daily.  Marland Kitchen apixaban (ELIQUIS) 5 MG TABS tablet Take 1 tablet (5 mg total) by mouth 2 (two) times daily.  . Cholecalciferol (CVS VIT D 5000 HIGH-POTENCY) 5000 units capsule Take 5,000 Units by mouth daily.  . digoxin (LANOXIN) 0.125 MG tablet Take 1 tablet (0.125 mg total) by mouth daily.  . furosemide (LASIX) 80 MG tablet Take 80 mg by mouth. 0.5 tablet in the morning 0.5 tablet at night  . Multiple Vitamin (MULTIVITAMIN) tablet Take 1 tablet by mouth daily.  . potassium chloride SA (KLOR-CON M20) 20 MEQ tablet Take 1 tablet (20 mEq total) by mouth daily.  . sacubitril-valsartan (ENTRESTO) 24-26 MG Take 1 tablet by mouth 2 (two) times daily.  . traZODone (DESYREL) 50 MG tablet Take 1 tablet (50 mg total) by mouth at bedtime as needed for sleep.     Allergies:   Patient has no known  allergies.   Social History   Socioeconomic History  . Marital status: Married    Spouse name: Not on file  . Number of children: Not on file  . Years of education: Not on file  . Highest education level: Not on file  Occupational History  . Not on file  Social Needs  . Financial resource strain: Not on file  . Food insecurity:    Worry: Not on file    Inability: Not on file  . Transportation needs:    Medical: Not on file    Non-medical: Not on file  Tobacco Use  . Smoking status: Former Smoker    Years: 6.50    Types: Cigarettes, Cigars, Pipe    Last attempt  to quit: 03/14/1974    Years since quitting: 43.8  . Smokeless tobacco: Never Used  Substance and Sexual Activity  . Alcohol use: Yes    Alcohol/week: 0.0 standard drinks    Comment: 09/26/2017 "holidays only"  . Drug use: Never  . Sexual activity: Yes  Lifestyle  . Physical activity:    Days per week: Not on file    Minutes per session: Not on file  . Stress: Not on file  Relationships  . Social connections:    Talks on phone: Not on file    Gets together: Not on file    Attends religious service: Not on file    Active member of club or organization: Not on file    Attends meetings of clubs or organizations: Not on file    Relationship status: Not on file  Other Topics Concern  . Not on file  Social History Narrative  . Not on file     Family History: The patient's  family history includes Hypertension in his father; Parkinson's disease in his mother.  ROS:   Please see the history of present illness.    He denies hemoptysis.  Appetite is been stable.  Adenocarcinoma of the lung is metastatic but has had a favorable response to checkpoint inhibitor, Alectinib.  All other systems reviewed and are negative.  EKGs/Labs/Other Studies Reviewed:    The following studies were reviewed today: No new data. Records from the October admission to [facility] were reviewed in detail:  Repeat heart catheterization demonstrated widely patent coronary arteries.  LVEF documented to be less than 20% by echo.  EKG:  EKG is not ordered today.    Recent Labs: 10/03/2017: B Natriuretic Peptide 362.5 10/11/2017: Magnesium 2.2 11/17/2017: TSH 5.745 01/15/2018: ALT 17; BUN 19; Creatinine 1.58; Hemoglobin 10.2; Platelet Count 315; Potassium 4.0; Sodium 140  Recent Lipid Panel    Component Value Date/Time   CHOL 155 02/07/2017 0904   TRIG 104 10/03/2017 2347   HDL 36 (L) 02/07/2017 0904   CHOLHDL 4.3 02/07/2017 0904   LDLCALC 94 02/07/2017 0904    Physical Exam:    VS:  BP 96/62    Pulse 68   Ht 5\' 11"  (1.803 m)   Wt 225 lb 12.8 oz (102.4 kg)   SpO2 97%   BMI 31.49 kg/m     Wt Readings from Last 3 Encounters:  01/24/18 225 lb 12.8 oz (102.4 kg)  01/22/18 229 lb 9.6 oz (104.1 kg)  01/18/18 226 lb (102.5 kg)     GEN: Appears healthy.  Well nourished, well developed in no acute distress HEENT: Normal NECK: No JVD. LYMPHATICS: No lymphadenopathy CARDIAC: RRR, no murmur, no gallop, no edema. VASCULAR: 2+ bilateral radial and carotid pulses.  No bruits. RESPIRATORY:  Clear to auscultation without rales, wheezing or rhonchi  ABDOMEN: Soft, non-tender, non-distended, No pulsatile mass, MUSCULOSKELETAL: No deformity  SKIN: Warm and dry NEUROLOGIC:  Alert and oriented x 3 PSYCHIATRIC:  Normal affect   ASSESSMENT:    1. Chronic systolic heart failure (HCC)   2. Paroxysmal atrial fibrillation ([place])   3. NICM (nonischemic cardiomyopathy) ([place])   4. Chronic anticoagulation   5. 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 Prostate cancer ([place])   7. Adenocarcinoma of right lung ([place])   8. Long term current use of amiodarone    PLAN:    In order of problems listed above:  1. Provided samples of Entresto 24/26 mg twice daily.  He should take a tablet once he arrives home and again this evening.  Is been off of lisinopril for at least 48 hours.  Prior authorization process is in progress.  I had a long conversation with the patient and expressed that he should follow with heart failure clinic exclusively until he is return to my care.  I am here as back-up if needed.  We had a very long and useful conversation concerning the importance of salt and volume control.  Also discussed the importance of continuing diuretic therapy to avoid volume overload.  I encouraged him to be not as concerned about the impact that diuretic therapy can have on kidney function, but rather the improvement in functional status that it provides when  allowing [place] to maintain adequate volume status.  PRN follow-up.  Long conversation with the family concerning overall management strategy.  Advanced heart failure team is in charge.  We appreciate the help.   Medication Adjustments/Labs and Tests Ordered: Current medicines are reviewed at length with the patient today.  Concerns regarding medicines are outlined above.  No orders of the defined types were placed in this encounter.  No orders of the defined types were placed in this encounter.   Patient Instructions  Medication Instructions:  No changes - please start Entresto (samples provided)  If you need a refill on your cardiac medications before your next appointment, please call your pharmacy.   Lab work: none If you have labs (blood work) drawn today and your tests are completely normal, you will receive your results only by: Marland Kitchen MyChart Message (if you have MyChart) OR . A paper copy in the mail If you have any lab test that is abnormal or we need to change your treatment, we will call you to review the results.  Testing/Procedures: none  Follow-Up: At [facility], you and your health needs are our priority.  As part of our continuing mission to provide you with exceptional heart care, we have created designated Provider Care Teams.  These Care Teams include your primary Cardiologist (physician) and Advanced Practice Providers (APPs -  Physician Assistants and Nurse Practitioners) who all work together to provide you with the care you need, when you need it. You will need a follow up appointment in 12 months/as needed. Please call our office 2 months in advance to schedule this appointment.  You may see Sinclair Grooms, MD or one of the following Advanced Practice Providers on your designated Care Team:   Truitt Merle, NP Cecilie Kicks, NP . Kathyrn Drown, NP Your physician recommends that you keep scheduled follow up at [facility].   Any Other Special  Instructions Will Be Listed Below (If Applicable).  Signed, Sinclair Grooms, MD  01/24/2018 11:52 AM    [place]

## 2018-01-24 ENCOUNTER — Encounter: Payer: Self-pay | Admitting: Interventional Cardiology

## 2018-01-24 ENCOUNTER — Ambulatory Visit (INDEPENDENT_AMBULATORY_CARE_PROVIDER_SITE_OTHER): Payer: 59 | Admitting: Interventional Cardiology

## 2018-01-24 VITALS — BP 96/62 | HR 68 | Ht 71.0 in | Wt 225.8 lb

## 2018-01-24 DIAGNOSIS — C3491 Malignant neoplasm of unspecified part of right bronchus or lung: Secondary | ICD-10-CM

## 2018-01-24 DIAGNOSIS — I428 Other cardiomyopathies: Secondary | ICD-10-CM | POA: Diagnosis not present

## 2018-01-24 DIAGNOSIS — I480 Paroxysmal atrial fibrillation: Secondary | ICD-10-CM | POA: Diagnosis not present

## 2018-01-24 DIAGNOSIS — Z79899 Other long term (current) drug therapy: Secondary | ICD-10-CM

## 2018-01-24 DIAGNOSIS — I5022 Chronic systolic (congestive) heart failure: Secondary | ICD-10-CM

## 2018-01-24 DIAGNOSIS — I63442 Cerebral infarction due to embolism of left cerebellar artery: Secondary | ICD-10-CM

## 2018-01-24 DIAGNOSIS — Z7901 Long term (current) use of anticoagulants: Secondary | ICD-10-CM | POA: Diagnosis not present

## 2018-01-24 DIAGNOSIS — C61 Malignant neoplasm of prostate: Secondary | ICD-10-CM

## 2018-01-24 NOTE — Patient Instructions (Signed)
Medication Instructions:  No changes - please start Entresto (samples provided)  If you need a refill on your cardiac medications before your next appointment, please call your pharmacy.   Lab work: none If you have labs (blood work) drawn today and your tests are completely normal, you will receive your results only by: Marland Kitchen MyChart Message (if you have MyChart) OR . A paper copy in the mail If you have any lab test that is abnormal or we need to change your treatment, we will call you to review the results.  Testing/Procedures: none  Follow-Up: At [facility], you and your health needs are our priority.  As part of our continuing mission to provide you with exceptional heart care, we have created designated Provider Care Teams.  These Care Teams include your primary Cardiologist (physician) and Advanced Practice Providers (APPs -  Physician Assistants and Nurse Practitioners) who all work together to provide you with the care you need, when you need it. You will need a follow up appointment in 12 months/as needed. Please call our office 2 months in advance to schedule this appointment.  You may see Sinclair Grooms, MD or one of the following Advanced Practice Providers on your designated Care Team:   Truitt Merle, NP Cecilie Kicks, NP . Kathyrn Drown, NP Your physician recommends that you keep scheduled follow up at [facility].   Any Other Special Instructions Will Be Listed Below (If Applicable).

## 2018-01-30 ENCOUNTER — Telehealth (HOSPITAL_COMMUNITY): Payer: Self-pay | Admitting: Pharmacist

## 2018-01-30 NOTE — Telephone Encounter (Signed)
Entresto 24-26 mg BID PA approved by [facility] Part D through 01/29/19.   Ruta Hinds. Velva Harman, PharmD, BCPS, CPP Clinical Pharmacist Phone: [phone] 01/30/2018 9:05 AM

## 2018-02-02 ENCOUNTER — Other Ambulatory Visit: Payer: Self-pay | Admitting: Interventional Cardiology

## 2018-02-14 ENCOUNTER — Ambulatory Visit: Payer: 59 | Admitting: Nurse Practitioner

## 2018-02-19 ENCOUNTER — Encounter (HOSPITAL_COMMUNITY): Payer: Self-pay

## 2018-02-19 ENCOUNTER — Ambulatory Visit (HOSPITAL_COMMUNITY)
Admission: RE | Admit: 2018-02-19 | Discharge: 2018-02-19 | Disposition: A | Discharge status: Home / Self Care | Payer: 59 | Source: Ambulatory Visit | Attending: Cardiology | Admitting: Cardiology

## 2018-02-19 ENCOUNTER — Telehealth: Payer: Self-pay

## 2018-02-19 ENCOUNTER — Telehealth (HOSPITAL_COMMUNITY): Payer: Self-pay | Admitting: Cardiology

## 2018-02-19 VITALS — BP 100/68 | HR 70 | Wt 229.8 lb

## 2018-02-19 DIAGNOSIS — R972 Elevated prostate specific antigen [PSA]: Secondary | ICD-10-CM | POA: Diagnosis not present

## 2018-02-19 DIAGNOSIS — Z87891 Personal history of nicotine dependence: Secondary | ICD-10-CM | POA: Insufficient documentation

## 2018-02-19 DIAGNOSIS — Z7901 Long term (current) use of anticoagulants: Secondary | ICD-10-CM | POA: Insufficient documentation

## 2018-02-19 DIAGNOSIS — Z8546 Personal history of malignant neoplasm of prostate: Secondary | ICD-10-CM | POA: Insufficient documentation

## 2018-02-19 DIAGNOSIS — J9601 Acute respiratory failure with hypoxia: Secondary | ICD-10-CM | POA: Insufficient documentation

## 2018-02-19 DIAGNOSIS — I428 Other cardiomyopathies: Secondary | ICD-10-CM | POA: Diagnosis not present

## 2018-02-19 DIAGNOSIS — Z79899 Other long term (current) drug therapy: Secondary | ICD-10-CM | POA: Insufficient documentation

## 2018-02-19 DIAGNOSIS — Z8249 Family history of ischemic heart disease and other diseases of the circulatory system: Secondary | ICD-10-CM | POA: Insufficient documentation

## 2018-02-19 DIAGNOSIS — G4733 Obstructive sleep apnea (adult) (pediatric): Secondary | ICD-10-CM | POA: Diagnosis not present

## 2018-02-19 DIAGNOSIS — I480 Paroxysmal atrial fibrillation: Secondary | ICD-10-CM | POA: Diagnosis not present

## 2018-02-19 DIAGNOSIS — C3490 Malignant neoplasm of unspecified part of unspecified bronchus or lung: Secondary | ICD-10-CM | POA: Insufficient documentation

## 2018-02-19 DIAGNOSIS — C787 Secondary malignant neoplasm of liver and intrahepatic bile duct: Secondary | ICD-10-CM | POA: Diagnosis not present

## 2018-02-19 DIAGNOSIS — Z7982 Long term (current) use of aspirin: Secondary | ICD-10-CM | POA: Diagnosis not present

## 2018-02-19 DIAGNOSIS — I63442 Cerebral infarction due to embolism of left cerebellar artery: Secondary | ICD-10-CM

## 2018-02-19 DIAGNOSIS — I440 Atrioventricular block, first degree: Secondary | ICD-10-CM | POA: Diagnosis not present

## 2018-02-19 DIAGNOSIS — I110 Hypertensive heart disease with heart failure: Secondary | ICD-10-CM | POA: Diagnosis not present

## 2018-02-19 DIAGNOSIS — Z8673 Personal history of transient ischemic attack (TIA), and cerebral infarction without residual deficits: Secondary | ICD-10-CM | POA: Insufficient documentation

## 2018-02-19 DIAGNOSIS — I5022 Chronic systolic (congestive) heart failure: Secondary | ICD-10-CM | POA: Diagnosis present

## 2018-02-19 DIAGNOSIS — R918 Other nonspecific abnormal finding of lung field: Secondary | ICD-10-CM | POA: Diagnosis not present

## 2018-02-19 DIAGNOSIS — C3491 Malignant neoplasm of unspecified part of right bronchus or lung: Secondary | ICD-10-CM

## 2018-02-19 DIAGNOSIS — N179 Acute kidney failure, unspecified: Secondary | ICD-10-CM

## 2018-02-19 LAB — BASIC METABOLIC PANEL
Anion gap: 9 (ref 5–15)
BUN: 20 mg/dL (ref 8–23)
CALCIUM: 9.1 mg/dL (ref 8.9–10.3)
CO2: 26 mmol/L (ref 22–32)
CREATININE: 1.45 mg/dL — AB (ref 0.61–1.24)
Chloride: 103 mmol/L (ref 98–111)
GFR calc non Af Amer: 49 mL/min — ABNORMAL LOW (ref >60)
GFR, EST AFRICAN AMERICAN: 57 mL/min — AB (ref >60)
GLUCOSE: 88 mg/dL (ref 70–99)
Potassium: 3.4 mmol/L — ABNORMAL LOW (ref 3.5–5.1)
Sodium: 138 mmol/L (ref 135–145)

## 2018-02-19 LAB — DIGOXIN LEVEL: Digoxin Level: <0.2 ng/mL — ABNORMAL LOW (ref 0.8–2.0)

## 2018-02-19 MED ORDER — POTASSIUM CHLORIDE CRYS ER 20 MEQ PO TBCR: 40.0000 meq | EXTENDED_RELEASE_TABLET | Freq: Every day | ORAL | 3 refills | Status: DC

## 2018-02-19 NOTE — Patient Instructions (Signed)
Labs done today  Follow up with Dr. Aundra Dubin in 2 months

## 2018-02-19 NOTE — Telephone Encounter (Signed)
-----   Message from Shirley Friar, PA-C sent at 02/19/2018 11:25 AM EST ----- Please have him take an extra 40 meq (2 tablets) of potassium today, and increase his daily dose to 40 meq. (He should take a total of 60 meq today)   Legrand Como "Oda Kilts, PA-C 02/19/2018 11:25 AM

## 2018-02-19 NOTE — Telephone Encounter (Signed)
Called patient's wife [name] notified form she dropped off is signed and ready, she will come back to pick it up in about one hour.

## 2018-02-19 NOTE — Progress Notes (Signed)
Advanced Heart Failure Clinic Note   Referring Physician: PCP: [name], MD PCP-Cardiologist: Sinclair Grooms, MD  HF: Dr Aundra Dubin  HPI: Logan [name] is a 67 y.o. male  with history of HTN, A Fib, OSA, untreated prostate cancer 2012 , RLL mass 2015 declined treatment, hemoptysis for the last 6 months (he stopped xarelto), and chronic systolic heart failure.   Admitted 5/00-11/15/79 with A/C systolic HF. Required thoracentesis 7/17. Echo showed EF 15% with biventricular failure. Course complicated by Afib RVR and associated flash pulmonary edema. Required intubation and pressors. Diuresed with IV lasix. Oncology consulted for lung cancer (RLL) with liver mets. He is not a candidate for chemotherapy, but might be candidate for immunotherapy depending on genomics. MRI brain was negative for mets, but showed small left cerebellar infarct. He had hemoptysis with heparin, so he was only discharged on ASA 81 mg daily. HF meds were optimized.  He saw Dr Burr Medico recently and was started on Alectinib, which has potential cardiac toxicity, especially bradycardia.   Admitted to [facility] with increased dyspnea on 10/22. Had PEA and required intubation. LHC with no coronary diseased. LVEDP was 40 so he was diuresed with IV las and later transitioned to 40 mg po lasix. Discharge weight 221 pound. He was discharged to home  01/06/2018 .   He presents today for regular follow up. Last visit, switched to [facility] Feeling good overall. They have been limiting his fluid intake and salt and this has helped greatly at keeping his fluid down. Took an extra 40 mg lasix on Thanksgiving with swelling with resolution. Denies PND or orthopnea. No lightheadedness or dizziness. Appetite is OK. He is eating several small meals throughout the day. Denies fever or chills. Taking all medications as directed.     LHC at [facility] 01/02/2018 Coronaries ok.   ECHO 1022/2019 EF 25-30% ECHO 09/2017 EF 15%.    Review  of systems complete and found to be negative unless listed in HPI.    Past Medical History:  Diagnosis Date  . CHF (congestive heart failure) ([place])   . Chronic systolic heart failure ([place]) 04/15/2015    EF 30-35% by echo 2016 , [facility]   . Hypertension   . Lung mass 04/15/2015    Never biopsied. History of PET scan that raised concern.   . Metabolic syndrome 10/14/9935  . OSA on CPAP   . Paroxysmal atrial fibrillation ([place]) 04/15/2015   On apixaban   . Prostate cancer ([place])   . Thrombocytopenia ([place]) 04/15/2015   Current Outpatient Medications  Medication Sig Dispense Refill  . ALECENSA 150 MG capsule TAKE 4 CAPSULES (600 MG TOTAL) BY MOUTH 2 TIMES DAILY WITH A MEAL 240 capsule 0  . amiodarone (PACERONE) 100 MG tablet Take 1 tablet (100 mg total) by mouth daily. 30 tablet 6  . apixaban (ELIQUIS) 5 MG TABS tablet Take 1 tablet (5 mg total) by mouth 2 (two) times daily. 60 tablet 6  . Cholecalciferol ([employer] VIT D 5000 HIGH-POTENCY) 5000 units capsule Take 5,000 Units by mouth daily.    . digoxin (LANOXIN) 0.125 MG tablet Take 1 tablet (0.125 mg total) by mouth daily. 90 tablet 3  . furosemide (LASIX) 80 MG tablet Take 80 mg by mouth. 0.5 tablet in the morning 0.5 tablet at night    . Multiple Vitamin (MULTIVITAMIN) tablet Take 1 tablet by mouth daily.    . potassium chloride SA (KLOR-CON M20) 20 MEQ tablet Take 1 tablet (20 mEq total) by mouth  daily. 90 tablet 3  . sacubitril-valsartan (ENTRESTO) 24-26 MG Take 1 tablet by mouth 2 (two) times daily. 60 tablet 6  . traZODone (DESYREL) 50 MG tablet Take 1 tablet (50 mg total) by mouth at bedtime as needed for sleep. 20 tablet 0   No current facility-administered medications for this encounter.    No Known Allergies  Social History   Socioeconomic History  . Marital status: Married    Spouse name: Not on file  . Number of children: Not on file  . Years of education: Not on file  . Highest education level: Not on file  Occupational History    . Not on file  Social Needs  . Financial resource strain: Not on file  . Food insecurity:    Worry: Not on file    Inability: Not on file  . Transportation needs:    Medical: Not on file    Non-medical: Not on file  Tobacco Use  . Smoking status: Former Smoker    Years: 6.50    Types: Cigarettes, Cigars, Pipe    Last attempt to quit: 03/14/1974    Years since quitting: 43.9  . Smokeless tobacco: Never Used  Substance and Sexual Activity  . Alcohol use: Yes    Alcohol/week: 0.0 standard drinks    Comment: 09/26/2017 "holidays only"  . Drug use: Never  . Sexual activity: Yes  Lifestyle  . Physical activity:    Days per week: Not on file    Minutes per session: Not on file  . Stress: Not on file  Relationships  . Social connections:    Talks on phone: Not on file    Gets together: Not on file    Attends religious service: Not on file    Active member of club or organization: Not on file    Attends meetings of clubs or organizations: Not on file    Relationship status: Not on file  . Intimate partner violence:    Fear of current or ex partner: Not on file    Emotionally abused: Not on file    Physically abused: Not on file    Forced sexual activity: Not on file  Other Topics Concern  . Not on file  Social History Narrative  . Not on file   Family History  Problem Relation Age of Onset  . Parkinson's disease Mother   . Hypertension Father    Vitals:   02/19/18 0909  BP: 100/68  Pulse: 70  SpO2: 97%  Weight: 104.2 kg (229 lb 12.8 oz)   Wt Readings from Last 3 Encounters:  02/19/18 104.2 kg (229 lb 12.8 oz)  01/24/18 102.4 kg (225 lb 12.8 oz)  01/22/18 104.1 kg (229 lb 9.6 oz)   PHYSICAL EXAM: General: Well appearing. No resp difficulty. HEENT: Normal Neck: Supple. JVP 5-6. Carotids 2+ bilat; no bruits. No thyromegaly or nodule noted. Cor: PMI nondisplaced. RRR, No M/G/R noted Lungs: CTAB, normal effort. Abdomen: Soft, non-tender, non-distended, no HSM. No  bruits or masses. +BS  Extremities: No cyanosis, clubbing, or rash. R and LLE no edema.  Neuro: Alert & orientedx3, cranial nerves grossly intact. moves all 4 extremities w/o difficulty. Affect pleasant   ASSESSMENT & PLAN:  1. Chronic systolic CHF: Long history of nonischemic cardiomyopathy. No ICD. He was admitted with afib/RVR and dyspnea. Failed TEE-guided DCCV and intubated likely due to pulmonary edema. Recent hospitalization 12/2017 with increased dyspnea and PEA arrest. TEE 7/19 with EF 15%. Diuresed with IV lasix. NYHA  III-IIIB symptoms chronically. Volume status stable on exam.  - Continue lasix 80 mg q am and 40 mg q pm. Can take extra 40 mg lasix for 3 pound weight gain.  - Continue Entresto 24/26 mg BID.  - Continue digoxin 0.125 mg daily. Check level today.  -Long-term prognosis with combination of metastatic lung cancer and severe CHF(low output) is very poor. He is not a candidate for advanced cardiac therapies. Would not recommend ICD - He is a marginal candidate for cardiomems with prognosis as above. Previously discussed cardiomems and he would like to pursue if able. Application for cardiomems started.  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 Seen by oncology, not candidate for chemotherapy but may be candidate for immunotherapy depending on genomics.  - MR brain negative for mets.  - Follows with Dr Burr Medico. Tumor is getting smaller. Continue current regimen.  - Has had transient hemoptysis, but no further.  3. Paroxysmal Atrial fibrillation:  - Initially with RVR, likely was driving CHF.  - Regular on exam. EKG 01/22/18 with NSR, 1st degree heart block.  - Continue amiodarone 100 mg daily.  - Continue Eliquis 5 mg BID. Denies bleeding. Hemoglobin stable 8/28 at 11.0 4. Recent AKI: - BMET today.  5. H/o elevated PSA:  - Has declined workup in past. No change.  6. Recent acute hypoxemic respiratory failure:  - Suspect due to pulmonary edema+  large RLL mass/hemoptysis.  - Resolved.  7. 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  Likely due to atrial fibrillation.  He is off anticoagulation due to severe hemoptysis from lung mass.  - Continue Eliquis 5 mg BID. Denies bleeding.   Stable overall. BMET and digoxin level today.   Shirley Friar, PA-C 02/19/18  Greater than 50% of the 25 minute visit was spent in counseling/coordination of care regarding disease state education, salt/fluid restriction, sliding scale diuretics, and medication compliance.

## 2018-02-19 NOTE — Telephone Encounter (Signed)
Notes recorded by Kerry Dory, CMA on 02/19/2018 at 3:54 PM EST Patient aware. Patient voiced understanding   ------  Notes recorded by Shirley Friar, PA-C on 02/19/2018 at 11:25 AM EST Please have him take an extra 40 meq (2 tablets) of potassium today, and increase his daily dose to 40 meq. (He should take a total of 60 meq today)   Legrand Como "Oda Kilts, PA-C 02/19/2018 11:25 AM

## 2018-02-23 ENCOUNTER — Other Ambulatory Visit (HOSPITAL_COMMUNITY): Payer: Self-pay

## 2018-02-23 ENCOUNTER — Other Ambulatory Visit (HOSPITAL_COMMUNITY): Payer: Self-pay | Admitting: Cardiology

## 2018-02-23 ENCOUNTER — Other Ambulatory Visit: Payer: Self-pay | Admitting: *Deleted

## 2018-02-23 MED ORDER — FUROSEMIDE 80 MG PO TABS: ORAL_TABLET | ORAL | 5 refills | Status: DC

## 2018-02-23 MED ORDER — APIXABAN 5 MG PO TABS: 5.0000 mg | ORAL_TABLET | Freq: Two times a day (BID) | ORAL | 5 refills | Status: DC

## 2018-02-23 MED ORDER — SACUBITRIL-VALSARTAN 24-26 MG PO TABS: 1.0000 | ORAL_TABLET | Freq: Two times a day (BID) | ORAL | 3 refills | Status: DC

## 2018-02-23 NOTE — Telephone Encounter (Signed)
Originally ordered by CHF Clinic so they should be the ones to fill.  Thanks!

## 2018-02-23 NOTE — Telephone Encounter (Signed)
Okay to refill? Last ordered by CHF clinic. Please advise. Thanks, MI

## 2018-02-24 ENCOUNTER — Other Ambulatory Visit: Payer: Self-pay | Admitting: Hematology

## 2018-02-24 DIAGNOSIS — C3491 Malignant neoplasm of unspecified part of right bronchus or lung: Secondary | ICD-10-CM

## 2018-02-28 ENCOUNTER — Other Ambulatory Visit (HOSPITAL_COMMUNITY): Payer: Self-pay | Admitting: *Deleted

## 2018-03-19 ENCOUNTER — Telehealth (HOSPITAL_COMMUNITY): Payer: Self-pay

## 2018-03-19 NOTE — Telephone Encounter (Signed)
Called pt wife regarding her call/ questions. Left message for pt to call back in order to have her questions answered.

## 2018-03-20 ENCOUNTER — Telehealth (HOSPITAL_COMMUNITY): Payer: Self-pay

## 2018-03-20 NOTE — Telephone Encounter (Signed)
Pt wife contacted about further paper work needed for [employer]. Spoke with wife about what was needed and how she would prefer to receive information. Printed information and sent via mail.

## 2018-04-11 ENCOUNTER — Ambulatory Visit: Payer: 59 | Admitting: Hematology

## 2018-04-11 ENCOUNTER — Other Ambulatory Visit: Payer: Self-pay | Admitting: Hematology

## 2018-04-11 ENCOUNTER — Other Ambulatory Visit: Payer: 59

## 2018-04-11 DIAGNOSIS — C3491 Malignant neoplasm of unspecified part of right bronchus or lung: Secondary | ICD-10-CM

## 2018-04-11 MED ORDER — ALECTINIB HCL 150 MG PO CAPS: ORAL_CAPSULE | ORAL | 2 refills | Status: DC

## 2018-04-16 ENCOUNTER — Telehealth: Payer: Self-pay

## 2018-04-16 NOTE — Telephone Encounter (Signed)
Patient's wife [name] called to reschedule his appointments for lab and f/u with Dr. Burr Medico, sent schedule message to reschedule this patient in next couple of weeks and to call his wife at [phone]

## 2018-04-17 ENCOUNTER — Telehealth: Payer: Self-pay | Admitting: Hematology

## 2018-04-17 NOTE — Telephone Encounter (Signed)
Scheduled appt per 2/3 sch message - pt wife is aware of appt date and time

## 2018-04-18 ENCOUNTER — Telehealth: Payer: Self-pay

## 2018-04-18 NOTE — Telephone Encounter (Signed)
Faxed clarification on [employer] to [place], sent to HIM for scanning to chart.

## 2018-04-30 ENCOUNTER — Other Ambulatory Visit: Payer: Self-pay | Admitting: Hematology

## 2018-04-30 DIAGNOSIS — C3491 Malignant neoplasm of unspecified part of right bronchus or lung: Secondary | ICD-10-CM

## 2018-05-15 ENCOUNTER — Ambulatory Visit: Payer: 59 | Admitting: Hematology

## 2018-05-15 ENCOUNTER — Inpatient Hospital Stay: Payer: 59 | Attending: Hematology

## 2018-05-15 ENCOUNTER — Other Ambulatory Visit: Payer: 59

## 2018-05-15 DIAGNOSIS — N183 Chronic kidney disease, stage 3 (moderate): Secondary | ICD-10-CM | POA: Insufficient documentation

## 2018-05-15 DIAGNOSIS — I480 Paroxysmal atrial fibrillation: Secondary | ICD-10-CM | POA: Diagnosis not present

## 2018-05-15 DIAGNOSIS — J90 Pleural effusion, not elsewhere classified: Secondary | ICD-10-CM | POA: Insufficient documentation

## 2018-05-15 DIAGNOSIS — C787 Secondary malignant neoplasm of liver and intrahepatic bile duct: Secondary | ICD-10-CM | POA: Insufficient documentation

## 2018-05-15 DIAGNOSIS — C61 Malignant neoplasm of prostate: Secondary | ICD-10-CM | POA: Insufficient documentation

## 2018-05-15 DIAGNOSIS — C3431 Malignant neoplasm of lower lobe, right bronchus or lung: Secondary | ICD-10-CM | POA: Insufficient documentation

## 2018-05-15 DIAGNOSIS — I130 Hypertensive heart and chronic kidney disease with heart failure and stage 1 through stage 4 chronic kidney disease, or unspecified chronic kidney disease: Secondary | ICD-10-CM | POA: Insufficient documentation

## 2018-05-15 DIAGNOSIS — C3491 Malignant neoplasm of unspecified part of right bronchus or lung: Secondary | ICD-10-CM

## 2018-05-15 LAB — CBC WITH DIFFERENTIAL (CANCER CENTER ONLY)
Abs Immature Granulocytes: 0.02 10*3/uL (ref 0.00–0.07)
Basophils Absolute: 0.1 10*3/uL (ref 0.0–0.1)
Basophils Relative: 1 %
EOS ABS: 0.1 10*3/uL (ref 0.0–0.5)
EOS PCT: 1 %
HCT: 33.2 % — ABNORMAL LOW (ref 39.0–52.0)
Hemoglobin: 11.2 g/dL — ABNORMAL LOW (ref 13.0–17.0)
IMMATURE GRANULOCYTES: 0 %
LYMPHS ABS: 2.2 10*3/uL (ref 0.7–4.0)
Lymphocytes Relative: 44 %
MCH: 30.1 pg (ref 26.0–34.0)
MCHC: 33.7 g/dL (ref 30.0–36.0)
MCV: 89.2 fL (ref 80.0–100.0)
MONO ABS: 0.3 10*3/uL (ref 0.1–1.0)
Monocytes Relative: 7 %
Neutro Abs: 2.3 10*3/uL (ref 1.7–7.7)
Neutrophils Relative %: 47 %
Platelet Count: 139 10*3/uL — ABNORMAL LOW (ref 150–400)
RBC: 3.72 MIL/uL — ABNORMAL LOW (ref 4.22–5.81)
RDW: 14.3 % (ref 11.5–15.5)
WBC Count: 5 10*3/uL (ref 4.0–10.5)
nRBC: 0 % (ref 0.0–0.2)

## 2018-05-15 LAB — CMP (CANCER CENTER ONLY)
ALT: 20 U/L (ref 0–44)
AST: 21 U/L (ref 15–41)
Albumin: 4.3 g/dL (ref 3.5–5.0)
Alkaline Phosphatase: 71 U/L (ref 38–126)
Anion gap: 9 (ref 5–15)
BILIRUBIN TOTAL: 0.7 mg/dL (ref 0.3–1.2)
BUN: 26 mg/dL — AB (ref 8–23)
CO2: 27 mmol/L (ref 22–32)
Calcium: 9.1 mg/dL (ref 8.9–10.3)
Chloride: 106 mmol/L (ref 98–111)
Creatinine: 1.71 mg/dL — ABNORMAL HIGH (ref 0.61–1.24)
GFR, EST NON AFRICAN AMERICAN: 40 mL/min — AB (ref >60)
GFR, Est AFR Am: 47 mL/min — ABNORMAL LOW (ref >60)
Glucose, Bld: 110 mg/dL — ABNORMAL HIGH (ref 70–99)
POTASSIUM: 4.5 mmol/L (ref 3.5–5.1)
Sodium: 142 mmol/L (ref 135–145)
Total Protein: 7.6 g/dL (ref 6.5–8.1)

## 2018-05-15 NOTE — Progress Notes (Signed)
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 Cardiomegaly with diffuse increased interstitial markings consistent with pulmonary edema. 2. 5-6 cm rounded density seen in the right lower lobe medially. This could be related to an area of consolidation correlate clinically for pneumonia.. But the rounded and well-defined borders are suspicious for mass. Followup x-ray suggested after  treating for the pulmonary edema/pneumonia to see if this resolves.    04/30/2014 Imaging    04/30/2014 CXR IMPRESSION: Enlarging right lower lobe nodule. Would recommend PET CT to further evaluate.    01/20/2015 Imaging    01/20/2015 CXR IMPRESSION: Significant improvement comparatively, now with smaller pleural effusions and resolving pulmonary edema. Known right lower lobe mass suspicious for bronchogenic carcinoma is redemonstrated.    04/07/2015 PET scan    04/07/2015 PET Scan IMPRESSION:  Hypermetabolic right lower lobe mass Hypermetabolic left adrenal nodule.    08/21/2015 Imaging    08/21/2015 CXR IMPRESSION: Right lower lobe mass.    09/22/2017 Imaging    09/22/2017 CXR IMPRESSION: 1.Moderate right pleural effusion with right lower/middle lobe consolidation. Minimal patchy opacities in left lower lobe. 2.Cardiomegaly.    09/26/2017 Imaging   09/26/2017 CT Chest WO Contrast IMPRESSION: 1. Large RIGHT lower lobe mass with central and peripheral calcifications occupies the near entirety of the RIGHT lower lobe and concerning for neoplasm. 2. Moderate RIGHT effusion. 3. Mild mediastinal and RIGHT hilar adenopathy. 4. Recommend FDG PET scan for biopsy planning/staging    09/26/2017 Tumor Marker     Ref. Range 09/26/2017 21:31  Prostatic Specific Antigen Latest Ref Range: 0.00 - 4.00 ng/mL 84.83 (H)       09/27/2017 Imaging    09/27/2017 CT AP W WO Contrast IMPRESSION: 1. Large complex masses in the right lower lobe of the lung and in the right hepatic lobe. Smaller liver lesions in the left hepatic lobe. Nonspecific lesions in the spleen and kidneys. No significant pathologic adenopathy. This would not be a characteristic metastatic pattern for prostate cancer and is not a specific metastatic and imaging pattern to suggest an individual etiology. Possibilities might include metastatic lung cancer, melanoma, metastatic hepatocellular carcinoma, or a variety of other possibilities. Tissue diagnosis recommended. 2. Moderate prostatomegaly. 3. Scattered mild ascites. 4. Moderate cardiomegaly.  Small inferior pericardial effusion. 5. Trace bilateral pleural effusions.    10/02/2017 Pathology Results    10/02/2017 Surgical Pathology Diagnosis Liver, needle/core biopsy, Left Hepatic Lobe - METASTATIC LUNG ADENOCARCINOMA TO THE LIVER. SEE NOTE    10/02/2017 Cancer Staging    Staging form: Lung, AJCC 8th Edition - Clinical stage from 10/02/2017: Stage IV (cT3, cN2, cM1c) - Signed by Truitt Merle, MD on 10/20/2017    10/04/2017 Initial Diagnosis    Non-small cell lung cancer (NSCLC) ([place])    10/21/2017 -  Antibody Plan    Alectinib '600mg'$  bid started on 10/21/2017  01/10/2018 PET scan    01/10/2018 PET Scan IMPRESSION: 1. Marked reduction in size of the right lower lobe mass with fairly low metabolic activity in the residual mass,  maximum SUV 5.3. Notably reduced thoracic adenopathy which is not currently hypermetabolic. 2. Marked reduction in size and conspicuity of the hepatic tumors, both of which have metabolic activity similar to that of the background liver parenchyma. 3. Resolution of the splenic mass. No additional or new lesions are identified. 4. Accentuated activity anteriorly in the left seventh rib, low-grade with maximum SUV 3.5, without appreciable CT abnormality on the CT data, significance uncertain. 5. Accentuated activity at the right sternoclavicular joint, SUV 7.2, probably from arthropathy. 6. Accentuated activity in the posterior and apical portions of the prostate gland, significance uncertain. 7. Other imaging findings of potential clinical significance: Chronic left maxillary sinusitis.     CURRENT THERAPY  Alectinib '600mg'$  BID started on 10/21/2017  INTERVAL HISTORY: Logan [name] is a 68 y.o. male who is here for follow-up. Today, he is here with his wife. From a heart stand point he is doing well. He noticed that he was hospitalized after eating large quantities of food. He has changed his eating habits and is doing much better. He denies cough, pain, bleeding, and sob. He monitors his BP daily at home. His energy has improved.   Pertinent positives and negatives of review of systems are listed and detailed within the above HPI.  REVIEW OF SYSTEMS:   Constitutional: Denies fevers, chills or abnormal weight loss Eyes: Denies blurriness of vision Ears, nose, mouth, throat, and face: Denies mucositis or sore throat Respiratory: Denies cough, dyspnea or wheezes Cardiovascular: Denies palpitation, chest discomfort or lower extremity swelling Gastrointestinal:  Denies nausea, heartburn or change in bowel habits Skin: Denies abnormal skin rashes Lymphatics: Denies new lymphadenopathy or easy bruising Neurological:Denies numbness, tingling or new weaknesses Behavioral/Psych: Mood  is stable, no new changes  All other systems were reviewed with the patient and are negative.  MEDICAL HISTORY:  Past Medical History:  Diagnosis Date  . CHF (congestive heart failure) ([place])   . Chronic systolic heart failure ([place]) 04/15/2015    EF 30-35% by echo 2016 , [facility]   . Hypertension   . Lung mass 04/15/2015    Never biopsied. History of PET scan that raised concern.   . Metabolic syndrome 04/20/621  . OSA on CPAP   . Paroxysmal atrial fibrillation ([place]) 04/15/2015   On apixaban   . Prostate cancer ([place])   . Thrombocytopenia ([place]) 04/15/2015    SURGICAL HISTORY: Past Surgical History:  Procedure Laterality Date  . CARDIAC CATHETERIZATION N/A 04/27/2015   Procedure: Right/Left Heart Cath and Coronary Angiography;  Surgeon: [name], MD;  Location: [facility];  Service: Cardiovascular;  Laterality: N/A;  . CARDIOVERSION N/A 09/29/2017   Procedure: CARDIOVERSION;  Surgeon: Pixie Casino, MD;  Location: [facility];  Service: Cardiovascular;  Laterality: N/A;  . IR THORACENTESIS ASP PLEURAL SPACE W/IMG GUIDE  09/27/2017  . PROSTATE BIOPSY    . TEE WITHOUT CARDIOVERSION N/A 09/29/2017   Procedure: TRANSESOPHAGEAL ECHOCARDIOGRAM (TEE);  Surgeon: Pixie Casino, MD;  Location[facility];  Service: Cardiovascular;  Laterality: N/A;    I have reviewed the social history and family history with the patient and they are unchanged from previous note.  ALLERGIES:  has No Known Allergies.  MEDICATIONS:  Current Outpatient Medications  Medication Sig Dispense Refill  . ALECENSA 150 MG capsule TAKE 4 CAPSULES  BY MOUTH TWICE A DAY WITH A MEAL 240 capsule 2  . amiodarone (PACERONE) 100 MG tablet Take 1 tablet (100 mg total) by mouth daily. 30 tablet 6  . apixaban (ELIQUIS) 5 MG TABS tablet Take 1 tablet (5 mg total) by mouth 2 (two) times daily. 60 tablet 5  . Cholecalciferol ([employer]S VIT D 5000 HIGH-POTENCY) 5000 units capsule Take 5,000 Units by mouth daily.    . digoxin  (LANOXIN) 0.125 MG tablet Take 1 tablet (0.125 mg total) by mouth daily. 90 tablet 3  . furosemide (LASIX) 80 MG tablet Take 1 tab (80 mg) in the morning and 0.5 tab (40 mg) tablet at night 45 tablet 5  . Multiple Vitamin (MULTIVITAMIN) tablet Take 1 tablet by mouth daily.    . potassium chloride SA (KLOR-CON M20) 20 MEQ tablet Take 2 tablets (40 mEq total) by mouth daily. 180 tablet 3  . sacubitril-valsartan (ENTRESTO) 24-26 MG Take 1 tablet by mouth 2 (two) times daily. 180 tablet 3  . traZODone (DESYREL) 50 MG tablet Take 1 tablet (50 mg total) by mouth at bedtime as needed for sleep. 20 tablet 0   No current facility-administered medications for this visit.     PHYSICAL EXAMINATION:  ECOG PERFORMANCE STATUS: 2 - Symptomatic, <50% confined to bed  Vitals:   05/22/18 1416  BP: 99/67  Pulse: 66  Resp: 17  Temp: 99 F (37.2 C)  SpO2: 98%   Filed Weights   05/22/18 1416  Weight: 226 lb (102.5 kg)    GENERAL:alert, no distress and comfortable SKIN: skin color, texture, turgor are normal, no rashes or significant lesions EYES: normal, Conjunctiva are pink and non-injected, sclera clear OROPHARYNX:no exudate, no erythema and lips, buccal mucosa, and tongue normal  NECK: supple, thyroid normal size, non-tender, without nodularity LYMPH:  no palpable lymphadenopathy in the cervical, axillary or inguinal LUNGS: clear to auscultation and percussion with normal breathing effort HEART: regular rate & rhythm and no murmurs and no lower extremity edema ABDOMEN:abdomen soft, non-tender and normal bowel sounds, no hepatomegaly  Musculoskeletal:no cyanosis of digits and no clubbing  NEURO: alert & oriented x 3 with fluent speech, no focal motor/sensory deficits  LABORATORY DATA:  I have reviewed the data as listed CBC Latest Ref Rng & Units 05/15/2018 01/15/2018 12/07/2017  WBC 4.0 - 10.5 K/uL 5.0 6.3 5.7  Hemoglobin 13.0 - 17.0 g/dL 11.2(L) 10.2(L) 10.1(L)  Hematocrit 39.0 - 52.0 % 33.2(L)  31.1(L) 29.9(L)  Platelets 150 - 400 K/uL 139(L) 315 141     CMP Latest Ref Rng & Units 05/15/2018 02/19/2018 01/15/2018  Glucose 70 - 99 mg/dL 110(H) 88 98  BUN 8 - 23 mg/dL 26(H) 20 19  Creatinine 0.61 - 1.24 mg/dL 1.71(H) 1.45(H) 1.58(H)  Sodium 135 - 145 mmol/L 142 138 140  Potassium 3.5 - 5.1 mmol/L 4.5 3.4(L) 4.0  Chloride 98 - 111 mmol/L 106 103 100  CO2 22 - 32 mmol/L '27 26 30  '$ Calcium 8.9 - 10.3 mg/dL 9.1 9.1 9.6  Total Protein 6.5 - 8.1 g/dL 7.6 - 7.7  Total Bilirubin 0.3 - 1.2 mg/dL 0.7 - 0.3  Alkaline Phos 38 - 126 U/L 71 - 84  AST 15 - 41 U/L 21 - 20  ALT 0 - 44 U/L 20 - 17      RADIOGRAPHIC STUDIES: I have personally reviewed the radiological images as listed and agreed with the findings in the report. No results found.   ASSESSMENT & PLAN:  Logan [name]  is a 68 y.o. male with history of   1. 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 - Due to his advanced heart failure, he is not a good candidate for intensive chemotherapy. -tumor NGS showed ALK translocation (+) - He started Alectinib on  10/21/2017, tolerating very well  -His first restaging scan in October 2019 showed excellent response to treatment. -Labs reviewed, CBC showed RBC 3.72, Hg 11.2, HCT 33.2 and platelet count 139. CMP showed glucose, Bld 110, BUN 26, Creatinine 1.71, overall stable  - I ordered a CT CAP without contrast for restaging in 2-3 weeks  - F/u in 2 months    2.History of untreated prostate cancer, with rising PSA -will f/u PSA   3.Bilateral pleural effusion,R>L, likely related to CHF -s/p thoracentesis 7/17 with cytology showing reactive mesothelial cells - stable on f/u CXR. -he is on lasix   4.Cardiomyopathy with EF 10 to 15%, history of cardiac arrest  -TTE/TEE previously showed  decreased ejection fraction to 10 to 15% -Previously 25 to 30% -Cardiology on board, f/u with Dr. Aundra Dubin and Dr. Tamala Julian  -Currently on digoxin, Lasix, spironolactone and Cozaar - He is not a candidate for advanced cardiac therapies due to his stage IV lung cancer  - F/u with cardiologist   5. Atrial Fibrillation -Was on Xarelto but off due to hemoptysis -Continue aspirin 81 and follow-up with cardiology to decide on antiplatelet versus anticoagulation -On Eliquis 5 mg    6.Incidentalstroke:leftcerebellar smallinfarct -Resultantno neurological focal deficit -MRIpunctate to small left cerebellar infarct -MRAunremarkable -CarotidDopplerunremarkable -Xarelto (rivaroxaban) dailyprior to admission (off due to hemoptysis), now onaspirin 81 mg daily.Ideally, patient should be on anticoagulation however due to severe hemoptysis, hematuria and anemia, patient not anticoagulated candidate at this time. Agree with cardiology plan to continue aspirin 81 and then follow-up as outpatient. -Patient counseled to be compliant withhisantithrombotic medications -Ongoing aggressive stroke risk factor management  7. Goal of care discussion  -We again discussed the incurable nature of his cancer, and the overall poor prognosis, especially if he does not have good response to treatment or progressed on treatment  -The patient understands the goal of care is palliative. -He is full code now   8. CKD stage III -stable, continue monitoring   Plan: -Continue alectinib  - I ordered a CT CAP without contrast  to be done in 2 weeks, will call him with results   -f/u in 2 months      No problem-specific Assessment & Plan notes found for this encounter.   Orders Placed This Encounter  Procedures  . CT Abdomen Pelvis Wo Contrast    Standing Status:   Future    Standing Expiration Date:   05/22/2019    Order Specific Question:   Preferred imaging location?    Answer:   [facility]    Order Specific Question:   Is Oral Contrast requested for this exam?    Answer:   Yes, Per Radiology protocol    Order Specific Question:   Radiology Contrast Protocol - do NOT remove file path    Answer:   \\[email or web address]  . CT Chest Wo Contrast    Standing Status:   Future    Standing Expiration Date:   05/22/2019    Order Specific Question:   Preferred imaging location?  Answer:   [facility]    Order Specific Question:   Radiology Contrast Protocol - do NOT remove file path    Answer:   \\[email or web address]   All questions were answered. The patient knows to call the clinic with any problems, questions or concerns. No barriers to learning was detected. I spent 20 minutes counseling the patient face to face. The total time spent in the appointment was 25 minutes and more than 50% was on counseling and review of test results  I, Manson Allan am acting as scribe for Dr. Truitt Merle.  I have reviewed the above documentation for accuracy and completeness, and I agree with the above.     Truitt Merle, MD 05/22/2018

## 2018-05-22 ENCOUNTER — Encounter: Payer: Self-pay | Admitting: Hematology

## 2018-05-22 ENCOUNTER — Inpatient Hospital Stay (HOSPITAL_BASED_OUTPATIENT_CLINIC_OR_DEPARTMENT_OTHER): Payer: 59 | Admitting: Hematology

## 2018-05-22 VITALS — BP 99/67 | HR 66 | Temp 99.0°F | Resp 17 | Ht 71.0 in | Wt 226.0 lb

## 2018-05-22 DIAGNOSIS — J90 Pleural effusion, not elsewhere classified: Secondary | ICD-10-CM

## 2018-05-22 DIAGNOSIS — C61 Malignant neoplasm of prostate: Secondary | ICD-10-CM | POA: Diagnosis not present

## 2018-05-22 DIAGNOSIS — I130 Hypertensive heart and chronic kidney disease with heart failure and stage 1 through stage 4 chronic kidney disease, or unspecified chronic kidney disease: Secondary | ICD-10-CM

## 2018-05-22 DIAGNOSIS — C787 Secondary malignant neoplasm of liver and intrahepatic bile duct: Secondary | ICD-10-CM | POA: Diagnosis not present

## 2018-05-22 DIAGNOSIS — C3431 Malignant neoplasm of lower lobe, right bronchus or lung: Secondary | ICD-10-CM

## 2018-05-22 DIAGNOSIS — I480 Paroxysmal atrial fibrillation: Secondary | ICD-10-CM

## 2018-05-22 DIAGNOSIS — N183 Chronic kidney disease, stage 3 (moderate): Secondary | ICD-10-CM

## 2018-05-22 DIAGNOSIS — C3491 Malignant neoplasm of unspecified part of right bronchus or lung: Secondary | ICD-10-CM

## 2018-05-23 ENCOUNTER — Encounter (HOSPITAL_COMMUNITY): Payer: Self-pay | Admitting: Cardiology

## 2018-05-23 ENCOUNTER — Ambulatory Visit (HOSPITAL_COMMUNITY)
Admission: RE | Admit: 2018-05-23 | Discharge: 2018-05-23 | Disposition: A | Discharge status: Home / Self Care | Payer: 59 | Source: Ambulatory Visit | Attending: Cardiology | Admitting: Cardiology

## 2018-05-23 ENCOUNTER — Other Ambulatory Visit: Payer: Self-pay

## 2018-05-23 ENCOUNTER — Telehealth: Payer: Self-pay | Admitting: Hematology

## 2018-05-23 VITALS — BP 104/70 | HR 58 | Wt 226.8 lb

## 2018-05-23 DIAGNOSIS — I5082 Biventricular heart failure: Secondary | ICD-10-CM | POA: Insufficient documentation

## 2018-05-23 DIAGNOSIS — I469 Cardiac arrest, cause unspecified: Secondary | ICD-10-CM | POA: Insufficient documentation

## 2018-05-23 DIAGNOSIS — Z7982 Long term (current) use of aspirin: Secondary | ICD-10-CM | POA: Diagnosis not present

## 2018-05-23 DIAGNOSIS — I428 Other cardiomyopathies: Secondary | ICD-10-CM | POA: Diagnosis not present

## 2018-05-23 DIAGNOSIS — I5022 Chronic systolic (congestive) heart failure: Secondary | ICD-10-CM | POA: Diagnosis present

## 2018-05-23 DIAGNOSIS — Z79899 Other long term (current) drug therapy: Secondary | ICD-10-CM | POA: Insufficient documentation

## 2018-05-23 DIAGNOSIS — Z87891 Personal history of nicotine dependence: Secondary | ICD-10-CM | POA: Insufficient documentation

## 2018-05-23 DIAGNOSIS — Z7901 Long term (current) use of anticoagulants: Secondary | ICD-10-CM | POA: Insufficient documentation

## 2018-05-23 DIAGNOSIS — I480 Paroxysmal atrial fibrillation: Secondary | ICD-10-CM | POA: Diagnosis not present

## 2018-05-23 DIAGNOSIS — Z8249 Family history of ischemic heart disease and other diseases of the circulatory system: Secondary | ICD-10-CM | POA: Insufficient documentation

## 2018-05-23 DIAGNOSIS — G4733 Obstructive sleep apnea (adult) (pediatric): Secondary | ICD-10-CM | POA: Insufficient documentation

## 2018-05-23 DIAGNOSIS — N183 Chronic kidney disease, stage 3 (moderate): Secondary | ICD-10-CM | POA: Insufficient documentation

## 2018-05-23 DIAGNOSIS — Z8673 Personal history of transient ischemic attack (TIA), and cerebral infarction without residual deficits: Secondary | ICD-10-CM | POA: Insufficient documentation

## 2018-05-23 DIAGNOSIS — I130 Hypertensive heart and chronic kidney disease with heart failure and stage 1 through stage 4 chronic kidney disease, or unspecified chronic kidney disease: Secondary | ICD-10-CM | POA: Diagnosis not present

## 2018-05-23 MED ORDER — FUROSEMIDE 80 MG PO TABS: ORAL_TABLET | ORAL | 3 refills | Status: DC

## 2018-05-23 MED ORDER — POTASSIUM CHLORIDE CRYS ER 20 MEQ PO TBCR: 20.0000 meq | EXTENDED_RELEASE_TABLET | Freq: Every day | ORAL | 3 refills | Status: DC

## 2018-05-23 MED ORDER — CARVEDILOL 3.125 MG PO TABS: 3.1250 mg | ORAL_TABLET | Freq: Two times a day (BID) | ORAL | 3 refills | Status: DC

## 2018-05-23 MED ORDER — SPIRONOLACTONE 25 MG PO TABS: 12.5000 mg | ORAL_TABLET | Freq: Every day | ORAL | 3 refills | Status: DC

## 2018-05-23 NOTE — Telephone Encounter (Signed)
No los per 3/10.

## 2018-05-23 NOTE — Patient Instructions (Signed)
Coreg 3.125 twice a day has been added to your medication list  Lasix 80mg  daily has been added to your medication list  BEGIN taking Spironolactone 12.5 (0.5 tab) daily  DECREASE Potassium to 20mg  (1 tab) daily  These medications have been sent to your pharmacy  Please follow up with lab work in 2 weeks.   EKG was completed today  You have been referred to follow up with pharmacy for a medication titration consult in 2 weeks  Your physician recommends that you schedule a follow-up appointment in: 2 MONTHS

## 2018-05-23 NOTE — Progress Notes (Signed)
Advanced Heart Failure Clinic Note   Referring Physician: PCP: [name], MD PCP-Cardiologist: Sinclair Grooms, MD  HF: Dr Aundra Dubin  HPI: Logan [name] is a 68 y.o. male  with history of HTN, paroxysmal atrial fibrillation, OSA, untreated prostate cancer 2012 , RLL mass 2015 declined treatment, and chronic systolic heart failure.   Admitted 1/69-08/19/87 with A/C systolic HF. Required thoracentesis 7/17. Echo showed EF 15% with biventricular failure. Course complicated by Afib RVR and associated flash pulmonary edema. Required intubation and pressors. Diuresed with IV lasix. Oncology consulted for lung cancer (RLL) with liver mets. MRI brain was negative for mets, but showed small left cerebellar infarct. He had hemoptysis with heparin, so he was only discharged on ASA 81 mg daily. HF meds were optimized. He was eventually started on apixaban.   He saw Dr Burr Medico and was started on Alectinib, which has potential cardiac toxicity, especially bradycardia.   Admitted to [facility] with increased dyspnea on 01/02/18. Had PEA arrest and required intubation. LHC with no coronary disease. LVEDP was 40 so he was diuresed with IV lasix and later transitioned to 40 mg po lasix. Discharge weight 221 pound. He was discharged to home  01/06/2018 .   He presents today for followup of CHF.  He has had a good response to alectinib with lung mass decreasing in size.  He is feeling well overall. No dyspnea walking on flat ground or up a flight of stairs. He is going to the gym 3-4 times/week and using weights and treadmill. No lightheadedness. No chest pain.  No orthopnea/PND.  Feeling stronger overall.  No palpitations, in NSR today. Weight down 3 lbs.   ECG (personally reviewed): NSR, 1st degree AVB, poor RWP.   Labs (3/20): K 4.5, creatinine 1.45 => 1.71, LFTs normal, hgb 11.2  PMH: 1. Metastatic non-small cell lung cancer: he is getting alectinib with good response so far.   - Liver mets 2.  Prostate cancer: Untreated.  3. Atrial fibrillation: Paroxysmal.  4. 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  5. Chronic systolic CHF: Nonischemic cardiomyopathy.   - Echo (7/19): EF 15%.  - LHC at [facility] 01/02/2018: No significant coronary disease.   - Echo (10/19): Ef 25-30%, moderate LV dilation, mild LVH.  6. HTN 7. CKD: Stage 3  Review of systems complete and found to be negative unless listed in HPI.    Current Outpatient Medications  Medication Sig Dispense Refill  . ALECENSA 150 MG capsule TAKE 4 CAPSULES BY MOUTH TWICE A DAY WITH A MEAL 240 capsule 2  . amiodarone (PACERONE) 100 MG tablet Take 1 tablet (100 mg total) by mouth daily. 30 tablet 6  . apixaban (ELIQUIS) 5 MG TABS tablet Take 1 tablet (5 mg total) by mouth 2 (two) times daily. 60 tablet 5  . Cholecalciferol (CVS VIT D 5000 HIGH-POTENCY) 5000 units capsule Take 5,000 Units by mouth daily.    . furosemide (LASIX) 80 MG tablet Take 1 tab (80 mg) in the morning 30 tablet 3  . Multiple Vitamin (MULTIVITAMIN) tablet Take 1 tablet by mouth daily.    . sacubitril-valsartan (ENTRESTO) 24-26 MG Take 1 tablet by mouth 2 (two) times daily. 180 tablet 3  . traZODone (DESYREL) 50 MG tablet Take 1 tablet (50 mg total) by mouth at bedtime as needed for sleep. 20 tablet 0  . carvedilol (COREG) 3.125 MG tablet Take 1 tablet (3.125 mg total) by mouth 2 (two) times daily. 60 tablet 3  .  digoxin (LANOXIN) 0.125 MG tablet Take 1 tablet (0.125 mg total) by mouth daily. (Patient not taking: Reported on 05/23/2018) 90 tablet 3  . potassium chloride SA (KLOR-CON M20) 20 MEQ tablet Take 1 tablet (20 mEq total) by mouth daily. 30 tablet 3  . spironolactone (ALDACTONE) 25 MG tablet Take 0.5 tablets (12.5 mg total) by mouth daily. 15 tablet 3   No current facility-administered medications for this encounter.    No Known Allergies  Social History   Socioeconomic History  . Marital status: Married    Spouse  name: Not on file  . Number of children: Not on file  . Years of education: Not on file  . Highest education level: Not on file  Occupational History  . Not on file  Social Needs  . Financial resource strain: Not on file  . Food insecurity:    Worry: Not on file    Inability: Not on file  . Transportation needs:    Medical: Not on file    Non-medical: Not on file  Tobacco Use  . Smoking status: Former Smoker    Years: 6.50    Types: Cigarettes, Cigars, Pipe    Last attempt to quit: 03/14/1974    Years since quitting: 44.2  . Smokeless tobacco: Never Used  Substance and Sexual Activity  . Alcohol use: Yes    Alcohol/week: 0.0 standard drinks    Comment: 09/26/2017 "holidays only"  . Drug use: Never  . Sexual activity: Yes  Lifestyle  . Physical activity:    Days per week: Not on file    Minutes per session: Not on file  . Stress: Not on file  Relationships  . Social connections:    Talks on phone: Not on file    Gets together: Not on file    Attends religious service: Not on file    Active member of club or organization: Not on file    Attends meetings of clubs or organizations: Not on file    Relationship status: Not on file  . Intimate partner violence:    Fear of current or ex partner: Not on file    Emotionally abused: Not on file    Physically abused: Not on file    Forced sexual activity: Not on file  Other Topics Concern  . Not on file  Social History Narrative  . Not on file   Family History  Problem Relation Age of Onset  . Parkinson's disease Mother   . Hypertension Father    Vitals:   05/23/18 0908  BP: 104/70  Pulse: (!) 58  SpO2: 98%  Weight: 102.9 kg (226 lb 12.8 oz)   Wt Readings from Last 3 Encounters:  05/23/18 102.9 kg (226 lb 12.8 oz)  05/22/18 102.5 kg (226 lb)  02/19/18 104.2 kg (229 lb 12.8 oz)   PHYSICAL EXAM: General: NAD Neck: No JVD, no thyromegaly or thyroid nodule.  Lungs: Clear to auscultation bilaterally with normal  respiratory effort. CV: Nondisplaced PMI.  Heart regular S1/S2, no S3/S4, no murmur.  No peripheral edema.  No carotid bruit.  Normal pedal pulses.  Abdomen: Soft, nontender, no hepatosplenomegaly, no distention.  Skin: Intact without lesions or rashes.  Neurologic: Alert and oriented x 3.  Psych: Normal affect. Extremities: No clubbing or cyanosis.  HEENT: Normal.   ASSESSMENT & PLAN:  1. Chronic systolic CHF: Long history of nonischemic cardiomyopathy. Last echo in 10/19 with EF 25-30%. He does not have an ICD due to metastatic lung cancer  and untreated prostate cancer. NYHA class II symptoms.  He is not volume overloaded on exam.  - Continue lasix 80 mg daily. - Continue Entresto 24/26 mg BID.  - Continue Coreg 3.125 mg bid.  - Start spironolactone 12.5 mg daily and decrease KCl to 20 daily.  BMET in 10 days.   -ICD not planned due to metastatic cancer diagnosis though he is doing well on therapy with alectinib.  - Given clinical stability terms of his oncological diagnoses, I think that it would be reasonable to pursue Cardiomems.  We are trying to get insurance approval.  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 Good response so far with alectinib.  - Follows with Dr Burr Medico. Tumor is getting smaller. Continue current regimen.   3. Atrial fibrillation: Paroxysmal. He is in NSR today.  - Continue amiodarone 100 mg daily. Recent LFTs normal, check TSH with next labs.  He will need regular eye exam.  - Continue Eliquis 5 mg BID.  4. CKD: Stage 3.  5. Prostate cancer: Untreated.  6. 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  He is on Eliquis.   Followup with pharmacy clinic for med titration in 2 wks.  See me in 2 months.   Loralie Champagne, MD 05/23/18  Greater than 50% of the 25 minute visit was spent in counseling/coordination of care regarding disease state education, salt/fluid restriction, sliding scale diuretics, and  medication compliance.

## 2018-06-11 ENCOUNTER — Other Ambulatory Visit (HOSPITAL_COMMUNITY): Payer: 59

## 2018-06-25 ENCOUNTER — Telehealth (HOSPITAL_COMMUNITY): Payer: Self-pay | Admitting: Pharmacist

## 2018-06-25 ENCOUNTER — Inpatient Hospital Stay (HOSPITAL_COMMUNITY): Admission: RE | Admit: 2018-06-25 | Payer: 59 | Source: Ambulatory Visit

## 2018-06-25 NOTE — Telephone Encounter (Addendum)
Patient called for tele call for pharmacy clinic - no office visit d/t COVID restrictions.   He feels great.  He walks in neighborhood 67min almost every day with no SOB.  He has changed his diet to ensure low sodium. He is compliant with medications  - except he has stopped digoxin since his visit 12/19.    He weighs daily Wt about 217lb  Stable and has no swelling in his belly or his ankles / feet. BP 105-117/70s,  HR 60s  He has not followed up lab check since starting spironolactone early March and I will send orders to [facility] near him in [place] to prevent a trip to [place].   Bonnita Nasuti Pharm.D. CPP, BCPS Clinical Pharmacist [phone] 06/25/2018 2:38 PM

## 2018-07-06 ENCOUNTER — Other Ambulatory Visit (HOSPITAL_COMMUNITY): Payer: Self-pay | Admitting: Student

## 2018-07-09 ENCOUNTER — Other Ambulatory Visit (HOSPITAL_COMMUNITY): Payer: Self-pay | Admitting: Cardiology

## 2018-07-09 MED ORDER — APIXABAN 5 MG PO TABS: 5.0000 mg | ORAL_TABLET | Freq: Two times a day (BID) | ORAL | 3 refills | Status: DC

## 2018-07-09 MED ORDER — AMIODARONE HCL 100 MG PO TABS: 100.0000 mg | ORAL_TABLET | Freq: Every day | ORAL | 3 refills | Status: DC

## 2018-07-09 MED ORDER — SACUBITRIL-VALSARTAN 24-26 MG PO TABS: 1.0000 | ORAL_TABLET | Freq: Two times a day (BID) | ORAL | 3 refills | Status: DC

## 2018-07-20 ENCOUNTER — Other Ambulatory Visit (HOSPITAL_COMMUNITY): Payer: Self-pay | Admitting: Cardiology

## 2018-08-02 ENCOUNTER — Other Ambulatory Visit (HOSPITAL_COMMUNITY): Payer: Self-pay | Admitting: Cardiology

## 2018-08-02 ENCOUNTER — Encounter (HOSPITAL_COMMUNITY): Payer: 59 | Admitting: Cardiology

## 2018-08-03 ENCOUNTER — Other Ambulatory Visit (HOSPITAL_COMMUNITY): Payer: Self-pay

## 2018-08-03 MED ORDER — CARVEDILOL 3.125 MG PO TABS: 3.1250 mg | ORAL_TABLET | Freq: Two times a day (BID) | ORAL | 1 refills | Status: DC

## 2018-08-07 ENCOUNTER — Other Ambulatory Visit: Payer: Self-pay

## 2018-08-07 ENCOUNTER — Telehealth (HOSPITAL_COMMUNITY): Payer: Self-pay | Admitting: Cardiology

## 2018-08-07 ENCOUNTER — Ambulatory Visit (HOSPITAL_COMMUNITY)
Admission: RE | Admit: 2018-08-07 | Discharge: 2018-08-07 | Disposition: A | Discharge status: Home / Self Care | Payer: 59 | Source: Ambulatory Visit | Attending: Cardiology | Admitting: Cardiology

## 2018-08-07 ENCOUNTER — Encounter (HOSPITAL_COMMUNITY): Payer: Self-pay

## 2018-08-07 ENCOUNTER — Encounter (HOSPITAL_COMMUNITY): Payer: Self-pay | Admitting: Cardiology

## 2018-08-07 VITALS — Wt 221.0 lb

## 2018-08-07 DIAGNOSIS — I480 Paroxysmal atrial fibrillation: Secondary | ICD-10-CM

## 2018-08-07 DIAGNOSIS — I428 Other cardiomyopathies: Secondary | ICD-10-CM

## 2018-08-07 DIAGNOSIS — I5022 Chronic systolic (congestive) heart failure: Secondary | ICD-10-CM

## 2018-08-07 DIAGNOSIS — Z7901 Long term (current) use of anticoagulants: Secondary | ICD-10-CM

## 2018-08-07 NOTE — Telephone Encounter (Signed)
-----   Message from Conrad [place], NP sent at 08/07/2018  2:17 PM EDT ----- Regarding: avs Check CBC, TSH, BMET, LFts this week.   Recommended follow-up: Follow 2 months with Dr Aundra Dubin and an ECHO.

## 2018-08-07 NOTE — Patient Instructions (Addendum)
It was great to speaking with you today! No medication changes are needed at this time.  Labs are needed in one week 08/09/18 @1 :45 pm Parking code [place] recommends that you schedule a follow-up appointment in: 2-3 months with Dr. Aundra Dubin and echo 11/08/18 echo @ 9 am Dr Aundra Dubin @ 1020 Parking code [place] has requested that you have an echocardiogram. Echocardiography is a painless test that uses sound waves to create images of your heart. It provides your doctor with information about the size and shape of your heart and how well your heart's chambers and valves are working. This procedure takes approximately one hour. There are no restrictions for this procedure.

## 2018-08-07 NOTE — Progress Notes (Signed)
Heart Failure TeleHealth Note  Due to national recommendations of social distancing due to [place] 19, Audio/video telehealth visit is felt to be most appropriate for this patient at this time.  See MyChart message from today for patient consent regarding telehealth for [facility].  Date:  08/07/2018   ID:  [name], DOB 07-Apr-1950, MRN [id number]  Location: Home  Provider location: [facility] Type of Visit: Established patient   PCP:  [name], MD  Cardiologist:  Sinclair Grooms, MD Primary HF: Dr Aundra Dubin  Chief Complaint: Heart Failure    History of Present Illness: Logan [name] is a 68 y.o. male with a history of HTN, paroxysmal atrial fibrillation, OSA, untreated prostate cancer 2012 , RLL mass 2015 declined treatment, and chronic systolic heart failure.   Admitted 1/32-06/16/99 with A/C systolic HF. Required thoracentesis 7/17. Echo showed EF 15% with biventricular failure. Course complicated by Afib RVR and associated flash pulmonary edema. Required intubation and pressors. Diuresed with IV lasix. Oncology consulted for lung cancer (RLL) with liver mets. MRI brain was negative for mets, but showed small left cerebellar infarct. He had hemoptysis with heparin, so he was only discharged on ASA 81 mg daily. HF meds were optimized. He was eventually started on apixaban.   He saw Dr Burr Medico and was started on Alectinib, which has potential cardiac toxicity, especially bradycardia.   Admitted to [facility] with increased dyspnea on 01/02/18. Had PEA arrest and required intubation. LHC with no coronary disease. LVEDP was 40 so he was diuresed with IV lasix and later transitioned to 40 mg po lasix. Discharge weight 221 pound. He was discharged to home  01/06/2018 .   He presents via Psychiatric nurse for a telehealth visit today.   Overall feeling fine. Able to walk 1 mile. Mild dyspnea with incline. Denies PND/Orthopnea. No bleeding  problems. He denies palpitations. Appetite ok. No fever or chills. Weight at home 221 pounds. Taking all medications. Lives with wife.   he denies symptoms worrisome for COVID 19.   Past Medical History:  Diagnosis Date  . CHF (congestive heart failure) ([place])   . Chronic systolic heart failure ([place]) 04/15/2015    EF 30-35% by echo 2016 , [facility]   . Hypertension   . Lung mass 04/15/2015    Never biopsied. History of PET scan that raised concern.   . Metabolic syndrome 0/04/7251  . OSA on CPAP   . Paroxysmal atrial fibrillation ([place]) 04/15/2015   On apixaban   . Prostate cancer ([place])   . Thrombocytopenia ([place]) 04/15/2015   Past Surgical History:  Procedure Laterality Date  . CARDIAC CATHETERIZATION N/A 04/27/2015   Procedure: Right/Left Heart Cath and Coronary Angiography;  Surgeon: [name], MD;  Location: [place] CV LAB;  Service: Cardiovascular;  Laterality: N/A;  . CARDIOVERSION N/A 09/29/2017   Procedure: CARDIOVERSION;  Surgeon: Pixie Casino, MD;  Location: [place];  Service: Cardiovascular;  Laterality: N/A;  . IR THORACENTESIS ASP PLEURAL SPACE W/IMG GUIDE  09/27/2017  . PROSTATE BIOPSY    . TEE WITHOUT CARDIOVERSION N/A 09/29/2017   Procedure: TRANSESOPHAGEAL ECHOCARDIOGRAM (TEE);  Surgeon: Pixie Casino, MD;  Location: [facility] ENDOSCOPY;  Service: Cardiovascular;  Laterality: N/A;     Current Outpatient Medications  Medication Sig Dispense Refill  . ALECENSA 150 MG capsule TAKE 4 CAPSULES BY MOUTH TWICE A DAY WITH A MEAL 240 capsule 2  . amiodarone (PACERONE) 100 MG tablet Take 1  tablet (100 mg total) by mouth daily. 90 tablet 3  . apixaban (ELIQUIS) 5 MG TABS tablet Take 1 tablet (5 mg total) by mouth 2 (two) times daily. 180 tablet 3  . carvedilol (COREG) 3.125 MG tablet Take 1 tablet (3.125 mg total) by mouth 2 (two) times daily. 180 tablet 1  . Cholecalciferol (CVS VIT D 5000 HIGH-POTENCY) 5000 units capsule Take 5,000 Units by mouth daily.    . digoxin  (LANOXIN) 0.125 MG tablet Take 1 tablet (0.125 mg total) by mouth daily. 90 tablet 3  . furosemide (LASIX) 80 MG tablet Take 1 tab (80 mg) in the morning 30 tablet 3  . Multiple Vitamin (MULTIVITAMIN) tablet Take 1 tablet by mouth daily.    . potassium chloride SA (KLOR-CON M20) 20 MEQ tablet Take 1 tablet (20 mEq total) by mouth daily. (Patient taking differently: Take 20 mEq by mouth 2 (two) times daily. ) 30 tablet 3  . sacubitril-valsartan (ENTRESTO) 24-26 MG Take 1 tablet by mouth 2 (two) times daily. 180 tablet 3  . spironolactone (ALDACTONE) 25 MG tablet TAKE 1/2 TABLET EVERY DAY 45 tablet 0  . traZODone (DESYREL) 50 MG tablet Take 1 tablet (50 mg total) by mouth at bedtime as needed for sleep. (Patient not taking: Reported on 06/25/2018) 20 tablet 0   No current facility-administered medications for this encounter.     Allergies:   Patient has no known allergies.   Social History:  The patient  reports that he quit smoking about 44 years ago. His smoking use included cigarettes, cigars, and pipe. He quit after 6.50 years of use. He has never used smokeless tobacco. He reports current alcohol use. He reports that he does not use drugs.   Family History:  The patient's family history includes Hypertension in his father; Parkinson's disease in his mother.   ROS:  Please see the history of present illness.   All other systems are personally reviewed and negative.   Exam:  Tele Health Call; Exam is subjective  General:  Speaks in full sentences. No resp difficulty. Lungs: Normal respiratory effort with conversation.  Abdomen: Non-distended per patient report Extremities: Pt denies edema. Neuro: Alert & oriented x 3.   Recent Labs: 10/03/2017: B Natriuretic Peptide 362.5 10/11/2017: Magnesium 2.2 11/17/2017: TSH 5.745 05/15/2018: ALT 20; BUN 26; Creatinine 1.71; Hemoglobin 11.2; Platelet Count 139; Potassium 4.5; Sodium 142  Personally reviewed   Wt Readings from Last 3 Encounters:   08/07/18 100.2 kg (221 lb)  05/23/18 102.9 kg (226 lb 12.8 oz)  05/22/18 102.5 kg (226 lb)      ASSESSMENT AND PLAN:  1. Chronic systolic CHF: Long history of nonischemic cardiomyopathy. Last echo in 10/19 with EF 25-30%. He does not have an ICD due to metastatic lung cancer and untreated prostate cancer.  -NYHA II-III. Continue lasix 80 mg daily. Check BMET  - Continue Entresto 24/26 mg BID.  - Continue Coreg 3.125 mg bid.  - Continue spironolactone 12.5 mg daily  -ICD not planned due to metastatic cancer diagnosis though he is doing well on therapy with alectinib.  - Given clinical stability terms of his oncological diagnoses. We are trying to get insurance approval.  2.Lung cancer:Stage IV with liver mets, adenocarcinoma. Good response so far with alectinib. - Follows with Dr Burr Medico. Tumor is getting smaller. Continue current regimen.   3. Atrial fibrillation: Paroxysmal.  - Continue amiodarone 100 mg daily. Recent LFTs normal, check TSH with next labs.  He will need regular eye  exam.   - Continue Eliquis 5 mg BID.  - Check CBC  4. CKD: Stage 3.  Check BMET  5. Prostate cancer: Untreated.  6. 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  He is on Eliquis.    COVID screen The patient does not have any symptoms that suggest any further testing/ screening at this time.  Social distancing reinforced today.  Patient Risk: After full review of this patients clinical status, I feel that they are at moderate risk for cardiac decompensation at this time.  Relevant cardiac medications were reviewed at length with the patient today. The patient does not have concerns regarding their medications at this time.   The following changes were made today:  Check CBC, TSH, BMET, LFts this week.  Recommended follow-up: Follow 2 months with Dr Aundra Dubin and an ECHO.  Today, I have spent 15 minutes with the patient with telehealth technology discussing the  above issues .    Jeanmarie Hubert, NP  08/07/2018 2:00 PM  [place] [address] Heart and [place] [address] [phone] (office) [phone] (fax)

## 2018-08-07 NOTE — Addendum Note (Signed)
Encounter addended by: Kerry Dory, CMA on: 08/07/2018 3:03 PM  Actions taken: Order list changed, Diagnosis association updated, Clinical Note Signed

## 2018-08-07 NOTE — Telephone Encounter (Signed)
Labs 5/28 Follow up 8/27  avs instructions verbally reviewed with patient and avs sent via my chart messages

## 2018-08-09 ENCOUNTER — Ambulatory Visit (HOSPITAL_COMMUNITY)
Admission: RE | Admit: 2018-08-09 | Discharge: 2018-08-09 | Disposition: A | Discharge status: Home / Self Care | Payer: 59 | Source: Ambulatory Visit | Attending: Cardiology | Admitting: Cardiology

## 2018-08-09 ENCOUNTER — Other Ambulatory Visit: Payer: Self-pay

## 2018-08-09 DIAGNOSIS — I5022 Chronic systolic (congestive) heart failure: Secondary | ICD-10-CM | POA: Insufficient documentation

## 2018-08-09 LAB — HEPATIC FUNCTION PANEL
ALT: 30 U/L (ref 0–44)
AST: 27 U/L (ref 15–41)
Albumin: 4.2 g/dL (ref 3.5–5.0)
Alkaline Phosphatase: 61 U/L (ref 38–126)
Bilirubin, Direct: 0.1 mg/dL (ref 0.0–0.2)
Indirect Bilirubin: 0.7 mg/dL (ref 0.3–0.9)
Total Bilirubin: 0.8 mg/dL (ref 0.3–1.2)
Total Protein: 7 g/dL (ref 6.5–8.1)

## 2018-08-09 LAB — CBC
HCT: 34.3 % — ABNORMAL LOW (ref 39.0–52.0)
Hemoglobin: 11.8 g/dL — ABNORMAL LOW (ref 13.0–17.0)
MCH: 31.6 pg (ref 26.0–34.0)
MCHC: 34.4 g/dL (ref 30.0–36.0)
MCV: 91.7 fL (ref 80.0–100.0)
Platelets: 155 10*3/uL (ref 150–400)
RBC: 3.74 MIL/uL — ABNORMAL LOW (ref 4.22–5.81)
RDW: 13.3 % (ref 11.5–15.5)
WBC: 5 10*3/uL (ref 4.0–10.5)
nRBC: 0 % (ref 0.0–0.2)

## 2018-08-09 LAB — BASIC METABOLIC PANEL
Anion gap: 8 (ref 5–15)
BUN: 20 mg/dL (ref 8–23)
CO2: 25 mmol/L (ref 22–32)
Calcium: 9.1 mg/dL (ref 8.9–10.3)
Chloride: 104 mmol/L (ref 98–111)
Creatinine, Ser: 1.76 mg/dL — ABNORMAL HIGH (ref 0.61–1.24)
GFR calc Af Amer: 45 mL/min — ABNORMAL LOW (ref >60)
GFR calc non Af Amer: 39 mL/min — ABNORMAL LOW (ref >60)
Glucose, Bld: 96 mg/dL (ref 70–99)
Potassium: 3.9 mmol/L (ref 3.5–5.1)
Sodium: 137 mmol/L (ref 135–145)

## 2018-08-09 LAB — TSH: TSH: 1.729 u[IU]/mL (ref 0.350–4.500)

## 2018-08-15 ENCOUNTER — Other Ambulatory Visit (HOSPITAL_COMMUNITY): Payer: Self-pay | Admitting: Cardiology

## 2018-09-03 ENCOUNTER — Other Ambulatory Visit: Payer: Self-pay | Admitting: Hematology

## 2018-09-03 DIAGNOSIS — C3491 Malignant neoplasm of unspecified part of right bronchus or lung: Secondary | ICD-10-CM

## 2018-09-05 ENCOUNTER — Telehealth: Payer: Self-pay | Admitting: *Deleted

## 2018-09-05 NOTE — Telephone Encounter (Signed)
Received call from Clarise Cruz, pharmacy tech at [facility] requesting refill for  Alectinib 150 mg for pt. Script can be faxed to    [phone]. [facility]   Phone     [phone].

## 2018-09-06 ENCOUNTER — Telehealth: Payer: Self-pay | Admitting: Hematology

## 2018-09-06 NOTE — Telephone Encounter (Signed)
Scheduled appt per 6/24 sch message- pt aware of new appt date and time - per pt does not want to come in any sooner than end  July

## 2018-09-10 ENCOUNTER — Telehealth: Payer: Self-pay

## 2018-09-10 NOTE — Telephone Encounter (Signed)
Patient's wife calls with regards to scheduling CT scan, she was given direct number to central scheduling to call and try to get it on 7/22 after his lab appointment, also his insurance has changed and she was told by [place] that she needed to call our office regarding the [place]. I explained I would check into this for her and let her know what I find out.

## 2018-09-10 NOTE — Telephone Encounter (Addendum)
Oral Oncology Patient Advocate Encounter  The patient no longer has Pharmacist, community. He now has [employer] part D. Alecensa copay is [id number]. There are no grants available for the patients disease state.  I can help the patient apply for manufacturer assistance with [employer]dora Hinders in an effort to make the out of pocket cost for the patient $0.  I called [name] and went over this information with her. She agreed to proceed with the application process.  I will need the patients signature on the patient consent form. Veronica requested that I email that form to the patient and he will sign and email back.   I emailed that form to [email or web address].  I will continue to update until final determination.  [place] Patient [place] Phone [phone] Fax [phone] 09/10/2018    2:05 PM

## 2018-09-11 NOTE — Telephone Encounter (Signed)
Oral Oncology Patient Advocate Encounter  I faxed the completed [place] application today and received a successful fax notification.  I will continue to update until final determination.  [place] Patient [place] Phone [phone] Fax [phone] 09/11/2018   8:07 AM

## 2018-09-13 NOTE — Telephone Encounter (Signed)
Oral Oncology Patient Advocate Encounter  I called Logan [name] to follow up on the [place] application today.  I was advised that the office is closed until Monday 7/6. I was able to leave a voicemail.  This encounter will be updated until final determination.  [name] Patient [name] Phone [phone] Fax [phone] 09/13/2018   10:07 AM

## 2018-09-18 ENCOUNTER — Encounter (HOSPITAL_COMMUNITY): Payer: Self-pay | Admitting: *Deleted

## 2018-09-18 ENCOUNTER — Other Ambulatory Visit (HOSPITAL_COMMUNITY): Payer: Self-pay | Admitting: Cardiology

## 2018-09-18 NOTE — Progress Notes (Signed)
Info form and records for cardiomems faxed in to [phone] on 09/13/2018

## 2018-09-19 NOTE — Telephone Encounter (Signed)
Oral Oncology Patient Advocate Encounter  Logan [name] requested an additional physician form.  I will get this form completed and fax it back to [place].  [place] Patient [place] Phone [phone] Fax [phone] 09/19/2018   10:39 AM

## 2018-09-19 NOTE — Telephone Encounter (Signed)
Oral Oncology Patient Advocate Encounter  I faxed the completed form today, 7/8.  This encounter will be updated until final determination.  [place] Patient [place] Phone [phone] Fax [phone] 09/19/2018   3:55 PM

## 2018-09-20 NOTE — Telephone Encounter (Signed)
Oral Oncology Patient Advocate Encounter  I returned a call to [place] from [place]. Lelan Pons informed me that she received the fax from yesterday and she sent it over for the application to be processed.  I was transferred to the processing team and then the pharmacy team who said they were in the final stages and we should receive a fax determination in the next 24-48 hours.  I called the patient and gave him this update, he verbalized understanding and great appreciation.  This encounter will be updated until final determination.  [place] Patient [place] Phone [phone] Fax [phone] 09/20/2018   10:25 AM

## 2018-09-21 NOTE — Telephone Encounter (Signed)
Oral Oncology Patient Advocate Encounter  I called [place] [facility] to follow up on the alecensa application.   The pharmacist I spoke with requested that I refax the patient consent form again since she still does not see it in their system. She gave me an alternate fax number this time.  I faxed the patient consent form again today, 7/10.  This encounter will be updated until final determination.  [place] Patient [place] [employer] Phone [phone] Fax [phone] 09/21/2018   1:51 PM

## 2018-09-24 NOTE — Telephone Encounter (Signed)
Oral Oncology Patient Advocate Encounter  I called [employer] to follow up on the Alecensa application.  They have received everything they need from [place] and will take 24-48 hours to process.  This encounter will be updated until final determination.  [place] Patient [place] Phone [phone] Fax [phone] 09/24/2018   1:16 PM

## 2018-09-27 ENCOUNTER — Other Ambulatory Visit (HOSPITAL_COMMUNITY): Payer: Self-pay | Admitting: Cardiology

## 2018-09-27 NOTE — Telephone Encounter (Signed)
Oral Oncology Patient Advocate Encounter  Received notification from [facility] Patient Assistance program that patient has been successfully enrolled into their program to receive Alecensa from the manufacturer at $0 out of pocket until 09/23/21.    I called and spoke with patient, he requested that I send the approval information in an email to him. I will get that email sent to him today.   [place] Patient [place] Phone [phone] Fax [phone] 09/27/2018    12:21 PM

## 2018-10-03 ENCOUNTER — Inpatient Hospital Stay: Payer: Medicare Other | Attending: Hematology

## 2018-10-03 ENCOUNTER — Other Ambulatory Visit: Payer: Self-pay

## 2018-10-03 ENCOUNTER — Ambulatory Visit (HOSPITAL_COMMUNITY)
Admission: RE | Admit: 2018-10-03 | Discharge: 2018-10-03 | Disposition: A | Discharge status: Home / Self Care | Payer: Medicare Other | Source: Ambulatory Visit | Attending: Hematology | Admitting: Hematology

## 2018-10-03 DIAGNOSIS — N183 Chronic kidney disease, stage 3 (moderate): Secondary | ICD-10-CM | POA: Diagnosis not present

## 2018-10-03 DIAGNOSIS — Z7901 Long term (current) use of anticoagulants: Secondary | ICD-10-CM | POA: Insufficient documentation

## 2018-10-03 DIAGNOSIS — C61 Malignant neoplasm of prostate: Secondary | ICD-10-CM | POA: Insufficient documentation

## 2018-10-03 DIAGNOSIS — J90 Pleural effusion, not elsewhere classified: Secondary | ICD-10-CM | POA: Diagnosis not present

## 2018-10-03 DIAGNOSIS — C787 Secondary malignant neoplasm of liver and intrahepatic bile duct: Secondary | ICD-10-CM | POA: Diagnosis not present

## 2018-10-03 DIAGNOSIS — I4891 Unspecified atrial fibrillation: Secondary | ICD-10-CM | POA: Diagnosis not present

## 2018-10-03 DIAGNOSIS — C3491 Malignant neoplasm of unspecified part of right bronchus or lung: Secondary | ICD-10-CM | POA: Diagnosis not present

## 2018-10-03 LAB — CBC WITH DIFFERENTIAL (CANCER CENTER ONLY)
Abs Immature Granulocytes: 0.02 10*3/uL (ref 0.00–0.07)
Basophils Absolute: 0.1 10*3/uL (ref 0.0–0.1)
Basophils Relative: 1 %
Eosinophils Absolute: 0.1 10*3/uL (ref 0.0–0.5)
Eosinophils Relative: 2 %
HCT: 33.8 % — ABNORMAL LOW (ref 39.0–52.0)
Hemoglobin: 11.7 g/dL — ABNORMAL LOW (ref 13.0–17.0)
Immature Granulocytes: 0 %
Lymphocytes Relative: 42 %
Lymphs Abs: 2.2 10*3/uL (ref 0.7–4.0)
MCH: 31 pg (ref 26.0–34.0)
MCHC: 34.6 g/dL (ref 30.0–36.0)
MCV: 89.7 fL (ref 80.0–100.0)
Monocytes Absolute: 0.5 10*3/uL (ref 0.1–1.0)
Monocytes Relative: 9 %
Neutro Abs: 2.4 10*3/uL (ref 1.7–7.7)
Neutrophils Relative %: 46 %
Platelet Count: 159 10*3/uL (ref 150–400)
RBC: 3.77 MIL/uL — ABNORMAL LOW (ref 4.22–5.81)
RDW: 12.8 % (ref 11.5–15.5)
WBC Count: 5.1 10*3/uL (ref 4.0–10.5)
nRBC: 0 % (ref 0.0–0.2)

## 2018-10-03 LAB — CMP (CANCER CENTER ONLY)
ALT: 15 U/L (ref 0–44)
AST: 19 U/L (ref 15–41)
Albumin: 4.1 g/dL (ref 3.5–5.0)
Alkaline Phosphatase: 58 U/L (ref 38–126)
Anion gap: 10 (ref 5–15)
BUN: 18 mg/dL (ref 8–23)
CO2: 26 mmol/L (ref 22–32)
Calcium: 9.4 mg/dL (ref 8.9–10.3)
Chloride: 105 mmol/L (ref 98–111)
Creatinine: 1.48 mg/dL — ABNORMAL HIGH (ref 0.61–1.24)
GFR, Est AFR Am: 56 mL/min — ABNORMAL LOW (ref >60)
GFR, Estimated: 48 mL/min — ABNORMAL LOW (ref >60)
Glucose, Bld: 94 mg/dL (ref 70–99)
Potassium: 3.9 mmol/L (ref 3.5–5.1)
Sodium: 141 mmol/L (ref 135–145)
Total Bilirubin: 0.4 mg/dL (ref 0.3–1.2)
Total Protein: 7.4 g/dL (ref 6.5–8.1)

## 2018-10-03 MED ORDER — IOHEXOL 300 MG/ML  SOLN
30.0000 mL | Freq: Once | INTRAMUSCULAR | Status: AC | PRN
  Administered 2018-10-03: 30 mL via ORAL

## 2018-10-08 NOTE — Progress Notes (Signed)
[place]   [phone] [phone]   Clinic Follow up Note   Patient Care Team: [name], MD as PCP - General (Family Medicine) [name], MD as PCP - Cardiology (Cardiology) [name], MD as PCP - Advanced Heart Failure (Cardiology) [name], MD as Consulting Physician (Cardiology)  Date of Service:  10/10/2018  CHIEF COMPLAINT: F/u on metastatic lung adenocarcinoma, ALK(+)  SUMMARY OF ONCOLOGIC HISTORY: Oncology History Overview Note  Cancer Staging Non-small cell lung cancer (NSCLC) ([place]) Staging form: Lung, AJCC 8th Edition - Clinical stage from 10/02/2017: Stage IV (cT3, cN2, cM1c) - Signed by Truitt Merle, MD on 10/20/2017    Non-small cell lung cancer (NSCLC) ([place])  04/01/2013 Imaging   04/01/2013 CXR IMPRESSION: 1. Cardiomegaly with diffuse increased interstitial markings consistent with pulmonary edema. 2. 5-6 cm rounded density seen in the right lower lobe medially. This could be related to an area of consolidation correlate clinically for pneumonia.. But the rounded and well-defined borders are suspicious for mass. Followup x-ray suggested after  treating for the pulmonary edema/pneumonia to see if this resolves.   04/30/2014 Imaging   04/30/2014 CXR IMPRESSION: Enlarging right lower lobe nodule. Would recommend PET CT to further evaluate.   01/20/2015 Imaging   01/20/2015 CXR IMPRESSION: Significant improvement comparatively, now with smaller pleural effusions and resolving pulmonary edema. Known right lower lobe mass suspicious for bronchogenic carcinoma is redemonstrated.   04/07/2015 PET scan   04/07/2015 PET Scan IMPRESSION:  Hypermetabolic right lower lobe mass Hypermetabolic left adrenal nodule.   08/21/2015 Imaging   08/21/2015 CXR IMPRESSION: Right lower lobe mass.   09/22/2017 Imaging   09/22/2017 CXR IMPRESSION: 1.Moderate right pleural effusion with right lower/middle lobe consolidation. Minimal  patchy opacities in left lower lobe. 2.Cardiomegaly.   09/26/2017 Imaging   09/26/2017 CT Chest WO Contrast IMPRESSION: 1. Large RIGHT lower lobe mass with central and peripheral calcifications occupies the near entirety of the RIGHT lower lobe and concerning for neoplasm. 2. Moderate RIGHT effusion. 3. Mild mediastinal and RIGHT hilar adenopathy. 4. Recommend FDG PET scan for biopsy planning/staging   09/26/2017 Tumor Marker    Ref. Range 09/26/2017 21:31  Prostatic Specific Antigen Latest Ref Range: 0.00 - 4.00 ng/mL 84.83 (H)      09/27/2017 Imaging   09/27/2017 CT AP W WO Contrast IMPRESSION: 1. Large complex masses in the right lower lobe of the lung and in the right hepatic lobe. Smaller liver lesions in the left hepatic lobe. Nonspecific lesions in the spleen and kidneys. No significant pathologic adenopathy. This would not be a characteristic metastatic pattern for prostate cancer and is not a specific metastatic and imaging pattern to suggest an individual etiology. Possibilities might include metastatic lung cancer, melanoma, metastatic hepatocellular carcinoma, or a variety of other possibilities. Tissue diagnosis recommended. 2. Moderate prostatomegaly. 3. Scattered mild ascites. 4. Moderate cardiomegaly.  Small inferior pericardial effusion. 5. Trace bilateral pleural effusions.   10/02/2017 Pathology Results   10/02/2017 Surgical Pathology Diagnosis Liver, needle/core biopsy, Left Hepatic Lobe - METASTATIC LUNG ADENOCARCINOMA TO THE LIVER. SEE NOTE   10/02/2017 Cancer Staging   Staging form: Lung, AJCC 8th Edition - Clinical stage from 10/02/2017: Stage IV (cT3, cN2, cM1c) - Signed by Truitt Merle, MD on 10/20/2017   10/04/2017 Initial Diagnosis   Non-small cell lung cancer (NSCLC) ([place])   10/21/2017 -  Antibody Plan   Alectinib 661m bid started on 10/21/2017   01/10/2018 PET scan   01/10/2018 PET Scan IMPRESSION:  1. Marked reduction in size of the right lower  lobe mass with fairly low metabolic activity in the residual mass, maximum SUV 5.3. Notably reduced thoracic adenopathy which is not currently hypermetabolic. 2. Marked reduction in size and conspicuity of the hepatic tumors, both of which have metabolic activity similar to that of the background liver parenchyma. 3. Resolution of the splenic mass. No additional or new lesions are identified. 4. Accentuated activity anteriorly in the left seventh rib, low-grade with maximum SUV 3.5, without appreciable CT abnormality on the CT data, significance uncertain. 5. Accentuated activity at the right sternoclavicular joint, SUV 7.2, probably from arthropathy. 6. Accentuated activity in the posterior and apical portions of the prostate gland, significance uncertain. 7. Other imaging findings of potential clinical significance: Chronic left maxillary sinusitis.   10/03/2018 Imaging   CT CAP W contrast 10/03/18 IMPRESSION: 1. No new or progressive findings on today's study. 2. Continued further slight reduction in size of the right lower mass. No lymphadenopathy in the chest. 3. Continued further decrease in size of liver lesions with a left hepatic lesion noted previously not apparent on today's study. 4. Stable 6 mm ill-defined subpleural nodule right upper lobe. Continued attention on follow-up recommended.      CURRENT THERAPY:  Alectinib 661m BID started on 10/21/2017  INTERVAL HISTORY:  Logan JANKOWSKIis here for a follow up. He presents to the clinic alone. He called his wife to be included in the clinic today. He notes he is doing well. He notes his heart is doing well and he feels better since stop eating after 5-6pm. He denies anymore coughing up blood. He continues to f/u with Cardiologist and PCP. He notes he has been doing video conference with his cardiologist.     REVIEW OF SYSTEMS:   Constitutional: Denies fevers, chills or abnormal weight loss Eyes: Denies  blurriness of vision Ears, nose, mouth, throat, and face: Denies mucositis or sore throat Respiratory: Denies cough, dyspnea or wheezes Cardiovascular: Denies palpitation, chest discomfort or lower extremity swelling Gastrointestinal:  Denies nausea, heartburn or change in bowel habits Skin: Denies abnormal skin rashes Lymphatics: Denies new lymphadenopathy or easy bruising Neurological:Denies numbness, tingling or new weaknesses Behavioral/Psych: Mood is stable, no new changes  All other systems were reviewed with the patient and are negative.  MEDICAL HISTORY:  Past Medical History:  Diagnosis Date  . CHF (congestive heart failure) ([place]   . Chronic systolic heart failure ([place] 04/15/2015    EF 30-35% by echo 2016 , [facility]   . Hypertension   . Lung mass 04/15/2015    Never biopsied. History of PET scan that raised concern.   . Metabolic syndrome 27/05/2200 . OSA on CPAP   . Paroxysmal atrial fibrillation ([place] 04/15/2015   On apixaban   . Prostate cancer ([place]   . [place]ocyt[place] ([place] 04/15/2015    SURGICAL HISTORY: Past Surgical History:  Procedure Laterality Date  . CARDIAC CATHETERIZATION N/A 04/27/2015   Procedure: Right/Left Heart Cath and Coronary Angiography;  Surgeon: HBelva Crome MD;  [facility];  Service: Cardiovascular;  Laterality: N/A;  . CARDIOVERSION N/A 09/29/2017   Procedure: CARDIOVERSION;  Surgeon: HPixie Casino MD;  [facility]  Service: Cardiovascular;  Laterality: N/A;  . IR THORACENTESIS ASP PLEURAL SPACE W/IMG GUIDE  09/27/2017  . PROSTATE BIOPSY    . TEE WITHOUT CARDIOVERSION N/A 09/29/2017   Procedure: TRANSESOPHAGEAL ECHOCARDIOGRAM (TEE);  Surgeon: HPixie Casino MD;  [facility];  Service: Cardiovascular;  Laterality: N/A;    I have reviewed the social history and family history with the patient and they are unchanged from previous note.  ALLERGIES:  has No Known Allergies.  MEDICATIONS:  Current  Outpatient Medications  Medication Sig Dispense Refill  . ALECENSA 150 MG capsule TAKE 4 CAPSULES BY MOUTH TWICE A DAY WITH A MEAL 240 capsule 2  . amiodarone (PACERONE) 100 MG tablet Take 1 tablet (100 mg total) by mouth daily. 90 tablet 3  . apixaban (ELIQUIS) 5 MG TABS tablet Take 1 tablet (5 mg total) by mouth 2 (two) times daily. 180 tablet 3  . carvedilol (COREG) 3.125 MG tablet Take 1 tablet (3.125 mg total) by mouth 2 (two) times daily. 180 tablet 1  . Cholecalciferol (CVS VIT D 5000 HIGH-POTENCY) 5000 units capsule Take 5,000 Units by mouth daily.    . digoxin (LANOXIN) 0.125 MG tablet Take 1 tablet (0.125 mg total) by mouth daily. 90 tablet 3  . furosemide (LASIX) 80 MG tablet TAKE 1 TABLET EVERY MORNING 90 tablet 1  . Multiple Vitamin (MULTIVITAMIN) tablet Take 1 tablet by mouth daily.    . potassium chloride SA (K-DUR) 20 MEQ tablet TAKE 1 TABLET TWICE DAILY 90 tablet 0  . sacubitril-valsartan (ENTRESTO) 24-26 MG Take 1 tablet by mouth 2 (two) times daily. 180 tablet 3  . spironolactone (ALDACTONE) 25 MG tablet TAKE 1/2 TABLET EVERY DAY 45 tablet 0  . traZODone (DESYREL) 50 MG tablet Take 1 tablet (50 mg total) by mouth at bedtime as needed for sleep. 20 tablet 0   No current facility-administered medications for this visit.     PHYSICAL EXAMINATION: ECOG PERFORMANCE STATUS: 1 - Symptomatic but completely ambulatory  Vitals:   10/10/18 0812  BP: (!) 97/57  Pulse: 62  Resp: 17  Temp: 97.8 F (36.6 C)  SpO2: 99%   Filed Weights   10/10/18 0812  Weight: 224 lb 12.8 oz (102 kg)    GENERAL:alert, no distress and comfortable SKIN: skin color, texture, turgor are normal, no rashes or significant lesions EYES: normal, Conjunctiva are pink and non-injected, sclera clear  LUNGS: clear to auscultation and percussion with normal breathing effort HEART: regular rate & rhythm and no murmurs and no lower extremity edema ABDOMEN:abdomen soft, non-tender and normal bowel sounds  Musculoskeletal:no cyanosis of digits and no clubbing  NEURO: alert & oriented x 3 with fluent speech, no focal motor/sensory deficits  LABORATORY DATA:  I have reviewed the data as listed CBC Latest Ref Rng & Units 10/03/2018 08/09/2018 05/15/2018  WBC 4.0 - 10.5 K/uL 5.1 5.0 5.0  Hemoglobin 13.0 - 17.0 g/dL 11.7(L) 11.8(L) 11.2(L)  Hematocrit 39.0 - 52.0 % 33.8(L) 34.3(L) 33.2(L)  Platelets 150 - 400 K/uL 159 155 139(L)     CMP Latest Ref Rng & Units 10/03/2018 08/09/2018 05/15/2018  Glucose 70 - 99 mg/dL 94 96 110(H)  BUN 8 - 23 mg/dL 18 20 26(H)  Creatinine 0.61 - 1.24 mg/dL 1.48(H) 1.76(H) 1.71(H)  Sodium 135 - 145 mmol/L 141 137 142  Potassium 3.5 - 5.1 mmol/L 3.9 3.9 4.5  Chloride 98 - 111 mmol/L 105 104 106  CO2 22 - 32 mmol/L _0 Calcium 8.9 - 10.3 mg/dL 9.4 9.1 9.1  Total Protein 6.5 - 8.1 g/dL 7.4 7.0 7.6  Total Bilirubin 0.3 - 1.2 mg/dL 0.4 0.8 0.7  Alkaline Phos 38 - 126 U/L 58 61 71  AST 15 - 41 U/L _1 ALT  0 - 44 U/L _0 RADIOGRAPHIC STUDIES: I have personally reviewed the radiological images as listed and agreed with the findings in the report. No results found.   ASSESSMENT & PLAN:  Logan [name] is a 68 y.o. male with   1. 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 - Due to his advanced heart failure, he is not a good candidate for intensive chemotherapy. -tumor NGS showed ALK translocation (+) - He started Alectinib on 10/21/2017, tolerating very well  -I personally reviewed and discussed his CT CAP from 10/03/18 which shows decrease in right lower lobe lung mass and decrease in liver metastatic lesions. Stable subpleural nodule in RUL. No new or progressive findings. Overall good response to treatment. Will continue.  -I discussed the median PFS  (progression free survival) is about 25 months. When he progress, will change to 3rd generation ALK inhibitor.  -Next scan in 6 months then in 4 months.  -Labs reviewed from last week, CBC and CMP WNL except Hg 11.7, Cr 1.48. Overall adequate to proceed with Alectinib.  -F/u in 3 months    2.History of untreated prostate cancer, with rising PSA -will f/u PSA   3.Bilateral pleural effusion,R>L, likely related to CHF -s/p thoracentesis 7/17 with cytology showing reactive mesothelial cells - stable on f/u CXR. He has minimum effusion on recent CT scan  -he is on lasix   4.Cardiomyopathy with EF 10 to 15%, history of cardiac arrest -TTE/TEEpreviouslyshowed decreased ejection fraction to 10 to 15% -Previously 25 to 30% -Cardiology on board, f/u with Dr. Aundra Dubin and Dr. Tamala Julian  -Currently on digoxin, Lasix, spironolactone and Cozaar - He is not a candidate for advanced cardiac therapies due to his stage IV lung cancer  - F/u with cardiologist   5. Atrial Fibrillation -Was on Xarelto but off due to hemoptysis -Continue aspirin 81 and follow-up with cardiology to decide on antiplatelet versus anticoagulation -On Eliquis 5 mg    6.Incidentalstroke:leftcerebellar smallinfarct -Resultantno neurological focal deficit -MRIpunctate to small left cerebellar infarct -MRAunremarkable -CarotidDopplerunremarkable -Xarelto (rivaroxaban) dailyprior to admission (off due to hemoptysis), now onaspirin 81 mg daily.Ideally, patient should be on anticoagulation however due to severe hemoptysis, hematuria and anemia, patient not anticoagulated candidate at this time. Agree with cardiology plan to continue aspirin 81 and then follow-up as outpatient. -Patient counseled to be compliant withhisantithrombotic medications -Continue ongoing aggressive stroke risk factor management  7. Goal of care discussion  -The patient understands the goal of care is palliative. -He is full  code now   8.CKD stage III -stable, continue monitoring   Plan: -We reviewed lab and his CT CAP, shows good response  -Continue Alectinib -Lab and f/u in  3 months    No problem-specific Assessment & Plan notes found for this encounter.   No orders of the defined types were placed in this encounter.  All questions were answered. The patient knows to call the clinic with any problems, questions or concerns. No barriers to learning was detected. I spent 20 minutes counseling the patient face to face. The total time spent in the appointment was 25 minutes and more than 50% was on counseling and review of test results     Truitt Merle, MD 10/10/2018   I, Joslyn Devon, am acting as scribe for [employer]  Burr Medico, MD.   I have reviewed the above documentation for accuracy and completeness, and I agree with the above.

## 2018-10-10 ENCOUNTER — Encounter: Payer: Self-pay | Admitting: Hematology

## 2018-10-10 ENCOUNTER — Inpatient Hospital Stay (HOSPITAL_BASED_OUTPATIENT_CLINIC_OR_DEPARTMENT_OTHER): Payer: Medicare Other | Admitting: Hematology

## 2018-10-10 ENCOUNTER — Other Ambulatory Visit: Payer: Self-pay

## 2018-10-10 ENCOUNTER — Telehealth: Payer: Self-pay | Admitting: Hematology

## 2018-10-10 VITALS — BP 97/57 | HR 62 | Temp 97.8°F | Resp 17 | Ht 71.0 in | Wt 224.8 lb

## 2018-10-10 DIAGNOSIS — I4891 Unspecified atrial fibrillation: Secondary | ICD-10-CM

## 2018-10-10 DIAGNOSIS — C3491 Malignant neoplasm of unspecified part of right bronchus or lung: Secondary | ICD-10-CM

## 2018-10-10 DIAGNOSIS — C787 Secondary malignant neoplasm of liver and intrahepatic bile duct: Secondary | ICD-10-CM

## 2018-10-10 DIAGNOSIS — Z7901 Long term (current) use of anticoagulants: Secondary | ICD-10-CM

## 2018-10-10 DIAGNOSIS — I428 Other cardiomyopathies: Secondary | ICD-10-CM

## 2018-10-10 DIAGNOSIS — C61 Malignant neoplasm of prostate: Secondary | ICD-10-CM

## 2018-10-10 DIAGNOSIS — N183 Chronic kidney disease, stage 3 (moderate): Secondary | ICD-10-CM

## 2018-10-10 DIAGNOSIS — I480 Paroxysmal atrial fibrillation: Secondary | ICD-10-CM

## 2018-10-10 NOTE — Telephone Encounter (Signed)
Scheduled appt per 7/29 los.  Printed and mailed appt calendar.

## 2018-10-22 ENCOUNTER — Other Ambulatory Visit (HOSPITAL_COMMUNITY): Payer: Self-pay | Admitting: Cardiology

## 2018-11-06 ENCOUNTER — Other Ambulatory Visit (HOSPITAL_COMMUNITY): Payer: Self-pay | Admitting: Student

## 2018-11-08 ENCOUNTER — Ambulatory Visit (HOSPITAL_COMMUNITY)
Admission: RE | Admit: 2018-11-08 | Discharge: 2018-11-08 | Disposition: A | Discharge status: Home / Self Care | Payer: Medicare Other | Source: Ambulatory Visit | Attending: Cardiology | Admitting: Cardiology

## 2018-11-08 ENCOUNTER — Encounter (HOSPITAL_COMMUNITY): Payer: 59 | Admitting: Cardiology

## 2018-11-08 ENCOUNTER — Ambulatory Visit (HOSPITAL_BASED_OUTPATIENT_CLINIC_OR_DEPARTMENT_OTHER)
Admission: RE | Admit: 2018-11-08 | Discharge: 2018-11-08 | Disposition: A | Discharge status: Home / Self Care | Payer: Medicare Other | Source: Ambulatory Visit | Attending: Cardiology | Admitting: Cardiology

## 2018-11-08 ENCOUNTER — Other Ambulatory Visit: Payer: Self-pay

## 2018-11-08 ENCOUNTER — Telehealth (HOSPITAL_COMMUNITY): Payer: Self-pay | Admitting: Pharmacy Technician

## 2018-11-08 ENCOUNTER — Encounter (HOSPITAL_COMMUNITY): Payer: Self-pay | Admitting: Cardiology

## 2018-11-08 VITALS — BP 110/70 | HR 60 | Wt 229.4 lb

## 2018-11-08 DIAGNOSIS — G4733 Obstructive sleep apnea (adult) (pediatric): Secondary | ICD-10-CM | POA: Diagnosis not present

## 2018-11-08 DIAGNOSIS — I5022 Chronic systolic (congestive) heart failure: Secondary | ICD-10-CM

## 2018-11-08 DIAGNOSIS — Z7901 Long term (current) use of anticoagulants: Secondary | ICD-10-CM | POA: Diagnosis not present

## 2018-11-08 DIAGNOSIS — I480 Paroxysmal atrial fibrillation: Secondary | ICD-10-CM | POA: Diagnosis not present

## 2018-11-08 DIAGNOSIS — Z79899 Other long term (current) drug therapy: Secondary | ICD-10-CM | POA: Diagnosis not present

## 2018-11-08 DIAGNOSIS — I5023 Acute on chronic systolic (congestive) heart failure: Secondary | ICD-10-CM | POA: Diagnosis present

## 2018-11-08 DIAGNOSIS — I130 Hypertensive heart and chronic kidney disease with heart failure and stage 1 through stage 4 chronic kidney disease, or unspecified chronic kidney disease: Secondary | ICD-10-CM | POA: Diagnosis not present

## 2018-11-08 DIAGNOSIS — C61 Malignant neoplasm of prostate: Secondary | ICD-10-CM | POA: Diagnosis not present

## 2018-11-08 DIAGNOSIS — I428 Other cardiomyopathies: Secondary | ICD-10-CM | POA: Diagnosis not present

## 2018-11-08 DIAGNOSIS — C787 Secondary malignant neoplasm of liver and intrahepatic bile duct: Secondary | ICD-10-CM | POA: Insufficient documentation

## 2018-11-08 DIAGNOSIS — Z87891 Personal history of nicotine dependence: Secondary | ICD-10-CM | POA: Diagnosis not present

## 2018-11-08 DIAGNOSIS — I5082 Biventricular heart failure: Secondary | ICD-10-CM | POA: Diagnosis not present

## 2018-11-08 DIAGNOSIS — C3490 Malignant neoplasm of unspecified part of unspecified bronchus or lung: Secondary | ICD-10-CM | POA: Insufficient documentation

## 2018-11-08 DIAGNOSIS — N183 Chronic kidney disease, stage 3 (moderate): Secondary | ICD-10-CM | POA: Insufficient documentation

## 2018-11-08 DIAGNOSIS — Z8249 Family history of ischemic heart disease and other diseases of the circulatory system: Secondary | ICD-10-CM | POA: Insufficient documentation

## 2018-11-08 DIAGNOSIS — Z8673 Personal history of transient ischemic attack (TIA), and cerebral infarction without residual deficits: Secondary | ICD-10-CM | POA: Diagnosis not present

## 2018-11-08 LAB — DIGOXIN LEVEL: Digoxin Level: <0.2 ng/mL — ABNORMAL LOW (ref 0.8–2.0)

## 2018-11-08 LAB — COMPREHENSIVE METABOLIC PANEL
ALT: 23 U/L (ref 0–44)
AST: 23 U/L (ref 15–41)
Albumin: 4.2 g/dL (ref 3.5–5.0)
Alkaline Phosphatase: 60 U/L (ref 38–126)
Anion gap: 9 (ref 5–15)
BUN: 21 mg/dL (ref 8–23)
CO2: 24 mmol/L (ref 22–32)
Calcium: 9.2 mg/dL (ref 8.9–10.3)
Chloride: 105 mmol/L (ref 98–111)
Creatinine, Ser: 1.43 mg/dL — ABNORMAL HIGH (ref 0.61–1.24)
GFR calc Af Amer: 58 mL/min — ABNORMAL LOW (ref >60)
GFR calc non Af Amer: 50 mL/min — ABNORMAL LOW (ref >60)
Glucose, Bld: 93 mg/dL (ref 70–99)
Potassium: 4.3 mmol/L (ref 3.5–5.1)
Sodium: 138 mmol/L (ref 135–145)
Total Bilirubin: 0.7 mg/dL (ref 0.3–1.2)
Total Protein: 7 g/dL (ref 6.5–8.1)

## 2018-11-08 MED ORDER — SPIRONOLACTONE 25 MG PO TABS: 25.0000 mg | ORAL_TABLET | Freq: Every day | ORAL | 3 refills | Status: DC

## 2018-11-08 MED ORDER — POTASSIUM CHLORIDE CRYS ER 20 MEQ PO TBCR: 20.0000 meq | EXTENDED_RELEASE_TABLET | Freq: Every day | ORAL | 3 refills | Status: DC

## 2018-11-08 MED ORDER — CARVEDILOL 6.25 MG PO TABS: 6.2500 mg | ORAL_TABLET | Freq: Two times a day (BID) | ORAL | 3 refills | Status: DC

## 2018-11-08 NOTE — Progress Notes (Signed)
Advanced Heart Failure Clinic Note   Referring Physician: PCP: [name], MD PCP-Cardiologist: Sinclair Grooms, MD  HF: Dr Aundra Dubin  HPI: DEARIUS HOFFMANN is a 68 y.o. male  with history of HTN, paroxysmal atrial fibrillation, OSA, untreated prostate cancer 2012 , RLL mass 2015 declined treatment, and chronic systolic heart failure.   Admitted 9/24-04/19/81 with A/C systolic HF. Required thoracentesis 7/17. Echo showed EF 15% with biventricular failure. Course complicated by Afib RVR and associated flash pulmonary edema. Required intubation and pressors. Diuresed with IV lasix. Oncology consulted for lung cancer (RLL) with liver mets. MRI brain was negative for mets, but showed small left cerebellar infarct. He had hemoptysis with heparin, so he was only discharged on ASA 81 mg daily. HF meds were optimized. He was eventually started on apixaban.   He saw Dr Burr Medico and was started on Alectinib, which has potential cardiac toxicity, especially bradycardia.   [facility] with increased dyspnea on 01/02/18. Had PEA arrest and required intubation. LHC with no coronary disease. LVEDP was 40 so he was diuresed with IV lasix and later transitioned to 40 mg po lasix. Discharge weight 221 pound. He was discharged to home  01/06/2018.  Echo in 10/19 showed EF 25-30%.    Echo was done today and reviewed, EF 30-35% with mildly decreased RV systolic function.   He presents today for followup of CHF.  He remains on alectinib for treatment of his metastatic lung cancer, no progression so far. He is feeling quite good symptomatically.  He walks 1 mile/day without dyspnea.  No chest pain.  No orthopnea/PND.  Weight fairly stable.    Labs (3/20): K 4.5, creatinine 1.45 => 1.71, LFTs normal, hgb 11.2 Labs (7/20): K 3.9, creatinine 1.48, LFTs normal, hgb 11.7  PMH: 1. Metastatic non-small cell lung cancer: he is getting alectinib with good response so far.   - Liver mets 2. Prostate  cancer: Untreated.  3. Atrial fibrillation: Paroxysmal.  4. 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  5. Chronic systolic CHF: Nonischemic cardiomyopathy.   - Echo (7/19): EF 15%.  - LHC [facility] 01/02/2018: No significant coronary disease.   - Echo (10/19): EF 25-30%, moderate LV dilation, mild LVH.  - Echo (8/20): EF 41-96%, grade 2 diastolic dysfunction, mildly decreased RV systolic function, normal IVC.  6. HTN 7. CKD: Stage 3  Review of systems complete and found to be negative unless listed in HPI.    Current Outpatient Medications  Medication Sig Dispense Refill  . ALECENSA 150 MG capsule TAKE 4 CAPSULES BY MOUTH TWICE A DAY WITH A MEAL 240 capsule 2  . amiodarone (PACERONE) 100 MG tablet Take 1 tablet (100 mg total) by mouth daily. 90 tablet 3  . carvedilol (COREG) 6.25 MG tablet Take 1 tablet (6.25 mg total) by mouth 2 (two) times daily with a meal. 180 tablet 3  . Cholecalciferol (CVS VIT D 5000 HIGH-POTENCY) 5000 units capsule Take 5,000 Units by mouth 2 (two) times daily.     . digoxin (LANOXIN) 0.125 MG tablet Take 1 tablet (0.125 mg total) by mouth daily. 90 tablet 3  . ELIQUIS 5 MG TABS tablet TAKE ONE TABLET BY MOUTH TWICE A DAY 60 tablet 5  . furosemide (LASIX) 80 MG tablet TAKE 1 TABLET EVERY MORNING 90 tablet 1  . Multiple Vitamin (MULTIVITAMIN) tablet Take 1 tablet by mouth daily.    . potassium chloride SA (K-DUR) 20 MEQ tablet Take 1 tablet (  20 mEq total) by mouth daily. 90 tablet 3  . sacubitril-valsartan (ENTRESTO) 24-26 MG Take 1 tablet by mouth 2 (two) times daily. 180 tablet 3  . spironolactone (ALDACTONE) 25 MG tablet Take 1 tablet (25 mg total) by mouth daily. 90 tablet 3  . traZODone (DESYREL) 50 MG tablet Take 1 tablet (50 mg total) by mouth at bedtime as needed for sleep. 20 tablet 0   No current facility-administered medications for this encounter.    No Known Allergies  Social History   Socioeconomic History  .  Marital status: Married    Spouse name: Not on file  . Number of children: Not on file  . Years of education: Not on file  . Highest education level: Not on file  Occupational History  . Not on file  Social Needs  . Financial resource strain: Not on file  . Food insecurity    Worry: Not on file    Inability: Not on file  . Transportation needs    Medical: Not on file    Non-medical: Not on file  Tobacco Use  . Smoking status: Former Smoker    Years: 6.50    Types: Cigarettes, Cigars, Pipe    Quit date: 03/14/1974    Years since quitting: 44.6  . Smokeless tobacco: Never Used  Substance and Sexual Activity  . Alcohol use: Yes    Alcohol/week: 0.0 standard drinks    Comment: 09/26/2017 "holidays only"  . Drug use: Never  . Sexual activity: Yes  Lifestyle  . Physical activity    Days per week: Not on file    Minutes per session: Not on file  . Stress: Not on file  Relationships  . Social Herbalist on phone: Not on file    Gets together: Not on file    Attends religious service: Not on file    Active member of club or organization: Not on file    Attends meetings of clubs or organizations: Not on file    Relationship status: Not on file  . Intimate partner violence    Fear of current or ex partner: Not on file    Emotionally abused: Not on file    Physically abused: Not on file    Forced sexual activity: Not on file  Other Topics Concern  . Not on file  Social History Narrative  . Not on file   Family History  Problem Relation Age of Onset  . Parkinson's disease Mother   . Hypertension Father    Vitals:   11/08/18 0958  BP: 110/70  Pulse: 60  SpO2: 99%  Weight: 104.1 kg (229 lb 6.4 oz)   Wt Readings from Last 3 Encounters:  11/08/18 104.1 kg (229 lb 6.4 oz)  10/10/18 102 kg (224 lb 12.8 oz)  08/07/18 100.2 kg (221 lb)   PHYSICAL EXAM: General: NAD Neck: No JVD, no thyromegaly or thyroid nodule.  Lungs: Clear to auscultation bilaterally with  normal respiratory effort. CV: Nondisplaced PMI.  Heart regular S1/S2, no S3/S4, no murmur.  No peripheral edema.  No carotid bruit.  Normal pedal pulses.  Abdomen: Soft, nontender, no hepatosplenomegaly, no distention.  Skin: Intact without lesions or rashes.  Neurologic: Alert and oriented x 3.  Psych: Normal affect. Extremities: No clubbing or cyanosis.  HEENT: Normal.   ASSESSMENT & PLAN:  1. Chronic systolic CHF: Long history of nonischemic cardiomyopathy.  He does not have an ICD due to metastatic lung cancer and untreated prostate  cancer.  Echo was done today, EF mildly higher at 30-35%.  NYHA class II symptoms.  He is not volume overloaded on exam.  - Continue lasix 80 mg daily. - Continue digoxin 0.125 daily, check digoxin level.  - Continue Entresto 24/26 mg BID.  - Increase Coreg to 6.25 mg bid.  - Increase spironolactone to 25 mg daily and decrease KCl to 20 daily. BMET today and in 10 days.   -ICD not planned due to metastatic cancer diagnosis though he is doing well on therapy with alectinib.  - Given clinical stability terms of his oncological diagnoses, I think that it would be reasonable to pursue Cardiomems.  We have been working on Biochemist, clinical.  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 Good response so far with alectinib.  - Follows with Dr Burr Medico.   3. Atrial fibrillation: Paroxysmal. He is in NSR today.  - Continue amiodarone 100 mg daily. Check LFTs and TSH today.  He will need regular eye exam.  - Continue Eliquis 5 mg BID.  4. CKD: Stage 3.  - BMET today.  5. Prostate cancer: Untreated.  6. 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  He is on Eliquis.   Followup in 3 months  Loralie Champagne, MD 11/08/18

## 2018-11-08 NOTE — Patient Instructions (Signed)
Labs were done today. We will call you with any ABNORMAL results. No news is good news!  Please follow up with lab work in 10 days.  INCREASE Carvedilol to 6.25 mg (1 tab) twice a day.  INCREASE Spirolactone to 25 mg (1 tab) daily.   DECREASE Potassium to 20 mEq (1 tab) daily.  Benjamine Mola, our pharmacy tech, will assist you with Patient Assistance for your medications.   Your physician recommends that you schedule a follow-up appointment in: 3 months.  At the [facility], you and your health needs are our priority. As part of our continuing mission to provide you with exceptional heart care, we have created designated Provider Care Teams. These Care Teams include your primary Cardiologist (physician) and Advanced Practice Providers (APPs- Physician Assistants and Nurse Practitioners) who all work together to provide you with the care you need, when you need it.   You may see any of the following providers on your designated Care Team at your next follow up: Marland Kitchen Dr Glori Bickers . Dr Loralie Champagne . Darrick Grinder, NP   Please be sure to bring in all your medications bottles to every appointment.

## 2018-11-08 NOTE — Telephone Encounter (Signed)
Received notification that patient needs assistance with Eliquis and Entresto co-pays.   Spoke with patient and completed application to [employer] (Eliquis) and [employer] Electrical engineer). Will get provider to sign and will fax both forms off and update patient as information is available.   Had no Entresto samples to give, rep has been contacted.   Gave a one week supply of Eliquis 5 mg.  Exp: 06/2020  Will continue to follow up.  Charlann Boxer, CPhT

## 2018-11-08 NOTE — Progress Notes (Signed)
  Echocardiogram 2D Echocardiogram has been performed.  Logan [name] 11/08/2018, 10:00 AM

## 2018-11-13 ENCOUNTER — Telehealth (HOSPITAL_COMMUNITY): Payer: Self-pay | Admitting: Pharmacy Technician

## 2018-11-13 MED ORDER — ENTRESTO 24-26 MG PO TABS: 1.0000 | ORAL_TABLET | Freq: Two times a day (BID) | ORAL | 3 refills | Status: DC

## 2018-11-13 NOTE — Telephone Encounter (Signed)
Patient was approved to receive Entresto through [employer].  Phone Number: [phone]  ID: [id number]  Approval Dates: 11/12/2018 through 11/12/2019   Called patient to inform him of approval. He seemed pleased. Also gave him the phone number to [employer] so that he could call and set up his first shipment. Advised him to call me with any issues.  We are still waiting to hear on Eliquis, assured patient I would call him back with that information as well.  Charlann Boxer, CPhT

## 2018-11-16 ENCOUNTER — Telehealth (HOSPITAL_COMMUNITY): Payer: Self-pay | Admitting: Pharmacy Technician

## 2018-11-16 NOTE — Telephone Encounter (Addendum)
Checked into status of Eliquis application through [employer]. They are asking to see his Medicare A & B card or to be given the numbers off the card. I do not have this information. Foundation stated they would reach out to the patient directly to ask him.  Called patient and left message regarding Medicare card.  [employer] Phone Number: [phone]  Charlann Boxer, CPhT

## 2018-11-20 ENCOUNTER — Other Ambulatory Visit: Payer: Self-pay

## 2018-11-20 ENCOUNTER — Ambulatory Visit (HOSPITAL_COMMUNITY)
Admission: RE | Admit: 2018-11-20 | Discharge: 2018-11-20 | Disposition: A | Discharge status: Home / Self Care | Payer: Medicare Other | Source: Ambulatory Visit | Attending: Internal Medicine | Admitting: Internal Medicine

## 2018-11-20 DIAGNOSIS — I5022 Chronic systolic (congestive) heart failure: Secondary | ICD-10-CM | POA: Diagnosis not present

## 2018-11-20 LAB — BASIC METABOLIC PANEL
Anion gap: 10 (ref 5–15)
BUN: 18 mg/dL (ref 8–23)
CO2: 27 mmol/L (ref 22–32)
Calcium: 9.2 mg/dL (ref 8.9–10.3)
Chloride: 101 mmol/L (ref 98–111)
Creatinine, Ser: 1.47 mg/dL — ABNORMAL HIGH (ref 0.61–1.24)
GFR calc Af Amer: 56 mL/min — ABNORMAL LOW (ref >60)
GFR calc non Af Amer: 48 mL/min — ABNORMAL LOW (ref >60)
Glucose, Bld: 95 mg/dL (ref 70–99)
Potassium: 3.9 mmol/L (ref 3.5–5.1)
Sodium: 138 mmol/L (ref 135–145)

## 2018-11-29 ENCOUNTER — Other Ambulatory Visit (HOSPITAL_COMMUNITY): Payer: Self-pay | Admitting: Cardiology

## 2018-11-30 ENCOUNTER — Telehealth (HOSPITAL_COMMUNITY): Payer: Self-pay | Admitting: *Deleted

## 2018-11-30 NOTE — Telephone Encounter (Signed)
pts wife called requesting call from [facility] regarding Eliquis patient assistance.  Routed to [employer]

## 2018-11-30 NOTE — Telephone Encounter (Signed)
Spoke with Mrs. Bralley and she is going to send his Medicare card over and I will send that to [employer]. Patient requested that I call his wife with updates at [phone].  Will follow.  Charlann Boxer, CPhT

## 2018-11-30 NOTE — Telephone Encounter (Signed)
Faxed over Medicare card to [employer].  Will continue to follow.  Charlann Boxer, CPhT

## 2018-12-05 NOTE — Telephone Encounter (Signed)
Called [employer] to check the status of his application, they stated they need 7-10 days after we faxed the card over to determine approval. Will call back.  Charlann Boxer, CPhT

## 2018-12-10 NOTE — Telephone Encounter (Addendum)
Spoke with [employer], they need to see the patient's OOP expense report , patient needs to spend $1,129.22 based off the income provided. Called and spoke with wife, who is going to get OOP from [facility] and [place] along with Mrs. Kendall's retirement letter stating that their income has changed. When all that is faxed over, I will fax that information to [employer].  Will continue to follow.  Charlann Boxer, CPhT

## 2018-12-19 NOTE — Telephone Encounter (Addendum)
Called Logan [name] to see if she was able to send in income to me or [employer]. She called back and left a message stating that she sent in some paperwork to [employer] and was just waiting on a return call from then. Called [employer] and they stated that she sent in his SS letter but not hers. Would need to send in OOP expense report for both he and her so that way the medication she has bought OOP would count towards the $ they want him to meet for the year as well. Representative stated that we should put the case ID # on the fac that we send as well. ID is [id number].  Called and left her a message providing this information and asked for a return call.  Will follow up.  Charlann Boxer, CPhT

## 2018-12-25 NOTE — Telephone Encounter (Signed)
Patient coming to sign appeal form for [employer] and give supporting documents showing his change of income. Will send to [employer] after he comes in.   Provided three weeks worth of Eliquis samples while we work on appealing to [place].  Lot (2 bottles): [id number]  Exp: 02/2021  Lot (1bottle): [id number]  Exp: 09/2020  Will continue to follow.  Charlann Boxer, CPhT

## 2018-12-27 ENCOUNTER — Other Ambulatory Visit (HOSPITAL_COMMUNITY): Payer: Self-pay | Admitting: Cardiology

## 2018-12-29 ENCOUNTER — Other Ambulatory Visit (HOSPITAL_COMMUNITY): Payer: Self-pay | Admitting: Cardiology

## 2019-01-03 NOTE — Telephone Encounter (Signed)
[employer]MS did receive the new information showing their income and retirement letter for both patient and wife. Representative stated that we would need to send her social security or state that she has no income. Will call patient and get clarification.  Charlann Boxer, CPhT

## 2019-01-08 NOTE — Telephone Encounter (Signed)
Spoke to [employer], they have yet to calculate the new OOP expense amount that the patient would need to spend in order to be approved. I spoke with a Freight forwarder who is getting this updated and said that they would reach out to the patient with the new amount. Called and spoke with [place] (patients wife) who is going to send me over the little OOP expense that they have spent this year and I will send that in as well.  Will follow up.  Charlann Boxer, CPhT

## 2019-01-10 ENCOUNTER — Inpatient Hospital Stay: Payer: Medicare Other | Admitting: Hematology

## 2019-01-10 ENCOUNTER — Inpatient Hospital Stay: Payer: Medicare Other | Attending: Hematology

## 2019-01-11 NOTE — Telephone Encounter (Signed)
Advanced Heart Failure Patient Advocate Encounter   Patient was approved to receive Eliquis from [employer].  Patient ID: [id number] Effective dates: 01/08/2019 through 03/14/2019  Charlann Boxer, CPhT

## 2019-02-27 ENCOUNTER — Telehealth (HOSPITAL_COMMUNITY): Payer: Self-pay | Admitting: Pharmacy Technician

## 2019-02-27 NOTE — Telephone Encounter (Signed)
Spoke to patient's wife regarding re-enrollment of Delene Loll and Eliquis in [employer] and [employer]. Emailed the application for the patient to sign. Patient is aware to include POI and that the foundation will tell us exactly how much will need to be spent OOP next year in order to qualify for Eliquis. [name] is going to send the patient portion of Entresto in herself, I will send the provider portion and Eliquis application in for them.  Will follow up.  Charlann Boxer, CPhT

## 2019-03-11 NOTE — Telephone Encounter (Signed)
Sent in applications to both [employer] and [employer].   Will continue to follow.  Charlann Boxer, CPhT

## 2019-03-13 ENCOUNTER — Telehealth: Payer: Self-pay

## 2019-03-13 DIAGNOSIS — C3491 Malignant neoplasm of unspecified part of right bronchus or lung: Secondary | ICD-10-CM

## 2019-03-13 MED ORDER — ALECENSA 150 MG PO CAPS: ORAL_CAPSULE | ORAL | 2 refills | Status: DC

## 2019-03-13 NOTE — Telephone Encounter (Signed)
Patient's wife calls stating need refill on Alecensa.  This was sent into [facility] as requested.  Explained we do need to see him sometime end of January or February for lab and follow up.  She verbalized an understanding.  I have sent a scheduling message.

## 2019-03-14 ENCOUNTER — Telehealth: Payer: Self-pay | Admitting: Hematology

## 2019-03-14 NOTE — Telephone Encounter (Signed)
Scheduled appt per 12/30 sch message - pt is aware of appt date and time   

## 2019-03-18 ENCOUNTER — Other Ambulatory Visit: Payer: Self-pay | Admitting: Hematology

## 2019-03-18 DIAGNOSIS — C3491 Malignant neoplasm of unspecified part of right bronchus or lung: Secondary | ICD-10-CM

## 2019-03-18 NOTE — Telephone Encounter (Signed)
He missed his appointment 2-3 months ago. He would need restaging scan a few days before his next OV. I will place the scan orders and send a schedule message. Thanks   Truitt Merle MD

## 2019-03-19 ENCOUNTER — Telehealth: Payer: Self-pay

## 2019-03-19 NOTE — Telephone Encounter (Signed)
-----   Message from Truitt Merle, MD sent at 03/18/2019  5:46 PM EST ----- I ordered CT CAP wo contrast to be done before his next OV on 2/10. He is not compliant, please get it scheduled when it's approved, and let his wife know.  Thanks   Krista Blue

## 2019-03-19 NOTE — Telephone Encounter (Signed)
I spokew with Mr Gebhardt's wife [name] today.  I told her his CT scan is scheduled 04/19/2019 at 1400 with lab appt at [facility] at 1300.  I told her he should arrive at 1345 to [facility] radiology.  He needs to be NPO after 1000 that day.  I told her she will need to pickup oral contrast.  He should drink the contrast 1 bottle at 1200 and the second at 1300.  She verbalized understanding.  Written instructions mailed to their home.

## 2019-04-02 ENCOUNTER — Other Ambulatory Visit (HOSPITAL_COMMUNITY): Payer: Self-pay | Admitting: Adult Health

## 2019-04-02 NOTE — Telephone Encounter (Signed)
Called BMS and patient's wife for status update. [employer]MS wants him to spend 945.48 OOP before they approve him to get Eliquis through their foundation. Last year we sent in a social security statement and a letter from his wife's previous employer stating that she was no longer working.  Representative told me we could send that in and then to call and confirm that they received it so that we could get a redetermination. Patient's wife will email that to me and I will get it sent in on their behalf.  Will follow up.  Charlann Boxer, CPhT

## 2019-04-08 ENCOUNTER — Encounter: Payer: Self-pay | Admitting: Hematology

## 2019-04-15 NOTE — Telephone Encounter (Signed)
Spoke with patient's wife, its too early to renew [employer] assistance (good through 11/12/19). Explained that [employer] is requiring a new OOP expense but would not give me the exact number.  She is going to reach out to [employer] to see what needs to be done and then get back to me. We will make a plan for Eliquis after that.  Charlann Boxer, CPhT

## 2019-04-19 ENCOUNTER — Inpatient Hospital Stay: Payer: Medicare Other

## 2019-04-19 ENCOUNTER — Ambulatory Visit (HOSPITAL_COMMUNITY): Admission: RE | Admit: 2019-04-19 | Payer: Medicare Other | Source: Ambulatory Visit

## 2019-04-24 ENCOUNTER — Other Ambulatory Visit: Payer: Medicare Other

## 2019-04-24 ENCOUNTER — Ambulatory Visit: Payer: Medicare Other | Admitting: Hematology

## 2019-04-24 ENCOUNTER — Telehealth (HOSPITAL_COMMUNITY): Payer: Self-pay | Admitting: Cardiology

## 2019-04-24 NOTE — Telephone Encounter (Signed)
cardiomems application faxed [phone] Case was denied Appeal was also denied

## 2019-04-25 ENCOUNTER — Telehealth: Payer: Self-pay

## 2019-04-25 NOTE — Telephone Encounter (Signed)
Logan [name] called stating that her insurance co has informed them the alecensa will now cost $800 per month.  I told her I would contact our oral chemo dept to see if we can assist.

## 2019-04-26 ENCOUNTER — Other Ambulatory Visit: Payer: Self-pay

## 2019-04-26 DIAGNOSIS — C3491 Malignant neoplasm of unspecified part of right bronchus or lung: Secondary | ICD-10-CM

## 2019-04-26 MED ORDER — ALECENSA 150 MG PO CAPS: ORAL_CAPSULE | ORAL | 2 refills | Status: DC

## 2019-04-26 NOTE — Telephone Encounter (Signed)
Rs sent to [facility] for refill per Wynn Maudlin.

## 2019-05-15 ENCOUNTER — Other Ambulatory Visit (HOSPITAL_COMMUNITY): Payer: Self-pay | Admitting: Cardiology

## 2019-05-21 NOTE — Progress Notes (Signed)
[place]   Telephone:(336) [phone] [phone]) [phone]   Clinic Follow up Note   Patient Care Team: [name], MD as PCP - General (Family Medicine) [name], MD as PCP - Cardiology (Cardiology) [name], MD as PCP - Advanced Heart Failure (Cardiology) [name], MD as Consulting Physician (Cardiology)  Date of Service:  05/29/2019  CHIEF COMPLAINT: F/u on metastatic lung adenocarcinoma, ALK(+)  SUMMARY OF ONCOLOGIC HISTORY: Oncology History Overview Note  Cancer Staging Non-small cell lung cancer (NSCLC) ([place]) Staging form: Lung, AJCC 8th Edition - Clinical stage from 10/02/2017: Stage IV (cT3, cN2, cM1c) - Signed by Truitt Merle, MD on 10/20/2017    Non-small cell lung cancer (NSCLC) ([place])  04/01/2013 Imaging   04/01/2013 CXR IMPRESSION: 1. Cardiomegaly with diffuse increased interstitial markings consistent with pulmonary edema. 2. 5-6 cm rounded density seen in the right lower lobe medially. This could be related to an area of consolidation correlate clinically for pneumonia.. But the rounded and well-defined borders are suspicious for mass. Followup x-ray suggested after  treating for the pulmonary edema/pneumonia to see if this resolves.   04/30/2014 Imaging   04/30/2014 CXR IMPRESSION: Enlarging right lower lobe nodule. Would recommend PET CT to further evaluate.   01/20/2015 Imaging   01/20/2015 CXR IMPRESSION: Significant improvement comparatively, now with smaller pleural effusions and resolving pulmonary edema. Known right lower lobe mass suspicious for bronchogenic carcinoma is redemonstrated.   04/07/2015 PET scan   04/07/2015 PET Scan IMPRESSION:  Hypermetabolic right lower lobe mass Hypermetabolic left adrenal nodule.   08/21/2015 Imaging   08/21/2015 CXR IMPRESSION: Right lower lobe mass.   09/22/2017 Imaging   09/22/2017 CXR IMPRESSION: 1.Moderate right pleural effusion with right lower/middle lobe consolidation. Minimal  patchy opacities in left lower lobe. 2.Cardiomegaly.   09/26/2017 Imaging   09/26/2017 CT Chest WO Contrast IMPRESSION: 1. Large RIGHT lower lobe mass with central and peripheral calcifications occupies the near entirety of the RIGHT lower lobe and concerning for neoplasm. 2. Moderate RIGHT effusion. 3. Mild mediastinal and RIGHT hilar adenopathy. 4. Recommend FDG PET scan for biopsy planning/staging   09/26/2017 Tumor Marker    Ref. Range 09/26/2017 21:31  Prostatic Specific Antigen Latest Ref Range: 0.00 - 4.00 ng/mL 84.83 (H)      09/27/2017 Imaging   09/27/2017 CT AP W WO Contrast IMPRESSION: 1. Large complex masses in the right lower lobe of the lung and in the right hepatic lobe. Smaller liver lesions in the left hepatic lobe. Nonspecific lesions in the spleen and kidneys. No significant pathologic adenopathy. This would not be a characteristic metastatic pattern for prostate cancer and is not a specific metastatic and imaging pattern to suggest an individual etiology. Possibilities might include metastatic lung cancer, melanoma, metastatic hepatocellular carcinoma, or a variety of other possibilities. Tissue diagnosis recommended. 2. Moderate prostatomegaly. 3. Scattered mild ascites. 4. Moderate cardiomegaly.  Small inferior pericardial effusion. 5. Trace bilateral pleural effusions.   10/02/2017 Pathology Results   10/02/2017 Surgical Pathology Diagnosis Liver, needle/core biopsy, Left Hepatic Lobe - METASTATIC LUNG ADENOCARCINOMA TO THE LIVER. SEE NOTE   10/02/2017 Cancer Staging   Staging form: Lung, AJCC 8th Edition - Clinical stage from 10/02/2017: Stage IV (cT3, cN2, cM1c) - Signed by Truitt Merle, MD on 10/20/2017   10/04/2017 Initial Diagnosis   Non-small cell lung cancer (NSCLC) ([place])   10/21/2017 -  Antibody Plan   Alectinib '600mg'$  bid started on 10/21/2017   01/10/2018 PET scan   01/10/2018 PET Scan IMPRESSION:  1. Marked reduction in size of the right lower  lobe mass with fairly low metabolic activity in the residual mass, maximum SUV 5.3. Notably reduced thoracic adenopathy which is not currently hypermetabolic. 2. Marked reduction in size and conspicuity of the hepatic tumors, both of which have metabolic activity similar to that of the background liver parenchyma. 3. Resolution of the splenic mass. No additional or new lesions are identified. 4. Accentuated activity anteriorly in the left seventh rib, low-grade with maximum SUV 3.5, without appreciable CT abnormality on the CT data, significance uncertain. 5. Accentuated activity at the right sternoclavicular joint, SUV 7.2, probably from arthropathy. 6. Accentuated activity in the posterior and apical portions of the prostate gland, significance uncertain. 7. Other imaging findings of potential clinical significance: Chronic left maxillary sinusitis.   10/03/2018 Imaging   CT CAP W contrast 10/03/18 IMPRESSION: 1. No new or progressive findings on today's study. 2. Continued further slight reduction in size of the right lower mass. No lymphadenopathy in the chest. 3. Continued further decrease in size of liver lesions with a left hepatic lesion noted previously not apparent on today's study. 4. Stable 6 mm ill-defined subpleural nodule right upper lobe. Continued attention on follow-up recommended.   05/22/2019 Imaging   CT CAP WO Contrast  IMPRESSION: 1. Unchanged post treatment consolidation and calcification of the anterior right lower lobe. 2. Unchanged capsular retraction of the anterior liver dome, a hypodense mass at this site on prior examinations not discretely appreciated. 3. No noncontrast evidence of new intrathoracic or intra-abdominal metastatic disease. 4. Unchanged subpleural ground-glass nodule of the right upper lobe measuring 6 mm. An indolent lung malignancy is not excluded. Continued attention on follow-up. 5. Prostatomegaly.        CURRENT THERAPY:   Alectinib '600mg'$ BIDstarted on 10/21/2017  INTERVAL HISTORY:  Logan [name] is here for a follow up. He was last seen by me 8 months ago. He presents to the clinic with his wife. He notes he is doing well. He has received both his COVID19 vaccinations. He notes his heart function is doing better. He last saw his cardiologist 1 year ago. He denies any new changes in medication notes his BP can be on the low side recently. He notes he is still taking Alectinib '600mg'$  BID and tolerating well. He no longer has bleeding, denies abdominal issues. He is able to manage his constipation from medication well.     REVIEW OF SYSTEMS:   Constitutional: Denies fevers, chills or abnormal weight loss Eyes: Denies blurriness of vision Ears, nose, mouth, throat, and face: Denies mucositis or sore throat Respiratory: Denies cough, dyspnea or wheezes Cardiovascular: Denies palpitation, chest discomfort or lower extremity swelling Gastrointestinal:  Denies nausea, heartburn (+) Constipation Skin: Denies abnormal skin rashes Lymphatics: Denies new lymphadenopathy or easy bruising Neurological:Denies numbness, tingling or new weaknesses Behavioral/Psych: Mood is stable, no new changes  All other systems were reviewed with the patient and are negative.  MEDICAL HISTORY:  Past Medical History:  Diagnosis Date  . CHF (congestive heart failure) ([place])   . Chronic systolic heart failure ([place]) 04/15/2015    EF 30-35% by echo 2016 , [facility]   . Hypertension   . Lung mass 04/15/2015    Never biopsied. History of PET scan that raised concern.   . Metabolic syndrome 03/22/6220  . OSA on CPAP   . Paroxysmal atrial fibrillation ([place]) 04/15/2015   On apixaban   . Prostate cancer ([place])   . Thrombocytopenia ([place]) 04/15/2015  SURGICAL HISTORY: Past Surgical History:  Procedure Laterality Date  . CARDIAC CATHETERIZATION N/A 04/27/2015   Procedure: Right/Left Heart Cath and Coronary Angiography;  Surgeon: [name], MD;  Location: [facility];  Service: Cardiovascular;  Laterality: N/A;  . CARDIOVERSION N/A 09/29/2017   Procedure: CARDIOVERSION;  Surgeon: Pixie Casino, MD;  Location: [facility];  Service: Cardiovascular;  Laterality: N/A;  . IR THORACENTESIS ASP PLEURAL SPACE W/IMG GUIDE  09/27/2017  . PROSTATE BIOPSY    . TEE WITHOUT CARDIOVERSION N/A 09/29/2017   Procedure: TRANSESOPHAGEAL ECHOCARDIOGRAM (TEE);  Surgeon: Pixie Casino, MD;  Location: [facility] ENDOSCOPY;  Service: Cardiovascular;  Laterality: N/A;    I have reviewed the social history and family history with the patient and they are unchanged from previous note.  ALLERGIES:  has No Known Allergies.  MEDICATIONS:  Current Outpatient Medications  Medication Sig Dispense Refill  . alectinib (ALECENSA) 150 MG capsule TAKE 4 CAPSULES BY MOUTH TWICE A DAY WITH A MEAL 240 capsule 2  . carvedilol (COREG) 6.25 MG tablet Take 1 tablet (6.25 mg total) by mouth 2 (two) times daily with a meal. 180 tablet 3  . Cholecalciferol (CVS VIT D 5000 HIGH-POTENCY) 5000 units capsule Take 5,000 Units by mouth 2 (two) times daily.     . digoxin (LANOXIN) 0.125 MG tablet Take 1 tablet (0.125 mg total) by mouth daily. 90 tablet 3  . ELIQUIS 5 MG TABS tablet TAKE ONE TABLET BY MOUTH TWICE A DAY 60 tablet 5  . furosemide (LASIX) 80 MG tablet TAKE 1 TABLET EVERY MORNING 90 tablet 1  . Multiple Vitamin (MULTIVITAMIN) tablet Take 1 tablet by mouth daily.    Marland Kitchen PACERONE 100 MG tablet TAKE ONE TABLET BY MOUTH DAILY 30 tablet 5  . potassium chloride SA (KLOR-CON) 20 MEQ tablet TAKE 1 TABLET TWICE DAILY 180 tablet 0  . sacubitril-valsartan (ENTRESTO) 24-26 MG Take 1 tablet by mouth 2 (two) times daily. 180 tablet 3  . spironolactone (ALDACTONE) 25 MG tablet TAKE 1/2 TABLET EVERY DAY 45 tablet 3  . traZODone (DESYREL) 50 MG tablet Take 1 tablet (50 mg total) by mouth at bedtime as needed for sleep. 20 tablet 0   No current facility-administered  medications for this visit.    PHYSICAL EXAMINATION: ECOG PERFORMANCE STATUS: 1 - Symptomatic but completely ambulatory  Vitals:   05/29/19 1410  BP: (!) 94/52  Pulse: 62  Resp: 18  Temp: 98.2 F (36.8 C)  SpO2: 100%   Filed Weights   05/29/19 1410  Weight: 237 lb (107.5 kg)    Due to COVID19 we will limit examination to appearance. Patient had no complaints.  GENERAL:alert, no distress and comfortable SKIN: skin color normal, no rashes or significant lesions EYES: normal, Conjunctiva are pink and non-injected, sclera clear  NEURO: alert & oriented x 3 with fluent speech   LABORATORY DATA:  I have reviewed the data as listed CBC Latest Ref Rng & Units 05/22/2019 10/03/2018 08/09/2018  WBC 4.0 - 10.5 K/uL 6.0 5.1 5.0  Hemoglobin 13.0 - 17.0 g/dL 12.0(L) 11.7(L) 11.8(L)  Hematocrit 39.0 - 52.0 % 35.2(L) 33.8(L) 34.3(L)  Platelets 150 - 400 K/uL 150 159 155     CMP Latest Ref Rng & Units 05/22/2019 11/20/2018 11/08/2018  Glucose 70 - 99 mg/dL 94 95 93  BUN 8 - 23 mg/dL 29(H) 18 21  Creatinine 0.61 - 1.24 mg/dL 1.64(H) 1.47(H) 1.43(H)  Sodium 135 - 145 mmol/L 139 138 138  Potassium 3.5 -  5.1 mmol/L 4.5 3.9 4.3  Chloride 98 - 111 mmol/L 103 101 105  CO2 22 - 32 mmol/L '27 27 24  '$ Calcium 8.9 - 10.3 mg/dL 9.0 9.2 9.2  Total Protein 6.5 - 8.1 g/dL 7.6 - 7.0  Total Bilirubin 0.3 - 1.2 mg/dL 0.5 - 0.7  Alkaline Phos 38 - 126 U/L 55 - 60  AST 15 - 41 U/L 23 - 23  ALT 0 - 44 U/L 18 - 23      RADIOGRAPHIC STUDIES: I have personally reviewed the radiological images as listed and agreed with the findings in the report. No results found.   ASSESSMENT & PLAN:  TIMO HARTWIG is a 69 y.o. male with    1. 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 -Due to his advanced heart failure, he isnota  good candidate for intensive chemotherapy. -tumor NGS showed ALK translocation (+) -He started Alectinib on 10/21/2017, tolerating very wellwith manageable constipation  -I personally reviewed his CT CAP from 05/22/19 which shows stable right lung lesions. No evidence of new metastatic disease. Will repeat scan in 4-5 months to watch more closely.  -He is clinically doing well. He continues to tolerate Alectinib well. His heart function is doing much better. Labs reviewed, CBC and CMP WNL except Hg 12, Cr 1.64.  -Continue Alectinib '600mg'$  BID.  -f/u in 3 months. I encouraged him to watch for concerning symptoms.  -He has received both his COVID19 vaccines.    2.History of untreated prostate cancer, with rising PSA -will f/u PSA -Prostatomegaly seen on CT CAP from 05/22/19   3.Bilateral pleural effusion,R>L, likely related to CHF -s/p thoracentesis 7/17 with cytology showing reactive mesothelial cells - stable on f/u CXR. He has minimum effusion on recent CT scan  -he is on lasix.   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 Morton Amy Dr. Tamala Julian -Currently on digoxin, Lasix, spironolactone and Cozaar -He is not a candidate for advanced cardiac therapiesdue to his stage IV lung cancer -F/u with cardiologist -He notes he BP has been on the normal to low side recently. He should f/u with Cardiologist about possible medication change.    5. Atrial Fibrillation -Was on Xarelto but off due to hemoptysis -Continue aspirin 81 and follow-up with cardiology to decide on antiplatelet versus anticoagulation -OnEliquis 5 mg  6.Incidentalstroke:leftcerebellar smallinfarct -Resultantno neurological focal deficit -MRIpunctate to small left cerebellar infarct. MRAunremarkable. CarotidDopplerunremarkable -Xarelto (rivaroxaban)  dailyprior to admission (off due to hemoptysis), now onaspirin 81 mg daily. -Ideally, patient should be on anticoagulation however due to severe hemoptysis, hematuria and anemia, patient not anticoagulated candidate at this time. Agree with cardiology plan to continue aspirin 81 and then follow-up as outpatient.  7. Goal of care discussion  -The patient understands the goal of care is palliative. -He is full code now   8.CKD stage III -stable, continue monitoring -His Cr is elevated at 1.64 (05/29/19). This is likely related to Lasix use. I encouraged him to drink more water and f/u with Cardiologists about adjusting medications.    Plan: -We reviewed lab and his CT CAP, stable disease  -Continue Alectinib'600mg'$  BID, she has grant for copay until July   -Lab and f/u in 3 months  -I  encourage him to F/u with Cardiologist    No problem-specific Assessment & Plan notes found for this encounter.   No orders of the defined types were placed in this encounter.  All questions were answered. The patient knows to call the clinic with any problems, questions or concerns. No barriers to learning was detected. The total time spent in the appointment was 30 minutes.     Truitt Merle, MD 05/29/2019   I, Logan [name], am acting as scribe for Truitt Merle, MD.   I have reviewed the above documentation for accuracy and completeness, and I agree with the above.

## 2019-05-22 ENCOUNTER — Inpatient Hospital Stay: Payer: Medicare Other | Attending: Hematology

## 2019-05-22 ENCOUNTER — Ambulatory Visit (HOSPITAL_COMMUNITY)
Admission: RE | Admit: 2019-05-22 | Discharge: 2019-05-22 | Disposition: A | Discharge status: Home / Self Care | Payer: Medicare Other | Source: Ambulatory Visit | Attending: Hematology | Admitting: Hematology

## 2019-05-22 ENCOUNTER — Other Ambulatory Visit: Payer: Self-pay

## 2019-05-22 ENCOUNTER — Encounter (HOSPITAL_COMMUNITY): Payer: Self-pay

## 2019-05-22 DIAGNOSIS — Z79899 Other long term (current) drug therapy: Secondary | ICD-10-CM | POA: Diagnosis not present

## 2019-05-22 DIAGNOSIS — C3491 Malignant neoplasm of unspecified part of right bronchus or lung: Secondary | ICD-10-CM | POA: Diagnosis not present

## 2019-05-22 DIAGNOSIS — C3490 Malignant neoplasm of unspecified part of unspecified bronchus or lung: Secondary | ICD-10-CM | POA: Diagnosis not present

## 2019-05-22 DIAGNOSIS — C61 Malignant neoplasm of prostate: Secondary | ICD-10-CM | POA: Insufficient documentation

## 2019-05-22 DIAGNOSIS — I4891 Unspecified atrial fibrillation: Secondary | ICD-10-CM | POA: Diagnosis not present

## 2019-05-22 DIAGNOSIS — C787 Secondary malignant neoplasm of liver and intrahepatic bile duct: Secondary | ICD-10-CM | POA: Insufficient documentation

## 2019-05-22 DIAGNOSIS — N183 Chronic kidney disease, stage 3 unspecified: Secondary | ICD-10-CM | POA: Diagnosis not present

## 2019-05-22 DIAGNOSIS — Z7901 Long term (current) use of anticoagulants: Secondary | ICD-10-CM | POA: Diagnosis not present

## 2019-05-22 DIAGNOSIS — I429 Cardiomyopathy, unspecified: Secondary | ICD-10-CM | POA: Diagnosis not present

## 2019-05-22 DIAGNOSIS — I130 Hypertensive heart and chronic kidney disease with heart failure and stage 1 through stage 4 chronic kidney disease, or unspecified chronic kidney disease: Secondary | ICD-10-CM | POA: Insufficient documentation

## 2019-05-22 LAB — CBC WITH DIFFERENTIAL (CANCER CENTER ONLY)
Abs Immature Granulocytes: 0.05 10*3/uL (ref 0.00–0.07)
Basophils Absolute: 0.1 10*3/uL (ref 0.0–0.1)
Basophils Relative: 1 %
Eosinophils Absolute: 0.1 10*3/uL (ref 0.0–0.5)
Eosinophils Relative: 2 %
HCT: 35.2 % — ABNORMAL LOW (ref 39.0–52.0)
Hemoglobin: 12 g/dL — ABNORMAL LOW (ref 13.0–17.0)
Immature Granulocytes: 1 %
Lymphocytes Relative: 50 %
Lymphs Abs: 3 10*3/uL (ref 0.7–4.0)
MCH: 31.3 pg (ref 26.0–34.0)
MCHC: 34.1 g/dL (ref 30.0–36.0)
MCV: 91.7 fL (ref 80.0–100.0)
Monocytes Absolute: 0.5 10*3/uL (ref 0.1–1.0)
Monocytes Relative: 8 %
Neutro Abs: 2.3 10*3/uL (ref 1.7–7.7)
Neutrophils Relative %: 38 %
Platelet Count: 150 10*3/uL (ref 150–400)
RBC: 3.84 MIL/uL — ABNORMAL LOW (ref 4.22–5.81)
RDW: 12.5 % (ref 11.5–15.5)
WBC Count: 6 10*3/uL (ref 4.0–10.5)
nRBC: 0 % (ref 0.0–0.2)

## 2019-05-22 LAB — CMP (CANCER CENTER ONLY)
ALT: 18 U/L (ref 0–44)
AST: 23 U/L (ref 15–41)
Albumin: 4.3 g/dL (ref 3.5–5.0)
Alkaline Phosphatase: 55 U/L (ref 38–126)
Anion gap: 9 (ref 5–15)
BUN: 29 mg/dL — ABNORMAL HIGH (ref 8–23)
CO2: 27 mmol/L (ref 22–32)
Calcium: 9 mg/dL (ref 8.9–10.3)
Chloride: 103 mmol/L (ref 98–111)
Creatinine: 1.64 mg/dL — ABNORMAL HIGH (ref 0.61–1.24)
GFR, Est AFR Am: 49 mL/min — ABNORMAL LOW (ref >60)
GFR, Estimated: 42 mL/min — ABNORMAL LOW (ref >60)
Glucose, Bld: 94 mg/dL (ref 70–99)
Potassium: 4.5 mmol/L (ref 3.5–5.1)
Sodium: 139 mmol/L (ref 135–145)
Total Bilirubin: 0.5 mg/dL (ref 0.3–1.2)
Total Protein: 7.6 g/dL (ref 6.5–8.1)

## 2019-05-23 ENCOUNTER — Encounter: Payer: Self-pay | Admitting: Hematology

## 2019-05-23 NOTE — Telephone Encounter (Signed)
Pathology report from 10/02/2017 faxed to [facility] at [phone] per wife's request.

## 2019-05-29 ENCOUNTER — Inpatient Hospital Stay (HOSPITAL_BASED_OUTPATIENT_CLINIC_OR_DEPARTMENT_OTHER): Payer: Medicare Other | Admitting: Hematology

## 2019-05-29 ENCOUNTER — Telehealth: Payer: Self-pay | Admitting: Hematology

## 2019-05-29 ENCOUNTER — Encounter: Payer: Self-pay | Admitting: Hematology

## 2019-05-29 ENCOUNTER — Other Ambulatory Visit: Payer: Self-pay

## 2019-05-29 VITALS — BP 94/52 | HR 62 | Temp 98.2°F | Resp 18 | Ht 71.0 in | Wt 237.0 lb

## 2019-05-29 DIAGNOSIS — I428 Other cardiomyopathies: Secondary | ICD-10-CM | POA: Diagnosis not present

## 2019-05-29 DIAGNOSIS — C3491 Malignant neoplasm of unspecified part of right bronchus or lung: Secondary | ICD-10-CM

## 2019-05-29 DIAGNOSIS — C3490 Malignant neoplasm of unspecified part of unspecified bronchus or lung: Secondary | ICD-10-CM | POA: Diagnosis not present

## 2019-05-29 DIAGNOSIS — C61 Malignant neoplasm of prostate: Secondary | ICD-10-CM

## 2019-05-29 NOTE — Telephone Encounter (Signed)
Scheduled appt per 3/17 los.  Sent a message to HIM pool to get a calendar mailed out.

## 2019-06-11 ENCOUNTER — Telehealth (HOSPITAL_COMMUNITY): Payer: Self-pay | Admitting: Pharmacist

## 2019-06-11 ENCOUNTER — Other Ambulatory Visit: Payer: Self-pay | Admitting: Hematology

## 2019-06-11 DIAGNOSIS — C3491 Malignant neoplasm of unspecified part of right bronchus or lung: Secondary | ICD-10-CM

## 2019-06-11 MED ORDER — ALECENSA 150 MG PO CAPS: ORAL_CAPSULE | ORAL | 2 refills | Status: DC

## 2019-06-11 NOTE — Telephone Encounter (Signed)
Received message from patient's wife that they need Eliquis samples as they have not yet met the OOP expense required to be approved for [employer] Patient Assistance in 2021. I checked patient's copay in the pharmacy system and it appears the copay has decreased to $26.23 as they are now in catastrophic coverage. Informed patient of this and they are excited to be able to fill the prescription locally. I told them to give me a call if they have any issues filling the medication at their [facility].  Lauren Kemp, PharmD, BCPS, BCCP, CPP Heart Failure Clinic Pharmacist [phone]  

## 2019-07-15 ENCOUNTER — Other Ambulatory Visit (HOSPITAL_COMMUNITY): Payer: Self-pay | Admitting: Cardiology

## 2019-08-09 ENCOUNTER — Other Ambulatory Visit (HOSPITAL_COMMUNITY): Payer: Self-pay | Admitting: Cardiology

## 2019-08-21 ENCOUNTER — Other Ambulatory Visit: Payer: Self-pay | Admitting: Internal Medicine

## 2019-08-29 ENCOUNTER — Inpatient Hospital Stay: Payer: Medicare Other

## 2019-08-29 ENCOUNTER — Inpatient Hospital Stay: Payer: Medicare Other | Attending: Hematology | Admitting: Hematology

## 2019-09-18 ENCOUNTER — Telehealth: Payer: Self-pay

## 2019-09-18 NOTE — Telephone Encounter (Signed)
Mrs. Niazi called to reschedule missed appts.  Scheduling message sent.

## 2019-09-19 ENCOUNTER — Telehealth: Payer: Self-pay | Admitting: Hematology

## 2019-09-19 NOTE — Telephone Encounter (Signed)
Scheduled appt per 7/7 sch msg - pt wife is aware of appt date and time.

## 2019-09-20 ENCOUNTER — Other Ambulatory Visit (HOSPITAL_COMMUNITY): Payer: Self-pay | Admitting: Cardiology

## 2019-09-20 ENCOUNTER — Telehealth: Payer: Self-pay | Admitting: Medical Oncology

## 2019-09-20 NOTE — Telephone Encounter (Signed)
LVM to wife to  return call re her medication questions.

## 2019-09-27 ENCOUNTER — Encounter (HOSPITAL_COMMUNITY): Payer: Self-pay | Admitting: Cardiology

## 2019-09-27 ENCOUNTER — Other Ambulatory Visit: Payer: Self-pay

## 2019-09-27 ENCOUNTER — Ambulatory Visit (HOSPITAL_COMMUNITY)
Admission: RE | Admit: 2019-09-27 | Discharge: 2019-09-27 | Disposition: A | Discharge status: Home / Self Care | Payer: Medicare Other | Source: Ambulatory Visit | Attending: Cardiology | Admitting: Cardiology

## 2019-09-27 ENCOUNTER — Telehealth (HOSPITAL_COMMUNITY): Payer: Self-pay | Admitting: *Deleted

## 2019-09-27 VITALS — BP 102/68 | HR 60 | Wt 236.0 lb

## 2019-09-27 DIAGNOSIS — Z8249 Family history of ischemic heart disease and other diseases of the circulatory system: Secondary | ICD-10-CM | POA: Insufficient documentation

## 2019-09-27 DIAGNOSIS — C3490 Malignant neoplasm of unspecified part of unspecified bronchus or lung: Secondary | ICD-10-CM | POA: Insufficient documentation

## 2019-09-27 DIAGNOSIS — C61 Malignant neoplasm of prostate: Secondary | ICD-10-CM | POA: Diagnosis not present

## 2019-09-27 DIAGNOSIS — I480 Paroxysmal atrial fibrillation: Secondary | ICD-10-CM | POA: Diagnosis not present

## 2019-09-27 DIAGNOSIS — Z7984 Long term (current) use of oral hypoglycemic drugs: Secondary | ICD-10-CM | POA: Insufficient documentation

## 2019-09-27 DIAGNOSIS — Z7901 Long term (current) use of anticoagulants: Secondary | ICD-10-CM | POA: Insufficient documentation

## 2019-09-27 DIAGNOSIS — Z8546 Personal history of malignant neoplasm of prostate: Secondary | ICD-10-CM | POA: Insufficient documentation

## 2019-09-27 DIAGNOSIS — C787 Secondary malignant neoplasm of liver and intrahepatic bile duct: Secondary | ICD-10-CM | POA: Insufficient documentation

## 2019-09-27 DIAGNOSIS — Z7982 Long term (current) use of aspirin: Secondary | ICD-10-CM | POA: Insufficient documentation

## 2019-09-27 DIAGNOSIS — Z87891 Personal history of nicotine dependence: Secondary | ICD-10-CM | POA: Insufficient documentation

## 2019-09-27 DIAGNOSIS — Z79899 Other long term (current) drug therapy: Secondary | ICD-10-CM | POA: Insufficient documentation

## 2019-09-27 DIAGNOSIS — I5022 Chronic systolic (congestive) heart failure: Secondary | ICD-10-CM | POA: Diagnosis not present

## 2019-09-27 DIAGNOSIS — Z8673 Personal history of transient ischemic attack (TIA), and cerebral infarction without residual deficits: Secondary | ICD-10-CM | POA: Diagnosis not present

## 2019-09-27 DIAGNOSIS — N183 Chronic kidney disease, stage 3 unspecified: Secondary | ICD-10-CM | POA: Insufficient documentation

## 2019-09-27 DIAGNOSIS — I130 Hypertensive heart and chronic kidney disease with heart failure and stage 1 through stage 4 chronic kidney disease, or unspecified chronic kidney disease: Secondary | ICD-10-CM | POA: Insufficient documentation

## 2019-09-27 DIAGNOSIS — I428 Other cardiomyopathies: Secondary | ICD-10-CM | POA: Insufficient documentation

## 2019-09-27 LAB — COMPREHENSIVE METABOLIC PANEL
ALT: 19 U/L (ref 0–44)
AST: 21 U/L (ref 15–41)
Albumin: 4.2 g/dL (ref 3.5–5.0)
Alkaline Phosphatase: 50 U/L (ref 38–126)
Anion gap: 9 (ref 5–15)
BUN: 27 mg/dL — ABNORMAL HIGH (ref 8–23)
CO2: 26 mmol/L (ref 22–32)
Calcium: 9.1 mg/dL (ref 8.9–10.3)
Chloride: 102 mmol/L (ref 98–111)
Creatinine, Ser: 2.19 mg/dL — ABNORMAL HIGH (ref 0.61–1.24)
GFR calc Af Amer: 34 mL/min — ABNORMAL LOW (ref >60)
GFR calc non Af Amer: 30 mL/min — ABNORMAL LOW (ref >60)
Glucose, Bld: 99 mg/dL (ref 70–99)
Potassium: 4.3 mmol/L (ref 3.5–5.1)
Sodium: 137 mmol/L (ref 135–145)
Total Bilirubin: 0.5 mg/dL (ref 0.3–1.2)
Total Protein: 6.8 g/dL (ref 6.5–8.1)

## 2019-09-27 LAB — CBC
HCT: 35 % — ABNORMAL LOW (ref 39.0–52.0)
Hemoglobin: 12.1 g/dL — ABNORMAL LOW (ref 13.0–17.0)
MCH: 31.8 pg (ref 26.0–34.0)
MCHC: 34.6 g/dL (ref 30.0–36.0)
MCV: 92.1 fL (ref 80.0–100.0)
Platelets: 148 10*3/uL — ABNORMAL LOW (ref 150–400)
RBC: 3.8 MIL/uL — ABNORMAL LOW (ref 4.22–5.81)
RDW: 13.4 % (ref 11.5–15.5)
WBC: 5 10*3/uL (ref 4.0–10.5)
nRBC: 0 % (ref 0.0–0.2)

## 2019-09-27 LAB — TSH: TSH: 2.172 u[IU]/mL (ref 0.350–4.500)

## 2019-09-27 MED ORDER — DAPAGLIFLOZIN PROPANEDIOL 10 MG PO TABS: 10.0000 mg | ORAL_TABLET | Freq: Every day | ORAL | 11 refills | Status: DC

## 2019-09-27 MED ORDER — FUROSEMIDE 80 MG PO TABS: ORAL_TABLET | ORAL | 3 refills | Status: DC

## 2019-09-27 MED ORDER — FUROSEMIDE 80 MG PO TABS: 40.0000 mg | ORAL_TABLET | Freq: Every day | ORAL | 3 refills | Status: DC

## 2019-09-27 NOTE — Telephone Encounter (Signed)
-----   Message from [name], MD sent at 09/27/2019  2:41 PM EDT ----- Continue plan to start [place] but would decrease Lasix to 40 mg daily (rather than alternating 80 daily and 40 daily).

## 2019-09-27 NOTE — Telephone Encounter (Signed)
Harvie Junior, [place]  09/27/2019 3:21 PM EDT Back to Top    Spoke with pt he is aware and verbalized understanding.    [name], MD  09/27/2019 2:41 PM EDT     Continue plan to start [place] but would decrease Lasix to 40 mg daily (rather than alternating 80 daily and 40 daily).

## 2019-09-27 NOTE — Patient Instructions (Addendum)
START Farxiga 10 mg one tab daily CHANGE Lasix to 80 mg daily alternating with 40 mg daily  Labs today We will only contact you if something comes back abnormal or we need to make some changes. Otherwise no news is good news!  Labs needed in 2 weeks  Your physician recommends that you schedule a follow-up appointment in: 3 months with Dr Aundra Dubin and echo  Your physician has requested that you have an echocardiogram. Echocardiography is a painless test that uses sound waves to create images of your heart. It provides your doctor with information about the size and shape of your heart and how well your heart's chambers and valves are working. This procedure takes approximately one hour. There are no restrictions for this procedure.  Do the following things EVERYDAY: 1) Weigh yourself in the morning before breakfast. Write it down and keep it in a log. 2) Take your medicines as prescribed 3) Eat low salt foods--Limit salt (sodium) to 2000 mg per day.  4) Stay as active as you can everyday 5) Limit all fluids for the day to less than 2 liters

## 2019-09-29 NOTE — Progress Notes (Signed)
Advanced Heart Failure Clinic Note   Referring Physician: PCP: [name], MD PCP-Cardiologist: Sinclair Grooms, MD  HF: Dr Aundra Dubin  HPI: Logan [name] is a 69 y.o. male  with history of HTN, paroxysmal atrial fibrillation, OSA, untreated prostate cancer 2012 , RLL mass 2015 declined treatment, and chronic systolic heart failure.   Admitted 2/67-03/16/43 with A/C systolic HF. Required thoracentesis 7/17. Echo showed EF 15% with biventricular failure. Course complicated by Afib RVR and associated flash pulmonary edema. Required intubation and pressors. Diuresed with IV lasix. Oncology consulted for lung cancer (RLL) with liver mets. MRI brain was negative for mets, but showed small left cerebellar infarct. He had hemoptysis with heparin, so he was only discharged on ASA 81 mg daily. HF meds were optimized. He was eventually started on apixaban.   He saw Dr Logan [name] and was started on Alectinib, which has potential cardiac toxicity, especially bradycardia.   Admitted to [facility] with increased dyspnea on 01/02/18. Had PEA arrest and required intubation. LHC with no coronary disease. LVEDP was 40 so he was diuresed with IV lasix and later transitioned to 40 mg po lasix. Discharge weight 221 pound. He was discharged to home  01/06/2018.  Echo in 10/19 showed EF 25-30%.    Echo in 8/20 showed EF 30-35% with mildly decreased RV systolic function.   He presents today for followup of CHF.  He remains on alectinib for treatment of his metastatic lung adenocarcinoma, no progression so far. He has been doing well.  He walks 1 mile on most days without dyspnea.  No dyspnea walking up a flight of stairs.  No hemoptysis, no BRBPR/melena.  No palpitations.  No chest pain.  No lightheadedness.   Labs (3/20): K 4.5, creatinine 1.45 => 1.71, LFTs normal, hgb 11.2 Labs (7/20): K 3.9, creatinine 1.48, LFTs normal, hgb 11.7 Labs (3/21): hgb 12, K 4.5, creatinine 1.64  ECG (personally reviewed):  NSR, 1st degree AVB, poor RWP  PMH: 1. Metastatic non-small cell lung cancer: he is getting alectinib with good response so far.   - Liver mets 2. Prostate cancer: Untreated.  3. Atrial fibrillation: Paroxysmal.  4. 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  5. Chronic systolic CHF: Nonischemic cardiomyopathy.   - Echo (7/19): EF 15%.  - LHC at [facility] 01/02/2018: No significant coronary disease.   - Echo (10/19): EF 25-30%, moderate LV dilation, mild LVH.  - Echo (8/20): EF 80-99%, grade 2 diastolic dysfunction, mildly decreased RV systolic function, normal IVC.  6. HTN 7. CKD: Stage 3  Review of systems complete and found to be negative unless listed in HPI.    Current Outpatient Medications  Medication Sig Dispense Refill  . alectinib (ALECENSA) 150 MG capsule TAKE 4 CAPSULES BY MOUTH TWICE A DAY WITH A MEAL 240 capsule 2  . amiodarone (PACERONE) 100 MG tablet Take 1 tablet (100 mg total) by mouth daily. Needs appt for further refills 90 tablet 0  . carvedilol (COREG) 6.25 MG tablet Take 1 tablet (6.25 mg total) by mouth 2 (two) times daily with a meal. 180 tablet 3  . Cholecalciferol ([employer] VIT D 5000 HIGH-POTENCY) 5000 units capsule Take 5,000 Units by mouth 2 (two) times daily.     Marland Kitchen ELIQUIS 5 MG TABS tablet TAKE ONE TABLET BY MOUTH TWICE A DAY 60 tablet 5  . Multiple Vitamin (MULTIVITAMIN) tablet Take 1 tablet by mouth daily.    . potassium chloride SA (KLOR-CON) 20 MEQ tablet Take  1 tablet (20 mEq total) by mouth daily. 90 tablet 1  . sacubitril-valsartan (ENTRESTO) 24-26 MG Take 1 tablet by mouth 2 (two) times daily. 180 tablet 3  . spironolactone (ALDACTONE) 25 MG tablet TAKE 1/2 TABLET EVERY DAY 45 tablet 3  . dapagliflozin propanediol (FARXIGA) 10 MG TABS tablet Take 1 tablet (10 mg total) by mouth daily before breakfast. 30 tablet 11  . digoxin (LANOXIN) 0.125 MG tablet Take 1 tablet (0.125 mg total) by mouth daily. (Patient not taking: Reported  on 09/27/2019) 90 tablet 3  . furosemide (LASIX) 80 MG tablet Take 0.5 tablets (40 mg total) by mouth daily. 90 tablet 3  . traZODone (DESYREL) 50 MG tablet Take 1 tablet (50 mg total) by mouth at bedtime as needed for sleep. (Patient not taking: Reported on 09/27/2019) 20 tablet 0   No current facility-administered medications for this encounter.   No Known Allergies  Social History   Socioeconomic History  . Marital status: Married    Spouse name: Not on file  . Number of children: Not on file  . Years of education: Not on file  . Highest education level: Not on file  Occupational History  . Not on file  Tobacco Use  . Smoking status: Former Smoker    Years: 6.50    Types: Cigarettes, Cigars, Pipe    Quit date: 03/14/1974    Years since quitting: 45.5  . Smokeless tobacco: Never Used  Vaping Use  . Vaping Use: Never used  Substance and Sexual Activity  . Alcohol use: Yes    Alcohol/week: 0.0 standard drinks    Comment: 09/26/2017 "holidays only"  . Drug use: Never  . Sexual activity: Yes  Other Topics Concern  . Not on file  Social History Narrative  . Not on file   Social Determinants of Health   Financial Resource Strain:   . Difficulty of Paying Living Expenses:   Food Insecurity:   . Worried About Charity fundraiser in the Last Year:   . Arboriculturist in the Last Year:   Transportation Needs:   . Film/video editor (Medical):   Marland Kitchen Lack of Transportation (Non-Medical):   Physical Activity:   . Days of Exercise per Week:   . Minutes of Exercise per Session:   Stress:   . Feeling of Stress :   Social Connections:   . Frequency of Communication with Friends and Family:   . Frequency of Social Gatherings with Friends and Family:   . Attends Religious Services:   . Active Member of Clubs or Organizations:   . Attends Archivist Meetings:   Marland Kitchen Marital Status:   Intimate Partner Violence:   . Fear of Current or Ex-Partner:   . Emotionally Abused:    Marland Kitchen Physically Abused:   . Sexually Abused:    Family History  Problem Relation Age of Onset  . Parkinson's disease Mother   . Hypertension Father    Vitals:   09/27/19 1158  BP: 102/68  Pulse: 60  SpO2: 96%  Weight: 107 kg (236 lb)   Wt Readings from Last 3 Encounters:  09/27/19 107 kg (236 lb)  05/29/19 107.5 kg (237 lb)  11/08/18 104.1 kg (229 lb 6.4 oz)   PHYSICAL EXAM: General: NAD Neck: No JVD, no thyromegaly or thyroid nodule.  Lungs: Clear to auscultation bilaterally with normal respiratory effort. CV: Nondisplaced PMI.  Heart regular S1/S2, no S3/S4, no murmur.  No peripheral edema.  No carotid  bruit.  Normal pedal pulses.  Abdomen: Soft, nontender, no hepatosplenomegaly, no distention.  Skin: Intact without lesions or rashes.  Neurologic: Alert and oriented x 3.  Psych: Normal affect. Extremities: No clubbing or cyanosis.  HEENT: Normal.   ASSESSMENT & PLAN:  1. Chronic systolic CHF: Long history of nonischemic cardiomyopathy.  He does not have an ICD due to metastatic lung cancer and untreated prostate cancer.  Echo in 8/20 showed EF mildly higher at 30-35%.  NYHA class II symptoms.  He is not volume overloaded on exam.  - Start Farxiga 10 mg daily and decrease Lasix to 40 mg daily.  BMET today and again in 10 days.  - Continue Entresto 24/26 mg BID, do not think that he has BP room to increase today.  - Continue Coreg 6.25 mg bid.  - Continue spironolactone 25 mg daily.    -ICD not planned due to metastatic cancer diagnosis though he is doing well on therapy with alectinib.  - Repeat echo at followup in 3 months.  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 Good response so far with alectinib.  - Follows with Dr Logan [name].   3. Atrial fibrillation: Paroxysmal. He is in NSR today.  - Continue amiodarone 100 mg daily. Check LFTs and TSH today.  He will need regular eye exam.  - Continue Eliquis 5 mg BID.  4. CKD: Stage 3.  - BMET today.  5.  Prostate cancer: Untreated.  6. 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  He is on Eliquis.   Followup in 3 months with echo  Loralie Champagne, MD 09/29/19

## 2019-10-24 ENCOUNTER — Telehealth (HOSPITAL_COMMUNITY): Payer: Self-pay | Admitting: Pharmacy Technician

## 2019-10-24 NOTE — Telephone Encounter (Signed)
It's time to re-enroll patient to receive medication assistance for Entresto from [employer]. I emailed the patient's wife the application. The patient's 30 day co-pay is $31. If that is unaffordable, we will seek renewal of assistance.  Will follow up.

## 2019-10-28 ENCOUNTER — Other Ambulatory Visit (HOSPITAL_COMMUNITY): Payer: Self-pay | Admitting: *Deleted

## 2019-10-28 MED ORDER — ENTRESTO 24-26 MG PO TABS: 1.0000 | ORAL_TABLET | Freq: Two times a day (BID) | ORAL | 3 refills | Status: DC

## 2019-10-28 NOTE — Telephone Encounter (Signed)
Patient's wife called and left a message. They would like to pick his Entresto up at the pharmacy rather than pursuing assistance at this time.   I called to confirm which pharmacy to send the prescription to. They would like to pick it up at [facility]. Will have prescription sent there.  Charlann Boxer, CPhT

## 2019-11-04 NOTE — Progress Notes (Signed)
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 Cardiomegaly with diffuse increased interstitial markings consistent with pulmonary edema. 2. 5-6 cm rounded density seen in the right lower lobe medially. This could be related to an area of consolidation correlate clinically for pneumonia.. But the rounded and well-defined borders are suspicious for mass. Followup x-ray suggested after  treating for the pulmonary edema/pneumonia to see if this resolves.   04/30/2014 Imaging   04/30/2014 CXR IMPRESSION: Enlarging right lower lobe nodule. Would recommend PET CT to further evaluate.   01/20/2015 Imaging   01/20/2015 CXR IMPRESSION: Significant improvement comparatively, now with smaller pleural effusions and resolving pulmonary edema. Known right lower lobe mass suspicious for bronchogenic carcinoma is redemonstrated.   04/07/2015 PET scan   04/07/2015 PET Scan IMPRESSION:  Hypermetabolic right lower lobe mass Hypermetabolic left adrenal nodule.   08/21/2015 Imaging   08/21/2015 CXR IMPRESSION: Right lower lobe mass.   09/22/2017 Imaging   09/22/2017 CXR IMPRESSION: 1.Moderate right pleural effusion with right lower/middle lobe consolidation. Minimal  patchy opacities in left lower lobe. 2.Cardiomegaly.   09/26/2017 Imaging   09/26/2017 CT Chest WO Contrast IMPRESSION: 1. Large RIGHT lower lobe mass with central and peripheral calcifications occupies the near entirety of the RIGHT lower lobe and concerning for neoplasm. 2. Moderate RIGHT effusion. 3. Mild mediastinal and RIGHT hilar adenopathy. 4. Recommend FDG PET scan for biopsy planning/staging   09/26/2017 Tumor Marker    Ref. Range 09/26/2017 21:31  Prostatic Specific Antigen Latest Ref Range: 0.00 - 4.00 ng/mL 84.83 (H)      09/27/2017 Imaging   09/27/2017 CT AP W WO Contrast IMPRESSION: 1. Large complex masses in the right lower lobe of the lung and in the right hepatic lobe. Smaller liver lesions in the left hepatic lobe. Nonspecific lesions in the spleen and kidneys. No significant pathologic adenopathy. This would not be a characteristic metastatic pattern for prostate cancer and is not a specific metastatic and imaging pattern to suggest an individual etiology. Possibilities might include metastatic lung cancer, melanoma, metastatic hepatocellular carcinoma, or a variety of other possibilities. Tissue diagnosis recommended. 2. Moderate prostatomegaly. 3. Scattered mild ascites. 4. Moderate cardiomegaly.  Small inferior pericardial effusion. 5. Trace bilateral pleural effusions.   10/02/2017 Pathology Results   10/02/2017 Surgical Pathology Diagnosis Liver, needle/core biopsy, Left Hepatic Lobe - METASTATIC LUNG ADENOCARCINOMA TO THE LIVER. SEE NOTE   10/02/2017 Cancer Staging   Staging form: Lung, AJCC 8th Edition - Clinical stage from 10/02/2017: Stage IV (cT3, cN2, cM1c) - Signed by Truitt Merle, MD on 10/20/2017   10/04/2017 Initial Diagnosis   Non-small cell lung cancer (NSCLC) ([place])   10/21/2017 -  Antibody Plan   Alectinib $RemoveBe'600mg'QOWgMieBr$  bid started on 10/21/2017   01/10/2018 PET scan   01/10/2018 PET Scan IMPRESSION:  1. Marked reduction in size of the right lower  lobe mass with fairly low metabolic activity in the residual mass, maximum SUV 5.3. Notably reduced thoracic adenopathy which is not currently hypermetabolic. 2. Marked reduction in size and conspicuity of the hepatic tumors, both of which have metabolic activity similar to that of the background liver parenchyma. 3. Resolution of the splenic mass. No additional or new lesions are identified. 4. Accentuated activity anteriorly in the left seventh rib, low-grade with maximum SUV 3.5, without appreciable CT abnormality on the CT data, significance uncertain. 5. Accentuated activity at the right sternoclavicular joint, SUV 7.2, probably from arthropathy. 6. Accentuated activity in the posterior and apical portions of the prostate gland, significance uncertain. 7. Other imaging findings of potential clinical significance: Chronic left maxillary sinusitis.   10/03/2018 Imaging   CT CAP W contrast 10/03/18 IMPRESSION: 1. No new or progressive findings on today's study. 2. Continued further slight reduction in size of the right lower mass. No lymphadenopathy in the chest. 3. Continued further decrease in size of liver lesions with a left hepatic lesion noted previously not apparent on today's study. 4. Stable 6 mm ill-defined subpleural nodule right upper lobe. Continued attention on follow-up recommended.   05/22/2019 Imaging   CT CAP WO Contrast  IMPRESSION: 1. Unchanged post treatment consolidation and calcification of the anterior right lower lobe. 2. Unchanged capsular retraction of the anterior liver dome, a hypodense mass at this site on prior examinations not discretely appreciated. 3. No noncontrast evidence of new intrathoracic or intra-abdominal metastatic disease. 4. Unchanged subpleural ground-glass nodule of the right upper lobe measuring 6 mm. An indolent lung malignancy is not excluded. Continued attention on follow-up. 5. Prostatomegaly.        CURRENT THERAPY:   Alectinib $RemoveBe'600mg'qwknNLpNa$ BIDstarted on 10/21/2017  INTERVAL HISTORY:  Logan [name] is here for a follow up of metastatic lung cancer. He was last seen by me 5 months ago. He presents to the clinic alone. He notes his wife did not come due to [place] and she is busy but would like a summary of visit, so she was called.  He notes he is doing well. He has adequate energy and eating and no recent hospital visits. He notes he is meditating, taking supplements and working on losing weight. He is tolerating Alectinib without major issues. He is seen by health maintenance. He sees Dr Aundra Dubin as well. His wife notes he has intermittent joint pain in his knees and arms. He wears knee brace. He notes this is related to his sickle cell trait.     REVIEW OF SYSTEMS:   Constitutional: Denies fevers, chills or abnormal weight loss Eyes: Denies blurriness of vision Ears, nose, mouth, throat, and face: Denies mucositis or sore throat Respiratory: Denies cough, dyspnea or wheezes Cardiovascular: Denies palpitation, chest discomfort or lower extremity swelling Gastrointestinal:  Denies nausea, heartburn or change in bowel habits Skin: Denies abnormal skin rashes MSK: (+) Knee and arm pain from arthritis  Lymphatics: Denies new lymphadenopathy or easy bruising Neurological:Denies numbness, tingling or new weaknesses Behavioral/Psych: Mood is stable, no new changes  All other systems were reviewed with the patient and are negative.  MEDICAL HISTORY:  Past Medical History:  Diagnosis Date  . CHF (congestive heart failure) ([place])   . Chronic systolic heart failure ([place]) 04/15/2015    EF 30-35% by echo 2016 , [facility]   . Hypertension   . Lung mass 04/15/2015    Never biopsied. History of PET scan that  raised concern.   . Metabolic syndrome 04/15/2015  . OSA on CPAP   . Paroxysmal atrial fibrillation ([facility]) 04/15/2015   On apixaban   . Prostate cancer ([facility])   . Thrombocytopenia ([facility]) 04/15/2015    SURGICAL  HISTORY: Past Surgical History:  Procedure Laterality Date  . CARDIAC CATHETERIZATION N/A 04/27/2015   Procedure: Right/Left Heart Cath and Coronary Angiography;  Surgeon: Lyn Records, MD;  Location[facility] INVASIVE CV LAB;  Service: Cardiovascular;  Laterality: N/A;  . CARDIOVERSION N/A 09/29/2017   Procedure: CARDIOVERSION;  Surgeon: Chrystie Nose, MD;  Location: [facility] ENDOSCOPY;  Service: Cardiovascular;  Laterality: N/A;  . IR THORACENTESIS ASP PLEURAL SPACE W/IMG GUIDE  09/27/2017  . PROSTATE BIOPSY    . TEE WITHOUT CARDIOVERSION N/A 09/29/2017   Procedure: TRANSESOPHAGEAL ECHOCARDIOGRAM (TEE);  Surgeon: Chrystie Nose, MD;  Location: [facility] ENDOSCOPY;  Service: Cardiovascular;  Laterality: N/A;    I have reviewed the social history and family history with the patient and they are unchanged from previous note.  ALLERGIES:  has No Known Allergies.  MEDICATIONS:  Current Outpatient Medications  Medication Sig Dispense Refill  . alectinib (ALECENSA) 150 MG capsule TAKE 4 CAPSULES BY MOUTH TWICE A DAY WITH A MEAL 240 capsule 2  . amiodarone (PACERONE) 100 MG tablet Take 1 tablet (100 mg total) by mouth daily. Needs appt for further refills 90 tablet 0  . carvedilol (COREG) 6.25 MG tablet Take 1 tablet (6.25 mg total) by mouth 2 (two) times daily with a meal. 180 tablet 3  . Cholecalciferol ([employer] VIT D 5000 HIGH-POTENCY) 5000 units capsule Take 5,000 Units by mouth 2 (two) times daily.     . dapagliflozin propanediol (FARXIGA) 10 MG TABS tablet Take 1 tablet (10 mg total) by mouth daily before breakfast. 30 tablet 11  . digoxin (LANOXIN) 0.125 MG tablet Take 1 tablet (0.125 mg total) by mouth daily. (Patient not taking: Reported on 09/27/2019) 90 tablet 3  . ELIQUIS 5 MG TABS tablet TAKE ONE TABLET BY MOUTH TWICE A DAY 60 tablet 5  . furosemide (LASIX) 80 MG tablet Take 0.5 tablets (40 mg total) by mouth daily. 90 tablet 3  . Multiple Vitamin (MULTIVITAMIN) tablet Take 1 tablet by mouth daily.     . potassium chloride SA (KLOR-CON) 20 MEQ tablet Take 1 tablet (20 mEq total) by mouth daily. 90 tablet 1  . sacubitril-valsartan (ENTRESTO) 24-26 MG Take 1 tablet by mouth 2 (two) times daily. 60 tablet 3  . spironolactone (ALDACTONE) 25 MG tablet TAKE 1/2 TABLET EVERY DAY 45 tablet 3  . traZODone (DESYREL) 50 MG tablet Take 1 tablet (50 mg total) by mouth at bedtime as needed for sleep. (Patient not taking: Reported on 09/27/2019) 20 tablet 0   No current facility-administered medications for this visit.    PHYSICAL EXAMINATION: ECOG PERFORMANCE STATUS: 1 - Symptomatic but completely ambulatory  Vitals:   11/06/19 0943  BP: (!) 124/96  Pulse: 61  Resp: 18  Temp: (!) 97.1 F (36.2 C)  SpO2: 100%   Filed Weights   11/06/19 0943  Weight: 232 lb 4.8 oz (105.4 kg)    GENERAL:alert, no distress and comfortable SKIN: skin color, texture, turgor are normal, no rashes or significant lesions EYES: normal, Conjunctiva are pink and non-injected, sclera clear  NECK: supple, thyroid normal size, non-tender, without nodularity LYMPH:  no palpable lymphadenopathy in the cervical, axillary  LUNGS: clear to auscultation and percussion with normal breathing effort HEART: regular rate & rhythm and  no murmurs and no lower extremity edema ABDOMEN:abdomen soft, non-tender and normal bowel sounds (+) Mild abdominal hernia, non tender Musculoskeletal:no cyanosis of digits and no clubbing  NEURO: alert & oriented x 3 with fluent speech, no focal motor/sensory deficits  LABORATORY DATA:  I have reviewed the data as listed CBC Latest Ref Rng & Units 11/06/2019 09/27/2019 05/22/2019  WBC 4.0 - 10.5 K/uL 4.6 5.0 6.0  Hemoglobin 13.0 - 17.0 g/dL 11.6(L) 12.1(L) 12.0(L)  Hematocrit 39 - 52 % 33.0(L) 35.0(L) 35.2(L)  Platelets 150 - 400 K/uL 142(L) 148(L) 150     CMP Latest Ref Rng & Units 11/06/2019 09/27/2019 05/22/2019  Glucose 70 - 99 mg/dL 104(H) 99 94  BUN 8 - 23 mg/dL 27(H) 27(H) 29(H)   Creatinine 0.61 - 1.24 mg/dL 1.83(H) 2.19(H) 1.64(H)  Sodium 135 - 145 mmol/L 140 137 139  Potassium 3.5 - 5.1 mmol/L 4.1 4.3 4.5  Chloride 98 - 111 mmol/L 106 102 103  CO2 22 - 32 mmol/L 26 26 27   Calcium 8.9 - 10.3 mg/dL 9.7 9.1 9.0  Total Protein 6.5 - 8.1 g/dL 7.4 6.8 7.6  Total Bilirubin 0.3 - 1.2 mg/dL 0.5 0.5 0.5  Alkaline Phos 38 - 126 U/L 51 50 55  AST 15 - 41 U/L 18 21 23   ALT 0 - 44 U/L 13 19 18       RADIOGRAPHIC STUDIES: I have personally reviewed the radiological images as listed and agreed with the findings in the report. No results found.   ASSESSMENT & PLAN:  ZEALAND BOYETT is a 69 y.o. male with    1. 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 -Due to his advanced heart failure, he isnota good candidate for intensive chemotherapy. -tumor NGS showed ALK translocation (+) -He started Alectinib on 10/21/2017, tolerating very wellwith manageable constipation  -He is clinically doing well and has no major concerns. Labs reviewed, Hg 11.6, plt 142K. Physical exam.  -Continue Alectinib 600mg  BID. He has been on treatment for 2 years and statically he may have disease progression in the near future.  -I encouraged him to continue regular follow ups, especially while on treatment. Will continue to monitor his response with CT scan every 4-6 months, he is due for scan in 11/2019. I will order today.  -F/u in 3 months. I encouraged him to watch for concerning symptoms.  -he has been on alectinib for 2 years, we discussed that he will likely progress in the next year, since everyone develops resistance at some point, I plan to follow him closely  -He has received both his COVID19 vaccines. I discussed he is eligible for COVID booster vaccine.    2.History of untreated prostate cancer,  with rising PSA -will f/u PSA -Prostatomegaly seen on CT CAP from 05/22/19   3.Bilateral pleural effusion,R>L, likely related to CHF -s/p thoracentesis 7/17 with cytology showing reactive mesothelial cells -Stable on f/u CXR.He has minimum effusion on March 2021 CT scan -He is on lasix. 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 Morton Amy Dr. Tamala Julian -Currently on digoxin, Lasix, spironolactone and Cozaar -He is not a candidate for advanced cardiac therapiesdue to his stage IV lung cancer -  F/u with cardiologist -He notes he BP has been on the normal to low side recently. He should f/u with Cardiologist Dr Aundra Dubin about possible medication change.    5. Atrial Fibrillation -Was on Xarelto but off due to hemoptysis -Continue aspirin 81 and follow-up with cardiology to decide on antiplatelet versus anticoagulation -OnEliquis 5 mg  6. Incidentalstroke:leftcerebellar smallinfarct -Resultantno neurological focal deficit -MRIpunctate to small left cerebellar infarct. MRAunremarkable. CarotidDopplerunremarkable -Xarelto (rivaroxaban) dailyprior to admission (off due to hemoptysis), now onaspirin 81 mg daily. -Ideally, patient should be on anticoagulation however due to severe hemoptysis, hematuria and anemia, patient not anticoagulated candidate at this time. Agree with cardiology plan to continue aspirin 81 and then follow-up as outpatient.  7. Goal of care discussion  -The patient understands the goal of care is palliative. -He is full code now   8.CKD stage III -stable, continue monitoring   Plan: -He is clinically doing well -CT CAP wo contrast in 2 weeks  -ContinueAlectinib600mg  BID -Lab and f/u in 3 months, will order restaging scan on next visit  -I spoke  with his wife on the phone during his visit    No problem-specific Assessment & Plan notes found for this encounter.   Orders Placed This Encounter  Procedures  . CT Abdomen Pelvis Wo Contrast    Standing Status:   Future    Standing Expiration Date:   11/05/2020    Order Specific Question:   Preferred imaging location?    Answer:   [facility]    Order Specific Question:   Release to patient    Answer:   Immediate    Order Specific Question:   Is Oral Contrast requested for this exam?    Answer:   Yes, Per Radiology protocol    Order Specific Question:   Radiology Contrast Protocol - do NOT remove file path    Answer:   \\[email or web address]  . CT Chest Wo Contrast    Standing Status:   Future    Standing Expiration Date:   11/05/2020    Order Specific Question:   Preferred imaging location?    Answer:   [facility]    Order Specific Question:   Radiology Contrast Protocol - do NOT remove file path    Answer:   \\[email or web address]   All questions were answered. The patient knows to call the clinic with any problems, questions or concerns. No barriers to learning was detected. The total time spent in the appointment was 30 minutes.     Truitt Merle, MD 11/06/2019   I, Joslyn Devon, am acting as scribe for Truitt Merle, MD.   I have reviewed the above documentation for accuracy and completeness, and I agree with the above.

## 2019-11-06 ENCOUNTER — Inpatient Hospital Stay: Payer: Medicare Other | Attending: Hematology | Admitting: Hematology

## 2019-11-06 ENCOUNTER — Other Ambulatory Visit: Payer: Self-pay

## 2019-11-06 ENCOUNTER — Inpatient Hospital Stay: Payer: Medicare Other

## 2019-11-06 ENCOUNTER — Encounter: Payer: Self-pay | Admitting: Hematology

## 2019-11-06 ENCOUNTER — Telehealth: Payer: Self-pay | Admitting: Hematology

## 2019-11-06 VITALS — BP 124/96 | HR 61 | Temp 97.1°F | Resp 18 | Ht 71.0 in | Wt 232.3 lb

## 2019-11-06 DIAGNOSIS — C779 Secondary and unspecified malignant neoplasm of lymph node, unspecified: Secondary | ICD-10-CM | POA: Insufficient documentation

## 2019-11-06 DIAGNOSIS — J90 Pleural effusion, not elsewhere classified: Secondary | ICD-10-CM | POA: Insufficient documentation

## 2019-11-06 DIAGNOSIS — C787 Secondary malignant neoplasm of liver and intrahepatic bile duct: Secondary | ICD-10-CM | POA: Insufficient documentation

## 2019-11-06 DIAGNOSIS — N183 Chronic kidney disease, stage 3 unspecified: Secondary | ICD-10-CM | POA: Diagnosis not present

## 2019-11-06 DIAGNOSIS — C3491 Malignant neoplasm of unspecified part of right bronchus or lung: Secondary | ICD-10-CM

## 2019-11-06 DIAGNOSIS — I428 Other cardiomyopathies: Secondary | ICD-10-CM

## 2019-11-06 DIAGNOSIS — I429 Cardiomyopathy, unspecified: Secondary | ICD-10-CM | POA: Diagnosis not present

## 2019-11-06 DIAGNOSIS — I480 Paroxysmal atrial fibrillation: Secondary | ICD-10-CM | POA: Diagnosis not present

## 2019-11-06 DIAGNOSIS — I509 Heart failure, unspecified: Secondary | ICD-10-CM | POA: Insufficient documentation

## 2019-11-06 DIAGNOSIS — I4891 Unspecified atrial fibrillation: Secondary | ICD-10-CM | POA: Diagnosis not present

## 2019-11-06 DIAGNOSIS — Z9079 Acquired absence of other genital organ(s): Secondary | ICD-10-CM | POA: Insufficient documentation

## 2019-11-06 DIAGNOSIS — C3431 Malignant neoplasm of lower lobe, right bronchus or lung: Secondary | ICD-10-CM | POA: Diagnosis not present

## 2019-11-06 DIAGNOSIS — Z7901 Long term (current) use of anticoagulants: Secondary | ICD-10-CM | POA: Diagnosis not present

## 2019-11-06 DIAGNOSIS — R9721 Rising PSA following treatment for malignant neoplasm of prostate: Secondary | ICD-10-CM | POA: Diagnosis not present

## 2019-11-06 DIAGNOSIS — Z7982 Long term (current) use of aspirin: Secondary | ICD-10-CM | POA: Diagnosis not present

## 2019-11-06 DIAGNOSIS — Z8546 Personal history of malignant neoplasm of prostate: Secondary | ICD-10-CM | POA: Insufficient documentation

## 2019-11-06 DIAGNOSIS — C3490 Malignant neoplasm of unspecified part of unspecified bronchus or lung: Secondary | ICD-10-CM | POA: Diagnosis not present

## 2019-11-06 DIAGNOSIS — Z8673 Personal history of transient ischemic attack (TIA), and cerebral infarction without residual deficits: Secondary | ICD-10-CM | POA: Insufficient documentation

## 2019-11-06 DIAGNOSIS — Z79899 Other long term (current) drug therapy: Secondary | ICD-10-CM | POA: Insufficient documentation

## 2019-11-06 LAB — CBC WITH DIFFERENTIAL (CANCER CENTER ONLY)
Abs Immature Granulocytes: 0.02 10*3/uL (ref 0.00–0.07)
Basophils Absolute: 0.1 10*3/uL (ref 0.0–0.1)
Basophils Relative: 1 %
Eosinophils Absolute: 0.1 10*3/uL (ref 0.0–0.5)
Eosinophils Relative: 1 %
HCT: 33 % — ABNORMAL LOW (ref 39.0–52.0)
Hemoglobin: 11.6 g/dL — ABNORMAL LOW (ref 13.0–17.0)
Immature Granulocytes: 0 %
Lymphocytes Relative: 50 %
Lymphs Abs: 2.3 10*3/uL (ref 0.7–4.0)
MCH: 31.4 pg (ref 26.0–34.0)
MCHC: 35.2 g/dL (ref 30.0–36.0)
MCV: 89.4 fL (ref 80.0–100.0)
Monocytes Absolute: 0.4 10*3/uL (ref 0.1–1.0)
Monocytes Relative: 9 %
Neutro Abs: 1.8 10*3/uL (ref 1.7–7.7)
Neutrophils Relative %: 39 %
Platelet Count: 142 10*3/uL — ABNORMAL LOW (ref 150–400)
RBC: 3.69 MIL/uL — ABNORMAL LOW (ref 4.22–5.81)
RDW: 13 % (ref 11.5–15.5)
WBC Count: 4.6 10*3/uL (ref 4.0–10.5)
nRBC: 0 % (ref 0.0–0.2)

## 2019-11-06 LAB — CMP (CANCER CENTER ONLY)
ALT: 13 U/L (ref 0–44)
AST: 18 U/L (ref 15–41)
Albumin: 4.1 g/dL (ref 3.5–5.0)
Alkaline Phosphatase: 51 U/L (ref 38–126)
Anion gap: 8 (ref 5–15)
BUN: 27 mg/dL — ABNORMAL HIGH (ref 8–23)
CO2: 26 mmol/L (ref 22–32)
Calcium: 9.7 mg/dL (ref 8.9–10.3)
Chloride: 106 mmol/L (ref 98–111)
Creatinine: 1.83 mg/dL — ABNORMAL HIGH (ref 0.61–1.24)
GFR, Est AFR Am: 43 mL/min — ABNORMAL LOW (ref >60)
GFR, Estimated: 37 mL/min — ABNORMAL LOW (ref >60)
Glucose, Bld: 104 mg/dL — ABNORMAL HIGH (ref 70–99)
Potassium: 4.1 mmol/L (ref 3.5–5.1)
Sodium: 140 mmol/L (ref 135–145)
Total Bilirubin: 0.5 mg/dL (ref 0.3–1.2)
Total Protein: 7.4 g/dL (ref 6.5–8.1)

## 2019-11-06 MED ORDER — ALECENSA 150 MG PO CAPS: ORAL_CAPSULE | ORAL | 2 refills | Status: DC

## 2019-11-06 NOTE — Telephone Encounter (Signed)
Scheduled per 08/25, patient will be notified per my chart.

## 2019-11-15 ENCOUNTER — Telehealth: Payer: Self-pay | Admitting: Hematology

## 2019-11-15 NOTE — Telephone Encounter (Signed)
Scheduled appointment per 9/3 scheduling message. Called patient's wife and left message for her to call back to go over appointment.

## 2019-11-19 ENCOUNTER — Ambulatory Visit: Payer: Medicare Other | Attending: Internal Medicine

## 2019-11-19 DIAGNOSIS — Z23 Encounter for immunization: Secondary | ICD-10-CM

## 2019-11-19 NOTE — Progress Notes (Signed)
   Covid-19 Vaccination Clinic  Name:  MOURAD CWIKLA    MRN: [id number] DOB: [birth year]  11/19/2019  Mr. Hansell was observed post Covid-19 immunization for 15 minutes without incident. He was provided with Vaccine Information Sheet and instruction to access the V-Safe system.   Mr. Stayer was instructed to call 911 with any severe reactions post vaccine: Marland Kitchen Difficulty breathing  . Swelling of face and throat  . A fast heartbeat  . A bad rash all over body  . Dizziness and weakness

## 2019-11-21 ENCOUNTER — Other Ambulatory Visit (HOSPITAL_COMMUNITY): Payer: Self-pay | Admitting: Cardiology

## 2019-11-22 ENCOUNTER — Other Ambulatory Visit (HOSPITAL_COMMUNITY): Payer: Self-pay | Admitting: Internal Medicine

## 2019-11-27 ENCOUNTER — Telehealth: Payer: Self-pay | Admitting: Hematology

## 2019-11-27 NOTE — Telephone Encounter (Signed)
Scheduled appt per 9/14 sch msg - pt wife aware of appt added

## 2019-12-03 ENCOUNTER — Encounter: Payer: Self-pay | Admitting: Hematology

## 2019-12-05 ENCOUNTER — Inpatient Hospital Stay: Payer: Medicare Other

## 2019-12-05 ENCOUNTER — Ambulatory Visit (HOSPITAL_COMMUNITY): Admission: RE | Admit: 2019-12-05 | Payer: Medicare Other | Source: Ambulatory Visit

## 2019-12-06 ENCOUNTER — Inpatient Hospital Stay: Payer: Medicare Other | Admitting: Hematology

## 2020-01-10 ENCOUNTER — Telehealth: Payer: Self-pay

## 2020-01-10 NOTE — Telephone Encounter (Signed)
Logan [name] called stating they are having difficulty getting the alectinib from the [facility] mail delivery pharmacy.  Logan [name] gets this medication from the manufacturer.  I have sent [name] message to Logan [name] to see if there is another pharmacy they can use.  I updated Logan [name] on this.  She verbalized understanding.

## 2020-01-15 ENCOUNTER — Other Ambulatory Visit: Payer: Self-pay

## 2020-01-15 DIAGNOSIS — C3491 Malignant neoplasm of unspecified part of right bronchus or lung: Secondary | ICD-10-CM

## 2020-01-15 MED ORDER — ALECENSA 150 MG PO CAPS: ORAL_CAPSULE | ORAL | 4 refills | Status: DC

## 2020-01-15 NOTE — Progress Notes (Signed)
Logan [name] gets his alecensa via [employer].  He gets this medication via the [employer] patient assistance program.

## 2020-01-31 ENCOUNTER — Ambulatory Visit (HOSPITAL_COMMUNITY): Payer: Medicare Other

## 2020-01-31 ENCOUNTER — Inpatient Hospital Stay: Payer: Medicare Other

## 2020-01-31 ENCOUNTER — Telehealth: Payer: Self-pay

## 2020-01-31 NOTE — Telephone Encounter (Signed)
I received a call from [name] in the [facility] radiology department.  [name] cancelled his Ct scan appt on 02/04/2020.  H stated he would call to rescheduled.  I have cancelled his f/u with Dr Burr Medico scheduled to review CT scan results.

## 2020-02-03 ENCOUNTER — Inpatient Hospital Stay: Payer: Medicare Other | Admitting: Hematology

## 2020-02-03 ENCOUNTER — Other Ambulatory Visit: Payer: Medicare Other

## 2020-02-03 ENCOUNTER — Ambulatory Visit: Payer: Medicare Other | Admitting: Hematology

## 2020-02-04 ENCOUNTER — Ambulatory Visit (HOSPITAL_COMMUNITY): Payer: Medicare Other

## 2020-02-07 ENCOUNTER — Other Ambulatory Visit (HOSPITAL_COMMUNITY): Payer: Self-pay | Admitting: Cardiology

## 2020-02-19 ENCOUNTER — Other Ambulatory Visit (HOSPITAL_COMMUNITY): Payer: Self-pay | Admitting: Cardiology

## 2020-02-20 ENCOUNTER — Inpatient Hospital Stay: Payer: Medicare Other | Attending: Hematology

## 2020-02-20 ENCOUNTER — Ambulatory Visit (HOSPITAL_COMMUNITY)
Admission: RE | Admit: 2020-02-20 | Discharge: 2020-02-20 | Disposition: A | Discharge status: Home / Self Care | Payer: Medicare Other | Source: Ambulatory Visit | Attending: Hematology | Admitting: Hematology

## 2020-02-20 ENCOUNTER — Other Ambulatory Visit: Payer: Self-pay

## 2020-02-20 DIAGNOSIS — C3431 Malignant neoplasm of lower lobe, right bronchus or lung: Secondary | ICD-10-CM | POA: Insufficient documentation

## 2020-02-20 DIAGNOSIS — N183 Chronic kidney disease, stage 3 unspecified: Secondary | ICD-10-CM | POA: Diagnosis not present

## 2020-02-20 DIAGNOSIS — Z8674 Personal history of sudden cardiac arrest: Secondary | ICD-10-CM | POA: Insufficient documentation

## 2020-02-20 DIAGNOSIS — C3491 Malignant neoplasm of unspecified part of right bronchus or lung: Secondary | ICD-10-CM | POA: Insufficient documentation

## 2020-02-20 DIAGNOSIS — Z8673 Personal history of transient ischemic attack (TIA), and cerebral infarction without residual deficits: Secondary | ICD-10-CM | POA: Insufficient documentation

## 2020-02-20 DIAGNOSIS — C61 Malignant neoplasm of prostate: Secondary | ICD-10-CM | POA: Insufficient documentation

## 2020-02-20 DIAGNOSIS — Z79899 Other long term (current) drug therapy: Secondary | ICD-10-CM | POA: Diagnosis not present

## 2020-02-20 DIAGNOSIS — Z7901 Long term (current) use of anticoagulants: Secondary | ICD-10-CM | POA: Diagnosis not present

## 2020-02-20 DIAGNOSIS — I480 Paroxysmal atrial fibrillation: Secondary | ICD-10-CM | POA: Diagnosis not present

## 2020-02-20 DIAGNOSIS — I130 Hypertensive heart and chronic kidney disease with heart failure and stage 1 through stage 4 chronic kidney disease, or unspecified chronic kidney disease: Secondary | ICD-10-CM | POA: Diagnosis not present

## 2020-02-20 DIAGNOSIS — C787 Secondary malignant neoplasm of liver and intrahepatic bile duct: Secondary | ICD-10-CM | POA: Diagnosis not present

## 2020-02-20 DIAGNOSIS — C3490 Malignant neoplasm of unspecified part of unspecified bronchus or lung: Secondary | ICD-10-CM

## 2020-02-20 DIAGNOSIS — J90 Pleural effusion, not elsewhere classified: Secondary | ICD-10-CM | POA: Diagnosis not present

## 2020-02-20 DIAGNOSIS — I5022 Chronic systolic (congestive) heart failure: Secondary | ICD-10-CM | POA: Insufficient documentation

## 2020-02-20 DIAGNOSIS — C771 Secondary and unspecified malignant neoplasm of intrathoracic lymph nodes: Secondary | ICD-10-CM | POA: Diagnosis not present

## 2020-02-20 LAB — CBC WITH DIFFERENTIAL (CANCER CENTER ONLY)
Abs Immature Granulocytes: 0.02 10*3/uL (ref 0.00–0.07)
Basophils Absolute: 0.1 10*3/uL (ref 0.0–0.1)
Basophils Relative: 1 %
Eosinophils Absolute: 0.1 10*3/uL (ref 0.0–0.5)
Eosinophils Relative: 2 %
HCT: 35.4 % — ABNORMAL LOW (ref 39.0–52.0)
Hemoglobin: 12.2 g/dL — ABNORMAL LOW (ref 13.0–17.0)
Immature Granulocytes: 0 %
Lymphocytes Relative: 50 %
Lymphs Abs: 3.1 10*3/uL (ref 0.7–4.0)
MCH: 31 pg (ref 26.0–34.0)
MCHC: 34.5 g/dL (ref 30.0–36.0)
MCV: 89.8 fL (ref 80.0–100.0)
Monocytes Absolute: 0.6 10*3/uL (ref 0.1–1.0)
Monocytes Relative: 9 %
Neutro Abs: 2.3 10*3/uL (ref 1.7–7.7)
Neutrophils Relative %: 38 %
Platelet Count: 133 10*3/uL — ABNORMAL LOW (ref 150–400)
RBC: 3.94 MIL/uL — ABNORMAL LOW (ref 4.22–5.81)
RDW: 13.2 % (ref 11.5–15.5)
WBC Count: 6.2 10*3/uL (ref 4.0–10.5)
nRBC: 0 % (ref 0.0–0.2)

## 2020-02-20 LAB — CMP (CANCER CENTER ONLY)
ALT: 13 U/L (ref 0–44)
AST: 18 U/L (ref 15–41)
Albumin: 4.4 g/dL (ref 3.5–5.0)
Alkaline Phosphatase: 53 U/L (ref 38–126)
Anion gap: 10 (ref 5–15)
BUN: 32 mg/dL — ABNORMAL HIGH (ref 8–23)
CO2: 28 mmol/L (ref 22–32)
Calcium: 9.7 mg/dL (ref 8.9–10.3)
Chloride: 101 mmol/L (ref 98–111)
Creatinine: 1.74 mg/dL — ABNORMAL HIGH (ref 0.61–1.24)
GFR, Estimated: 42 mL/min — ABNORMAL LOW (ref >60)
Glucose, Bld: 95 mg/dL (ref 70–99)
Potassium: 3.9 mmol/L (ref 3.5–5.1)
Sodium: 139 mmol/L (ref 135–145)
Total Bilirubin: 0.6 mg/dL (ref 0.3–1.2)
Total Protein: 7.8 g/dL (ref 6.5–8.1)

## 2020-02-21 NOTE — Progress Notes (Signed)
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 Cardiomegaly with diffuse increased interstitial markings consistent with pulmonary edema. 2. 5-6 cm rounded density seen in the right lower lobe medially. This could be related to an area of consolidation correlate clinically for pneumonia.. But the rounded and well-defined borders are suspicious for mass. Followup x-ray suggested after  treating for the pulmonary edema/pneumonia to see if this resolves.   04/30/2014 Imaging   04/30/2014 CXR IMPRESSION: Enlarging right lower lobe nodule. Would recommend PET CT to further evaluate.   01/20/2015 Imaging   01/20/2015 CXR IMPRESSION: Significant improvement comparatively, now with smaller pleural effusions and resolving pulmonary edema. Known right lower lobe mass suspicious for bronchogenic carcinoma is redemonstrated.   04/07/2015 PET scan   04/07/2015 PET Scan IMPRESSION:  Hypermetabolic right lower lobe mass Hypermetabolic left adrenal nodule.   08/21/2015 Imaging   08/21/2015 CXR IMPRESSION: Right lower lobe mass.   09/22/2017 Imaging   09/22/2017 CXR IMPRESSION: 1.Moderate right pleural effusion with right lower/middle lobe consolidation.  Minimal patchy opacities in left lower lobe. 2.Cardiomegaly.   09/26/2017 Imaging   09/26/2017 CT Chest WO Contrast IMPRESSION: 1. Large RIGHT lower lobe mass with central and peripheral calcifications occupies the near entirety of the RIGHT lower lobe and concerning for neoplasm. 2. Moderate RIGHT effusion. 3. Mild mediastinal and RIGHT hilar adenopathy. 4. Recommend FDG PET scan for biopsy planning/staging   09/26/2017 Tumor Marker    Ref. Range 09/26/2017 21:31  Prostatic Specific Antigen Latest Ref Range: 0.00 - 4.00 ng/mL 84.83 (H)      09/27/2017 Imaging   09/27/2017 CT AP W WO Contrast IMPRESSION: 1. Large complex masses in the right lower lobe of the lung and in the right hepatic lobe. Smaller liver lesions in the left hepatic lobe. Nonspecific lesions in the spleen and kidneys. No significant pathologic adenopathy. This would not be a characteristic metastatic pattern for prostate cancer and is not a specific metastatic and imaging pattern to suggest an individual etiology. Possibilities might include metastatic lung cancer, melanoma, metastatic hepatocellular carcinoma, or a variety of other possibilities. Tissue diagnosis recommended. 2. Moderate prostatomegaly. 3. Scattered mild ascites. 4. Moderate cardiomegaly.  Small inferior pericardial effusion. 5. Trace bilateral pleural effusions.   10/02/2017 Pathology Results   10/02/2017 Surgical Pathology Diagnosis Liver, needle/core biopsy, Left Hepatic Lobe - METASTATIC LUNG ADENOCARCINOMA TO THE LIVER. SEE NOTE   10/02/2017 Cancer Staging   Staging form: Lung, AJCC 8th Edition - Clinical stage from 10/02/2017: Stage IV (cT3, cN2, cM1c) - Signed by Truitt Merle, MD on 10/20/2017   10/04/2017 Initial Diagnosis   Non-small cell lung cancer (NSCLC) ([place])   10/21/2017 -  Antibody Plan   Alectinib $RemoveBe'600mg'[id number]$  bid started on 10/21/2017   01/10/2018 PET scan   01/10/2018 PET Scan IMPRESSION:  1. Marked reduction in size of the right  lower lobe mass with fairly low metabolic activity in the residual mass, maximum SUV 5.3. Notably reduced thoracic adenopathy which is not currently hypermetabolic. 2. Marked reduction in size and conspicuity of the hepatic tumors, both of which have metabolic activity similar to that of the background liver parenchyma. 3. Resolution of the splenic mass. No additional or new lesions are identified. 4. Accentuated activity anteriorly in the left seventh rib, low-grade with maximum SUV 3.5, without appreciable CT abnormality on the CT data, significance uncertain. 5. Accentuated activity at the right sternoclavicular joint, SUV 7.2, probably from arthropathy. 6. Accentuated activity in the posterior and apical portions of the prostate gland, significance uncertain. 7. Other imaging findings of potential clinical significance: Chronic left maxillary sinusitis.   10/03/2018 Imaging   CT CAP W contrast 10/03/18 IMPRESSION: 1. No new or progressive findings on today's study. 2. Continued further slight reduction in size of the right lower mass. No lymphadenopathy in the chest. 3. Continued further decrease in size of liver lesions with a left hepatic lesion noted previously not apparent on today's study. 4. Stable 6 mm ill-defined subpleural nodule right upper lobe. Continued attention on follow-up recommended.   05/22/2019 Imaging   CT CAP WO Contrast  IMPRESSION: 1. Unchanged post treatment consolidation and calcification of the anterior right lower lobe. 2. Unchanged capsular retraction of the anterior liver dome, a hypodense mass at this site on prior examinations not discretely appreciated. 3. No noncontrast evidence of new intrathoracic or intra-abdominal metastatic disease. 4. Unchanged subpleural ground-glass nodule of the right upper lobe measuring 6 mm. An indolent lung malignancy is not excluded. Continued attention on follow-up. 5. Prostatomegaly.      Imaging         CURRENT THERAPY:  Alectinib $RemoveBe'600mg'[id number]$ BIDstarted on 10/21/2017  INTERVAL HISTORY:  Logan [name] is here for a follow up of metastatic lung cancer. He presents to the clinic with his wife. He is doing well. I reviewed his medication list with him. He is no longer on Farxiga, Trazodone. He notes his heart is doing better. He is active and walks often. He is tolerating Alectinib well except constipation.     REVIEW OF SYSTEMS:   Constitutional: Denies fevers, chills or abnormal weight loss Eyes: Denies blurriness of vision Ears, nose, mouth, throat, and face: Denies mucositis or sore throat Respiratory: Denies cough, dyspnea or wheezes Cardiovascular: Denies palpitation, chest discomfort or lower extremity swelling Gastrointestinal:  Denies nausea, heartburn (+) constipation Skin: Denies abnormal skin rashes Lymphatics: Denies new lymphadenopathy or easy bruising Neurological:Denies numbness, tingling or new weaknesses Behavioral/Psych: Mood is stable, no new changes  All other systems were reviewed with the patient and are negative.  MEDICAL HISTORY:  Past Medical History:  Diagnosis Date  . CHF (congestive heart failure) ([place])   . Chronic systolic heart failure ([place]) 04/15/2015    EF 30-35% by echo 2016 , [facility]   . Hypertension   . Lung mass 04/15/2015    Never biopsied. History of PET scan that raised concern.   . Metabolic syndrome 05/14/6710  . OSA on CPAP   . Paroxysmal atrial fibrillation ([place]) 04/15/2015   On apixaban   . Prostate cancer ([place])   . Thrombocytopenia ([place]) 04/15/2015    SURGICAL HISTORY: Past Surgical History:  Procedure Laterality Date  . CARDIAC CATHETERIZATION N/A 04/27/2015   Procedure: Right/Left Heart Cath and Coronary Angiography;  Surgeon: [name], MD;  Location: [facility];  Service: Cardiovascular;  Laterality: N/A;  . CARDIOVERSION N/A 09/29/2017   Procedure: CARDIOVERSION;  Surgeon: Pixie Casino, MD;  Location: [facility];  Service: Cardiovascular;  Laterality: N/A;  . IR THORACENTESIS ASP PLEURAL SPACE W/IMG GUIDE  09/27/2017  . PROSTATE BIOPSY    . TEE WITHOUT CARDIOVERSION N/A 09/29/2017   Procedure: TRANSESOPHAGEAL ECHOCARDIOGRAM (TEE);  Surgeon: Pixie Casino, MD;  Location: [facility] ENDOSCOPY;  Service: Cardiovascular;  Laterality: N/A;    I have reviewed the social history and family history with the patient and they are unchanged from previous note.  ALLERGIES:  has No Known Allergies.  MEDICATIONS:  Current Outpatient Medications  Medication Sig Dispense Refill  . alectinib (ALECENSA) 150 MG capsule TAKE 4 CAPSULES BY MOUTH TWICE A DAY WITH A MEAL 240 capsule 4  . amiodarone (PACERONE) 100 MG tablet Take 1 tablet (100 mg total) by mouth daily. Needs appt for further refills 90 tablet 0  . carvedilol (COREG) 6.25 MG tablet TAKE 1 TABLET TWICE DAILY WITH MEALS 180 tablet 3  . Cholecalciferol ([employer] VIT D 5000 HIGH-POTENCY) 5000 units capsule Take 5,000 Units by mouth 2 (two) times daily.     Marland Kitchen ELIQUIS 5 MG TABS tablet TAKE ONE TABLET BY MOUTH TWICE A DAY 60 tablet 11  . furosemide (LASIX) 80 MG tablet TAKE 1 TABLET EVERY DAY (NEED MD APPOINTMENT FOR REFILLS) 60 tablet 3  . Multiple Vitamin (MULTIVITAMIN) tablet Take 1 tablet by mouth daily.    . potassium chloride SA (KLOR-CON) 20 MEQ tablet Take 1 tablet (20 mEq total) by mouth daily. 90 tablet 1  . sacubitril-valsartan (ENTRESTO) 24-26 MG Take 1 tablet by mouth 2 (two) times daily. 60 tablet 3  . spironolactone (ALDACTONE) 25 MG tablet TAKE 1/2 TABLET EVERY DAY 45 tablet 3   No current facility-administered medications for this visit.    PHYSICAL EXAMINATION: ECOG PERFORMANCE STATUS: 1 - Symptomatic but completely ambulatory  Vitals:   02/24/20 1355  BP: 93/68  Pulse: 74  Resp: 17  Temp: 97.9 F (36.6 C)  SpO2: 98%   Filed Weights   02/24/20 1355  Weight: 234 lb 4.8 oz (106.3 kg)    Due to COVID19 we will limit examination to  appearance. Patient had no complaints.  GENERAL:alert, no distress and comfortable SKIN: skin color normal, no rashes or significant lesions EYES: normal, Conjunctiva are pink and non-injected, sclera clear  NEURO: alert & oriented x 3 with fluent speech   LABORATORY DATA:  I have reviewed the data as listed CBC Latest Ref Rng & Units 02/20/2020 11/06/2019 09/27/2019  WBC 4.0 - 10.5 K/uL 6.2 4.6 5.0  Hemoglobin 13.0 - 17.0 g/dL 12.2(L) 11.6(L) 12.1(L)  Hematocrit 39.0 - 52.0 % 35.4(L) 33.0(L) 35.0(L)  Platelets 150 - 400 K/uL 133(L) 142(L) 148(L)     CMP Latest Ref Rng & Units 02/20/2020 11/06/2019 09/27/2019  Glucose 70 - 99 mg/dL 95 104(H) 99  BUN 8 - 23 mg/dL 32(H) 27(H) 27(H)  Creatinine 0.61 - 1.24 mg/dL 1.74(H) 1.83(H) 2.19(H)  Sodium 135 - 145 mmol/L 139 140 137  Potassium 3.5 - 5.1 mmol/L 3.9 4.1 4.3  Chloride 98 - 111 mmol/L 101 106 102  CO2 22 - 32 mmol/L $RemoveB'28 26 26  'UDQliizo$ Calcium 8.9 - 10.3 mg/dL 9.7 9.7 9.1  Total Protein 6.5 - 8.1 g/dL 7.8 7.4 6.8  Total Bilirubin 0.3 - 1.2 mg/dL 0.6 0.5 0.5  Alkaline Phos 38 - 126 U/L 53 51 50  AST 15 - 41 U/L  18 18 21   ALT 0 - 44 U/L 13 13 19       RADIOGRAPHIC STUDIES: I have personally reviewed the radiological images as listed and agreed with the findings in the report. No results found.   ASSESSMENT & PLAN:  Logan [name] is a 69 y.o. male with    1. 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 -Due to his advanced heart failure, he isnota good candidate for intensive chemotherapy. -tumor NGS showed ALK translocation (+) -He started Alectinib on 10/21/2017, tolerating very wellwith manageable constipation  -His CT CAP from 02/20/20 showed Stable post treatment changes in the RIGHT lower lobe with associated volume loss. Ground-glass nodule RIGHT  upper lobe is stable. No other evidence of disease.  I personally reviewed his scan with patient today.  -He is clinically doing well. Labs reviewed from last week, CBC and CMP WNL except Hg 12.2, plt 133K, Cr 1.74. ContinueAlectinib600mg  BID. He is tolerating well.  -We again discussed the incurable nature of his metastatic cancer, and the likely progress to alectinib down the road, will continue follow-up closely. -F/u in 3 months. Repeat scan in 6 months, or sooner if there is clinical concern for cancer progression.   2.History of untreated prostate cancer, with rising PSA -will f/u PSA -Prostatomegaly seen on CT CAP from 05/22/19 -He has not had biopsy.we again discussed diagnostic work-up, he declined further workup and conventional treatment for this.   3.Bilateral pleural effusion,R>L, likely related to CHF -resolved now  -On Lasix.   4.Cardiomyopathy with EF 10 to 15%, history of cardiac arrest -TTE/TEEpreviouslyshowed decreased ejection fraction to 10 to 15% -He is not a candidate for advanced cardiac therapiesdue to his stage IV lung cancer -His heart condition has improved with life style change.  -F/u with cardiologistDr. Morton Amy Dr. Tamala Julian  5. Atrial Fibrillation -Was on Xarelto but off due to hemoptysis -Continue aspirin 81 and follow-up with cardiology to decide on antiplatelet versus anticoagulation -OnEliquis 5 mg  6. Incidentalstroke:leftcerebellar smallinfarct -Resultantno neurological focal deficit -On Xarelto (rivaroxaban) dailyprior to admission (off due to hemoptysis), now onaspirin 81 mg daily.  7. Goal of care discussion  -The patient understands the goal of care is palliative. -He is full code now   8.CKD stage III -stable, continue monitoring  Plan: -He is clinically doing well -ContinueAlectinib600mg  BID -Lab and f/u in 3 months, will order restaging scan on next visit   No problem-specific Assessment & Plan  notes found for this encounter.   No orders of the defined types were placed in this encounter.  All questions were answered. The patient knows to call the clinic with any problems, questions or concerns. No barriers to learning was detected. The total time spent in the appointment was 30 minutes.     Truitt Merle, MD 02/24/2020   I, Joslyn Devon, am acting as scribe for Truitt Merle, MD.   I have reviewed the above documentation for accuracy and completeness, and I agree with the above.

## 2020-02-24 ENCOUNTER — Encounter: Payer: Self-pay | Admitting: Hematology

## 2020-02-24 ENCOUNTER — Other Ambulatory Visit: Payer: Self-pay

## 2020-02-24 ENCOUNTER — Telehealth: Payer: Self-pay | Admitting: Hematology

## 2020-02-24 ENCOUNTER — Inpatient Hospital Stay (HOSPITAL_BASED_OUTPATIENT_CLINIC_OR_DEPARTMENT_OTHER): Payer: Medicare Other | Admitting: Hematology

## 2020-02-24 VITALS — BP 93/68 | HR 74 | Temp 97.9°F | Resp 17 | Ht 71.0 in | Wt 234.3 lb

## 2020-02-24 DIAGNOSIS — I428 Other cardiomyopathies: Secondary | ICD-10-CM

## 2020-02-24 DIAGNOSIS — C61 Malignant neoplasm of prostate: Secondary | ICD-10-CM | POA: Diagnosis not present

## 2020-02-24 DIAGNOSIS — C3491 Malignant neoplasm of unspecified part of right bronchus or lung: Secondary | ICD-10-CM

## 2020-02-24 DIAGNOSIS — I480 Paroxysmal atrial fibrillation: Secondary | ICD-10-CM

## 2020-02-24 DIAGNOSIS — C3431 Malignant neoplasm of lower lobe, right bronchus or lung: Secondary | ICD-10-CM | POA: Diagnosis not present

## 2020-02-24 NOTE — Telephone Encounter (Signed)
Scheduled per los. Gave avs and calendar  

## 2020-03-27 ENCOUNTER — Other Ambulatory Visit (HOSPITAL_COMMUNITY): Payer: Self-pay | Admitting: Cardiology

## 2020-03-31 ENCOUNTER — Telehealth (HOSPITAL_COMMUNITY): Payer: Self-pay | Admitting: Pharmacy Technician

## 2020-03-31 ENCOUNTER — Telehealth (HOSPITAL_COMMUNITY): Payer: Self-pay | Admitting: *Deleted

## 2020-03-31 MED ORDER — APIXABAN 5 MG PO TABS: 5.0000 mg | ORAL_TABLET | Freq: Two times a day (BID) | ORAL | 11 refills | Status: AC

## 2020-03-31 NOTE — Telephone Encounter (Signed)
Medication Samples have been provided to the patient.  Drug name: Eliquis       Strength: 5 mg        Qty: 4  LOT: [id number]  Exp.Date: 4/23  Dosing instructions: Take 1 tab Twice daily   The patient has been instructed regarding the correct time, dose, and frequency of taking this medication, including desired effects and most common side effects.   Logan [name] 2:21 PM 03/31/2020   Medication Samples have been provided to the patient.  Drug name: Delene Loll       Strength: 24/26mg         Qty: 2  LOT: [id number]  Exp.Date: 4/24  Dosing instructions: take 1 tab Twice daily   The patient has been instructed regarding the correct time, dose, and frequency of taking this medication, including desired effects and most common side effects.   Logan [name] 2:21 PM 03/31/2020

## 2020-03-31 NOTE — Telephone Encounter (Signed)
Spoke with patient's wife today. His current 30 day co-pay of Entresto and Eliquis are between $130-$150 a piece. Sent [employer] and BMS application to [employer]. She is aware that they have to spend 3% OOP on RXs in order to qualify for Eliquis assistance.   Heather, RN is going to have samples of both medications for the patient to pick up.  Will fax in applications once all signatures are obtained.

## 2020-04-15 ENCOUNTER — Telehealth: Payer: Self-pay

## 2020-04-15 NOTE — Telephone Encounter (Signed)
[name] called stating that Logan [name]'s left hand is painfull and swollen.  I told her to contact his PCP.  She verbalized understanding.

## 2020-04-28 ENCOUNTER — Other Ambulatory Visit: Payer: Self-pay | Admitting: Internal Medicine

## 2020-05-19 ENCOUNTER — Telehealth: Payer: Self-pay | Admitting: Hematology

## 2020-05-19 NOTE — Telephone Encounter (Signed)
Called pt per MD schedule change. Left message for patient with appt date and time. And call back number for pt if reschedule does not work

## 2020-05-20 ENCOUNTER — Other Ambulatory Visit: Payer: Self-pay

## 2020-05-20 DIAGNOSIS — C3491 Malignant neoplasm of unspecified part of right bronchus or lung: Secondary | ICD-10-CM

## 2020-05-20 MED ORDER — ALECENSA 150 MG PO CAPS: ORAL_CAPSULE | ORAL | 6 refills | Status: DC

## 2020-05-22 NOTE — Progress Notes (Incomplete)
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 Cardiomegaly with diffuse increased interstitial markings consistent with pulmonary edema. 2. 5-6 cm rounded density seen in the right lower lobe medially. This could be related to an area of consolidation correlate clinically for pneumonia.. But the rounded and well-defined borders are suspicious for mass. Followup x-ray suggested after  treating for the pulmonary edema/pneumonia to see if this resolves.   04/30/2014 Imaging   04/30/2014 CXR IMPRESSION: Enlarging right lower lobe nodule. Would recommend PET CT to further evaluate.   01/20/2015 Imaging   01/20/2015 CXR IMPRESSION: Significant improvement comparatively, now with smaller pleural effusions and resolving pulmonary edema. Known right lower lobe mass suspicious for bronchogenic carcinoma is redemonstrated.   04/07/2015 PET scan   04/07/2015 PET Scan IMPRESSION:  Hypermetabolic right lower lobe mass Hypermetabolic left adrenal nodule.   08/21/2015 Imaging   08/21/2015 CXR IMPRESSION: Right lower lobe mass.   09/22/2017 Imaging   09/22/2017 CXR IMPRESSION: 1.Moderate right pleural effusion with right lower/middle lobe consolidation. Minimal  patchy opacities in left lower lobe. 2.Cardiomegaly.   09/26/2017 Imaging   09/26/2017 CT Chest WO Contrast IMPRESSION: 1. Large RIGHT lower lobe mass with central and peripheral calcifications occupies the near entirety of the RIGHT lower lobe and concerning for neoplasm. 2. Moderate RIGHT effusion. 3. Mild mediastinal and RIGHT hilar adenopathy. 4. Recommend FDG PET scan for biopsy planning/staging   09/26/2017 Tumor Marker    Ref. Range 09/26/2017 21:31  Prostatic Specific Antigen Latest Ref Range: 0.00 - 4.00 ng/mL 84.83 (H)      09/27/2017 Imaging   09/27/2017 CT AP W WO Contrast IMPRESSION: 1. Large complex masses in the right lower lobe of the lung and in the right hepatic lobe. Smaller liver lesions in the left hepatic lobe. Nonspecific lesions in the spleen and kidneys. No significant pathologic adenopathy. This would not be a characteristic metastatic pattern for prostate cancer and is not a specific metastatic and imaging pattern to suggest an individual etiology. Possibilities might include metastatic lung cancer, melanoma, metastatic hepatocellular carcinoma, or a variety of other possibilities. Tissue diagnosis recommended. 2. Moderate prostatomegaly. 3. Scattered mild ascites. 4. Moderate cardiomegaly.  Small inferior pericardial effusion. 5. Trace bilateral pleural effusions.   10/02/2017 Pathology Results   10/02/2017 Surgical Pathology Diagnosis Liver, needle/core biopsy, Left Hepatic Lobe - METASTATIC LUNG ADENOCARCINOMA TO THE LIVER. SEE NOTE   10/02/2017 Cancer Staging   Staging form: Lung, AJCC 8th Edition - Clinical stage from 10/02/2017: Stage IV (cT3, cN2, cM1c) - Signed by Truitt Merle, MD on 10/20/2017   10/04/2017 Initial Diagnosis   Non-small cell lung cancer (NSCLC) ([place])   10/21/2017 -  Antibody Plan   Alectinib 647m bid started on 10/21/2017   01/10/2018 PET scan   01/10/2018 PET Scan IMPRESSION:  1. Marked reduction in size of the right lower  lobe mass with fairly low metabolic activity in the residual mass, maximum SUV 5.3. Notably reduced thoracic adenopathy which is not currently hypermetabolic. 2. Marked reduction in size and conspicuity of the hepatic tumors, both of which have metabolic activity similar to that of the background liver parenchyma. 3. Resolution of the splenic mass. No additional or new lesions are identified. 4. Accentuated activity anteriorly in the left seventh rib, low-grade with maximum SUV 3.5, without appreciable CT abnormality on the CT data, significance uncertain. 5. Accentuated activity at the right sternoclavicular joint, SUV 7.2, probably from arthropathy. 6. Accentuated activity in the posterior and apical portions of the prostate gland, significance uncertain. 7. Other imaging findings of potential clinical significance: Chronic left maxillary sinusitis.   10/03/2018 Imaging   CT CAP W contrast 10/03/18 IMPRESSION: 1. No new or progressive findings on today's study. 2. Continued further slight reduction in size of the right lower mass. No lymphadenopathy in the chest. 3. Continued further decrease in size of liver lesions with a left hepatic lesion noted previously not apparent on today's study. 4. Stable 6 mm ill-defined subpleural nodule right upper lobe. Continued attention on follow-up recommended.   05/22/2019 Imaging   CT CAP WO Contrast  IMPRESSION: 1. Unchanged post treatment consolidation and calcification of the anterior right lower lobe. 2. Unchanged capsular retraction of the anterior liver dome, a hypodense mass at this site on prior examinations not discretely appreciated. 3. No noncontrast evidence of new intrathoracic or intra-abdominal metastatic disease. 4. Unchanged subpleural ground-glass nodule of the right upper lobe measuring 6 mm. An indolent lung malignancy is not excluded. Continued attention on follow-up. 5. Prostatomegaly.      Imaging         CURRENT THERAPY:  Alectinib 636mBIDstarted on 10/21/2017   INTERVAL HISTORY: *** Logan [name] is here for a follow up of metastatic lung cancer. He presents to the clinic alone.    REVIEW OF SYSTEMS:  *** Constitutional: Denies fevers, chills or abnormal weight loss Eyes: Denies blurriness of vision Ears, nose, mouth, throat, and face: Denies mucositis or sore throat Respiratory: Denies cough, dyspnea or wheezes Cardiovascular: Denies palpitation, chest discomfort or lower extremity swelling Gastrointestinal:  Denies nausea, heartburn or change in bowel habits Skin: Denies abnormal skin rashes Lymphatics: Denies new lymphadenopathy or easy bruising Neurological:Denies numbness, tingling or new weaknesses Behavioral/Psych: Mood is stable, no new changes  All other systems were reviewed with the patient and are negative.  MEDICAL HISTORY:  Past Medical History:  Diagnosis Date  . CHF (congestive heart failure) ([place]   . Chronic systolic heart failure ([place] 04/15/2015    EF 30-35% by echo 2016 , [facility]   . Hypertension   . Lung mass 04/15/2015    Never biopsied. History of PET scan that raised concern.   . Metabolic syndrome 28/07/4625 . OSA on CPAP   . Paroxysmal atrial fibrillation ([place] 04/15/2015   On apixaban   . Prostate cancer ([facility]   . Thrombocytopenia ([place] 04/15/2015    SURGICAL HISTORY: Past Surgical History:  Procedure Laterality Date  . CARDIAC CATHETERIZATION N/A 04/27/2015   Procedure: Right/Left Heart Cath and Coronary Angiography;  Surgeon: HBelva Crome MD;  [facility];  Service: Cardiovascular;  Laterality: N/A;  . CARDIOVERSION N/A 09/29/2017   Procedure: CARDIOVERSION;  Surgeon: HPixie Casino MD;  [facility];  Service: Cardiovascular;  Laterality: N/A;  . IR THORACENTESIS ASP PLEURAL  SPACE W/IMG GUIDE  09/27/2017  . PROSTATE BIOPSY    . TEE WITHOUT CARDIOVERSION N/A 09/29/2017   Procedure: TRANSESOPHAGEAL  ECHOCARDIOGRAM (TEE);  Surgeon: Pixie Casino, MD;  [facility];  Service: Cardiovascular;  Laterality: N/A;    I have reviewed the social history and family history with the patient and they are unchanged from previous note.  ALLERGIES:  has No Known Allergies.  MEDICATIONS:  Current Outpatient Medications  Medication Sig Dispense Refill  . alectinib (ALECENSA) 150 MG capsule TAKE 4 CAPSULES BY MOUTH TWICE A DAY WITH A MEAL 240 capsule 6  . amiodarone (PACERONE) 100 MG tablet TAKE 1 TABLET BY MOUTH DAILY (NEED APPOINTMENT FOR FURTHER REFILLS) 90 tablet 0  . apixaban (ELIQUIS) 5 MG TABS tablet Take 1 tablet (5 mg total) by mouth 2 (two) times daily. 60 tablet 11  . carvedilol (COREG) 6.25 MG tablet TAKE 1 TABLET TWICE DAILY WITH MEALS 180 tablet 3  . Cholecalciferol ([employer] VIT D 5000 HIGH-POTENCY) 5000 units capsule Take 5,000 Units by mouth 2 (two) times daily.     Marland Kitchen ENTRESTO 24-26 MG TAKE ONE TABLET BY MOUTH TWICE A DAY 60 tablet 3  . furosemide (LASIX) 80 MG tablet TAKE 1 TABLET EVERY DAY (NEED MD APPOINTMENT FOR REFILLS) 60 tablet 3  . Multiple Vitamin (MULTIVITAMIN) tablet Take 1 tablet by mouth daily.    . potassium chloride SA (KLOR-CON) 20 MEQ tablet Take 1 tablet (20 mEq total) by mouth daily. 90 tablet 1  . spironolactone (ALDACTONE) 25 MG tablet TAKE 1/2 TABLET EVERY DAY 45 tablet 3   No current facility-administered medications for this visit.    PHYSICAL EXAMINATION: ECOG PERFORMANCE STATUS: {CHL ONC ECOG [phone]}  There were no vitals filed for this visit. There were no vitals filed for this visit. *** GENERAL:alert, no distress and comfortable SKIN: skin color, texture, turgor are normal, no rashes or significant lesions EYES: normal, Conjunctiva are pink and non-injected, sclera clear {OROPHARYNX:no exudate, no erythema and lips, buccal mucosa, and tongue normal}  NECK: supple, thyroid normal size, non-tender, without nodularity LYMPH:  no palpable  lymphadenopathy in the cervical, axillary {or inguinal} LUNGS: clear to auscultation and percussion with normal breathing effort HEART: regular rate & rhythm and no murmurs and no lower extremity edema ABDOMEN:abdomen soft, non-tender and normal bowel sounds Musculoskeletal:no cyanosis of digits and no clubbing  NEURO: alert & oriented x 3 with fluent speech, no focal motor/sensory deficits  LABORATORY DATA:  I have reviewed the data as listed CBC Latest Ref Rng & Units 02/20/2020 11/06/2019 09/27/2019  WBC 4.0 - 10.5 K/uL 6.2 4.6 5.0  Hemoglobin 13.0 - 17.0 g/dL 12.2(L) 11.6(L) 12.1(L)  Hematocrit 39.0 - 52.0 % 35.4(L) 33.0(L) 35.0(L)  Platelets 150 - 400 K/uL 133(L) 142(L) 148(L)     CMP Latest Ref Rng & Units 02/20/2020 11/06/2019 09/27/2019  Glucose 70 - 99 mg/dL 95 104(H) 99  BUN 8 - 23 mg/dL 32(H) 27(H) 27(H)  Creatinine 0.61 - 1.24 mg/dL 1.74(H) 1.83(H) 2.19(H)  Sodium 135 - 145 mmol/L 139 140 137  Potassium 3.5 - 5.1 mmol/L 3.9 4.1 4.3  Chloride 98 - 111 mmol/L 101 106 102  CO2 22 - 32 mmol/L _0 Calcium 8.9 - 10.3 mg/dL 9.7 9.7 9.1  Total Protein 6.5 - 8.1 g/dL 7.8 7.4 6.8  Total Bilirubin 0.3 - 1.2 mg/dL 0.6 0.5 0.5  Alkaline Phos 38 - 126 U/L 53 51 50  AST 15 - 41 U/L 18 18 21  ALT 0 - 44 U/L _0 RADIOGRAPHIC STUDIES: I have personally reviewed the radiological images as listed and agreed with the findings in the report. No results found.   ASSESSMENT & PLAN:  AADIN GAUT is a 70 y.o. male with     1. 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 -Due to his advanced heart failure, he isnota good candidate for intensive chemotherapy. -tumor NGS showed ALK translocation (+) -He started Alectinib on 10/21/2017, tolerating very wellwith manageable  constipation ***  -F/u in 3 months. Repeat scan in 6 months, or sooner if there is clinical concern for cancer progression.   2.History of untreated prostate cancer, with rising PSA -will f/u PSA -Prostatomegaly seen on CT CAP from 05/22/19 -He has not had biopsy.we again discussed diagnostic work-up, he declined further workup and conventional treatment for this.   3.Bilateral pleural effusion,R>L, likely related to CHF -resolved now  -On Lasix.   4.Cardiomyopathy with EF 10 to 15%, history of cardiac arrest -TTE/TEEpreviouslyshowed decreased ejection fraction to 10 to 15% -He is not a candidate for advanced cardiac therapiesdue to his stage IV lung cancer -His heart condition has improved with life style change.  -F/u with cardiologistDr. Morton Amy Dr. Tamala Julian  5. Atrial Fibrillation -Was on Xarelto but off due to hemoptysis -Continue aspirin 81 and follow-up with cardiology to decide on antiplatelet versus anticoagulation -OnEliquis 5 mg  6. Incidentalstroke:leftcerebellar smallinfarct -Resultantno neurological focal deficit -On Xarelto (rivaroxaban) dailyprior to admission (off due to hemoptysis), now onaspirin 81 mg daily.  7. Goal of care discussion -The patient understands the goal of care is palliative. -He is full code now   8.CKD stage III -stable, continue monitoring  Plan: *** -ContinueAlectinib665m BID   No problem-specific Assessment & Plan notes found for this encounter.   No orders of the defined types were placed in this encounter.  All questions were answered. The patient knows to call the clinic with any problems, questions or concerns. No barriers to learning was detected. The total time spent in the appointment was {CHL ONC TIME VISIT - SXTAVW:[id number]}     AJoslyn Devon3/01/2021   IOneal Deputy am acting as scribe for YTruitt Merle MD.   {Add scribe attestation statement}

## 2020-05-26 ENCOUNTER — Inpatient Hospital Stay: Payer: Medicare Other | Admitting: Hematology

## 2020-05-26 ENCOUNTER — Inpatient Hospital Stay: Payer: Medicare Other

## 2020-05-26 DIAGNOSIS — C61 Malignant neoplasm of prostate: Secondary | ICD-10-CM

## 2020-05-26 DIAGNOSIS — I428 Other cardiomyopathies: Secondary | ICD-10-CM

## 2020-05-26 DIAGNOSIS — C3491 Malignant neoplasm of unspecified part of right bronchus or lung: Secondary | ICD-10-CM

## 2020-05-26 DIAGNOSIS — I480 Paroxysmal atrial fibrillation: Secondary | ICD-10-CM

## 2020-05-27 ENCOUNTER — Telehealth: Payer: Self-pay | Admitting: Hematology

## 2020-05-27 ENCOUNTER — Ambulatory Visit: Payer: Medicare Other | Admitting: Hematology

## 2020-05-27 ENCOUNTER — Other Ambulatory Visit: Payer: Medicare Other

## 2020-05-27 NOTE — Telephone Encounter (Signed)
Called pt wife per 3/15 sh msg - no answer . And unable to leave message.   Called pt and scheduled apt . Pt is aware of apt date and time

## 2020-06-03 IMAGING — US US BIOPSY CORE LIVER
1 series · 13 of 13 positions shown · non-contrast
Comparison: none

INDICATION: 66-year-old with a suspicious right lung mass and indeterminate
liver lesions. Tissue diagnosis is needed.

[Series 1: us biopsy core liver · 0.17mm/px · 13 of 13 slices shown]
[im 1/13]
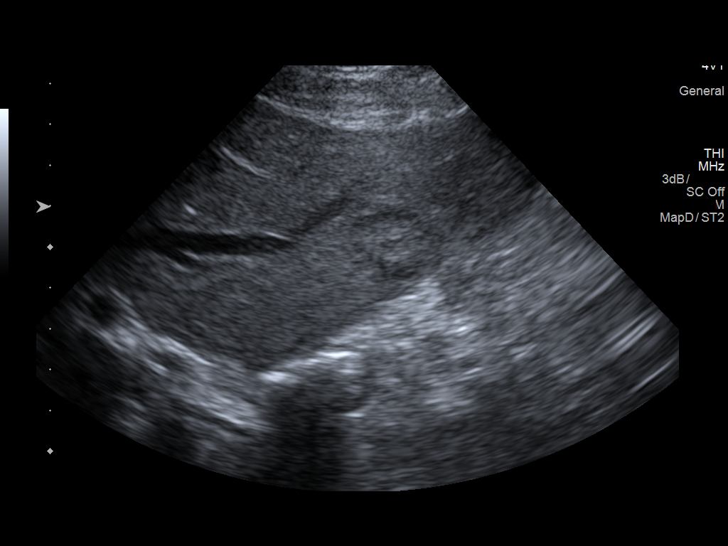
[im 2/13]
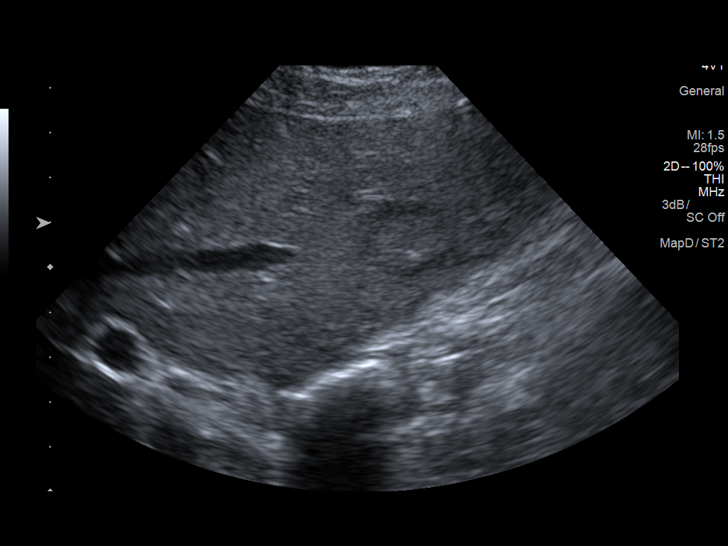
[im 3/13]
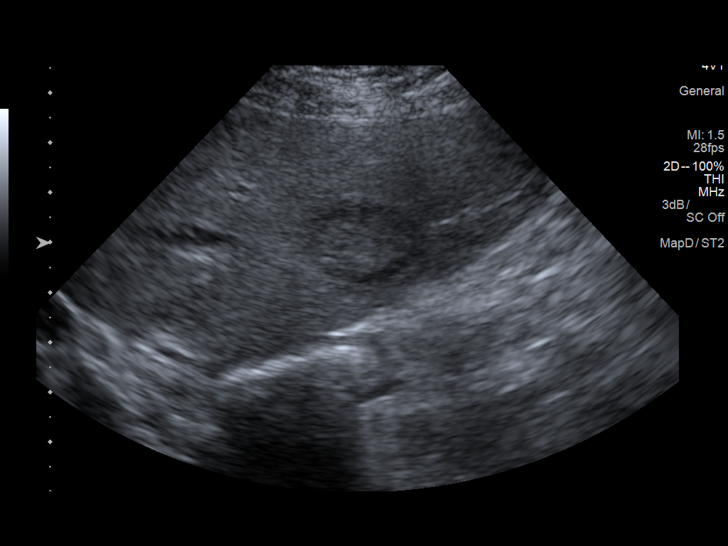
[im 4/13]
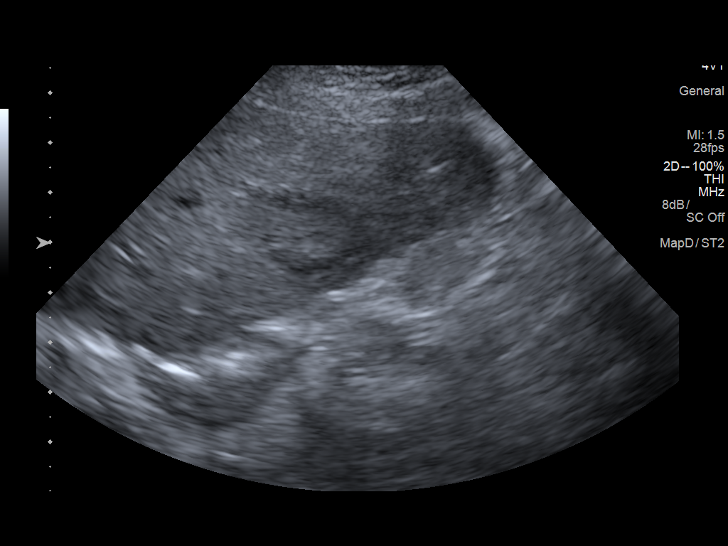
[im 5/13]
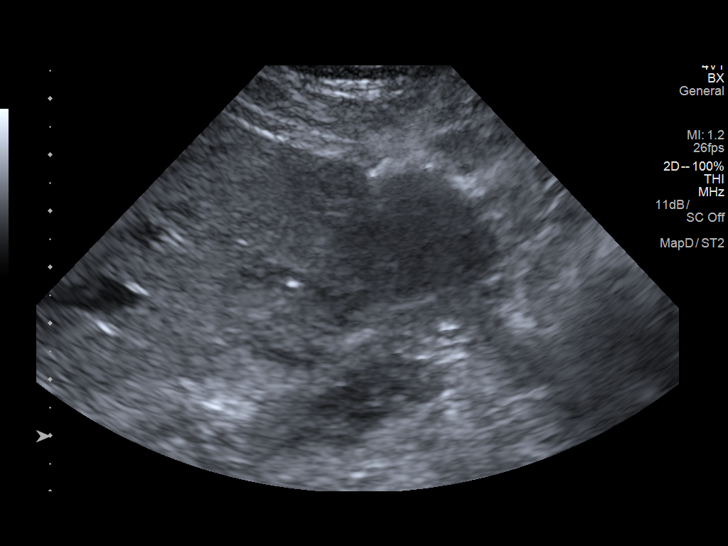
[im 6/13]
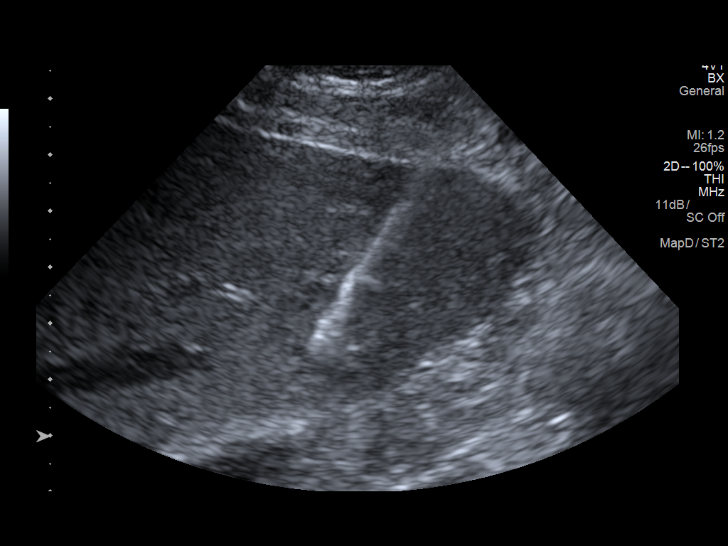
[im 7/13]
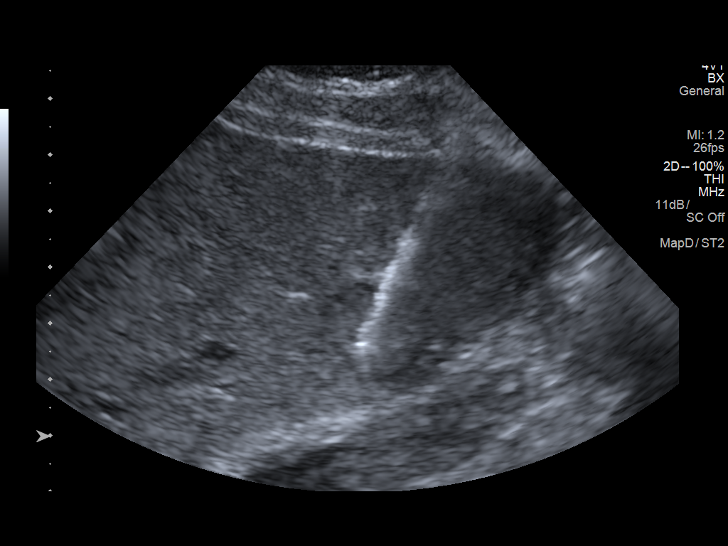
[im 8/13]
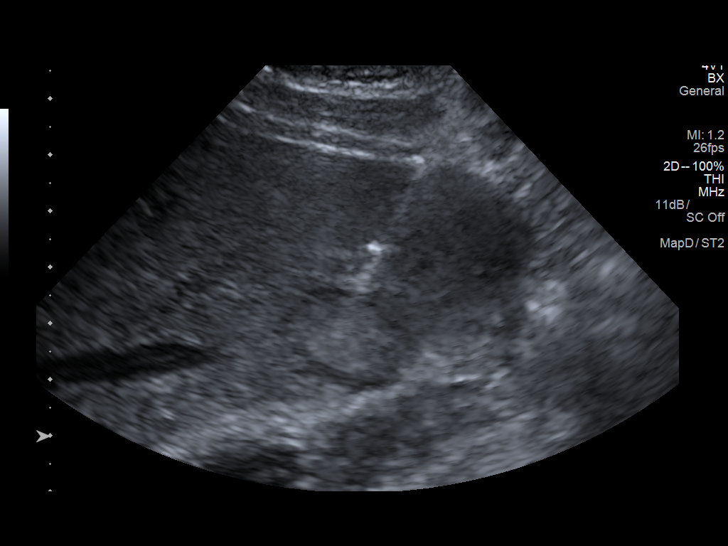
[im 9/13]
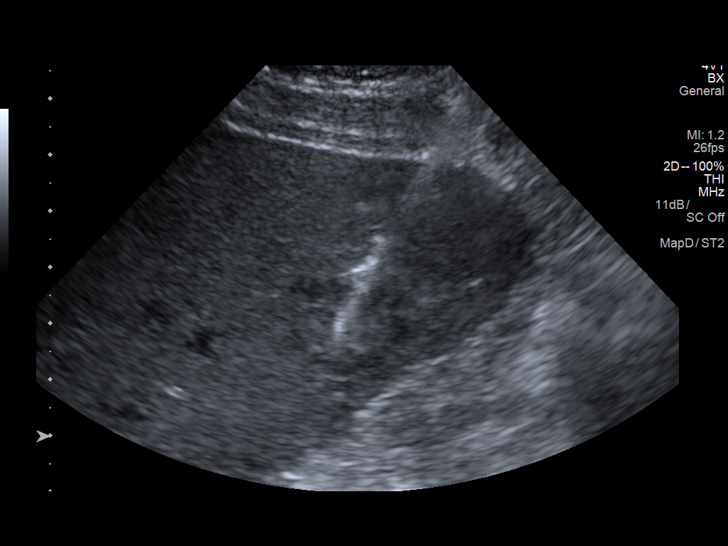
[im 10/13]
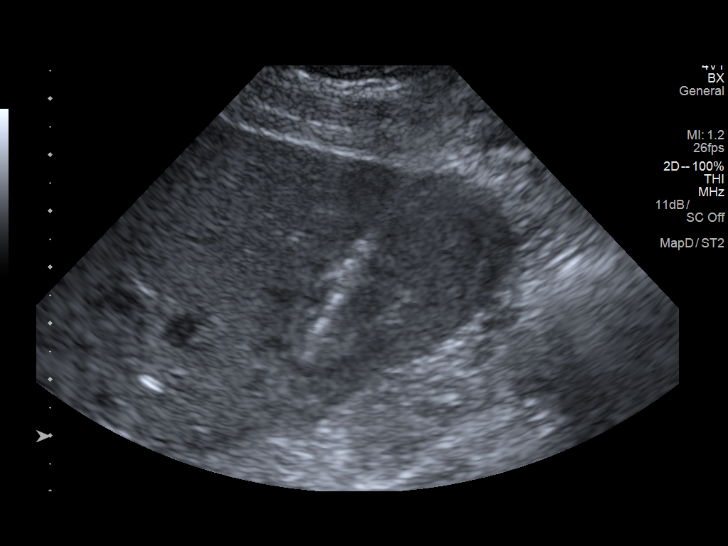
[im 11/13]
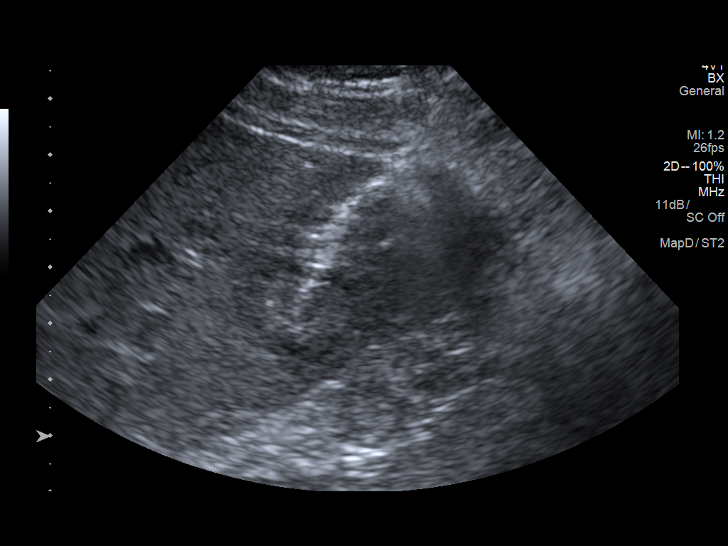
[im 12/13]
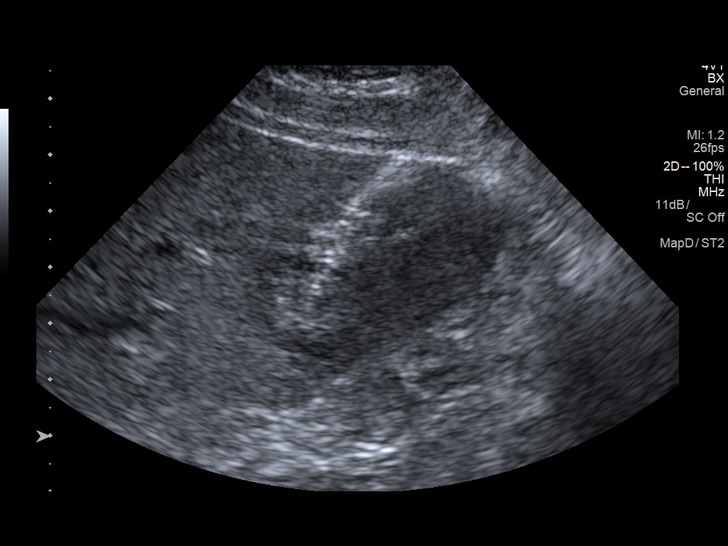
[im 13/13]
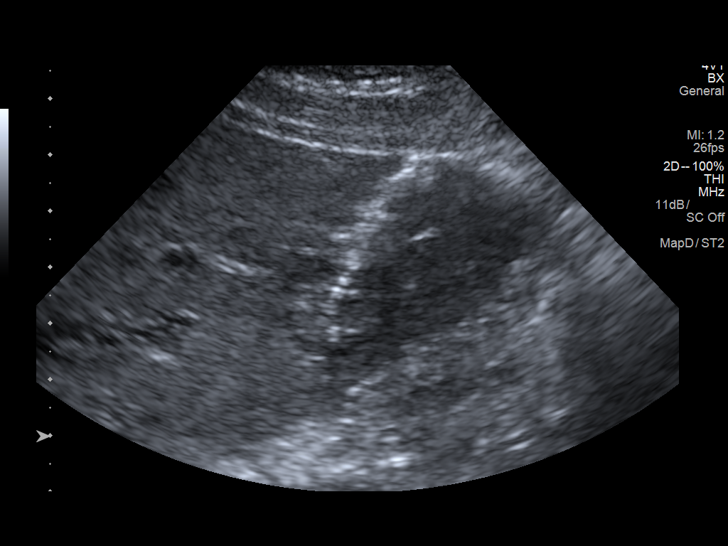

[13 of 13 positions shown; findings below may reference images not displayed]

EXAM:
ULTRASOUND-GUIDED CORE BIOPSY OF LIVER LESION

MEDICATIONS:
None.

ANESTHESIA/SEDATION:
Moderate (conscious) sedation was employed during this procedure. A
total of Versed 1 mg and Fentanyl 50 mcg was administered
intravenously.

Moderate Sedation Time: 14 minutes. The patient's level of
consciousness and vital signs were monitored continuously by
radiology nursing throughout the procedure under my direct
supervision.

FLUOROSCOPY TIME:  None

COMPLICATIONS:
None immediate.

PROCEDURE:
Informed written consent was obtained from the patient after a
thorough discussion of the procedural risks, benefits and
alternatives. All questions were addressed. A timeout was performed
prior to the initiation of the procedure.

The liver was evaluated with ultrasound. The lesion in the medial
left hepatic lobe, segment 4B,was targeted for biopsy. The anterior
abdomen was prepped chlorhexidine and sterile field was created.
Skin and soft tissues were anesthetized with 1% lidocaine. 17 gauge
coaxial needle was directed into the lesion with ultrasound
guidance. A total of 4 core biopsies were obtained with an 18 gauge
core device. Specimens placed in formalin. Gel-Foam slurry was
injected through the 17 gauge coaxial needle as it was removed.
Bandage placed over the puncture site.
FINDINGS: 2.5 cm lesion in the medial left hepatic lobe with a peripheral
hypoechoic rim. Patient has a larger lesion in the right hepatic
lobe but this is poorly marginated and more difficult to accurately
biopsy. Medial left hepatic lobe lesion was successfully targeted
and biopsied. No significant bleeding or hematoma formation
following the core biopsies.
IMPRESSION: Successful ultrasound-guided liver lesion biopsy.

## 2020-06-04 ENCOUNTER — Ambulatory Visit (HOSPITAL_BASED_OUTPATIENT_CLINIC_OR_DEPARTMENT_OTHER)
Admission: RE | Admit: 2020-06-04 | Discharge: 2020-06-04 | Disposition: A | Discharge status: Home / Self Care | Payer: Medicare Other | Source: Ambulatory Visit

## 2020-06-04 ENCOUNTER — Telehealth (HOSPITAL_COMMUNITY): Payer: Self-pay | Admitting: Pharmacy Technician

## 2020-06-04 ENCOUNTER — Encounter (HOSPITAL_COMMUNITY): Payer: Self-pay | Admitting: Cardiology

## 2020-06-04 ENCOUNTER — Ambulatory Visit (HOSPITAL_COMMUNITY)
Admission: RE | Admit: 2020-06-04 | Discharge: 2020-06-04 | Disposition: A | Discharge status: Home / Self Care | Payer: Medicare Other | Source: Ambulatory Visit | Attending: Cardiology | Admitting: Cardiology

## 2020-06-04 ENCOUNTER — Other Ambulatory Visit: Payer: Self-pay

## 2020-06-04 VITALS — BP 108/68 | HR 58 | Wt 238.2 lb

## 2020-06-04 DIAGNOSIS — I5022 Chronic systolic (congestive) heart failure: Secondary | ICD-10-CM

## 2020-06-04 DIAGNOSIS — I4891 Unspecified atrial fibrillation: Secondary | ICD-10-CM | POA: Diagnosis not present

## 2020-06-04 DIAGNOSIS — D696 Thrombocytopenia, unspecified: Secondary | ICD-10-CM | POA: Insufficient documentation

## 2020-06-04 DIAGNOSIS — G4730 Sleep apnea, unspecified: Secondary | ICD-10-CM | POA: Diagnosis not present

## 2020-06-04 DIAGNOSIS — I110 Hypertensive heart disease with heart failure: Secondary | ICD-10-CM | POA: Diagnosis present

## 2020-06-04 DIAGNOSIS — I340 Nonrheumatic mitral (valve) insufficiency: Secondary | ICD-10-CM | POA: Diagnosis not present

## 2020-06-04 DIAGNOSIS — Z859 Personal history of malignant neoplasm, unspecified: Secondary | ICD-10-CM | POA: Insufficient documentation

## 2020-06-04 LAB — COMPREHENSIVE METABOLIC PANEL
ALT: 20 U/L (ref 0–44)
AST: 24 U/L (ref 15–41)
Albumin: 4.6 g/dL (ref 3.5–5.0)
Alkaline Phosphatase: 51 U/L (ref 38–126)
Anion gap: 10 (ref 5–15)
BUN: 28 mg/dL — ABNORMAL HIGH (ref 8–23)
CO2: 27 mmol/L (ref 22–32)
Calcium: 9.3 mg/dL (ref 8.9–10.3)
Chloride: 100 mmol/L (ref 98–111)
Creatinine, Ser: 1.58 mg/dL — ABNORMAL HIGH (ref 0.61–1.24)
GFR, Estimated: 47 mL/min — ABNORMAL LOW (ref >60)
Glucose, Bld: 92 mg/dL (ref 70–99)
Potassium: 4.2 mmol/L (ref 3.5–5.1)
Sodium: 137 mmol/L (ref 135–145)
Total Bilirubin: 0.6 mg/dL (ref 0.3–1.2)
Total Protein: 7.8 g/dL (ref 6.5–8.1)

## 2020-06-04 LAB — CBC
HCT: 37.5 % — ABNORMAL LOW (ref 39.0–52.0)
Hemoglobin: 12.9 g/dL — ABNORMAL LOW (ref 13.0–17.0)
MCH: 31.2 pg (ref 26.0–34.0)
MCHC: 34.4 g/dL (ref 30.0–36.0)
MCV: 90.8 fL (ref 80.0–100.0)
Platelets: 139 10*3/uL — ABNORMAL LOW (ref 150–400)
RBC: 4.13 MIL/uL — ABNORMAL LOW (ref 4.22–5.81)
RDW: 13 % (ref 11.5–15.5)
WBC: 6.3 10*3/uL (ref 4.0–10.5)
nRBC: 0 % (ref 0.0–0.2)

## 2020-06-04 LAB — TSH: TSH: 2.492 u[IU]/mL (ref 0.350–4.500)

## 2020-06-04 LAB — ECHOCARDIOGRAM COMPLETE
Area-P 1/2: 4.26 cm2
Calc EF: 36.8 %
MV M vel: 4.2 m/s
MV Peak grad: 70.6 mmHg
S' Lateral: 5.4 cm
Single Plane A2C EF: 33.2 %
Single Plane A4C EF: 42.3 %

## 2020-06-04 MED ORDER — DAPAGLIFLOZIN PROPANEDIOL 10 MG PO TABS: 10.0000 mg | ORAL_TABLET | Freq: Every day | ORAL | 6 refills | Status: AC

## 2020-06-04 MED ORDER — PERFLUTREN LIPID MICROSPHERE
1.0000 mL | INTRAVENOUS | Status: DC | PRN
  Administered 2020-06-04: 2 mL via INTRAVENOUS
  Filled 2020-06-04: qty 10

## 2020-06-04 NOTE — Progress Notes (Signed)
  Echocardiogram 2D Echocardiogram with defintiy has been performed.  Darlina Sicilian M 06/04/2020, 2:56 PM

## 2020-06-04 NOTE — Telephone Encounter (Signed)
Patient was seen in clinic today and started on Farxiga. The current 30 day co-pay is $394, which is a barrier. Started an application for [place].   Will fax in once signatures obtained.  Patient inquired into [place] and Eliquis assistance. I emailed the patient's wife with the applications again. Explained that BMS requires 3% OOP before approval. Provided a month of samples of Entresto and 2 months of Eliquis.  Entresto LOT [id number] EXP 5/23  Eliquis LOT [id number] EXP 4/23

## 2020-06-04 NOTE — Patient Instructions (Signed)
Start Farxiga 10 mg Daily  Labs done today, your results will be available in MyChart, we will contact you for abnormal readings.  Your physician recommends that you return for lab work in: 1 week, we have given you a prescription to have this done locally  Your physician recommends that you schedule a follow-up appointment in: 3 months, call us in May to schedule your appointment for July  If you have any questions or concerns before your next appointment please send [place] a message through [place] or call our office at [phone].    TO LEAVE A MESSAGE FOR THE NURSE SELECT OPTION 2, PLEASE LEAVE A MESSAGE INCLUDING: . YOUR NAME . DATE OF BIRTH . CALL BACK NUMBER . REASON FOR CALL**this is important as we prioritize the call backs  [place] AS LONG AS YOU CALL BEFORE 4:00 PM  At the [facility], you and your health needs are our priority. As part of our continuing mission to provide you with exceptional heart care, we have created designated Provider Care Teams. These Care Teams include your primary Cardiologist (physician) and Advanced Practice Providers (APPs- Physician Assistants and Nurse Practitioners) who all work together to provide you with the care you need, when you need it.   You may see any of the following providers on your designated Care Team at your next follow up: Marland Kitchen Dr Glori Bickers . Dr Loralie Champagne . Dr Vickki Muff . Darrick Grinder, NP . Lyda Jester, [place] . Audry Riles, PharmD   Please be sure to bring in all your medications bottles to every appointment.

## 2020-06-06 NOTE — Progress Notes (Signed)
Advanced Heart Failure Clinic Note   Referring Physician: PCP: [name], MD PCP-Cardiologist: Sinclair Grooms, MD  HF: Dr Aundra Dubin  HPI: Logan [name] is a 71 y.o. male  with history of HTN, paroxysmal atrial fibrillation, OSA, untreated prostate cancer 2012 , RLL mass 2015 declined treatment, and chronic systolic heart failure.   Admitted 1/01-09/15/08 with A/C systolic HF. Required thoracentesis 7/17. Echo showed EF 15% with biventricular failure. Course complicated by Afib RVR and associated flash pulmonary edema. Required intubation and pressors. Diuresed with IV lasix. Oncology consulted for lung cancer (RLL) with liver mets. MRI brain was negative for mets, but showed small left cerebellar infarct. He had hemoptysis with heparin, so he was only discharged on ASA 81 mg daily. HF meds were optimized. He was eventually started on apixaban.   He saw Dr Burr Medico and was started on Alectinib, which has potential cardiac toxicity, especially bradycardia.   Admitted to [facility] with increased dyspnea on 01/02/18. Had PEA arrest and required intubation. LHC with no coronary disease. LVEDP was 40 so he was diuresed with IV lasix and later transitioned to 40 mg po lasix. Discharge weight 221 pound. He was discharged to home  01/06/2018.  Echo in 10/19 showed EF 25-30%.    Echo in 8/20 showed EF 30-35% with mildly decreased RV systolic function. Echo was done today and reviewed, EF 25% with mild LV dilation, normal RV, IVC normal.   He presents today for followup of CHF.  He remains on alectinib for treatment of his metastatic lung adenocarcinoma, no progression so far. No complaints today.  He walks for 1 hour about 4 times/week without dyspnea.  No chest pain.  No orthopnea/PND.  No palpitations.  Weight is stable.   Labs (3/20): K 4.5, creatinine 1.45 => 1.71, LFTs normal, hgb 11.2 Labs (7/20): K 3.9, creatinine 1.48, LFTs normal, hgb 11.7 Labs (3/21): hgb 12, K 4.5, creatinine  1.64 Labs (12/21): K 3.9, creatinine 1.74, LFTs normal   ECG (personally reviewed): NSR, 1st degree AVB, lateral TWIs  PMH: 1. Metastatic non-small cell lung cancer: he is getting alectinib with good response so far.   - Liver mets 2. Prostate cancer: Untreated.  3. Atrial fibrillation: Paroxysmal.  4. 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  5. Chronic systolic CHF: Nonischemic cardiomyopathy.   - Echo (7/19): EF 15%.  - LHC at [facility] 01/02/2018: No significant coronary disease.   - Echo (10/19): EF 25-30%, moderate LV dilation, mild LVH.  - Echo (8/20): EF 25-85%, grade 2 diastolic dysfunction, mildly decreased RV systolic function, normal IVC.  - Echo (3/22): EF 25% with mild LV dilation, normal RV, IVC normal.  6. HTN 7. CKD: Stage 3  Review of systems complete and found to be negative unless listed in HPI.    Current Outpatient Medications  Medication Sig Dispense Refill  . alectinib (ALECENSA) 150 MG capsule TAKE 4 CAPSULES BY MOUTH TWICE A DAY WITH A MEAL 240 capsule 6  . amiodarone (PACERONE) 100 MG tablet TAKE 1 TABLET BY MOUTH DAILY (NEED APPOINTMENT FOR FURTHER REFILLS) 90 tablet 0  . apixaban (ELIQUIS) 5 MG TABS tablet Take 1 tablet (5 mg total) by mouth 2 (two) times daily. 60 tablet 11  . carvedilol (COREG) 6.25 MG tablet TAKE 1 TABLET TWICE DAILY WITH MEALS 180 tablet 3  . Cholecalciferol 125 MCG (5000 UT) capsule Take 5,000 Units by mouth 2 (two) times daily.     . dapagliflozin propanediol (FARXIGA)  10 MG TABS tablet Take 1 tablet (10 mg total) by mouth daily before breakfast. 30 tablet 6  . ENTRESTO 24-26 MG TAKE ONE TABLET BY MOUTH TWICE A DAY 60 tablet 3  . furosemide (LASIX) 80 MG tablet TAKE 1 TABLET EVERY DAY (NEED MD APPOINTMENT FOR REFILLS) 60 tablet 3  . Multiple Vitamin (MULTIVITAMIN) tablet Take 1 tablet by mouth daily.    . potassium chloride SA (KLOR-CON) 20 MEQ tablet Take 1 tablet (20 mEq total) by mouth daily. 90 tablet  1  . spironolactone (ALDACTONE) 25 MG tablet TAKE 1/2 TABLET EVERY DAY 45 tablet 3   No current facility-administered medications for this encounter.   No Known Allergies  Social History   Socioeconomic History  . Marital status: Married    Spouse name: Not on file  . Number of children: Not on file  . Years of education: Not on file  . Highest education level: Not on file  Occupational History  . Not on file  Tobacco Use  . Smoking status: Former Smoker    Years: 6.50    Types: Cigarettes, Cigars, Pipe    Quit date: 03/14/1974    Years since quitting: 46.2  . Smokeless tobacco: Never Used  Vaping Use  . Vaping Use: Never used  Substance and Sexual Activity  . Alcohol use: Yes    Alcohol/week: 0.0 standard drinks    Comment: 09/26/2017 "holidays only"  . Drug use: Never  . Sexual activity: Yes  Other Topics Concern  . Not on file  Social History Narrative  . Not on file   Social Determinants of Health   Financial Resource Strain: Not on file  Food Insecurity: Not on file  Transportation Needs: Not on file  Physical Activity: Not on file  Stress: Not on file  Social Connections: Not on file  Intimate Partner Violence: Not on file   Family History  Problem Relation Age of Onset  . Parkinson's disease Mother   . Hypertension Father    Vitals:   06/04/20 1502  BP: 108/68  Pulse: (!) 58  SpO2: 98%  Weight: 108 kg (238 lb 3.2 oz)   Wt Readings from Last 3 Encounters:  06/04/20 108 kg (238 lb 3.2 oz)  02/24/20 106.3 kg (234 lb 4.8 oz)  11/06/19 105.4 kg (232 lb 4.8 oz)   PHYSICAL EXAM: General: NAD Neck: No JVD, no thyromegaly or thyroid nodule.  Lungs: Clear to auscultation bilaterally with normal respiratory effort. CV: Nondisplaced PMI.  Heart regular S1/S2, no S3/S4, no murmur.  No peripheral edema.  No carotid bruit.  Normal pedal pulses.  Abdomen: Soft, nontender, no hepatosplenomegaly, no distention.  Skin: Intact without lesions or rashes.   Neurologic: Alert and oriented x 3.  Psych: Normal affect. Extremities: No clubbing or cyanosis.  HEENT: Normal.   ASSESSMENT & PLAN:  1. Chronic systolic CHF: Long history of nonischemic cardiomyopathy.  He does not have an ICD due to metastatic lung cancer and untreated prostate cancer.  Echo in 8/20 showed EF mildly higher at 30-35%.  NYHA class II symptoms.  He is not volume overloaded on exam.  - Start Farxiga 10 mg daily today with CHF and CKD.  BMET today and in 10 days.  - Continue Entresto 24/26 mg BID.  - Continue Coreg 6.25 mg bid.  - Continue spironolactone 12.5 mg daily.    -ICD not planned due to metastatic cancer diagnosis though he is doing well on therapy with alectinib.   2.Lung cancer:  Stage IV with liver mets, adenocarcinoma. Good response so far with alectinib.  - Follows with Dr Burr Medico.   3. Atrial fibrillation: Paroxysmal. He is in NSR today.  - Continue amiodarone 100 mg daily. Check LFTs and TSH today.  He will need regular eye exam.  - Continue Eliquis 5 mg BID.  4. CKD: Stage 3.  - BMET today.  5. Prostate cancer: Untreated.  6. 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  He is on Eliquis.   Followup in 3 months  Loralie Champagne, MD 06/06/20

## 2020-06-19 IMAGING — MR MR HEAD W/O CM
10 of 11 series · 43 of 48 positions shown · non-contrast
Comparison: None.

CLINICAL DATA: Metastatic lung cancer.

EXAM:
MRI HEAD WITHOUT CONTRAST
TECHNIQUE: Multiplanar, multiecho pulse sequences of the brain and surrounding
structures were obtained without intravenous contrast.

[Series 5: ax dwi_tracew · axial · 3.0mm · 1.50mm/px · z∈[-51,+81]mm · 10 of 90 slices shown]
[im 1/90]
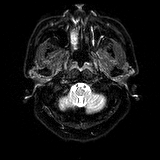
[im 10/90]
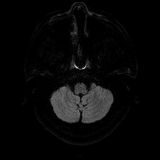
[im 20/90]
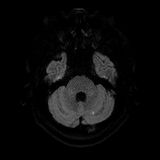
[im 30/90]
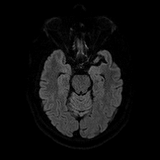
[im 40/90]
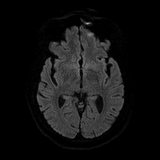
[im 50/90]
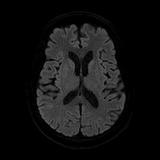
[im 60/90]
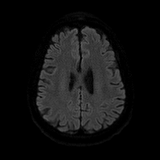
[im 70/90]
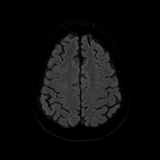
[im 80/90]
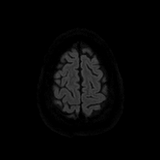
[im 90/90]
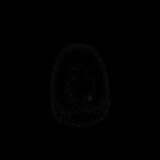

[Series 6: ax dwi_adc · axial · 3.0mm · 1.50mm/px · z∈[-51,+81]mm · 5 of 45 slices shown]
[im 1/45]
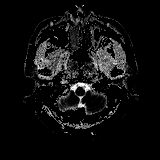
[im 12/45]
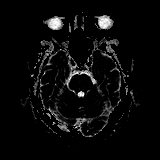
[im 23/45]
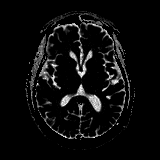
[im 34/45]
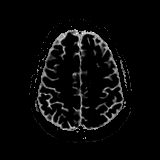
[im 45/45]
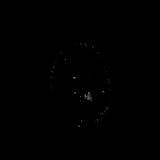

[Series 7: cor dwi_tracew · coronal · 5.0mm · 1.44mm/px · 7 of 72 slices shown]
[im 1/72]
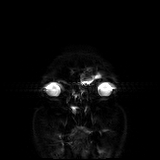
[im 12/72]
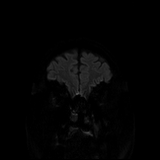
[im 24/72]
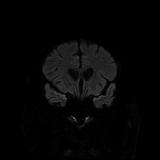
[im 36/72]
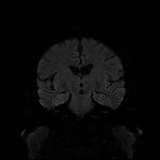
[im 48/72]
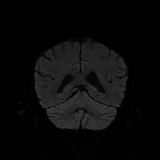
[im 60/72]
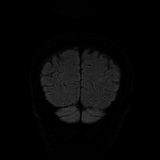
[im 72/72]
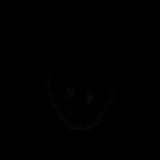

[Series 8: cor dwi_adc · coronal · 5.0mm · 1.44mm/px · 3 of 35 slices shown]
[im 1/35]
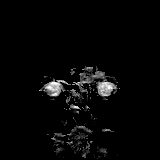
[im 18/35]
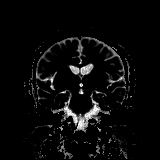
[im 35/35]
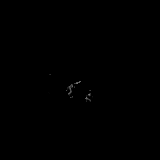

[Series 9: T1 · sagittal · 5.0mm · 0.75mm/px · 2 of 23 slices shown]
[im 1/23]
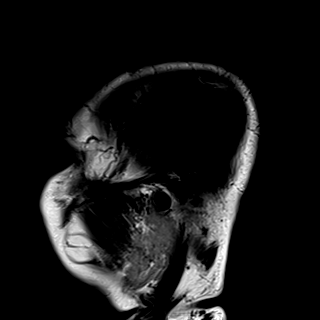
[im 23/23]
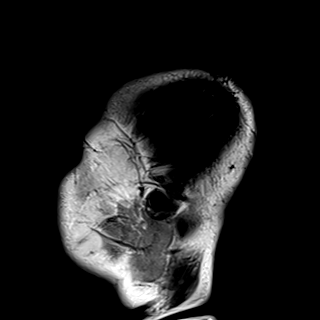

[Series 10: T2 · axial · 5.0mm · 0.72mm/px · z∈[-56,+87]mm · 2 of 25 slices shown (1 of 2)]
[im 1/25]
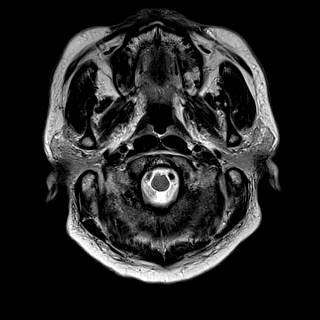
[im 25/25]
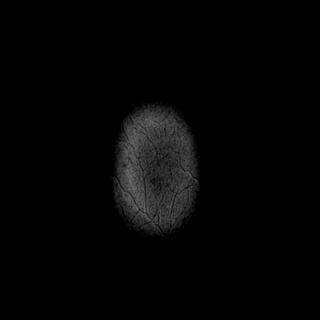

[Series 11: FLAIR · axial · 5.0mm · 0.45mm/px · z∈[-56,+88]mm · 2 of 25 slices shown]
[im 1/25]
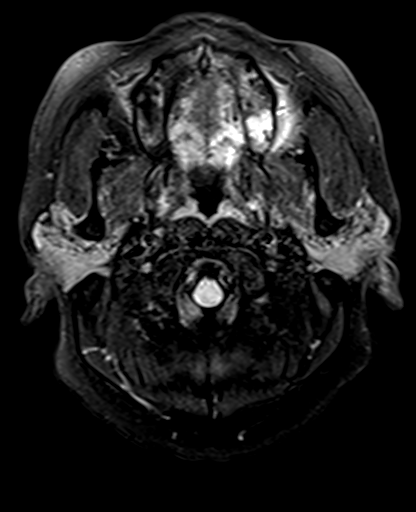
[im 25/25]
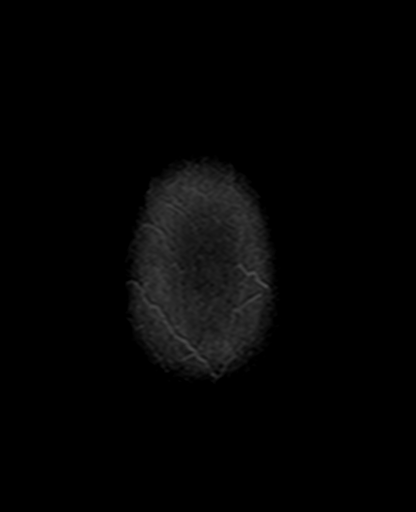

[Series 12: swi_images · axial · 3.0mm · 0.90mm/px · z∈[-64,+88]mm · 5 of 52 slices shown]
[im 1/52]
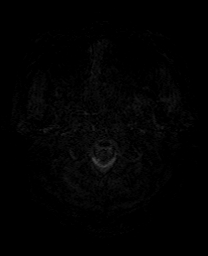
[im 13/52]
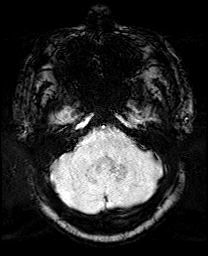
[im 26/52]
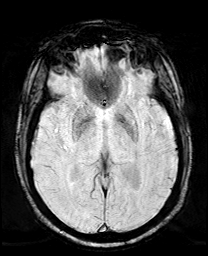
[im 39/52]
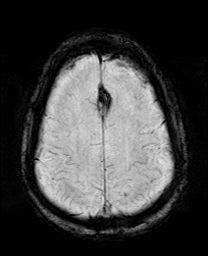
[im 52/52]
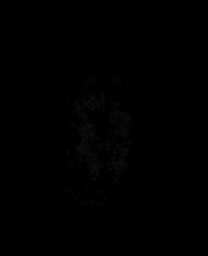

[Series 13: mip_images(sw) · axial · 24.0mm · 0.90mm/px · z∈[-54,+78]mm · 4 of 45 slices shown]
[im 1/45]
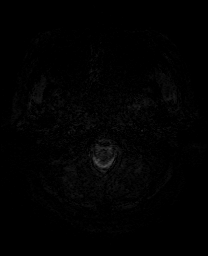
[im 15/45]
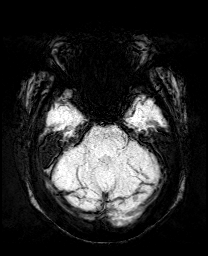
[im 30/45]
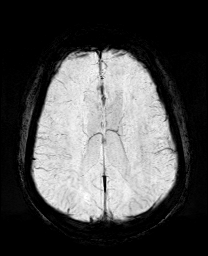
[im 45/45]
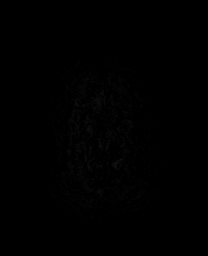

[Series 15: T2 · coronal · 5.0mm · 0.34mm/px · 3 of 29 slices shown (2 of 2)]
[im 1/29]
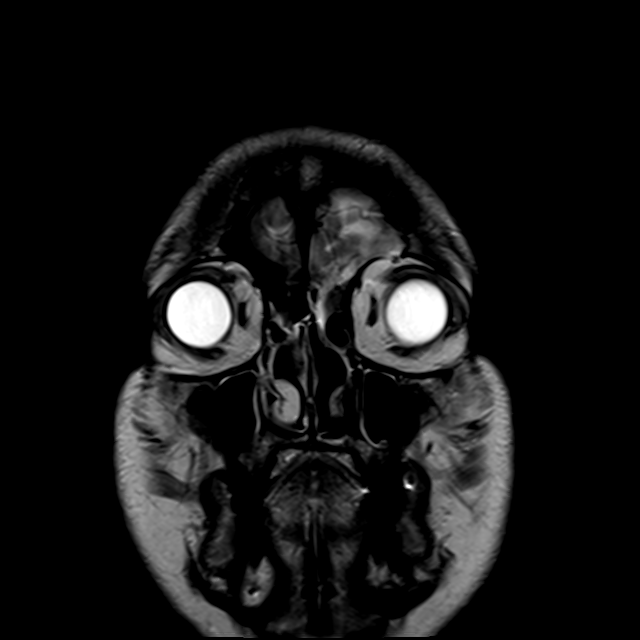
[im 15/29]
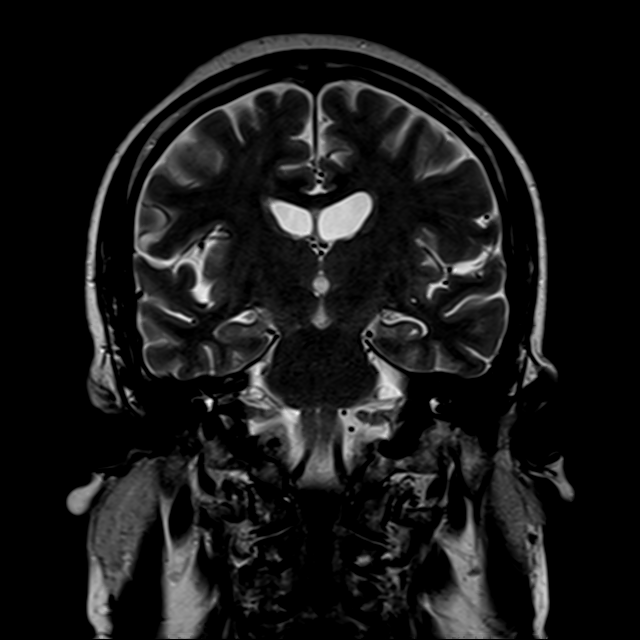
[im 29/29]
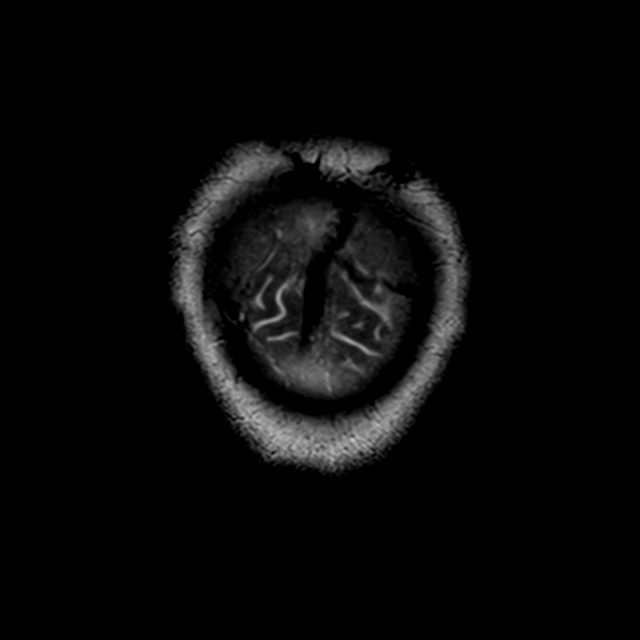

[43 of 48 positions shown; findings below may reference images not displayed]

FINDINGS: BRAIN: There is a subcentimeter focus of abnormal diffusion
restriction in the left cerebellar hemisphere. No other diffusion
abnormality. Minimal adjacent edema. The midline structures are
normal. There are no old infarcts. The white matter signal is normal
for the patient's age. The CSF spaces are normal for age, with no
hydrocephalus. Susceptibility-sensitive sequences show no chronic
microhemorrhage or superficial siderosis.

VASCULAR: Major intracranial arterial and venous sinus flow voids
are preserved.

SKULL AND UPPER CERVICAL SPINE: The visualized skull base,
calvarium, upper cervical spine and extracranial soft tissues are
normal.

SINUSES/ORBITS: No fluid levels or advanced mucosal thickening. No
mastoid or middle ear effusion. The orbits are normal.
IMPRESSION: 1. Single subcentimeter focus of diffusion restriction in the left
cerebellum is favored to be an acute ischemic infarct, given the
lack of surrounding edema. A small metastasis could also have this
appearance, but perilesional edema is typically much greater. If
there are no contraindications, contrast administration might be
helpful.
2. Otherwise normal brain for age.

## 2020-06-23 ENCOUNTER — Other Ambulatory Visit: Payer: Self-pay

## 2020-06-23 DIAGNOSIS — C3491 Malignant neoplasm of unspecified part of right bronchus or lung: Secondary | ICD-10-CM

## 2020-06-23 MED ORDER — ALECENSA 150 MG PO CAPS: ORAL_CAPSULE | ORAL | 6 refills | Status: DC

## 2020-07-01 ENCOUNTER — Inpatient Hospital Stay: Payer: Medicare Other | Admitting: Hematology

## 2020-07-01 ENCOUNTER — Inpatient Hospital Stay: Payer: Medicare Other

## 2020-07-03 NOTE — Progress Notes (Signed)
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 Cardiomegaly with diffuse increased interstitial markings consistent with pulmonary edema. 2. 5-6 cm rounded density seen in the right lower lobe medially. This could be related to an area of consolidation correlate clinically for pneumonia.. But the rounded and well-defined borders are suspicious for mass. Followup x-ray suggested after  treating for the pulmonary edema/pneumonia to see if this resolves.   04/30/2014 Imaging   04/30/2014 CXR IMPRESSION: Enlarging right lower lobe nodule. Would recommend PET CT to further evaluate.   01/20/2015 Imaging   01/20/2015 CXR IMPRESSION: Significant improvement comparatively, now with smaller pleural effusions and resolving pulmonary edema. Known right lower lobe mass suspicious for bronchogenic carcinoma is redemonstrated.   04/07/2015 PET scan   04/07/2015 PET Scan IMPRESSION:  Hypermetabolic right lower lobe mass Hypermetabolic left adrenal nodule.   08/21/2015 Imaging   08/21/2015 CXR IMPRESSION: Right lower lobe mass.   09/22/2017 Imaging   09/22/2017 CXR IMPRESSION: 1.Moderate right pleural effusion with right lower/middle lobe consolidation. Minimal  patchy opacities in left lower lobe. 2.Cardiomegaly.   09/26/2017 Imaging   09/26/2017 CT Chest WO Contrast IMPRESSION: 1. Large RIGHT lower lobe mass with central and peripheral calcifications occupies the near entirety of the RIGHT lower lobe and concerning for neoplasm. 2. Moderate RIGHT effusion. 3. Mild mediastinal and RIGHT hilar adenopathy. 4. Recommend FDG PET scan for biopsy planning/staging   09/26/2017 Tumor Marker    Ref. Range 09/26/2017 21:31  Prostatic Specific Antigen Latest Ref Range: 0.00 - 4.00 ng/mL 84.83 (H)      09/27/2017 Imaging   09/27/2017 CT AP W WO Contrast IMPRESSION: 1. Large complex masses in the right lower lobe of the lung and in the right hepatic lobe. Smaller liver lesions in the left hepatic lobe. Nonspecific lesions in the spleen and kidneys. No significant pathologic adenopathy. This would not be a characteristic metastatic pattern for prostate cancer and is not a specific metastatic and imaging pattern to suggest an individual etiology. Possibilities might include metastatic lung cancer, melanoma, metastatic hepatocellular carcinoma, or a variety of other possibilities. Tissue diagnosis recommended. 2. Moderate prostatomegaly. 3. Scattered mild ascites. 4. Moderate cardiomegaly.  Small inferior pericardial effusion. 5. Trace bilateral pleural effusions.   10/02/2017 Pathology Results   10/02/2017 Surgical Pathology Diagnosis Liver, needle/core biopsy, Left Hepatic Lobe - METASTATIC LUNG ADENOCARCINOMA TO THE LIVER. SEE NOTE   10/02/2017 Cancer Staging   Staging form: Lung, AJCC 8th Edition - Clinical stage from 10/02/2017: Stage IV (cT3, cN2, cM1c) - Signed by Truitt Merle, MD on 10/20/2017   10/04/2017 Initial Diagnosis   Non-small cell lung cancer (NSCLC) ([place])   10/21/2017 -  Antibody Plan   Alectinib $RemoveBe'600mg'joMZXpurZ$  bid started on 10/21/2017   01/10/2018 PET scan   01/10/2018 PET Scan IMPRESSION:  1. Marked reduction in size of the right lower  lobe mass with fairly low metabolic activity in the residual mass, maximum SUV 5.3. Notably reduced thoracic adenopathy which is not currently hypermetabolic. 2. Marked reduction in size and conspicuity of the hepatic tumors, both of which have metabolic activity similar to that of the background liver parenchyma. 3. Resolution of the splenic mass. No additional or new lesions are identified. 4. Accentuated activity anteriorly in the left seventh rib, low-grade with maximum SUV 3.5, without appreciable CT abnormality on the CT data, significance uncertain. 5. Accentuated activity at the right sternoclavicular joint, SUV 7.2, probably from arthropathy. 6. Accentuated activity in the posterior and apical portions of the prostate gland, significance uncertain. 7. Other imaging findings of potential clinical significance: Chronic left maxillary sinusitis.   10/03/2018 Imaging   CT CAP W contrast 10/03/18 IMPRESSION: 1. No new or progressive findings on today's study. 2. Continued further slight reduction in size of the right lower mass. No lymphadenopathy in the chest. 3. Continued further decrease in size of liver lesions with a left hepatic lesion noted previously not apparent on today's study. 4. Stable 6 mm ill-defined subpleural nodule right upper lobe. Continued attention on follow-up recommended.   05/22/2019 Imaging   CT CAP WO Contrast  IMPRESSION: 1. Unchanged post treatment consolidation and calcification of the anterior right lower lobe. 2. Unchanged capsular retraction of the anterior liver dome, a hypodense mass at this site on prior examinations not discretely appreciated. 3. No noncontrast evidence of new intrathoracic or intra-abdominal metastatic disease. 4. Unchanged subpleural ground-glass nodule of the right upper lobe measuring 6 mm. An indolent lung malignancy is not excluded. Continued attention on follow-up. 5. Prostatomegaly.      Imaging         CURRENT THERAPY:  Alectinib 626mBIDstarted on 10/21/2017  INTERVAL HISTORY:  Logan BOBis here for a follow up of metastatic lung cancer. He was last seen by me 4 months ago. He presents to the clinic with his wife. He notes he is doing well. He denies pain or stomach issues and no SOB. He is able to go up stairs for his job without break. He walks some daily, but not a full mile. He works about part time. I reviewed his medication list with me and is current. He is being seen by Dr MAlgernon Huxley who is putting him on more medication recently but is not covered well by [employer] right now. His cardiac medications is about $400 and Dr MAlgernon Huxleyclaimed to give free samaple if possible. Hie wife notes they moved to smaller home.    REVIEW OF SYSTEMS:   Constitutional: Denies fevers, chills or abnormal weight loss Eyes: Denies blurriness of vision Ears, nose, mouth, throat, and face: Denies mucositis or sore throat Respiratory: Denies cough, dyspnea or wheezes Cardiovascular: Denies palpitation, chest discomfort or lower extremity swelling Gastrointestinal:  Denies nausea, heartburn or change in bowel habits Skin: Denies abnormal skin rashes Lymphatics: Denies new lymphadenopathy or easy bruising Neurological:Denies numbness, tingling or new weaknesses Behavioral/Psych: Mood is stable, no new changes  All other systems were reviewed with the patient and are negative.  MEDICAL HISTORY:  Past Medical History:  Diagnosis Date  . CHF (congestive heart failure) ([place]   . Chronic systolic heart failure ([place] 04/15/2015    EF 30-35% by echo 2016 , [facility]   . Hypertension   . Lung mass 04/15/2015    Never biopsied. History of PET scan that raised concern.   .Marland Kitchen  Metabolic syndrome 08/16/3327  . OSA on CPAP   . Paroxysmal atrial fibrillation ([place]) 04/15/2015   On apixaban   . Prostate cancer ([place])   . Thrombocytopenia ([place]) 04/15/2015    SURGICAL HISTORY: Past Surgical History:  Procedure  Laterality Date  . CARDIAC CATHETERIZATION N/A 04/27/2015   Procedure: Right/Left Heart Cath and Coronary Angiography;  Surgeon: [name], MD;  Location: [facility];  Service: Cardiovascular;  Laterality: N/A;  . CARDIOVERSION N/A 09/29/2017   Procedure: CARDIOVERSION;  Surgeon: Pixie Casino, MD;  Location: [facility];  Service: Cardiovascular;  Laterality: N/A;  . IR THORACENTESIS ASP PLEURAL SPACE W/IMG GUIDE  09/27/2017  . PROSTATE BIOPSY    . TEE WITHOUT CARDIOVERSION N/A 09/29/2017   Procedure: TRANSESOPHAGEAL ECHOCARDIOGRAM (TEE);  Surgeon: Pixie Casino, MD;  Location[facility] ENDOSCOPY;  Service: Cardiovascular;  Laterality: N/A;    I have reviewed the social history and family history with the patient and they are unchanged from previous note.  ALLERGIES:  has No Known Allergies.  MEDICATIONS:  Current Outpatient Medications  Medication Sig Dispense Refill  . alectinib (ALECENSA) 150 MG capsule TAKE 4 CAPSULES BY MOUTH TWICE A DAY WITH A MEAL 240 capsule 0  . amiodarone (PACERONE) 100 MG tablet Take 1 tablet (100 mg total) by mouth daily. 90 tablet 3  . apixaban (ELIQUIS) 5 MG TABS tablet Take 1 tablet (5 mg total) by mouth 2 (two) times daily. 60 tablet 11  . carvedilol (COREG) 6.25 MG tablet TAKE 1 TABLET TWICE DAILY WITH MEALS 180 tablet 3  . Cholecalciferol 125 MCG (5000 UT) capsule Take 5,000 Units by mouth 2 (two) times daily.     . dapagliflozin propanediol (FARXIGA) 10 MG TABS tablet Take 1 tablet (10 mg total) by mouth daily before breakfast. 30 tablet 6  . ENTRESTO 24-26 MG TAKE ONE TABLET BY MOUTH TWICE A DAY 60 tablet 3  . furosemide (LASIX) 80 MG tablet Take 1 tablet (80 mg total) by mouth daily. 90 tablet 3  . Multiple Vitamin (MULTIVITAMIN) tablet Take 1 tablet by mouth daily.    . potassium chloride SA (KLOR-CON) 20 MEQ tablet Take 1 tablet (20 mEq total) by mouth daily. 90 tablet 1  . spironolactone (ALDACTONE) 25 MG tablet TAKE 1/2 TABLET EVERY DAY  45 tablet 3   No current facility-administered medications for this visit.    PHYSICAL EXAMINATION: ECOG PERFORMANCE STATUS: 1 - Symptomatic but completely ambulatory  Vitals:   07/08/20 1435  BP: (!) 100/59  Pulse: (!) 58  Resp: 18  Temp: (!) 97.5 F (36.4 C)  SpO2: 98%   Filed Weights   07/08/20 1435  Weight: 237 lb 12.8 oz (107.9 kg)    GENERAL:alert, no distress and comfortable SKIN: skin color, texture, turgor are normal, no rashes or significant lesions EYES: normal, Conjunctiva are pink and non-injected, sclera clear  NECK: supple, thyroid normal size, non-tender, without nodularity LYMPH:  no palpable lymphadenopathy in the cervical, axillary  LUNGS: clear to auscultation and percussion with normal breathing effort HEART: regular rate & rhythm and no murmurs and no lower extremity edema ABDOMEN:abdomen soft, non-tender and normal bowel sounds Musculoskeletal:no cyanosis of digits and no clubbing  NEURO: alert & oriented x 3 with fluent speech, no focal motor/sensory deficits  LABORATORY DATA:  I have reviewed the data as listed CBC Latest Ref Rng & Units 07/08/2020 06/04/2020 02/20/2020  WBC 4.0 - 10.5 K/uL 5.8 6.3 6.2  Hemoglobin 13.0 - 17.0 g/dL 12.3(L) 12.9(L)  12.2(L)  Hematocrit 39.0 - 52.0 % 34.8(L) 37.5(L) 35.4(L)  Platelets 150 - 400 K/uL 146(L) 139(L) 133(L)     CMP Latest Ref Rng & Units 07/08/2020 06/04/2020 02/20/2020  Glucose 70 - 99 mg/dL 85 92 95  BUN 8 - 23 mg/dL 36(H) 28(H) 32(H)  Creatinine 0.61 - 1.24 mg/dL 2.04(H) 1.58(H) 1.74(H)  Sodium 135 - 145 mmol/L 140 137 139  Potassium 3.5 - 5.1 mmol/L 3.8 4.2 3.9  Chloride 98 - 111 mmol/L 102 100 101  CO2 22 - 32 mmol/L _0 Calcium 8.9 - 10.3 mg/dL 9.7 9.3 9.7  Total Protein 6.5 - 8.1 g/dL 7.8 7.8 7.8  Total Bilirubin 0.3 - 1.2 mg/dL 0.4 0.6 0.6  Alkaline Phos 38 - 126 U/L 67 51 53  AST 15 - 41 U/L _1 ALT 0 - 44 U/L _2 RADIOGRAPHIC STUDIES: I have personally reviewed  the radiological images as listed and agreed with the findings in the report. No results found.   ASSESSMENT & PLAN:  Logan [name] is a 70 y.o. male with   1. 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 -Due to his advanced heart failure, he isnota good candidate for intensive chemotherapy. -tumor NGS showed ALK translocation (+) -He started Alectinib on 10/21/2017, tolerating very wellwith manageable constipation -He is clinically stable and doing well. Labs reviewed, CBC and CMP WNL except stable Hg 12.3, plt 146K, BUN 36, Cr 2.04. Physical exam unremarkable. -ContinueAlectinib662m BID. He is tolerating well.  -We again discussed the incurable nature of his metastatic cancer, and the likely progress on alectinib down the road, will continue follow-up closely. Plan for next scan in 11/2020.  -we discussed next line options after progression on Alectinib, which will be third generation TKI Lorlatinib. We also discussed option of chemo if he progresses further if his PS remains to be good. Pt is interested in more treatment.  -F/u in 3 months  2.History of untreated prostate cancer, with rising PSA -will f/u PSA -Prostatomegaly seen on CT CAP from 05/22/19 -He has not had biopsy.we again discussed diagnostic work-up, he declined further workup and conventional treatment for this.   3.Bilateral pleural effusion,R>L, likely related to CHF -Resolved now. On Lasix.   4.Cardiomyopathy with EF 10 to 15%, history of cardiac arrest -TTE/TEEpreviouslyshowed decreased ejection fraction to 10 to 15% -He is not a candidate for advanced cardiac therapiesdue to his stage IV lung cancer -His heart condition has improved with life style change.  -F/u with cardiologistDr. MMorton AmyDr. STamala Julian-He was  recently started on Pacerone and Lasix, I filled through his insurance for more affordable prices.   5. Atrial Fibrillation -Was on Xarelto but off due to hemoptysis -Continue aspirin 81 and follow-up with cardiology to decide on antiplatelet versus anticoagulation -OnEliquis 5 mg  6. Incidentalstroke:leftcerebellar smallinfarct -Resultantno neurological focal deficit -On Xarelto (rivaroxaban) dailyprior to admission (off due to hemoptysis), now onaspirin 81 mg daily.  7. Goal of care discussion -The patient understands the goal of care is palliative. -He is full code now   8.CKD stage III -stable, continue monitoring  Plan: -He is clinically doing well -ContinueAlectinib6057mBID -Lab and F/u in 3 months  -I filled Alectinib through [employer] insurance) for more affordable prices.  No problem-specific Assessment & Plan notes found for this encounter.   No orders of the defined types were placed in this encounter.  All questions were answered. The patient knows to call the clinic with any problems, questions or concerns. No barriers to learning was detected. The total time spent in the appointment was 30 minutes.     Truitt Merle, MD 07/08/2020   I, Joslyn Devon, am acting as scribe for Truitt Merle, MD.   I have reviewed the above documentation for accuracy and completeness, and I agree with the above.

## 2020-07-06 ENCOUNTER — Other Ambulatory Visit (HOSPITAL_COMMUNITY): Payer: Self-pay | Admitting: Cardiology

## 2020-07-06 ENCOUNTER — Other Ambulatory Visit: Payer: Self-pay | Admitting: Internal Medicine

## 2020-07-08 ENCOUNTER — Inpatient Hospital Stay (HOSPITAL_BASED_OUTPATIENT_CLINIC_OR_DEPARTMENT_OTHER): Payer: Medicare Other | Admitting: Hematology

## 2020-07-08 ENCOUNTER — Telehealth: Payer: Self-pay | Admitting: Hematology

## 2020-07-08 ENCOUNTER — Inpatient Hospital Stay: Payer: Medicare Other | Attending: Hematology

## 2020-07-08 ENCOUNTER — Other Ambulatory Visit: Payer: Self-pay

## 2020-07-08 ENCOUNTER — Encounter: Payer: Self-pay | Admitting: Hematology

## 2020-07-08 VITALS — BP 100/59 | HR 58 | Temp 97.5°F | Resp 18 | Ht 71.0 in | Wt 237.8 lb

## 2020-07-08 DIAGNOSIS — C61 Malignant neoplasm of prostate: Secondary | ICD-10-CM | POA: Insufficient documentation

## 2020-07-08 DIAGNOSIS — I480 Paroxysmal atrial fibrillation: Secondary | ICD-10-CM

## 2020-07-08 DIAGNOSIS — Z7984 Long term (current) use of oral hypoglycemic drugs: Secondary | ICD-10-CM | POA: Diagnosis not present

## 2020-07-08 DIAGNOSIS — I429 Cardiomyopathy, unspecified: Secondary | ICD-10-CM | POA: Diagnosis not present

## 2020-07-08 DIAGNOSIS — N183 Chronic kidney disease, stage 3 unspecified: Secondary | ICD-10-CM | POA: Diagnosis not present

## 2020-07-08 DIAGNOSIS — I5022 Chronic systolic (congestive) heart failure: Secondary | ICD-10-CM | POA: Insufficient documentation

## 2020-07-08 DIAGNOSIS — I428 Other cardiomyopathies: Secondary | ICD-10-CM

## 2020-07-08 DIAGNOSIS — C787 Secondary malignant neoplasm of liver and intrahepatic bile duct: Secondary | ICD-10-CM | POA: Insufficient documentation

## 2020-07-08 DIAGNOSIS — Z8673 Personal history of transient ischemic attack (TIA), and cerebral infarction without residual deficits: Secondary | ICD-10-CM | POA: Diagnosis not present

## 2020-07-08 DIAGNOSIS — I129 Hypertensive chronic kidney disease with stage 1 through stage 4 chronic kidney disease, or unspecified chronic kidney disease: Secondary | ICD-10-CM | POA: Diagnosis not present

## 2020-07-08 DIAGNOSIS — I5032 Chronic diastolic (congestive) heart failure: Secondary | ICD-10-CM | POA: Insufficient documentation

## 2020-07-08 DIAGNOSIS — Z8674 Personal history of sudden cardiac arrest: Secondary | ICD-10-CM | POA: Insufficient documentation

## 2020-07-08 DIAGNOSIS — C3491 Malignant neoplasm of unspecified part of right bronchus or lung: Secondary | ICD-10-CM

## 2020-07-08 DIAGNOSIS — Z79899 Other long term (current) drug therapy: Secondary | ICD-10-CM | POA: Insufficient documentation

## 2020-07-08 DIAGNOSIS — C771 Secondary and unspecified malignant neoplasm of intrathoracic lymph nodes: Secondary | ICD-10-CM | POA: Diagnosis not present

## 2020-07-08 DIAGNOSIS — Z7901 Long term (current) use of anticoagulants: Secondary | ICD-10-CM | POA: Insufficient documentation

## 2020-07-08 DIAGNOSIS — C3431 Malignant neoplasm of lower lobe, right bronchus or lung: Secondary | ICD-10-CM | POA: Diagnosis not present

## 2020-07-08 LAB — CBC WITH DIFFERENTIAL (CANCER CENTER ONLY)
Abs Immature Granulocytes: 0.02 10*3/uL (ref 0.00–0.07)
Basophils Absolute: 0 10*3/uL (ref 0.0–0.1)
Basophils Relative: 1 %
Eosinophils Absolute: 0 10*3/uL (ref 0.0–0.5)
Eosinophils Relative: 0 %
HCT: 34.8 % — ABNORMAL LOW (ref 39.0–52.0)
Hemoglobin: 12.3 g/dL — ABNORMAL LOW (ref 13.0–17.0)
Immature Granulocytes: 0 %
Lymphocytes Relative: 47 %
Lymphs Abs: 2.7 10*3/uL (ref 0.7–4.0)
MCH: 31.1 pg (ref 26.0–34.0)
MCHC: 35.3 g/dL (ref 30.0–36.0)
MCV: 87.9 fL (ref 80.0–100.0)
Monocytes Absolute: 0.6 10*3/uL (ref 0.1–1.0)
Monocytes Relative: 10 %
Neutro Abs: 2.4 10*3/uL (ref 1.7–7.7)
Neutrophils Relative %: 42 %
Platelet Count: 146 10*3/uL — ABNORMAL LOW (ref 150–400)
RBC: 3.96 MIL/uL — ABNORMAL LOW (ref 4.22–5.81)
RDW: 13.1 % (ref 11.5–15.5)
WBC Count: 5.8 10*3/uL (ref 4.0–10.5)
nRBC: 0 % (ref 0.0–0.2)

## 2020-07-08 LAB — CMP (CANCER CENTER ONLY)
ALT: 14 U/L (ref 0–44)
AST: 20 U/L (ref 15–41)
Albumin: 4.4 g/dL (ref 3.5–5.0)
Alkaline Phosphatase: 67 U/L (ref 38–126)
Anion gap: 11 (ref 5–15)
BUN: 36 mg/dL — ABNORMAL HIGH (ref 8–23)
CO2: 27 mmol/L (ref 22–32)
Calcium: 9.7 mg/dL (ref 8.9–10.3)
Chloride: 102 mmol/L (ref 98–111)
Creatinine: 2.04 mg/dL — ABNORMAL HIGH (ref 0.61–1.24)
GFR, Estimated: 34 mL/min — ABNORMAL LOW (ref >60)
Glucose, Bld: 85 mg/dL (ref 70–99)
Potassium: 3.8 mmol/L (ref 3.5–5.1)
Sodium: 140 mmol/L (ref 135–145)
Total Bilirubin: 0.4 mg/dL (ref 0.3–1.2)
Total Protein: 7.8 g/dL (ref 6.5–8.1)

## 2020-07-08 MED ORDER — ALECENSA 150 MG PO CAPS: ORAL_CAPSULE | ORAL | 0 refills | Status: DC

## 2020-07-08 NOTE — Telephone Encounter (Signed)
Scheduled follow-up appointment per 4/27 los. Patient is aware.

## 2020-07-10 ENCOUNTER — Telehealth (HOSPITAL_COMMUNITY): Payer: Self-pay | Admitting: *Deleted

## 2020-07-10 NOTE — Telephone Encounter (Signed)
Pt's wife called to report every time they have went to the pharmacy to use the 30-day free card for Wilder Glade they are told they can not use it b/c he has Medicare and cost is about $400/month so they can not afford it.  Called [facility] and advised them that anyone can use the 30-day free card regardless of insurance so they need to process and accept it. Pharmacist ran a claim and said copay came back for only $29/month.  Pt's wife is aware, she said that means they have hit the deductiable and they can afford that price, she ask about lab work since he is just now starting the [place], advised labs will need to be done in 10-14 days, order for labs mailed to pt's home address. Wife very thankful for assistance

## 2020-07-17 ENCOUNTER — Other Ambulatory Visit (HOSPITAL_COMMUNITY): Payer: Self-pay

## 2020-07-23 ENCOUNTER — Telehealth: Payer: Self-pay

## 2020-07-23 ENCOUNTER — Other Ambulatory Visit: Payer: Self-pay

## 2020-07-23 DIAGNOSIS — C3491 Malignant neoplasm of unspecified part of right bronchus or lung: Secondary | ICD-10-CM

## 2020-07-23 MED ORDER — ALECENSA 150 MG PO CAPS: ORAL_CAPSULE | ORAL | 6 refills | Status: AC

## 2020-07-23 NOTE — Telephone Encounter (Signed)
Patient's wife [name] called stating that the copay for alecensa prescription sent to [place] is $2500.00.  I explained to her that the [employer] patient assistance program contracts with [facility] to provide alencensa to patient's in their patient assistance program.  She requested that a prescription be sent to [facility].  This has been done.

## 2020-07-27 ENCOUNTER — Other Ambulatory Visit (HOSPITAL_COMMUNITY): Payer: Self-pay

## 2020-07-28 ENCOUNTER — Telehealth (HOSPITAL_COMMUNITY): Payer: Self-pay | Admitting: Pharmacy Technician

## 2020-07-28 NOTE — Telephone Encounter (Signed)
One month of samples of Eliquis and Entresto were provided to the patient. Sent patient's wife a 62 day free [place] card.  Eliquis LOT [id number] EXP 5/24  Entresto LOT [id number] EXP 7/24  The patient seems to still be having issues affording medication. He is not out of the donut hole as of right now. Encouraged the patient's wife to fill out and return assistance applications for both [place]. Eliquis will need 3% OOP to be met before we could apply for assistance.  Will be here to assist in the future as needed.  Charlann Boxer, CPhT

## 2020-08-03 ENCOUNTER — Other Ambulatory Visit: Payer: Self-pay | Admitting: Cardiology

## 2020-08-04 LAB — BASIC METABOLIC PANEL
BUN/Creatinine Ratio: 14 (ref 10–24)
BUN: 23 mg/dL (ref 8–27)
CO2: 21 mmol/L (ref 20–29)
Calcium: 9.5 mg/dL (ref 8.6–10.2)
Chloride: 98 mmol/L (ref 96–106)
Creatinine, Ser: 1.67 mg/dL — ABNORMAL HIGH (ref 0.76–1.27)
Glucose: 112 mg/dL — ABNORMAL HIGH (ref 65–99)
Potassium: 3.8 mmol/L (ref 3.5–5.2)
Sodium: 137 mmol/L (ref 134–144)
eGFR: 44 mL/min/{1.73_m2} — ABNORMAL LOW (ref >59)

## 2020-08-04 LAB — SPECIMEN STATUS REPORT

## 2020-09-03 ENCOUNTER — Other Ambulatory Visit (HOSPITAL_COMMUNITY): Payer: Self-pay | Admitting: Cardiology

## 2020-09-23 ENCOUNTER — Telehealth: Payer: Self-pay | Admitting: Hematology

## 2020-09-23 NOTE — Telephone Encounter (Signed)
Rescheduled upcoming appointment due to provider's breast clinic. Patient's wife is aware of changes.

## 2020-10-07 ENCOUNTER — Other Ambulatory Visit: Payer: Medicare Other

## 2020-10-07 ENCOUNTER — Ambulatory Visit: Payer: Medicare Other | Admitting: Hematology

## 2020-10-08 ENCOUNTER — Inpatient Hospital Stay: Payer: Medicare Other

## 2020-10-08 ENCOUNTER — Inpatient Hospital Stay: Payer: Medicare Other | Admitting: Hematology

## 2020-10-12 ENCOUNTER — Telehealth (HOSPITAL_COMMUNITY): Payer: Self-pay | Admitting: Pharmacy Technician

## 2020-10-12 ENCOUNTER — Encounter (HOSPITAL_COMMUNITY): Payer: Self-pay

## 2020-10-12 NOTE — Progress Notes (Unsigned)
Medication Samples have been provided to the patient.  Drug name: Delene Loll       Strength: 24/26 mg        Qty: 1  LOT: [id number]  Exp.Date: 07/24  Dosing instructions: Take 1 tablet Twice daily   The patient has been instructed regarding the correct time, dose, and frequency of taking this medication, including desired effects and most common side effects.   Stanford Scotland 9:14 AM 10/12/2020  Medication Samples have been provided to the patient.  Drug name: Wilder Glade       Strength: 10 mg        Qty: 4  LOT: [id number]  Exp.Date: 02/11/2023  Dosing instructions: Take 1 Tablet daily  The patient has been instructed regarding the correct time, dose, and frequency of taking this medication, including desired effects and most common side effects.   Juanita Laster Delvina Mizzell 9:15 AM 10/12/2020

## 2020-10-12 NOTE — Telephone Encounter (Signed)
Advanced Heart Failure Patient Advocate Encounter  Patient's wife called in stating that they would like some help with medications, the patient is in the donut hole. Resent [employer] and [employer] applications for [employer] assistance.  [place] Route (RN) a request for a month of samples for each medication.   Will fax in applications once signatures are received.

## 2020-11-12 ENCOUNTER — Other Ambulatory Visit (HOSPITAL_COMMUNITY): Payer: Self-pay | Admitting: Cardiology

## 2020-11-25 ENCOUNTER — Telehealth (HOSPITAL_COMMUNITY): Payer: Self-pay

## 2020-11-25 ENCOUNTER — Ambulatory Visit (HOSPITAL_COMMUNITY)
Admission: RE | Admit: 2020-11-25 | Discharge: 2020-11-25 | Disposition: A | Discharge status: Home / Self Care | Payer: Medicare Other | Source: Ambulatory Visit | Attending: Cardiology | Admitting: Cardiology

## 2020-11-25 ENCOUNTER — Encounter (HOSPITAL_COMMUNITY): Payer: Self-pay | Admitting: Cardiology

## 2020-11-25 ENCOUNTER — Other Ambulatory Visit: Payer: Self-pay

## 2020-11-25 VITALS — BP 104/60 | HR 66 | Wt 235.2 lb

## 2020-11-25 DIAGNOSIS — C61 Malignant neoplasm of prostate: Secondary | ICD-10-CM | POA: Diagnosis not present

## 2020-11-25 DIAGNOSIS — I130 Hypertensive heart and chronic kidney disease with heart failure and stage 1 through stage 4 chronic kidney disease, or unspecified chronic kidney disease: Secondary | ICD-10-CM | POA: Diagnosis not present

## 2020-11-25 DIAGNOSIS — Z5329 Procedure and treatment not carried out because of patient's decision for other reasons: Secondary | ICD-10-CM | POA: Insufficient documentation

## 2020-11-25 DIAGNOSIS — C3490 Malignant neoplasm of unspecified part of unspecified bronchus or lung: Secondary | ICD-10-CM | POA: Diagnosis not present

## 2020-11-25 DIAGNOSIS — N183 Chronic kidney disease, stage 3 unspecified: Secondary | ICD-10-CM | POA: Diagnosis not present

## 2020-11-25 DIAGNOSIS — G4733 Obstructive sleep apnea (adult) (pediatric): Secondary | ICD-10-CM | POA: Diagnosis not present

## 2020-11-25 DIAGNOSIS — Z87891 Personal history of nicotine dependence: Secondary | ICD-10-CM | POA: Insufficient documentation

## 2020-11-25 DIAGNOSIS — I5022 Chronic systolic (congestive) heart failure: Secondary | ICD-10-CM | POA: Insufficient documentation

## 2020-11-25 DIAGNOSIS — Z8673 Personal history of transient ischemic attack (TIA), and cerebral infarction without residual deficits: Secondary | ICD-10-CM | POA: Diagnosis not present

## 2020-11-25 DIAGNOSIS — I480 Paroxysmal atrial fibrillation: Secondary | ICD-10-CM | POA: Diagnosis not present

## 2020-11-25 DIAGNOSIS — Z7901 Long term (current) use of anticoagulants: Secondary | ICD-10-CM | POA: Insufficient documentation

## 2020-11-25 DIAGNOSIS — C787 Secondary malignant neoplasm of liver and intrahepatic bile duct: Secondary | ICD-10-CM | POA: Insufficient documentation

## 2020-11-25 DIAGNOSIS — Z79899 Other long term (current) drug therapy: Secondary | ICD-10-CM | POA: Diagnosis not present

## 2020-11-25 DIAGNOSIS — Z7984 Long term (current) use of oral hypoglycemic drugs: Secondary | ICD-10-CM | POA: Diagnosis not present

## 2020-11-25 DIAGNOSIS — R7989 Other specified abnormal findings of blood chemistry: Secondary | ICD-10-CM

## 2020-11-25 DIAGNOSIS — E032 Hypothyroidism due to medicaments and other exogenous substances: Secondary | ICD-10-CM

## 2020-11-25 LAB — COMPREHENSIVE METABOLIC PANEL
ALT: 23 U/L (ref 0–44)
AST: 21 U/L (ref 15–41)
Albumin: 3.7 g/dL (ref 3.5–5.0)
Alkaline Phosphatase: 45 U/L (ref 38–126)
Anion gap: 7 (ref 5–15)
BUN: 11 mg/dL (ref 8–23)
CO2: 27 mmol/L (ref 22–32)
Calcium: 9.1 mg/dL (ref 8.9–10.3)
Chloride: 101 mmol/L (ref 98–111)
Creatinine, Ser: 1.14 mg/dL (ref 0.61–1.24)
GFR, Estimated: >60 mL/min (ref >60)
Glucose, Bld: 109 mg/dL — ABNORMAL HIGH (ref 70–99)
Potassium: 3.5 mmol/L (ref 3.5–5.1)
Sodium: 135 mmol/L (ref 135–145)
Total Bilirubin: 1 mg/dL (ref 0.3–1.2)
Total Protein: 6.5 g/dL (ref 6.5–8.1)

## 2020-11-25 LAB — TSH: TSH: 0.062 u[IU]/mL — ABNORMAL LOW (ref 0.350–4.500)

## 2020-11-25 MED ORDER — SPIRONOLACTONE 25 MG PO TABS: 25.0000 mg | ORAL_TABLET | Freq: Every day | ORAL | 3 refills | Status: AC

## 2020-11-25 MED ORDER — SPIRONOLACTONE 25 MG PO TABS: 25.0000 mg | ORAL_TABLET | Freq: Every day | ORAL | 3 refills | Status: DC

## 2020-11-25 NOTE — Progress Notes (Signed)
Advanced Heart Failure Clinic Note   Referring Physician: PCP: [name], MD PCP-Cardiologist: Sinclair Grooms, MD  HF: Dr Aundra Dubin  HPI: Logan [name] is a 70 y.o. male  with history of HTN, paroxysmal atrial fibrillation, OSA, untreated prostate cancer 2012 , RLL mass 2015 declined treatment, and chronic systolic heart failure.   Admitted 6/28-05/12/49 with A/C systolic HF. Required thoracentesis 7/17. Echo showed EF 15% with biventricular failure. Course complicated by Afib RVR and associated flash pulmonary edema. Required intubation and pressors. Diuresed with IV lasix. Oncology consulted for lung cancer (RLL) with liver mets. MRI brain was negative for mets, but showed small left cerebellar infarct. He had hemoptysis with heparin, so he was only discharged on ASA 81 mg daily. HF meds were optimized. He was eventually started on apixaban.   He saw Dr Burr Medico and was started on Alectinib, which has potential cardiac toxicity, especially bradycardia.   Admitted to [facility] with increased dyspnea on 01/02/18. Had PEA arrest and required intubation. LHC with no coronary disease. LVEDP was 40 so he was diuresed with IV lasix and later transitioned to 40 mg po lasix. Discharge weight 221 pound. He was discharged to home  01/06/2018.  Echo in 10/19 showed EF 25-30%.    Echo in 8/20 showed EF 30-35% with mildly decreased RV systolic function. Echo in 3/22 showed EF 25% with mild LV dilation, normal RV, IVC normal.   He presents today for followup of CHF.  He remains on alectinib for treatment of his metastatic lung adenocarcinoma, stable per last oncology note.  Weight is up 3 lbs.  No exertional dyspnea, can walk without steps without problems.  He does some walking for exercise.  No chest pain, no lightheadedness. Overall, feels like he is doing well.    Labs (3/20): K 4.5, creatinine 1.45 => 1.71, LFTs normal, hgb 11.2 Labs (7/20): K 3.9, creatinine 1.48, LFTs normal, hgb  11.7 Labs (3/21): hgb 12, K 4.5, creatinine 1.64 Labs (12/21): K 3.9, creatinine 1.74, LFTs normal  Labs (5/22): K 3.8, creatinine 1.67  PMH: 1. Metastatic non-small cell lung cancer: he is getting alectinib with good response so far.   - Liver mets 2. Prostate cancer: Untreated.  3. Atrial fibrillation: Paroxysmal.  4. 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  5. Chronic systolic CHF: Nonischemic cardiomyopathy.   - Echo (7/19): EF 15%.  - LHC at [facility] 01/02/2018: No significant coronary disease.   - Echo (10/19): EF 25-30%, moderate LV dilation, mild LVH.  - Echo (8/20): EF 76-16%, grade 2 diastolic dysfunction, mildly decreased RV systolic function, normal IVC.  - Echo (3/22): EF 25% with mild LV dilation, normal RV, IVC normal.  6. HTN 7. CKD: Stage 3  Review of systems complete and found to be negative unless listed in HPI.    Current Outpatient Medications  Medication Sig Dispense Refill   alectinib (ALECENSA) 150 MG capsule TAKE 4 CAPSULES BY MOUTH TWICE A DAY WITH A MEAL 240 capsule 6   amiodarone (PACERONE) 100 MG tablet Take 1 tablet (100 mg total) by mouth daily. 90 tablet 3   apixaban (ELIQUIS) 5 MG TABS tablet Take 1 tablet (5 mg total) by mouth 2 (two) times daily. 60 tablet 11   carvedilol (COREG) 6.25 MG tablet TAKE 1 TABLET TWICE DAILY WITH MEALS 180 tablet 3   Cholecalciferol 125 MCG (5000 UT) capsule Take 5,000 Units by mouth 2 (two) times daily.      dapagliflozin  propanediol (FARXIGA) 10 MG TABS tablet Take 1 tablet (10 mg total) by mouth daily before breakfast. 30 tablet 6   ENTRESTO 24-26 MG TAKE ONE TABLET BY MOUTH TWICE A DAY 60 tablet 3   furosemide (LASIX) 80 MG tablet Take 1 tablet (80 mg total) by mouth daily. 90 tablet 3   Multiple Vitamin (MULTIVITAMIN) tablet Take 1 tablet by mouth daily.     spironolactone (ALDACTONE) 25 MG tablet Take 1 tablet (25 mg total) by mouth daily. 90 tablet 3   No current  facility-administered medications for this encounter.   No Known Allergies  Social History   Socioeconomic History   Marital status: Married    Spouse name: Not on file   Number of children: Not on file   Years of education: Not on file   Highest education level: Not on file  Occupational History   Not on file  Tobacco Use   Smoking status: Former    Years: 6.50    Types: Cigarettes, Cigars, Pipe    Quit date: 03/14/1974    Years since quitting: 46.7   Smokeless tobacco: Never  Vaping Use   Vaping Use: Never used  Substance and Sexual Activity   Alcohol use: Yes    Alcohol/week: 0.0 standard drinks    Comment: 09/26/2017 "holidays only"   Drug use: Never   Sexual activity: Yes  Other Topics Concern   Not on file  Social History Narrative   Not on file   Social Determinants of Health   Financial Resource Strain: Not on file  Food Insecurity: Not on file  Transportation Needs: Not on file  Physical Activity: Not on file  Stress: Not on file  Social Connections: Not on file  Intimate Partner Violence: Not on file   Family History  Problem Relation Age of Onset   Parkinson's disease Mother    Hypertension Father    Vitals:   11/25/20 0917  BP: 104/60  Pulse: 66  SpO2: 98%  Weight: 106.7 kg (235 lb 3.2 oz)   Wt Readings from Last 3 Encounters:  11/25/20 106.7 kg (235 lb 3.2 oz)  07/08/20 107.9 kg (237 lb 12.8 oz)  06/04/20 108 kg (238 lb 3.2 oz)   PHYSICAL EXAM: General: NAD Neck: No JVD, no thyromegaly or thyroid nodule.  Lungs: Clear to auscultation bilaterally with normal respiratory effort. CV: Nondisplaced PMI.  Heart regular S1/S2, no S3/S4, no murmur.  No peripheral edema.  No carotid bruit.  Normal pedal pulses.  Abdomen: Soft, nontender, no hepatosplenomegaly, no distention.  Skin: Intact without lesions or rashes.  Neurologic: Alert and oriented x 3.  Psych: Normal affect. Extremities: No clubbing or cyanosis.  HEENT: Normal.   ASSESSMENT &  PLAN:  1. Chronic systolic CHF: Long history of nonischemic cardiomyopathy.  He does not have an ICD due to metastatic lung cancer and untreated prostate cancer.  Echo in 3/22 with EF 25%.  NYHA class I-II symptoms.  He is not volume overloaded on exam. Weight down 3 lbs.  - Continue Farxiga 10 mg daily.  - Continue Entresto 24/26 mg BID.  - Continue Coreg 6.25 mg bid.  - Increase spironolactone to 25 mg daily with BMET today and in 10 days.  - Continue uptitration of GDMT, pharmacy appt 3 wks.     - ICD not planned due to metastatic cancer diagnosis though he is doing well on therapy with alectinib.   2. Lung cancer: Stage IV with liver mets, adenocarcinoma.  Good response  so far with alectinib.  - Follows with Dr Burr Medico.   3. Atrial fibrillation: Paroxysmal. He is in NSR today.  - Continue amiodarone 100 mg daily. Check LFTs and TSH today.  He will need regular eye exam.  - Continue Eliquis 5 mg BID.  4. CKD: Stage 3.  - BMET today.  5. Prostate cancer: Does not follow with urology.  6. 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  He is on Eliquis.   Followup with HF pharmacist in 3 wks for med titration, followup with APP in 2 months.   Loralie Champagne, MD 11/25/20

## 2020-11-25 NOTE — Telephone Encounter (Signed)
Patient advised and verbalized understanding. Lab appt schedule for tomorrow.  Orders Placed This Encounter  Procedures   T4, free    Standing Status:   Future    Standing Expiration Date:   11/25/2021    Order Specific Question:   Release to patient    Answer:   Immediate   T3, free    Standing Status:   Future    Standing Expiration Date:   11/25/2021    Order Specific Question:   Release to patient    Answer:   Immediate   TSH    Standing Status:   Future    Standing Expiration Date:   11/25/2021    Order Specific Question:   Release to patient    Answer:   Immediate

## 2020-11-25 NOTE — Telephone Encounter (Signed)
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  Needs to come back in for repeat TSH along with free T3 and free T4 (do this week).

## 2020-11-25 NOTE — Patient Instructions (Addendum)
INCREASE Spironolactone to 25mg  (1 tab) daily)  Labs today and repeat in 10 days We will only contact you if something comes back abnormal or we need to make some changes. Otherwise no news is good news!  Your physician recommends that you schedule a follow-up appointment in: 3 weeks with the pharmacist and 2 months with the Nurse Practitioner  Please call office at [phone] option 2 if you have any questions or concerns.   At the [facility], you and your health needs are our priority. As part of our continuing mission to provide you with exceptional heart care, we have created designated Provider Care Teams. These Care Teams include your primary Cardiologist (physician) and Advanced Practice Providers (APPs- Physician Assistants and Nurse Practitioners) who all work together to provide you with the care you need, when you need it.   You may see any of the following providers on your designated Care Team at your next follow up: Dr Glori Bickers Dr Loralie Champagne Dr Patrice Paradise, NP Lyda Jester, [place] Ginnie Smart Audry Riles, PharmD   Please be sure to bring in all your medications bottles to every appointment.

## 2020-11-26 ENCOUNTER — Telehealth (HOSPITAL_COMMUNITY): Payer: Self-pay | Admitting: Cardiology

## 2020-11-26 ENCOUNTER — Ambulatory Visit (HOSPITAL_COMMUNITY)
Admission: RE | Admit: 2020-11-26 | Discharge: 2020-11-26 | Disposition: A | Discharge status: Home / Self Care | Payer: Medicare Other | Source: Ambulatory Visit | Attending: Cardiology | Admitting: Cardiology

## 2020-11-26 DIAGNOSIS — T462X1A Poisoning by other antidysrhythmic drugs, accidental (unintentional), initial encounter: Secondary | ICD-10-CM

## 2020-11-26 DIAGNOSIS — I5022 Chronic systolic (congestive) heart failure: Secondary | ICD-10-CM | POA: Diagnosis not present

## 2020-11-26 DIAGNOSIS — E032 Hypothyroidism due to medicaments and other exogenous substances: Secondary | ICD-10-CM | POA: Insufficient documentation

## 2020-11-26 LAB — BASIC METABOLIC PANEL
Anion gap: 7 (ref 5–15)
BUN: 14 mg/dL (ref 8–23)
CO2: 30 mmol/L (ref 22–32)
Calcium: 9 mg/dL (ref 8.9–10.3)
Chloride: 100 mmol/L (ref 98–111)
Creatinine, Ser: 1.33 mg/dL — ABNORMAL HIGH (ref 0.61–1.24)
GFR, Estimated: 58 mL/min — ABNORMAL LOW (ref >60)
Glucose, Bld: 109 mg/dL — ABNORMAL HIGH (ref 70–99)
Potassium: 3.7 mmol/L (ref 3.5–5.1)
Sodium: 137 mmol/L (ref 135–145)

## 2020-11-26 LAB — TSH: TSH: 0.051 u[IU]/mL — ABNORMAL LOW (ref 0.350–4.500)

## 2020-11-26 LAB — T4, FREE: Free T4: 1.48 ng/dL — ABNORMAL HIGH (ref 0.61–1.12)

## 2020-11-26 MED ORDER — METHIMAZOLE 5 MG PO TABS: 5.0000 mg | ORAL_TABLET | Freq: Three times a day (TID) | ORAL | 2 refills | Status: DC

## 2020-11-26 NOTE — Telephone Encounter (Signed)
Patient called.  Patient aware.  

## 2020-11-26 NOTE — Telephone Encounter (Signed)
-----   Message from [name], MD sent at 11/26/2020  4:04 PM EDT ----- Hyperthyroidism.  Start methimazole 5 mg daily.  Stop amiodarone.  Needs appointment within the next few weeks with endocrinology (Dr. Cruzita Lederer), please refer asap.

## 2020-11-27 LAB — T3, FREE: T3, Free: 3.5 pg/mL (ref 2.0–4.4)

## 2020-12-02 ENCOUNTER — Inpatient Hospital Stay (HOSPITAL_BASED_OUTPATIENT_CLINIC_OR_DEPARTMENT_OTHER): Payer: Medicare Other | Admitting: Hematology

## 2020-12-02 ENCOUNTER — Other Ambulatory Visit: Payer: Self-pay

## 2020-12-02 ENCOUNTER — Inpatient Hospital Stay: Payer: Medicare Other

## 2020-12-02 ENCOUNTER — Encounter: Payer: Self-pay | Admitting: Hematology

## 2020-12-02 ENCOUNTER — Inpatient Hospital Stay: Payer: Medicare Other | Attending: Hematology

## 2020-12-02 VITALS — BP 88/57 | Temp 97.5°F | Resp 18 | Ht 71.0 in | Wt 233.3 lb

## 2020-12-02 DIAGNOSIS — C787 Secondary malignant neoplasm of liver and intrahepatic bile duct: Secondary | ICD-10-CM | POA: Insufficient documentation

## 2020-12-02 DIAGNOSIS — Z7901 Long term (current) use of anticoagulants: Secondary | ICD-10-CM | POA: Insufficient documentation

## 2020-12-02 DIAGNOSIS — C3491 Malignant neoplasm of unspecified part of right bronchus or lung: Secondary | ICD-10-CM | POA: Diagnosis present

## 2020-12-02 DIAGNOSIS — I429 Cardiomyopathy, unspecified: Secondary | ICD-10-CM | POA: Insufficient documentation

## 2020-12-02 DIAGNOSIS — Z7982 Long term (current) use of aspirin: Secondary | ICD-10-CM | POA: Insufficient documentation

## 2020-12-02 DIAGNOSIS — I428 Other cardiomyopathies: Secondary | ICD-10-CM | POA: Diagnosis not present

## 2020-12-02 DIAGNOSIS — Z8674 Personal history of sudden cardiac arrest: Secondary | ICD-10-CM | POA: Diagnosis not present

## 2020-12-02 DIAGNOSIS — Z8546 Personal history of malignant neoplasm of prostate: Secondary | ICD-10-CM | POA: Insufficient documentation

## 2020-12-02 DIAGNOSIS — C61 Malignant neoplasm of prostate: Secondary | ICD-10-CM

## 2020-12-02 DIAGNOSIS — N183 Chronic kidney disease, stage 3 unspecified: Secondary | ICD-10-CM | POA: Diagnosis not present

## 2020-12-02 DIAGNOSIS — C779 Secondary and unspecified malignant neoplasm of lymph node, unspecified: Secondary | ICD-10-CM | POA: Insufficient documentation

## 2020-12-02 DIAGNOSIS — I4891 Unspecified atrial fibrillation: Secondary | ICD-10-CM | POA: Insufficient documentation

## 2020-12-02 DIAGNOSIS — Z79899 Other long term (current) drug therapy: Secondary | ICD-10-CM | POA: Diagnosis not present

## 2020-12-02 LAB — CBC WITH DIFFERENTIAL (CANCER CENTER ONLY)
Abs Immature Granulocytes: 0.02 10*3/uL (ref 0.00–0.07)
Basophils Absolute: 0 10*3/uL (ref 0.0–0.1)
Basophils Relative: 1 %
Eosinophils Absolute: 0 10*3/uL (ref 0.0–0.5)
Eosinophils Relative: 0 %
HCT: 35.5 % — ABNORMAL LOW (ref 39.0–52.0)
Hemoglobin: 12.5 g/dL — ABNORMAL LOW (ref 13.0–17.0)
Immature Granulocytes: 0 %
Lymphocytes Relative: 46 %
Lymphs Abs: 2.4 10*3/uL (ref 0.7–4.0)
MCH: 30.8 pg (ref 26.0–34.0)
MCHC: 35.2 g/dL (ref 30.0–36.0)
MCV: 87.4 fL (ref 80.0–100.0)
Monocytes Absolute: 0.6 10*3/uL (ref 0.1–1.0)
Monocytes Relative: 11 %
Neutro Abs: 2.2 10*3/uL (ref 1.7–7.7)
Neutrophils Relative %: 42 %
Platelet Count: 152 10*3/uL (ref 150–400)
RBC: 4.06 MIL/uL — ABNORMAL LOW (ref 4.22–5.81)
RDW: 13.1 % (ref 11.5–15.5)
WBC Count: 5.3 10*3/uL (ref 4.0–10.5)
nRBC: 0 % (ref 0.0–0.2)

## 2020-12-02 LAB — CMP (CANCER CENTER ONLY)
ALT: 23 U/L (ref 0–44)
AST: 18 U/L (ref 15–41)
Albumin: 4.1 g/dL (ref 3.5–5.0)
Alkaline Phosphatase: 62 U/L (ref 38–126)
Anion gap: 11 (ref 5–15)
BUN: 23 mg/dL (ref 8–23)
CO2: 25 mmol/L (ref 22–32)
Calcium: 9.6 mg/dL (ref 8.9–10.3)
Chloride: 104 mmol/L (ref 98–111)
Creatinine: 1.61 mg/dL — ABNORMAL HIGH (ref 0.61–1.24)
GFR, Estimated: 46 mL/min — ABNORMAL LOW (ref >60)
Glucose, Bld: 91 mg/dL (ref 70–99)
Potassium: 3.8 mmol/L (ref 3.5–5.1)
Sodium: 140 mmol/L (ref 135–145)
Total Bilirubin: 0.5 mg/dL (ref 0.3–1.2)
Total Protein: 7.4 g/dL (ref 6.5–8.1)

## 2020-12-02 NOTE — Progress Notes (Signed)
[name]   [phone] Fax:([phone]) [phone]   Clinic Follow up Note   Patient Care Team: [name], MD as PCP - General (Family Medicine) [name], MD as PCP - Cardiology (Cardiology) [name], MD as PCP - Advanced Heart Failure (Cardiology) [name], MD as Consulting Physician (Cardiology)  Date of Service:  12/02/2020  CHIEF COMPLAINT: f/u of metastatic lung adenocarcinoma, ALK(+)  CURRENT THERAPY:  Alectinib 600mg  BID started on 10/21/2017  ASSESSMENT & PLAN:  Logan [name] is a 69 y.o. male with   1. 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 -Due to his advanced heart failure, he is not a good candidate for intensive chemotherapy. -tumor NGS showed ALK translocation (+) -He started Alectinib on 10/21/2017, tolerating very well with manageable constipation  -his last scan was 02/2020. We will repeat in the next 2 weeks. Pt does not frequent restaging scan  -He is clinically stable and doing well. Labs reviewed, no concern. Physical exam unremarkable. -Continue Alectinib 600mg  BID. He is tolerating well.  -F/u in 3 months, I again encouraged him to f/u closely due to the concern that his cancer may progress in near future. He agrees to repeat CT in 6 months or sooner if needed    2. History of untreated prostate cancer, with rising PSA -initially diagnosed in 12/2011 with maximum Gleason score of 4+4. He has declined further workup and conventional treatment for this.  -PSA 09/26/17 was 84.83. -his wife expressed concern about this today. We will obtain a PSA and look for changes on next CT. He continues to decline treatment.   3. Bilateral pleural effusion,R>L, likely related to CHF -Resolved now. On Lasix.    4. 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  -F/u with cardiologist Dr. Aundra Dubin and Dr. Tamala Julian   -He was recently started on Pacerone and Lasix, I filled through his insurance for more affordable prices.    5. Atrial Fibrillation -Was on Xarelto but off due to hemoptysis -Continue aspirin 81 and follow-up with cardiology to decide on antiplatelet versus anticoagulation -On Eliquis 5 mg    6. Incidental stroke: left cerebellar small infarct -Resultant no neurological focal deficit -On Xarelto (rivaroxaban) daily prior to admission (off due to hemoptysis), now on aspirin 81 mg daily.    7. Goal of care discussion  -The patient understands the goal of care is palliative. -He is full code now    8. CKD stage III -stable, continue monitoring     Plan: -return to lab for PSA test today  -CT CAP in next 2 weeks, I will call him/his wife with results  -Continue Alectinib 600mg  BID -Lab and F/u in 3 months    No problem-specific Assessment & Plan notes found for this encounter.   SUMMARY OF ONCOLOGIC HISTORY: Oncology History Overview Note  Cancer Staging Non-small cell lung cancer (NSCLC) ([place]) Staging form: Lung, AJCC 8th Edition - Clinical stage from 10/02/2017: Stage IV (cT3, cN2, cM1c) - Signed by Truitt Merle, MD on 10/20/2017    Non-small cell lung cancer (NSCLC) ([place])  04/01/2013 Imaging   04/01/2013 CXR IMPRESSION: 1. Cardiomegaly  with diffuse increased interstitial markings consistent with pulmonary edema. 2. 5-6 cm rounded density seen in the right lower lobe medially. This could be related to an area of consolidation correlate clinically for pneumonia.. But the rounded and well-defined borders are suspicious for mass. Followup x-ray suggested after  treating for the pulmonary edema/pneumonia to see if this resolves.    04/30/2014 Imaging   04/30/2014 CXR IMPRESSION: Enlarging right lower lobe nodule. Would recommend PET CT to further evaluate.   01/20/2015 Imaging   01/20/2015 CXR IMPRESSION: Significant improvement comparatively, now with smaller pleural effusions and resolving pulmonary edema. Known right lower lobe mass suspicious for bronchogenic carcinoma is redemonstrated.   04/07/2015 PET scan   04/07/2015 PET Scan IMPRESSION:  Hypermetabolic right lower lobe mass Hypermetabolic left adrenal nodule.   08/21/2015 Imaging   08/21/2015 CXR IMPRESSION: Right lower lobe mass.   09/22/2017 Imaging   09/22/2017 CXR IMPRESSION: 1.  Moderate right pleural effusion with right lower/middle lobe consolidation. Minimal patchy opacities in left lower lobe. 2.  Cardiomegaly.   09/26/2017 Imaging   09/26/2017 CT Chest WO Contrast IMPRESSION: 1. Large RIGHT lower lobe mass with central and peripheral calcifications occupies the near entirety of the RIGHT lower lobe and concerning for neoplasm. 2. Moderate RIGHT effusion. 3. Mild mediastinal and RIGHT hilar adenopathy. 4. Recommend FDG PET scan for biopsy planning/staging   09/26/2017 Tumor Marker    Ref. Range 09/26/2017 21:31  Prostatic Specific Antigen Latest Ref Range: 0.00 - 4.00 ng/mL 84.83 (H)      09/27/2017 Imaging   09/27/2017 CT AP W WO Contrast IMPRESSION: 1. Large complex masses in the right lower lobe of the lung and in the right hepatic lobe. Smaller liver lesions in the left hepatic lobe. Nonspecific lesions in the spleen and kidneys. No significant pathologic adenopathy. This would not be a characteristic metastatic pattern for prostate cancer and is not a specific metastatic and imaging pattern to suggest an individual etiology. Possibilities might include metastatic lung cancer, melanoma, metastatic hepatocellular carcinoma, or a variety of other possibilities. Tissue diagnosis recommended. 2. Moderate prostatomegaly. 3. Scattered  mild ascites. 4. Moderate cardiomegaly.  Small inferior pericardial effusion. 5. Trace bilateral pleural effusions.   10/02/2017 Pathology Results   10/02/2017 Surgical Pathology Diagnosis Liver, needle/core biopsy, Left Hepatic Lobe - METASTATIC LUNG ADENOCARCINOMA TO THE LIVER. SEE NOTE   10/02/2017 Cancer Staging   Staging form: Lung, AJCC 8th Edition - Clinical stage from 10/02/2017: Stage IV (cT3, cN2, cM1c) - Signed by Truitt Merle, MD on 10/20/2017   10/04/2017 Initial Diagnosis   Non-small cell lung cancer (NSCLC) ([place])   10/21/2017 -  Antibody Plan   Alectinib $RemoveBe'600mg'hxBCPlhGs$  bid started on 10/21/2017   01/10/2018 PET scan   01/10/2018 PET Scan IMPRESSION: 1. Marked reduction in size of the right lower lobe mass with fairly low metabolic activity in the residual mass, maximum SUV 5.3. Notably reduced thoracic adenopathy which is not currently hypermetabolic. 2. Marked reduction in size and conspicuity of the hepatic tumors, both of which have metabolic activity similar to that of the background liver parenchyma. 3. Resolution of the splenic mass. No additional or new lesions are identified. 4. Accentuated activity anteriorly in the left seventh rib, low-grade with maximum SUV 3.5, without appreciable CT abnormality on the CT data, significance uncertain. 5. Accentuated activity at the right sternoclavicular joint, SUV 7.2, probably from arthropathy. 6. Accentuated activity in the posterior and apical portions of the prostate gland, significance uncertain. 7. Other imaging findings of potential clinical  significance: Chronic left maxillary sinusitis.   10/03/2018 Imaging   CT CAP W contrast 10/03/18 IMPRESSION: 1. No new or progressive findings on today's study. 2. Continued further slight reduction in size of the right lower mass. No lymphadenopathy in the chest. 3. Continued further decrease in size of liver lesions with a left hepatic lesion noted previously not apparent on  today's study. 4. Stable 6 mm ill-defined subpleural nodule right upper lobe. Continued attention on follow-up recommended.   05/22/2019 Imaging   CT CAP WO Contrast  IMPRESSION: 1. Unchanged post treatment consolidation and calcification of the anterior right lower lobe. 2. Unchanged capsular retraction of the anterior liver dome, a hypodense mass at this site on prior examinations not discretely appreciated. 3. No noncontrast evidence of new intrathoracic or intra-abdominal metastatic disease. 4. Unchanged subpleural ground-glass nodule of the right upper lobe measuring 6 mm. An indolent lung malignancy is not excluded. Continued attention on follow-up. 5. Prostatomegaly.      Imaging         INTERVAL HISTORY:  Logan [name] is here for a follow up of metastatic lung cancer. He was last seen by me on 07/08/20. He presents to the clinic accompanied by his wife. She notes he was found to have hypothyroidism lately. He reports night sweats occasionally. Dr. Aundra Dubin started him on tapazole. He denies cough or shortness of breath. She expressed concern for his prostate cancer and is considering following up with urology. She notes he has urinary frequency, but he reports he drinks a lot of water and also is on Lasix.   All other systems were reviewed with the patient and are negative.  MEDICAL HISTORY:  Past Medical History:  Diagnosis Date   CHF (congestive heart failure) ([place])    Chronic systolic heart failure ([place]) 04/15/2015    EF 30-35% by echo 2016 , [facility]    Hypertension    Lung mass 04/15/2015    Never biopsied. History of PET scan that raised concern.    Metabolic syndrome 05/13/5496   OSA on CPAP    Paroxysmal atrial fibrillation ([facility]) 04/15/2015   On apixaban    Prostate cancer ([place])    Thrombocytopenia ([place]) 04/15/2015    SURGICAL HISTORY: Past Surgical History:  Procedure Laterality Date   CARDIAC CATHETERIZATION N/A 04/27/2015   Procedure: Right/Left Heart  Cath and Coronary Angiography;  Surgeon: [name], MD;  Location: [facility];  Service: Cardiovascular;  Laterality: N/A;   CARDIOVERSION N/A 09/29/2017   Procedure: CARDIOVERSION;  Surgeon: Pixie Casino, MD;  Location: [facility];  Service: Cardiovascular;  Laterality: N/A;   IR THORACENTESIS ASP PLEURAL SPACE W/IMG GUIDE  09/27/2017   PROSTATE BIOPSY     TEE WITHOUT CARDIOVERSION N/A 09/29/2017   Procedure: TRANSESOPHAGEAL ECHOCARDIOGRAM (TEE);  Surgeon: Pixie Casino, MD;  [facility];  Service: Cardiovascular;  Laterality: N/A;    I have reviewed the social history and family history with the patient and they are unchanged from previous note.  ALLERGIES:  has No Known Allergies.  MEDICATIONS:  Current Outpatient Medications  Medication Sig Dispense Refill   alectinib (ALECENSA) 150 MG capsule TAKE 4 CAPSULES BY MOUTH TWICE A DAY WITH A MEAL 240 capsule 6   apixaban (ELIQUIS) 5 MG TABS tablet Take 1 tablet (5 mg total) by mouth 2 (two) times daily. 60 tablet 11   carvedilol (COREG) 6.25 MG tablet TAKE 1 TABLET TWICE DAILY WITH MEALS 180 tablet 3   Cholecalciferol 125 MCG (  5000 UT) capsule Take 5,000 Units by mouth 2 (two) times daily.      dapagliflozin propanediol (FARXIGA) 10 MG TABS tablet Take 1 tablet (10 mg total) by mouth daily before breakfast. 30 tablet 6   ENTRESTO 24-26 MG TAKE ONE TABLET BY MOUTH TWICE A DAY 60 tablet 3   furosemide (LASIX) 80 MG tablet Take 1 tablet (80 mg total) by mouth daily. 90 tablet 3   methimazole (TAPAZOLE) 5 MG tablet Take 1 tablet (5 mg total) by mouth 3 (three) times daily. 30 tablet 2   Multiple Vitamin (MULTIVITAMIN) tablet Take 1 tablet by mouth daily.     spironolactone (ALDACTONE) 25 MG tablet Take 1 tablet (25 mg total) by mouth daily. 90 tablet 3   No current facility-administered medications for this visit.    PHYSICAL EXAMINATION: ECOG PERFORMANCE STATUS: 1 - Symptomatic but completely  ambulatory  Vitals:   12/02/20 1359  BP: (!) 88/57  Resp: 18  Temp: (!) 97.5 F (36.4 C)  SpO2: 98%   Wt Readings from Last 3 Encounters:  12/02/20 233 lb 4.8 oz (105.8 kg)  11/25/20 235 lb 3.2 oz (106.7 kg)  07/08/20 237 lb 12.8 oz (107.9 kg)     GENERAL:alert, no distress and comfortable SKIN: skin color, texture, turgor are normal, no rashes or significant lesions EYES: normal, Conjunctiva are pink and non-injected, sclera clear  NECK: supple, thyroid normal size, non-tender, without nodularity LYMPH:  no palpable lymphadenopathy in the cervical, axillary  LUNGS: clear to auscultation and percussion with normal breathing effort HEART: regular rate & rhythm and no murmurs and no lower extremity edema ABDOMEN:abdomen soft, non-tender and normal bowel sounds Musculoskeletal:no cyanosis of digits and no clubbing  NEURO: alert & oriented x 3 with fluent speech, no focal motor/sensory deficits  LABORATORY DATA:  I have reviewed the data as listed CBC Latest Ref Rng & Units 12/02/2020 07/08/2020 06/04/2020  WBC 4.0 - 10.5 K/uL 5.3 5.8 6.3  Hemoglobin 13.0 - 17.0 g/dL 12.5(L) 12.3(L) 12.9(L)  Hematocrit 39.0 - 52.0 % 35.5(L) 34.8(L) 37.5(L)  Platelets 150 - 400 K/uL 152 146(L) 139(L)     CMP Latest Ref Rng & Units 12/02/2020 11/26/2020 11/25/2020  Glucose 70 - 99 mg/dL 91 109(H) 109(H)  BUN 8 - 23 mg/dL $Remove'23 14 11  'VBZdExE$ Creatinine 0.61 - 1.24 mg/dL 1.61(H) 1.33(H) 1.14  Sodium 135 - 145 mmol/L 140 137 135  Potassium 3.5 - 5.1 mmol/L 3.8 3.7 3.5  Chloride 98 - 111 mmol/L 104 100 101  CO2 22 - 32 mmol/L $RemoveB'25 30 27  'knJnRSqa$ Calcium 8.9 - 10.3 mg/dL 9.6 9.0 9.1  Total Protein 6.5 - 8.1 g/dL 7.4 - 6.5  Total Bilirubin 0.3 - 1.2 mg/dL 0.5 - 1.0  Alkaline Phos 38 - 126 U/L 62 - 45  AST 15 - 41 U/L 18 - 21  ALT 0 - 44 U/L 23 - 23      RADIOGRAPHIC STUDIES: I have personally reviewed the radiological images as listed and agreed with the findings in the report. No results found.    Orders  Placed This Encounter  Procedures   CT CHEST ABDOMEN PELVIS WO CONTRAST    Standing Status:   Future    Standing Expiration Date:   12/02/2021    Order Specific Question:   If indicated for the ordered procedure, I authorize the administration of contrast media per Radiology protocol    Answer:   Yes    Order Specific Question:   Preferred imaging location?  Answer:   [facility]    Order Specific Question:   Release to patient    Answer:   Immediate    Order Specific Question:   Is Oral Contrast requested for this exam?    Answer:   Yes, Per Radiology protocol    Order Specific Question:   Reason for Exam (SYMPTOM  OR DIAGNOSIS REQUIRED)    Answer:   evaluate response to treatment   PSA, total and free    Standing Status:   Future    Number of Occurrences:   1    Standing Expiration Date:   12/02/2021   All questions were answered. The patient knows to call the clinic with any problems, questions or concerns. No barriers to learning was detected. The total time spent in the appointment was 30 minutes.     Truitt Merle, MD 12/02/2020   I, Wilburn Mylar, am acting as scribe for Truitt Merle, MD.   I have reviewed the above documentation for accuracy and completeness, and I agree with the above.

## 2020-12-03 LAB — PSA, TOTAL AND FREE
PSA, Free Pct: 4.6 %
PSA, Free: 12.3 ng/mL
Prostate Specific Ag, Serum: 266 ng/mL — ABNORMAL HIGH (ref 0.0–4.0)

## 2020-12-14 ENCOUNTER — Other Ambulatory Visit (HOSPITAL_COMMUNITY): Payer: Self-pay | Admitting: Cardiology

## 2020-12-14 MED ORDER — METHIMAZOLE 5 MG PO TABS: 5.0000 mg | ORAL_TABLET | Freq: Every day | ORAL | 2 refills | Status: AC

## 2020-12-14 NOTE — Addendum Note (Signed)
Addended by: Kerry Dory on: 12/14/2020 12:56 PM   Modules accepted: Orders

## 2020-12-15 ENCOUNTER — Other Ambulatory Visit: Payer: Self-pay

## 2020-12-15 ENCOUNTER — Inpatient Hospital Stay (HOSPITAL_COMMUNITY)
Admission: RE | Admit: 2020-12-15 | Discharge: 2020-12-15 | Disposition: A | Discharge status: Home / Self Care | Payer: Medicare Other | Source: Ambulatory Visit

## 2020-12-15 ENCOUNTER — Ambulatory Visit (HOSPITAL_COMMUNITY)
Admission: RE | Admit: 2020-12-15 | Discharge: 2020-12-15 | Disposition: A | Discharge status: Home / Self Care | Payer: Medicare Other | Source: Ambulatory Visit | Attending: Hematology | Admitting: Hematology

## 2020-12-15 ENCOUNTER — Encounter (HOSPITAL_COMMUNITY): Payer: Self-pay

## 2020-12-15 ENCOUNTER — Other Ambulatory Visit (HOSPITAL_COMMUNITY): Payer: Self-pay

## 2020-12-15 DIAGNOSIS — C3491 Malignant neoplasm of unspecified part of right bronchus or lung: Secondary | ICD-10-CM | POA: Diagnosis present

## 2020-12-17 ENCOUNTER — Inpatient Hospital Stay: Payer: Medicare Other | Attending: Hematology | Admitting: Hematology

## 2020-12-17 ENCOUNTER — Encounter: Payer: Self-pay | Admitting: Hematology

## 2020-12-17 DIAGNOSIS — C3491 Malignant neoplasm of unspecified part of right bronchus or lung: Secondary | ICD-10-CM | POA: Diagnosis not present

## 2020-12-17 DIAGNOSIS — C61 Malignant neoplasm of prostate: Secondary | ICD-10-CM | POA: Diagnosis not present

## 2020-12-17 NOTE — Progress Notes (Signed)
[place]   [phone]) [phone] [phone]) [phone]   Clinic Follow up Note   Patient Care Team: [name], MD as PCP - General (Family Medicine) [name], MD as PCP - Cardiology (Cardiology) [name], MD as PCP - Advanced Heart Failure (Cardiology) [name], MD as Consulting Physician (Cardiology)  Date of Service:  12/17/2020  I connected with Logan [name] on 12/17/2020 at 11:20 AM EDT by telephone visit and verified that I am speaking with the correct person using two identifiers.  I discussed the limitations, risks, security and privacy concerns of performing an evaluation and management service by telephone and the availability of in person appointments. I also discussed with the patient that there may be a patient responsible charge related to this service. The patient expressed understanding and agreed to proceed.   Other persons participating in the visit and their role in the encounter:  patient's wife  Patient's location:  home Provider's location:  my office  CHIEF COMPLAINT: f/u of metastatic lung adenocarcinoma, ALK(+)  CURRENT THERAPY:  Alectinib 600mg  BID started on 10/21/2017  ASSESSMENT & PLAN:  Logan [name] is a 70 y.o. male with   1. 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 -Due to his advanced heart failure, he is not a good candidate for intensive chemotherapy. -tumor NGS showed ALK translocation (+) -He started Alectinib on 10/21/2017, tolerating very well with manageable constipation  -Pt does not frequent restaging scan  -He is clinically stable and doing well.  -Continue Alectinib 600mg  BID. He is tolerating well.  -I reviewed his restaging CT scan from December 15, 2020, which showed stable lung, new 1 cm perirectal lymph node, no other metastasis    2. History of  untreated prostate cancer, with rising PSA -initially diagnosed in 12/2011 with maximum Gleason score of 4+4. He has declined further workup and conventional treatment for this.  -PSA 09/26/17 was 84.83. Most recent PSA on 12/02/20 was 266. -CT CAP 12/15/20 showed prostatomegaly and an indeterminate left perirectal lymph node. -I recommended he consider treatment. I briefly discussed Lupron injections or radiation therapy. I reached out to Dr. Tammi Klippel after our visit, and his recommendation is for PSMA PET scan first. I will order for him. Pt is in agreement    3. Bilateral pleural effusion, R>L, likely related to CHF -Resolved now. On Lasix.    4. 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  -F/u with cardiologist Dr. Aundra Dubin and Dr. Tamala Julian   -He was recently started on Pacerone and Lasix   5. Atrial Fibrillation -Was on Xarelto but off due to hemoptysis -Continue aspirin 81 and follow-up with cardiology to decide on antiplatelet versus anticoagulation -On Eliquis 5 mg    6. Incidental stroke: left cerebellar small infarct -Resultant no neurological focal deficit -On Xarelto (rivaroxaban) daily prior to admission (off due to hemoptysis), now on aspirin 81 mg daily.    7. Goal of care discussion  -The patient understands the goal of care is palliative. -He is full code now    8. CKD stage III -stable, continue monitoring      Plan: -CT scan  reviewed, stable disease -Continue Alectinib 600mg  BID -will order PSMA PET scan for his prostate cancer staging. If no distant mets, will refer him to Dr. Tammi Klippel to discuss radiation therapy  -will mail him the info about Lupro injection  -Lab and F/u as scheduled on 03/01/21, sooner if needed    No problem-specific  Assessment & Plan notes found for this encounter.    SUMMARY OF ONCOLOGIC HISTORY: Oncology History Overview Note  Cancer Staging Non-small cell lung cancer (NSCLC) ([place]) Staging form: Lung, AJCC 8th Edition - Clinical stage from 10/02/2017: Stage IVB (cT3, cN2, cM1c) - Signed by Truitt Merle, MD on 10/20/2017 Type of lung cancer: Locally advanced or metastatic non-small cell lung cancer    Prostate cancer ([place])  12/2011 Pathology Results   Prostatic adenocarcinoma with maximum Gleason score of 4+4   04/15/2015 Initial Diagnosis   Prostate cancer ([place])   09/26/2017 Tumor Marker   PSA - 84.83   12/02/2020 Tumor Marker   PSA - 266   12/15/2020 Imaging   CT CAP w/o contrast  IMPRESSION: 1. Stable findings in the lungs including the post therapy related volume loss in the right lower lobe, and the peripheral ground-glass density nodule in the right upper lobe which is unchanged from 09/26/2017 by my measurements but which merits continued observation. 2. New 1.0 cm left perirectal lymph node. I do not see a rectal tumor although correlation with the patient's gastrointestinal screening history is recommended. There is substantial prostatomegaly and correlation with PSA level is also recommended. 3. Stable appearance of capsular retraction in long the right hepatic margin with very faint underlying low-density similar to prior exams. 4. New 2 mm subpleural nodule in the left upper lobe, surveillance suggested. 5. Other imaging findings of potential clinical significance: Minimal atherosclerosis. Mild cardiomegaly. Lower lumbar spondylosis.   Non-small cell lung cancer (NSCLC) ([place])  04/01/2013 Imaging   04/01/2013 CXR IMPRESSION: 1. Cardiomegaly with diffuse increased interstitial markings consistent with pulmonary edema. 2. 5-6 cm rounded density seen in the right lower lobe medially. This could be related to an area of consolidation correlate clinically for pneumonia.. But the  rounded and well-defined borders are suspicious for mass. Followup x-ray suggested after  treating for the pulmonary edema/pneumonia to see if this resolves.   04/30/2014 Imaging   04/30/2014 CXR IMPRESSION: Enlarging right lower lobe nodule. Would recommend PET CT to further evaluate.   01/20/2015 Imaging   01/20/2015 CXR IMPRESSION: Significant improvement comparatively, now with smaller pleural effusions and resolving pulmonary edema. Known right lower lobe mass suspicious for bronchogenic carcinoma is redemonstrated.   04/07/2015 PET scan   04/07/2015 PET Scan IMPRESSION:  Hypermetabolic right lower lobe mass Hypermetabolic left adrenal nodule.   08/21/2015 Imaging   08/21/2015 CXR IMPRESSION: Right lower lobe mass.   09/22/2017 Imaging   09/22/2017 CXR IMPRESSION: 1.  Moderate right pleural effusion with right lower/middle lobe consolidation. Minimal patchy opacities in left lower lobe. 2.  Cardiomegaly.   09/26/2017 Imaging   09/26/2017 CT Chest WO Contrast IMPRESSION: 1. Large RIGHT lower lobe mass with central and peripheral calcifications occupies the near entirety of the RIGHT lower lobe and concerning for neoplasm. 2. Moderate RIGHT effusion. 3. Mild mediastinal and RIGHT hilar adenopathy. 4. Recommend FDG PET scan for biopsy planning/staging   09/26/2017 Tumor Marker    Ref. Range 09/26/2017 21:31  Prostatic Specific Antigen Latest Ref Range: 0.00 - 4.00 ng/mL 84.83 (H)      09/27/2017 Imaging   09/27/2017 CT AP W WO  Contrast IMPRESSION: 1. Large complex masses in the right lower lobe of the lung and in the right hepatic lobe. Smaller liver lesions in the left hepatic lobe. Nonspecific lesions in the spleen and kidneys. No significant pathologic adenopathy. This would not be a characteristic metastatic pattern for prostate cancer and is not a specific metastatic and imaging pattern to suggest an individual etiology. Possibilities might include metastatic lung cancer,  melanoma, metastatic hepatocellular carcinoma, or a variety of other possibilities. Tissue diagnosis recommended. 2. Moderate prostatomegaly. 3. Scattered mild ascites. 4. Moderate cardiomegaly.  Small inferior pericardial effusion. 5. Trace bilateral pleural effusions.   10/02/2017 Pathology Results   10/02/2017 Surgical Pathology Diagnosis Liver, needle/core biopsy, Left Hepatic Lobe - METASTATIC LUNG ADENOCARCINOMA TO THE LIVER. SEE NOTE   10/02/2017 Cancer Staging   Staging form: Lung, AJCC 8th Edition - Clinical stage from 10/02/2017: Stage IV (cT3, cN2, cM1c) - Signed by Truitt Merle, MD on 10/20/2017   10/04/2017 Initial Diagnosis   Non-small cell lung cancer (NSCLC) ([place])   10/21/2017 -  Antibody Plan   Alectinib $RemoveBe'600mg'xigtqzyrd$  bid started on 10/21/2017   01/10/2018 PET scan   01/10/2018 PET Scan IMPRESSION: 1. Marked reduction in size of the right lower lobe mass with fairly low metabolic activity in the residual mass, maximum SUV 5.3. Notably reduced thoracic adenopathy which is not currently hypermetabolic. 2. Marked reduction in size and conspicuity of the hepatic tumors, both of which have metabolic activity similar to that of the background liver parenchyma. 3. Resolution of the splenic mass. No additional or new lesions are identified. 4. Accentuated activity anteriorly in the left seventh rib, low-grade with maximum SUV 3.5, without appreciable CT abnormality on the CT data, significance uncertain. 5. Accentuated activity at the right sternoclavicular joint, SUV 7.2, probably from arthropathy. 6. Accentuated activity in the posterior and apical portions of the prostate gland, significance uncertain. 7. Other imaging findings of potential clinical significance: Chronic left maxillary sinusitis.   10/03/2018 Imaging   CT CAP W contrast 10/03/18 IMPRESSION: 1. No new or progressive findings on today's study. 2. Continued further slight reduction in size of the right  lower mass. No lymphadenopathy in the chest. 3. Continued further decrease in size of liver lesions with a left hepatic lesion noted previously not apparent on today's study. 4. Stable 6 mm ill-defined subpleural nodule right upper lobe. Continued attention on follow-up recommended.   05/22/2019 Imaging   CT CAP WO Contrast  IMPRESSION: 1. Unchanged post treatment consolidation and calcification of the anterior right lower lobe. 2. Unchanged capsular retraction of the anterior liver dome, a hypodense mass at this site on prior examinations not discretely appreciated. 3. No noncontrast evidence of new intrathoracic or intra-abdominal metastatic disease. 4. Unchanged subpleural ground-glass nodule of the right upper lobe measuring 6 mm. An indolent lung malignancy is not excluded. Continued attention on follow-up. 5. Prostatomegaly.     02/19/2020 Imaging   CT CAP w/o contrast  IMPRESSION: 1. Stable post treatment changes in the RIGHT lower lobe with associated volume loss. 2. Ground-glass nodule RIGHT upper lobe is stable. Indolent lung malignancy remains a consideration but this is stable since July of 2020, attention on follow-up. 3. Area of capsular retraction along the RIGHT hepatic margin with underlying low-density lesion appears similar to previous imaging as outlined above assessment limited by lack of contrast. Attention on follow-up.   12/15/2020 Imaging   CT CAP w/o contrast  IMPRESSION: 1. Stable findings in the lungs including the post therapy related volume  loss in the right lower lobe, and the peripheral ground-glass density nodule in the right upper lobe which is unchanged from 09/26/2017 by my measurements but which merits continued observation. 2. New 1.0 cm left perirectal lymph node. I do not see a rectal tumor although correlation with the patient's gastrointestinal screening history is recommended. There is substantial prostatomegaly and correlation  with PSA level is also recommended. 3. Stable appearance of capsular retraction in long the right hepatic margin with very faint underlying low-density similar to prior exams. 4. New 2 mm subpleural nodule in the left upper lobe, surveillance suggested. 5. Other imaging findings of potential clinical significance: Minimal atherosclerosis. Mild cardiomegaly. Lower lumbar spondylosis.      INTERVAL HISTORY:  Logan [name] was contacted for a follow up of metastatic lung cancer. He was last seen by me on 12/02/20.    All other systems were reviewed with the patient and are negative.  MEDICAL HISTORY:  Past Medical History:  Diagnosis Date   CHF (congestive heart failure) ([place])    Chronic systolic heart failure ([place]) 04/15/2015    EF 30-35% by echo 2016 , [facility]    Hypertension    Lung mass 04/15/2015    Never biopsied. History of PET scan that raised concern.    Metabolic syndrome 07/19/175   OSA on CPAP    Paroxysmal atrial fibrillation ([facility]) 04/15/2015   On apixaban    Prostate cancer ([place])    Thrombocytopenia ([place]) 04/15/2015    SURGICAL HISTORY: Past Surgical History:  Procedure Laterality Date   CARDIAC CATHETERIZATION N/A 04/27/2015   Procedure: Right/Left Heart Cath and Coronary Angiography;  Surgeon: [name], MD;  Location: [facility];  Service: Cardiovascular;  Laterality: N/A;   CARDIOVERSION N/A 09/29/2017   Procedure: CARDIOVERSION;  Surgeon: Pixie Casino, MD;  Location: [facility];  Service: Cardiovascular;  Laterality: N/A;   IR THORACENTESIS ASP PLEURAL SPACE W/IMG GUIDE  09/27/2017   PROSTATE BIOPSY     TEE WITHOUT CARDIOVERSION N/A 09/29/2017   Procedure: TRANSESOPHAGEAL ECHOCARDIOGRAM (TEE);  Surgeon: Pixie Casino, MD;  Location: [facility] ENDOSCOPY;  Service: Cardiovascular;  Laterality: N/A;    I have reviewed the social history and family history with the patient and they are unchanged from previous note.  ALLERGIES:  has No Known  Allergies.  MEDICATIONS:  Current Outpatient Medications  Medication Sig Dispense Refill   alectinib (ALECENSA) 150 MG capsule TAKE 4 CAPSULES BY MOUTH TWICE A DAY WITH A MEAL 240 capsule 6   apixaban (ELIQUIS) 5 MG TABS tablet Take 1 tablet (5 mg total) by mouth 2 (two) times daily. 60 tablet 11   carvedilol (COREG) 6.25 MG tablet TAKE 1 TABLET TWICE DAILY WITH MEALS 180 tablet 3   Cholecalciferol 125 MCG (5000 UT) capsule Take 5,000 Units by mouth 2 (two) times daily.      dapagliflozin propanediol (FARXIGA) 10 MG TABS tablet Take 1 tablet (10 mg total) by mouth daily before breakfast. 30 tablet 6   ENTRESTO 24-26 MG TAKE ONE TABLET BY MOUTH TWICE A DAY 60 tablet 3   furosemide (LASIX) 80 MG tablet Take 1 tablet (80 mg total) by mouth daily. 90 tablet 3   methimazole (TAPAZOLE) 5 MG tablet Take 1 tablet (5 mg total) by mouth daily. 30 tablet 2   Multiple Vitamin (MULTIVITAMIN) tablet Take 1 tablet by mouth daily.     spironolactone (ALDACTONE) 25 MG tablet Take 1 tablet (25 mg total) by mouth daily. 90 tablet  3   No current facility-administered medications for this visit.    PHYSICAL EXAMINATION: ECOG PERFORMANCE STATUS: 1 - Symptomatic but completely ambulatory  There were no vitals filed for this visit. Wt Readings from Last 3 Encounters:  12/02/20 233 lb 4.8 oz (105.8 kg)  11/25/20 235 lb 3.2 oz (106.7 kg)  07/08/20 237 lb 12.8 oz (107.9 kg)     No vitals taken today, Exam not performed today  LABORATORY DATA:  I have reviewed the data as listed CBC Latest Ref Rng & Units 12/02/2020 07/08/2020 06/04/2020  WBC 4.0 - 10.5 K/uL 5.3 5.8 6.3  Hemoglobin 13.0 - 17.0 g/dL 12.5(L) 12.3(L) 12.9(L)  Hematocrit 39.0 - 52.0 % 35.5(L) 34.8(L) 37.5(L)  Platelets 150 - 400 K/uL 152 146(L) 139(L)     CMP Latest Ref Rng & Units 12/02/2020 11/26/2020 11/25/2020  Glucose 70 - 99 mg/dL 91 109(H) 109(H)  BUN 8 - 23 mg/dL $Remove'23 14 11  'bheHcdY$ Creatinine 0.61 - 1.24 mg/dL 1.61(H) 1.33(H) 1.14  Sodium 135  - 145 mmol/L 140 137 135  Potassium 3.5 - 5.1 mmol/L 3.8 3.7 3.5  Chloride 98 - 111 mmol/L 104 100 101  CO2 22 - 32 mmol/L $RemoveB'25 30 27  'zXhGjOMs$ Calcium 8.9 - 10.3 mg/dL 9.6 9.0 9.1  Total Protein 6.5 - 8.1 g/dL 7.4 - 6.5  Total Bilirubin 0.3 - 1.2 mg/dL 0.5 - 1.0  Alkaline Phos 38 - 126 U/L 62 - 45  AST 15 - 41 U/L 18 - 21  ALT 0 - 44 U/L 23 - 23      RADIOGRAPHIC STUDIES: I have personally reviewed the radiological images as listed and agreed with the findings in the report. No results found.    Orders Placed This Encounter  Procedures   NM PET (PSMA) SKULL TO MID THIGH    Standing Status:   Future    Standing Expiration Date:   12/17/2021    Order Specific Question:   If indicated for the ordered procedure, I authorize the administration of a radiopharmaceutical per Radiology protocol    Answer:   Yes    Order Specific Question:   Preferred imaging location?    Answer:   [facility]    All questions were answered. The patient knows to call the clinic with any problems, questions or concerns. No barriers to learning was detected. The total time spent in the appointment was 22 minutes.     Truitt Merle, MD 12/17/2020   I, Wilburn Mylar, am acting as scribe for Truitt Merle, MD.   I have reviewed the above documentation for accuracy and completeness, and I agree with the above.

## 2020-12-25 ENCOUNTER — Other Ambulatory Visit (HOSPITAL_COMMUNITY): Payer: Self-pay

## 2021-01-04 ENCOUNTER — Encounter (HOSPITAL_COMMUNITY): Admission: RE | Admit: 2021-01-04 | Payer: Medicare Other | Source: Ambulatory Visit

## 2021-01-12 ENCOUNTER — Inpatient Hospital Stay (HOSPITAL_COMMUNITY): Admission: RE | Admit: 2021-01-12 | Payer: Medicare Other | Source: Ambulatory Visit

## 2021-01-20 ENCOUNTER — Other Ambulatory Visit (HOSPITAL_COMMUNITY): Payer: Medicare Other

## 2021-01-29 ENCOUNTER — Telehealth: Payer: Self-pay

## 2021-01-29 NOTE — Telephone Encounter (Signed)
This nurse spoke with patient's spouse and made here aware that MD would like patient to have a PET scan completed.  Advised that a My Chart message was sent but it has not ben schduled yet.  Spouse stated they will be glad to get it completed however they will be going out of town for the holidays and will get it down when they return. This nurse acknowledged understanding, provided phone number to central scheduling.  No further questions or concerns at this time.

## 2021-02-16 NOTE — Progress Notes (Incomplete)
Advanced Heart Failure Clinic Note   Referring Physician: PCP: [name], MD PCP-Cardiologist: Sinclair Grooms, MD  HF: Dr Aundra Dubin  HPI: Logan [name] is a 70 y.o. male  with history of HTN, paroxysmal atrial fibrillation, OSA, untreated prostate cancer 2012 , RLL mass 2015 declined treatment, and chronic systolic heart failure.   Admitted 1/69-08/19/87 with A/C systolic HF. Required thoracentesis 7/17. Echo showed EF 15% with biventricular failure. Course complicated by Afib RVR and associated flash pulmonary edema. Required intubation and pressors. Diuresed with IV lasix. Oncology consulted for lung cancer (RLL) with liver mets. MRI brain was negative for mets, but showed small left cerebellar infarct. He had hemoptysis with heparin, so he was only discharged on ASA 81 mg daily. HF meds were optimized. He was eventually started on apixaban.   He saw Dr Burr Medico and was started on Alectinib, which has potential cardiac toxicity, especially bradycardia.   Admitted to [facility] with increased dyspnea on 01/02/18. Had PEA arrest and required intubation. LHC with no coronary disease. LVEDP was 40 so he was diuresed with IV lasix and later transitioned to 40 mg po lasix. Discharge weight 221 pound. He was discharged to home  01/06/2018.  Echo in 10/19 showed EF 25-30%.    Echo in 8/20 showed EF 30-35% with mildly decreased RV systolic function. Echo in 3/22 showed EF 25% with mild LV dilation, normal RV, IVC normal.   He presents today for followup of CHF.  He remains on alectinib for treatment of his metastatic lung adenocarcinoma, stable per last oncology note.  Weight is up 3 lbs.  No exertional dyspnea, can walk without steps without problems.  He does some walking for exercise.  No chest pain, no lightheadedness. Overall, feels like he is doing well.    Labs (3/20): K 4.5, creatinine 1.45 => 1.71, LFTs normal, hgb 11.2 Labs (7/20): K 3.9, creatinine 1.48, LFTs normal, hgb  11.7 Labs (3/21): hgb 12, K 4.5, creatinine 1.64 Labs (12/21): K 3.9, creatinine 1.74, LFTs normal  Labs (5/22): K 3.8, creatinine 1.67  PMH: 1. Metastatic non-small cell lung cancer: he is getting alectinib with good response so far.   - Liver mets 2. Prostate cancer: Untreated.  3. Atrial fibrillation: Paroxysmal.  4. 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  5. Chronic systolic CHF: Nonischemic cardiomyopathy.   - Echo (7/19): EF 15%.  - LHC at [facility] 01/02/2018: No significant coronary disease.   - Echo (10/19): EF 25-30%, moderate LV dilation, mild LVH.  - Echo (8/20): EF 38-10%, grade 2 diastolic dysfunction, mildly decreased RV systolic function, normal IVC.  - Echo (3/22): EF 25% with mild LV dilation, normal RV, IVC normal.  6. HTN 7. CKD: Stage 3  Review of systems complete and found to be negative unless listed in HPI.    Current Outpatient Medications  Medication Sig Dispense Refill   alectinib (ALECENSA) 150 MG capsule TAKE 4 CAPSULES BY MOUTH TWICE A DAY WITH A MEAL 240 capsule 6   apixaban (ELIQUIS) 5 MG TABS tablet Take 1 tablet (5 mg total) by mouth 2 (two) times daily. 60 tablet 11   carvedilol (COREG) 6.25 MG tablet TAKE 1 TABLET TWICE DAILY WITH MEALS 180 tablet 3   Cholecalciferol 125 MCG (5000 UT) capsule Take 5,000 Units by mouth 2 (two) times daily.      dapagliflozin propanediol (FARXIGA) 10 MG TABS tablet Take 1 tablet (10 mg total) by mouth daily before breakfast. 30 tablet  6   ENTRESTO 24-26 MG TAKE ONE TABLET BY MOUTH TWICE A DAY 60 tablet 3   furosemide (LASIX) 80 MG tablet Take 1 tablet (80 mg total) by mouth daily. 90 tablet 3   methimazole (TAPAZOLE) 5 MG tablet Take 1 tablet (5 mg total) by mouth daily. 30 tablet 2   Multiple Vitamin (MULTIVITAMIN) tablet Take 1 tablet by mouth daily.     spironolactone (ALDACTONE) 25 MG tablet Take 1 tablet (25 mg total) by mouth daily. 90 tablet 3   No current facility-administered  medications for this visit.   No Known Allergies  Social History   Socioeconomic History   Marital status: Married    Spouse name: Not on file   Number of children: Not on file   Years of education: Not on file   Highest education level: Not on file  Occupational History   Not on file  Tobacco Use   Smoking status: Former    Years: 6.50    Types: Cigarettes, Cigars, Pipe    Quit date: 03/14/1974    Years since quitting: 46.9   Smokeless tobacco: Never  Vaping Use   Vaping Use: Never used  Substance and Sexual Activity   Alcohol use: Yes    Alcohol/week: 0.0 standard drinks    Comment: 09/26/2017 "holidays only"   Drug use: Never   Sexual activity: Yes  Other Topics Concern   Not on file  Social History Narrative   Not on file   Social Determinants of Health   Financial Resource Strain: Not on file  Food Insecurity: Not on file  Transportation Needs: Not on file  Physical Activity: Not on file  Stress: Not on file  Social Connections: Not on file  Intimate Partner Violence: Not on file   Family History  Problem Relation Age of Onset   Parkinson's disease Mother    Hypertension Father    There were no vitals filed for this visit.  Wt Readings from Last 3 Encounters:  12/02/20 105.8 kg (233 lb 4.8 oz)  11/25/20 106.7 kg (235 lb 3.2 oz)  07/08/20 107.9 kg (237 lb 12.8 oz)   PHYSICAL EXAM: General: NAD Neck: No JVD, no thyromegaly or thyroid nodule.  Lungs: Clear to auscultation bilaterally with normal respiratory effort. CV: Nondisplaced PMI.  Heart regular S1/S2, no S3/S4, no murmur.  No peripheral edema.  No carotid bruit.  Normal pedal pulses.  Abdomen: Soft, nontender, no hepatosplenomegaly, no distention.  Skin: Intact without lesions or rashes.  Neurologic: Alert and oriented x 3.  Psych: Normal affect. Extremities: No clubbing or cyanosis.  HEENT: Normal.   ASSESSMENT & PLAN:  1. Chronic systolic CHF: Long history of nonischemic cardiomyopathy.   He does not have an ICD due to metastatic lung cancer and untreated prostate cancer.  Echo in 3/22 with EF 25%.  NYHA class I-II symptoms.  He is not volume overloaded on exam. Weight down 3 lbs.  - Continue Farxiga 10 mg daily.  - Continue Entresto 24/26 mg BID.  - Continue Coreg 6.25 mg bid.  - Increase spironolactone to 25 mg daily with BMET today and in 10 days.  - Continue uptitration of GDMT, pharmacy appt 3 wks.     - ICD not planned due to metastatic cancer diagnosis though he is doing well on therapy with alectinib.   2. Lung cancer: Stage IV with liver mets, adenocarcinoma.  Good response so far with alectinib.  - Follows with Dr Burr Medico.   3. Atrial fibrillation:  Paroxysmal. He is in NSR today.  - Continue amiodarone 100 mg daily. Check LFTs and TSH today.  He will need regular eye exam.  - Continue Eliquis 5 mg BID.  4. CKD: Stage 3.  - BMET today.  5. Prostate cancer: Does not follow with urology.  6. 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  He is on Eliquis.   Followup with HF pharmacist in 3 wks for med titration, followup with APP in 2 months.   [place], FNP 02/16/21

## 2021-02-19 ENCOUNTER — Encounter (HOSPITAL_COMMUNITY): Payer: Medicare Other

## 2021-02-24 ENCOUNTER — Ambulatory Visit: Payer: Medicare Other | Admitting: Internal Medicine

## 2021-02-24 NOTE — Progress Notes (Deleted)
Name: Logan [name]  MRN/ DOB: [id number], 1950-08-18    Age/ Sex: 70 y.o., male    PCP: [name], MD   Reason for Endocrinology Evaluation: Hyperthyroidism     Date of Initial Endocrinology Evaluation: 02/24/2021     HPI: Logan [name] is a 70 y.o. male with a past medical history of CHF, MR, A.Fib , Hx of CVA, Non - small cell lung ca. And Hx of prostate cancer. The patient presented for initial endocrinology clinic visit on 02/24/2021 for consultative assistance with his Hyperthyroidism.   Pt was diagnosed with hyperthyroidism in 11/2020 with a TSH of 0.062 uIU/mL , repeat TSH confirmed a TSh of 0.051 uIU/mL with elevated FT4 at 1.48 ng/dL     HISTORY:  Past Medical History:  Past Medical History:  Diagnosis Date   CHF (congestive heart failure) ([place])    Chronic systolic heart failure ([place]) 04/15/2015    EF 30-35% by echo 2016 , [facility]    Hypertension    Lung mass 04/15/2015    Never biopsied. History of PET scan that raised concern.    Metabolic syndrome 05/20/1015   OSA on CPAP    Paroxysmal atrial fibrillation ([facility]) 04/15/2015   On apixaban    Prostate cancer ([place])    Thrombocytopenia ([place]) 04/15/2015   Past Surgical History:  Past Surgical History:  Procedure Laterality Date   CARDIAC CATHETERIZATION N/A 04/27/2015   Procedure: Right/Left Heart Cath and Coronary Angiography;  Surgeon: [name], MD;  Location: [facility];  Service: Cardiovascular;  Laterality: N/A;   CARDIOVERSION N/A 09/29/2017   Procedure: CARDIOVERSION;  Surgeon: Pixie Casino, MD;  Location: [place];  Service: Cardiovascular;  Laterality: N/A;   IR THORACENTESIS ASP PLEURAL SPACE W/IMG GUIDE  09/27/2017   PROSTATE BIOPSY     TEE WITHOUT CARDIOVERSION N/A 09/29/2017   Procedure: TRANSESOPHAGEAL ECHOCARDIOGRAM (TEE);  Surgeon: Pixie Casino, MD;  Location: [facility];  Service: Cardiovascular;  Laterality: N/A;    Social History:  reports that he quit  smoking about 46 years ago. His smoking use included cigarettes, cigars, and pipe. He has never used smokeless tobacco. He reports current alcohol use. He reports that he does not use drugs. Family History: family history includes Hypertension in his father; Parkinson's disease in his mother.   HOME MEDICATIONS: Allergies as of 02/24/2021   No Known Allergies      Medication List        Accurate as of February 24, 2021  7:09 AM. If you have any questions, ask your nurse or doctor.          Alecensa 150 MG capsule Generic drug: alectinib TAKE 4 CAPSULES BY MOUTH TWICE A DAY WITH A MEAL   apixaban 5 MG Tabs tablet Commonly known as: Eliquis Take 1 tablet (5 mg total) by mouth 2 (two) times daily.   carvedilol 6.25 MG tablet Commonly known as: COREG TAKE 1 TABLET TWICE DAILY WITH MEALS   Cholecalciferol 125 MCG (5000 UT) capsule Take 5,000 Units by mouth 2 (two) times daily.   dapagliflozin propanediol 10 MG Tabs tablet Commonly known as: Farxiga Take 1 tablet (10 mg total) by mouth daily before breakfast.   Entresto 24-26 MG Generic drug: sacubitril-valsartan TAKE ONE TABLET BY MOUTH TWICE A DAY   furosemide 80 MG tablet Commonly known as: LASIX Take 1 tablet (80 mg total) by mouth daily.   methimazole 5 MG tablet Commonly known as: TAPAZOLE Take 1  tablet (5 mg total) by mouth daily.   multivitamin tablet Take 1 tablet by mouth daily.   spironolactone 25 MG tablet Commonly known as: ALDACTONE Take 1 tablet (25 mg total) by mouth daily.          REVIEW OF SYSTEMS: A comprehensive ROS was conducted with the patient and is negative except as per HPI and below:  ROS     OBJECTIVE:  VS: There were no vitals taken for this visit.   Wt Readings from Last 3 Encounters:  12/02/20 233 lb 4.8 oz (105.8 kg)  11/25/20 235 lb 3.2 oz (106.7 kg)  07/08/20 237 lb 12.8 oz (107.9 kg)     EXAM: General: Pt appears well and is in NAD  Hydration: Well-hydrated  with moist mucous membranes and good skin turgor  Eyes: External eye exam normal without stare, lid lag or exophthalmos.  EOM intact.  PERRL.  Ears, Nose, Throat: Hearing: Grossly intact bilaterally Dental: Good dentition  Throat: Clear without mass, erythema or exudate  Neck: General: Supple without adenopathy. Thyroid: Thyroid size normal.  No goiter or nodules appreciated. No thyroid bruit.  Lungs: Clear with good BS bilat with no rales, rhonchi, or wheezes  Heart: Auscultation: RRR.  Abdomen: Normoactive bowel sounds, soft, nontender, without masses or organomegaly palpable  Extremities: Gait and station: Normal gait  Digits and nails: No clubbing, cyanosis, petechiae, or nodes Head and neck: Normal alignment and mobility BL UE: Normal ROM and strength. BL LE: No pretibial edema normal ROM and strength.  Skin: Hair: Texture and amount normal with gender appropriate distribution Skin Inspection: No rashes, acanthosis nigricans/skin tags. No lipohypertrophy Skin Palpation: Skin temperature, texture, and thickness normal to palpation  Neuro: Cranial nerves: II - XII grossly intact  Cerebellar: Normal coordination and movement; no tremor Motor: Normal strength throughout DTRs: 2+ and symmetric in UE without delay in relaxation phase  Mental Status: Judgment, insight: Intact Orientation: Oriented to time, place, and person Memory: Intact for recent and remote events Mood and affect: No depression, anxiety, or agitation     DATA REVIEWED: ***    ASSESSMENT/PLAN/RECOMMENDATIONS:   ***    Medications :  Signed electronically by: Mack Guise, MD  [facility]  [facility] [address] [place], [place] [address] Phone: [phone] FAX: [phone]   CC: [name], MD [place] [address] Phone: [phone] Fax: [phone]   Return to Endocrinology clinic as below: Future  Appointments  Date Time Provider [place]  02/24/2021  8:30 AM Keniah Klemmer, Melanie Crazier, MD [facility] None  03/01/2021 11:00 AM [facility] None  03/01/2021 11:40 AM Truitt Merle, MD [facility] None  04/07/2021  2:00 PM [facility] PA/NP [facility] None

## 2021-03-01 ENCOUNTER — Inpatient Hospital Stay: Payer: Medicare Other | Attending: Hematology | Admitting: Hematology

## 2021-03-01 ENCOUNTER — Inpatient Hospital Stay: Payer: Medicare Other

## 2021-03-02 ENCOUNTER — Telehealth: Payer: Self-pay | Admitting: Hematology

## 2021-03-02 NOTE — Telephone Encounter (Signed)
Scheduled per sch msg. Called and spoke with patient. Patient request appt be end of January or early feb. Confirmed date and time

## 2021-03-04 ENCOUNTER — Telehealth (HOSPITAL_COMMUNITY): Payer: Self-pay | Admitting: Cardiology

## 2021-03-04 NOTE — Telephone Encounter (Signed)
Pt request Entresto refill, please send script to [facility]. Thanks

## 2021-03-11 ENCOUNTER — Other Ambulatory Visit (HOSPITAL_COMMUNITY): Payer: Self-pay | Admitting: *Deleted

## 2021-03-11 MED ORDER — ENTRESTO 24-26 MG PO TABS: 1.0000 | ORAL_TABLET | Freq: Two times a day (BID) | ORAL | 3 refills | Status: AC

## 2021-03-12 ENCOUNTER — Ambulatory Visit: Payer: Medicare Other | Admitting: Hematology

## 2021-03-12 ENCOUNTER — Other Ambulatory Visit: Payer: Medicare Other

## 2021-04-05 ENCOUNTER — Encounter: Payer: Self-pay | Admitting: Internal Medicine

## 2021-04-06 ENCOUNTER — Other Ambulatory Visit: Payer: Self-pay

## 2021-04-07 ENCOUNTER — Ambulatory Visit (HOSPITAL_COMMUNITY): Payer: Medicare Other

## 2021-04-07 ENCOUNTER — Encounter (HOSPITAL_COMMUNITY): Payer: Medicare Other

## 2021-04-14 ENCOUNTER — Inpatient Hospital Stay: Payer: Medicare Other | Admitting: Hematology

## 2021-04-14 ENCOUNTER — Inpatient Hospital Stay: Payer: Medicare Other

## 2021-04-14 DEATH — deceased
